# Patient Record
Sex: Male | Born: 1940
Health system: Southern US, Community
[De-identification: ages and names within clinical notes are randomized; demographics above are authoritative.]

## PROBLEM LIST (undated history)

## (undated) DIAGNOSIS — C19 Malignant neoplasm of rectosigmoid junction: Secondary | ICD-10-CM

## (undated) DIAGNOSIS — E7800 Pure hypercholesterolemia, unspecified: Secondary | ICD-10-CM

## (undated) DIAGNOSIS — C189 Malignant neoplasm of colon, unspecified: Secondary | ICD-10-CM

## (undated) DIAGNOSIS — C9110 Chronic lymphocytic leukemia of B-cell type not having achieved remission: Secondary | ICD-10-CM

## (undated) DIAGNOSIS — J189 Pneumonia, unspecified organism: Secondary | ICD-10-CM

## (undated) DIAGNOSIS — I10 Essential (primary) hypertension: Secondary | ICD-10-CM

## (undated) HISTORY — PX: HEMICOLECTOMY: SHX854

## (undated) HISTORY — DX: Pneumonia, unspecified organism: J18.9

## (undated) HISTORY — DX: Pure hypercholesterolemia, unspecified: E78.00

## (undated) HISTORY — PX: OTHER SURGICAL HISTORY: SHX169

## (undated) HISTORY — DX: Essential (primary) hypertension: I10

## (undated) HISTORY — DX: Malignant neoplasm of colon, unspecified: C18.9

---

## 2007-11-17 ENCOUNTER — Encounter: Admission: RE | Admit: 2007-11-17 | Discharge: 2007-11-17 | Payer: Self-pay | Admitting: Gastroenterology

## 2007-11-23 ENCOUNTER — Ambulatory Visit (HOSPITAL_COMMUNITY): Admission: RE | Admit: 2007-11-23 | Discharge: 2007-11-23 | Payer: Self-pay | Admitting: Gastroenterology

## 2007-11-26 ENCOUNTER — Inpatient Hospital Stay (HOSPITAL_COMMUNITY): Admission: EM | Admit: 2007-11-26 | Discharge: 2007-11-29 | Payer: Self-pay | Admitting: Emergency Medicine

## 2007-12-19 ENCOUNTER — Ambulatory Visit (HOSPITAL_COMMUNITY): Admission: RE | Admit: 2007-12-19 | Discharge: 2007-12-19 | Payer: Self-pay | Admitting: Family Medicine

## 2008-01-09 ENCOUNTER — Inpatient Hospital Stay (HOSPITAL_COMMUNITY): Admission: RE | Admit: 2008-01-09 | Discharge: 2008-01-13 | Payer: Self-pay | Admitting: General Surgery

## 2008-01-09 ENCOUNTER — Encounter (INDEPENDENT_AMBULATORY_CARE_PROVIDER_SITE_OTHER): Payer: Self-pay | Admitting: General Surgery

## 2008-01-16 ENCOUNTER — Ambulatory Visit: Payer: Self-pay | Admitting: Hematology and Oncology

## 2008-02-02 LAB — COMPREHENSIVE METABOLIC PANEL
ALT: 11 U/L (ref 0–53)
AST: 12 U/L (ref 0–37)
Albumin: 4.4 g/dL (ref 3.5–5.2)
Alkaline Phosphatase: 62 U/L (ref 39–117)
BUN: 13 mg/dL (ref 6–23)
CO2: 26 mEq/L (ref 19–32)
Calcium: 9.1 mg/dL (ref 8.4–10.5)
Chloride: 107 mEq/L (ref 96–112)
Creatinine, Ser: 0.93 mg/dL (ref 0.40–1.50)
Glucose, Bld: 105 mg/dL — ABNORMAL HIGH (ref 70–99)
Potassium: 4.1 mEq/L (ref 3.5–5.3)
Sodium: 142 mEq/L (ref 135–145)
Total Bilirubin: 1.3 mg/dL — ABNORMAL HIGH (ref 0.3–1.2)
Total Protein: 6.8 g/dL (ref 6.0–8.3)

## 2008-02-02 LAB — CBC WITH DIFFERENTIAL/PLATELET
BASO%: 0.2 % (ref 0.0–2.0)
Basophils Absolute: 0 10*3/uL (ref 0.0–0.1)
EOS%: 3.1 % (ref 0.0–7.0)
Eosinophils Absolute: 0.1 10*3/uL (ref 0.0–0.5)
HCT: 38.4 % — ABNORMAL LOW (ref 38.7–49.9)
HGB: 13.3 g/dL (ref 13.0–17.1)
LYMPH%: 43 % (ref 14.0–48.0)
MCH: 29.1 pg (ref 28.0–33.4)
MCHC: 34.6 g/dL (ref 32.0–35.9)
MCV: 84.1 fL (ref 81.6–98.0)
MONO#: 0.5 10*3/uL (ref 0.1–0.9)
MONO%: 9.8 % (ref 0.0–13.0)
NEUT#: 2 10*3/uL (ref 1.5–6.5)
NEUT%: 43.9 % (ref 40.0–75.0)
Platelets: 124 10*3/uL — ABNORMAL LOW (ref 145–400)
RBC: 4.56 10*6/uL (ref 4.20–5.71)
RDW: 13.7 % (ref 11.2–14.6)
WBC: 4.6 10*3/uL (ref 4.0–10.0)
lymph#: 2 10*3/uL (ref 0.9–3.3)

## 2008-02-02 LAB — CEA: CEA: 0.8 ng/mL (ref 0.0–5.0)

## 2008-07-30 ENCOUNTER — Ambulatory Visit: Payer: Self-pay | Admitting: Hematology and Oncology

## 2008-08-01 ENCOUNTER — Ambulatory Visit (HOSPITAL_COMMUNITY): Admission: RE | Admit: 2008-08-01 | Discharge: 2008-08-01 | Payer: Self-pay | Admitting: Hematology and Oncology

## 2008-08-01 LAB — CBC WITH DIFFERENTIAL/PLATELET
BASO%: 1.1 % (ref 0.0–2.0)
Basophils Absolute: 0.1 10*3/uL (ref 0.0–0.1)
EOS%: 1.9 % (ref 0.0–7.0)
Eosinophils Absolute: 0.1 10*3/uL (ref 0.0–0.5)
HCT: 42.7 % (ref 38.7–49.9)
HGB: 14.6 g/dL (ref 13.0–17.1)
LYMPH%: 53.2 % — ABNORMAL HIGH (ref 14.0–48.0)
MCH: 29.4 pg (ref 28.0–33.4)
MCHC: 34.3 g/dL (ref 32.0–35.9)
MCV: 85.9 fL (ref 81.6–98.0)
MONO#: 0.4 10*3/uL (ref 0.1–0.9)
MONO%: 6.6 % (ref 0.0–13.0)
NEUT#: 2.3 10*3/uL (ref 1.5–6.5)
NEUT%: 37.2 % — ABNORMAL LOW (ref 40.0–75.0)
Platelets: 154 10*3/uL (ref 145–400)
RBC: 4.97 10*6/uL (ref 4.20–5.71)
RDW: 13.9 % (ref 11.2–14.6)
WBC: 6.3 10*3/uL (ref 4.0–10.0)
lymph#: 3.3 10*3/uL (ref 0.9–3.3)

## 2008-08-01 LAB — COMPREHENSIVE METABOLIC PANEL
ALT: 16 U/L (ref 0–53)
AST: 21 U/L (ref 0–37)
Albumin: 4.3 g/dL (ref 3.5–5.2)
Alkaline Phosphatase: 67 U/L (ref 39–117)
BUN: 15 mg/dL (ref 6–23)
CO2: 31 mEq/L (ref 19–32)
Calcium: 9.8 mg/dL (ref 8.4–10.5)
Chloride: 103 mEq/L (ref 96–112)
Creatinine, Ser: 1.2 mg/dL (ref 0.40–1.50)
Glucose, Bld: 103 mg/dL — ABNORMAL HIGH (ref 70–99)
Potassium: 4.4 mEq/L (ref 3.5–5.3)
Sodium: 140 mEq/L (ref 135–145)
Total Bilirubin: 1.4 mg/dL — ABNORMAL HIGH (ref 0.3–1.2)
Total Protein: 7.2 g/dL (ref 6.0–8.3)

## 2008-08-01 LAB — CEA: CEA: 1.3 ng/mL (ref 0.0–5.0)

## 2009-01-28 ENCOUNTER — Ambulatory Visit: Payer: Self-pay | Admitting: Hematology and Oncology

## 2009-01-30 ENCOUNTER — Ambulatory Visit (HOSPITAL_COMMUNITY): Admission: RE | Admit: 2009-01-30 | Discharge: 2009-01-30 | Payer: Self-pay | Admitting: Hematology and Oncology

## 2009-01-30 LAB — CBC WITH DIFFERENTIAL/PLATELET
BASO%: 0.6 % (ref 0.0–2.0)
Basophils Absolute: 0 10*3/uL (ref 0.0–0.1)
EOS%: 3.4 % (ref 0.0–7.0)
Eosinophils Absolute: 0.2 10*3/uL (ref 0.0–0.5)
HCT: 41.8 % (ref 38.4–49.9)
HGB: 14.2 g/dL (ref 13.0–17.1)
LYMPH%: 45.5 % (ref 14.0–49.0)
MCH: 29.8 pg (ref 27.2–33.4)
MCHC: 33.9 g/dL (ref 32.0–36.0)
MCV: 87.9 fL (ref 79.3–98.0)
MONO#: 0.4 10*3/uL (ref 0.1–0.9)
MONO%: 7 % (ref 0.0–14.0)
NEUT#: 2.7 10*3/uL (ref 1.5–6.5)
NEUT%: 43.5 % (ref 39.0–75.0)
Platelets: 118 10*3/uL — ABNORMAL LOW (ref 140–400)
RBC: 4.76 10*6/uL (ref 4.20–5.82)
RDW: 13.4 % (ref 11.0–14.6)
WBC: 6.1 10*3/uL (ref 4.0–10.3)
lymph#: 2.8 10*3/uL (ref 0.9–3.3)

## 2009-01-30 LAB — CEA: CEA: 1 ng/mL (ref 0.0–5.0)

## 2009-01-30 LAB — COMPREHENSIVE METABOLIC PANEL
ALT: 16 U/L (ref 0–53)
AST: 19 U/L (ref 0–37)
Albumin: 4.3 g/dL (ref 3.5–5.2)
Alkaline Phosphatase: 62 U/L (ref 39–117)
BUN: 19 mg/dL (ref 6–23)
CO2: 29 mEq/L (ref 19–32)
Calcium: 9.3 mg/dL (ref 8.4–10.5)
Chloride: 103 mEq/L (ref 96–112)
Creatinine, Ser: 1.03 mg/dL (ref 0.40–1.50)
Glucose, Bld: 132 mg/dL — ABNORMAL HIGH (ref 70–99)
Potassium: 4 mEq/L (ref 3.5–5.3)
Sodium: 139 mEq/L (ref 135–145)
Total Bilirubin: 1.4 mg/dL — ABNORMAL HIGH (ref 0.3–1.2)
Total Protein: 7.1 g/dL (ref 6.0–8.3)

## 2009-05-06 ENCOUNTER — Ambulatory Visit: Payer: Self-pay | Admitting: Hematology and Oncology

## 2009-05-08 LAB — CBC WITH DIFFERENTIAL/PLATELET
BASO%: 0.2 % (ref 0.0–2.0)
Basophils Absolute: 0 10*3/uL (ref 0.0–0.1)
EOS%: 3 % (ref 0.0–7.0)
Eosinophils Absolute: 0.2 10*3/uL (ref 0.0–0.5)
HCT: 40.2 % (ref 38.4–49.9)
HGB: 13.7 g/dL (ref 13.0–17.1)
LYMPH%: 59.7 % — ABNORMAL HIGH (ref 14.0–49.0)
MCH: 30 pg (ref 27.2–33.4)
MCHC: 34.2 g/dL (ref 32.0–36.0)
MCV: 87.9 fL (ref 79.3–98.0)
MONO#: 0.5 10*3/uL (ref 0.1–0.9)
MONO%: 8.3 % (ref 0.0–14.0)
NEUT#: 1.9 10*3/uL (ref 1.5–6.5)
NEUT%: 28.8 % — ABNORMAL LOW (ref 39.0–75.0)
Platelets: 93 10*3/uL — ABNORMAL LOW (ref 140–400)
RBC: 4.57 10*6/uL (ref 4.20–5.82)
RDW: 14.1 % (ref 11.0–14.6)
WBC: 6.6 10*3/uL (ref 4.0–10.3)
lymph#: 3.9 10*3/uL — ABNORMAL HIGH (ref 0.9–3.3)

## 2009-05-08 LAB — TECHNOLOGIST REVIEW

## 2009-05-16 ENCOUNTER — Encounter: Payer: Self-pay | Admitting: Hematology and Oncology

## 2009-05-16 ENCOUNTER — Ambulatory Visit (HOSPITAL_COMMUNITY): Admission: RE | Admit: 2009-05-16 | Discharge: 2009-05-16 | Payer: Self-pay | Admitting: Hematology and Oncology

## 2009-05-16 LAB — COMPREHENSIVE METABOLIC PANEL
ALT: 16 U/L (ref 0–53)
AST: 20 U/L (ref 0–37)
Albumin: 4.4 g/dL (ref 3.5–5.2)
Alkaline Phosphatase: 58 U/L (ref 39–117)
BUN: 15 mg/dL (ref 6–23)
CO2: 29 mEq/L (ref 19–32)
Calcium: 9.4 mg/dL (ref 8.4–10.5)
Chloride: 103 mEq/L (ref 96–112)
Creatinine, Ser: 1.11 mg/dL (ref 0.40–1.50)
Glucose, Bld: 95 mg/dL (ref 70–99)
Potassium: 4 mEq/L (ref 3.5–5.3)
Sodium: 140 mEq/L (ref 135–145)
Total Bilirubin: 1.5 mg/dL — ABNORMAL HIGH (ref 0.3–1.2)
Total Protein: 6.9 g/dL (ref 6.0–8.3)

## 2009-05-16 LAB — LACTATE DEHYDROGENASE: LDH: 166 U/L (ref 94–250)

## 2009-05-20 LAB — VITAMIN B12: Vitamin B-12: 556 pg/mL (ref 211–911)

## 2009-05-20 LAB — PROTEIN ELECTROPHORESIS, SERUM
Albumin ELP: 63.3 % (ref 55.8–66.1)
Alpha-1-Globulin: 3.4 % (ref 2.9–4.9)
Alpha-2-Globulin: 8.2 % (ref 7.1–11.8)
Beta 2: 3.6 % (ref 3.2–6.5)
Beta Globulin: 6.2 % (ref 4.7–7.2)
Gamma Globulin: 15.3 % (ref 11.1–18.8)
Total Protein, Serum Electrophoresis: 6.8 g/dL (ref 6.0–8.3)

## 2009-05-20 LAB — FOLATE: Folate: >20 ng/mL

## 2009-05-27 LAB — FLOW CYTOMETRY

## 2009-05-28 LAB — CBC WITH DIFFERENTIAL/PLATELET
BASO%: 0.4 % (ref 0.0–2.0)
Basophils Absolute: 0 10*3/uL (ref 0.0–0.1)
EOS%: 3.4 % (ref 0.0–7.0)
Eosinophils Absolute: 0.2 10*3/uL (ref 0.0–0.5)
HCT: 40.9 % (ref 38.4–49.9)
HGB: 14 g/dL (ref 13.0–17.1)
LYMPH%: 39.8 % (ref 14.0–49.0)
MCH: 29.8 pg (ref 27.2–33.4)
MCHC: 34.2 g/dL (ref 32.0–36.0)
MCV: 87.2 fL (ref 79.3–98.0)
MONO#: 0.6 10*3/uL (ref 0.1–0.9)
MONO%: 9.4 % (ref 0.0–14.0)
NEUT#: 2.8 10*3/uL (ref 1.5–6.5)
NEUT%: 47 % (ref 39.0–75.0)
Platelets: 124 10*3/uL — ABNORMAL LOW (ref 140–400)
RBC: 4.69 10*6/uL (ref 4.20–5.82)
RDW: 13.6 % (ref 11.0–14.6)
WBC: 6 10*3/uL (ref 4.0–10.3)
lymph#: 2.4 10*3/uL (ref 0.9–3.3)

## 2009-05-28 LAB — CEA: CEA: 1.6 ng/mL (ref 0.0–5.0)

## 2009-11-18 ENCOUNTER — Ambulatory Visit: Payer: Self-pay | Admitting: Hematology and Oncology

## 2009-11-20 ENCOUNTER — Ambulatory Visit (HOSPITAL_COMMUNITY): Admission: RE | Admit: 2009-11-20 | Discharge: 2009-11-20 | Payer: Self-pay | Admitting: Hematology and Oncology

## 2009-11-20 LAB — COMPREHENSIVE METABOLIC PANEL
ALT: 22 U/L (ref 0–53)
AST: 33 U/L (ref 0–37)
Albumin: 4.7 g/dL (ref 3.5–5.2)
Alkaline Phosphatase: 62 U/L (ref 39–117)
BUN: 12 mg/dL (ref 6–23)
CO2: 31 mEq/L (ref 19–32)
Calcium: 9.4 mg/dL (ref 8.4–10.5)
Chloride: 104 mEq/L (ref 96–112)
Creatinine, Ser: 1.13 mg/dL (ref 0.40–1.50)
Glucose, Bld: 94 mg/dL (ref 70–99)
Potassium: 3.5 mEq/L (ref 3.5–5.3)
Sodium: 140 mEq/L (ref 135–145)
Total Bilirubin: 1.2 mg/dL (ref 0.3–1.2)
Total Protein: 7.3 g/dL (ref 6.0–8.3)

## 2009-11-20 LAB — CBC WITH DIFFERENTIAL/PLATELET
BASO%: 0.2 % (ref 0.0–2.0)
Basophils Absolute: 0 10*3/uL (ref 0.0–0.1)
EOS%: 3.2 % (ref 0.0–7.0)
Eosinophils Absolute: 0.2 10*3/uL (ref 0.0–0.5)
HCT: 41.2 % (ref 38.4–49.9)
HGB: 14.2 g/dL (ref 13.0–17.1)
LYMPH%: 54.8 % — ABNORMAL HIGH (ref 14.0–49.0)
MCH: 30.4 pg (ref 27.2–33.4)
MCHC: 34.4 g/dL (ref 32.0–36.0)
MCV: 88.5 fL (ref 79.3–98.0)
MONO#: 0.5 10*3/uL (ref 0.1–0.9)
MONO%: 7.8 % (ref 0.0–14.0)
NEUT#: 2.3 10*3/uL (ref 1.5–6.5)
NEUT%: 34 % — ABNORMAL LOW (ref 39.0–75.0)
Platelets: 104 10*3/uL — ABNORMAL LOW (ref 140–400)
RBC: 4.66 10*6/uL (ref 4.20–5.82)
RDW: 14.3 % (ref 11.0–14.6)
WBC: 6.7 10*3/uL (ref 4.0–10.3)
lymph#: 3.6 10*3/uL — ABNORMAL HIGH (ref 0.9–3.3)

## 2009-11-20 LAB — CEA: CEA: 1.6 ng/mL (ref 0.0–5.0)

## 2009-11-28 ENCOUNTER — Other Ambulatory Visit: Admission: RE | Admit: 2009-11-28 | Discharge: 2009-11-28 | Payer: Self-pay | Admitting: Hematology and Oncology

## 2009-11-29 ENCOUNTER — Ambulatory Visit (HOSPITAL_COMMUNITY): Admission: RE | Admit: 2009-11-29 | Discharge: 2009-11-29 | Payer: Self-pay | Admitting: Hematology and Oncology

## 2009-12-02 LAB — SPEP & IFE WITH QIG
Albumin ELP: 63.1 % (ref 55.8–66.1)
Alpha-1-Globulin: 5.2 % — ABNORMAL HIGH (ref 2.9–4.9)
Alpha-2-Globulin: 7.4 % (ref 7.1–11.8)
Beta 2: 3 % — ABNORMAL LOW (ref 3.2–6.5)
Beta Globulin: 6.3 % (ref 4.7–7.2)
Gamma Globulin: 15 % (ref 11.1–18.8)
IgA: 138 mg/dL (ref 68–378)
IgG (Immunoglobin G), Serum: 1120 mg/dL (ref 694–1618)
IgM, Serum: 43 mg/dL — ABNORMAL LOW (ref 60–263)
Total Protein, Serum Electrophoresis: 7.2 g/dL (ref 6.0–8.3)

## 2009-12-02 LAB — VITAMIN B12: Vitamin B-12: 524 pg/mL (ref 211–911)

## 2009-12-02 LAB — DIRECT ANTIGLOBULIN TEST (NOT AT ARMC)
DAT (Complement): NEGATIVE
DAT IgG: NEGATIVE

## 2010-03-04 ENCOUNTER — Ambulatory Visit: Payer: Self-pay | Admitting: Hematology and Oncology

## 2010-03-06 ENCOUNTER — Ambulatory Visit (HOSPITAL_COMMUNITY): Admission: RE | Admit: 2010-03-06 | Discharge: 2010-03-06 | Payer: Self-pay | Admitting: Hematology and Oncology

## 2010-03-06 LAB — COMPREHENSIVE METABOLIC PANEL
ALT: 17 U/L (ref 0–53)
AST: 24 U/L (ref 0–37)
Albumin: 4.6 g/dL (ref 3.5–5.2)
Alkaline Phosphatase: 76 U/L (ref 39–117)
BUN: 14 mg/dL (ref 6–23)
CO2: 29 mEq/L (ref 19–32)
Calcium: 9.4 mg/dL (ref 8.4–10.5)
Chloride: 104 mEq/L (ref 96–112)
Creatinine, Ser: 1.15 mg/dL (ref 0.40–1.50)
Glucose, Bld: 114 mg/dL — ABNORMAL HIGH (ref 70–99)
Potassium: 4.3 mEq/L (ref 3.5–5.3)
Sodium: 138 mEq/L (ref 135–145)
Total Bilirubin: 1.4 mg/dL — ABNORMAL HIGH (ref 0.3–1.2)
Total Protein: 7.6 g/dL (ref 6.0–8.3)

## 2010-03-06 LAB — CBC WITH DIFFERENTIAL/PLATELET
BASO%: 0.1 % (ref 0.0–2.0)
Basophils Absolute: 0 10*3/uL (ref 0.0–0.1)
EOS%: 2.2 % (ref 0.0–7.0)
Eosinophils Absolute: 0.2 10*3/uL (ref 0.0–0.5)
HCT: 41.4 % (ref 38.4–49.9)
HGB: 13.9 g/dL (ref 13.0–17.1)
LYMPH%: 64.6 % — ABNORMAL HIGH (ref 14.0–49.0)
MCH: 29.5 pg (ref 27.2–33.4)
MCHC: 33.5 g/dL (ref 32.0–36.0)
MCV: 88 fL (ref 79.3–98.0)
MONO#: 0.6 10*3/uL (ref 0.1–0.9)
MONO%: 6 % (ref 0.0–14.0)
NEUT#: 2.5 10*3/uL (ref 1.5–6.5)
NEUT%: 27.1 % — ABNORMAL LOW (ref 39.0–75.0)
Platelets: 130 10*3/uL — ABNORMAL LOW (ref 140–400)
RBC: 4.7 10*6/uL (ref 4.20–5.82)
RDW: 14.3 % (ref 11.0–14.6)
WBC: 9.3 10*3/uL (ref 4.0–10.3)
lymph#: 6 10*3/uL — ABNORMAL HIGH (ref 0.9–3.3)

## 2010-03-06 LAB — LACTATE DEHYDROGENASE: LDH: 205 U/L (ref 94–250)

## 2010-03-06 LAB — URIC ACID: Uric Acid, Serum: 5.2 mg/dL (ref 4.0–7.8)

## 2010-06-12 ENCOUNTER — Ambulatory Visit: Payer: Self-pay | Admitting: Hematology and Oncology

## 2010-06-16 ENCOUNTER — Ambulatory Visit (HOSPITAL_COMMUNITY): Admission: RE | Admit: 2010-06-16 | Discharge: 2010-06-16 | Payer: Self-pay | Admitting: Hematology and Oncology

## 2010-06-16 LAB — CBC WITH DIFFERENTIAL/PLATELET
BASO%: 0.3 % (ref 0.0–2.0)
Basophils Absolute: 0 10*3/uL (ref 0.0–0.1)
EOS%: 2.5 % (ref 0.0–7.0)
Eosinophils Absolute: 0.3 10*3/uL (ref 0.0–0.5)
HCT: 44.1 % (ref 38.4–49.9)
HGB: 15 g/dL (ref 13.0–17.1)
LYMPH%: 67.7 % — ABNORMAL HIGH (ref 14.0–49.0)
MCH: 29.5 pg (ref 27.2–33.4)
MCHC: 34 g/dL (ref 32.0–36.0)
MCV: 86.8 fL (ref 79.3–98.0)
MONO#: 0.4 10*3/uL (ref 0.1–0.9)
MONO%: 3.9 % (ref 0.0–14.0)
NEUT#: 2.8 10*3/uL (ref 1.5–6.5)
NEUT%: 25.6 % — ABNORMAL LOW (ref 39.0–75.0)
Platelets: 106 10*3/uL — ABNORMAL LOW (ref 140–400)
RBC: 5.08 10*6/uL (ref 4.20–5.82)
RDW: 13.3 % (ref 11.0–14.6)
WBC: 10.9 10*3/uL — ABNORMAL HIGH (ref 4.0–10.3)
lymph#: 7.4 10*3/uL — ABNORMAL HIGH (ref 0.9–3.3)

## 2010-06-24 LAB — COMPREHENSIVE METABOLIC PANEL
ALT: 14 U/L (ref 0–53)
AST: 15 U/L (ref 0–37)
Albumin: 4.2 g/dL (ref 3.5–5.2)
Alkaline Phosphatase: 74 U/L (ref 39–117)
BUN: 14 mg/dL (ref 6–23)
CO2: 26 mEq/L (ref 19–32)
Calcium: 9.1 mg/dL (ref 8.4–10.5)
Chloride: 105 mEq/L (ref 96–112)
Creatinine, Ser: 1.02 mg/dL (ref 0.40–1.50)
Glucose, Bld: 108 mg/dL — ABNORMAL HIGH (ref 70–99)
Potassium: 4.2 mEq/L (ref 3.5–5.3)
Sodium: 140 mEq/L (ref 135–145)
Total Bilirubin: 1.1 mg/dL (ref 0.3–1.2)
Total Protein: 6.8 g/dL (ref 6.0–8.3)

## 2010-06-24 LAB — CBC WITH DIFFERENTIAL/PLATELET
BASO%: 0.6 % (ref 0.0–2.0)
Basophils Absolute: 0.1 10*3/uL (ref 0.0–0.1)
EOS%: 2.8 % (ref 0.0–7.0)
Eosinophils Absolute: 0.3 10*3/uL (ref 0.0–0.5)
HCT: 39.9 % (ref 38.4–49.9)
HGB: 13.6 g/dL (ref 13.0–17.1)
LYMPH%: 65.4 % — ABNORMAL HIGH (ref 14.0–49.0)
MCH: 29.5 pg (ref 27.2–33.4)
MCHC: 34.2 g/dL (ref 32.0–36.0)
MCV: 86.3 fL (ref 79.3–98.0)
MONO#: 0.7 10*3/uL (ref 0.1–0.9)
MONO%: 7.2 % (ref 0.0–14.0)
NEUT#: 2.2 10*3/uL (ref 1.5–6.5)
NEUT%: 24 % — ABNORMAL LOW (ref 39.0–75.0)
Platelets: 101 10*3/uL — ABNORMAL LOW (ref 140–400)
RBC: 4.62 10*6/uL (ref 4.20–5.82)
RDW: 13.4 % (ref 11.0–14.6)
WBC: 9.3 10*3/uL (ref 4.0–10.3)
lymph#: 6.1 10*3/uL — ABNORMAL HIGH (ref 0.9–3.3)

## 2010-06-24 LAB — IGG, IGA, IGM
IgA: 233 mg/dL (ref 68–378)
IgG (Immunoglobin G), Serum: 1370 mg/dL (ref 694–1618)
IgM, Serum: 63 mg/dL (ref 60–263)

## 2010-06-24 LAB — LACTATE DEHYDROGENASE: LDH: 171 U/L (ref 94–250)

## 2010-10-16 ENCOUNTER — Ambulatory Visit: Payer: Self-pay | Admitting: Hematology and Oncology

## 2010-10-19 ENCOUNTER — Encounter: Payer: Self-pay | Admitting: Hematology and Oncology

## 2010-10-20 LAB — CBC WITH DIFFERENTIAL/PLATELET
BASO%: 0.5 % (ref 0.0–2.0)
Basophils Absolute: 0.1 10*3/uL (ref 0.0–0.1)
EOS%: 1.2 % (ref 0.0–7.0)
Eosinophils Absolute: 0.2 10*3/uL (ref 0.0–0.5)
HCT: 42.8 % (ref 38.4–49.9)
HGB: 14.7 g/dL (ref 13.0–17.1)
LYMPH%: 70.5 % — ABNORMAL HIGH (ref 14.0–49.0)
MCH: 30 pg (ref 27.2–33.4)
MCHC: 34.3 g/dL (ref 32.0–36.0)
MCV: 87.4 fL (ref 79.3–98.0)
MONO#: 1.2 10*3/uL — ABNORMAL HIGH (ref 0.1–0.9)
MONO%: 7.7 % (ref 0.0–14.0)
NEUT#: 3.2 10*3/uL (ref 1.5–6.5)
NEUT%: 20.1 % — ABNORMAL LOW (ref 39.0–75.0)
Platelets: 102 10*3/uL — ABNORMAL LOW (ref 140–400)
RBC: 4.9 10*6/uL (ref 4.20–5.82)
RDW: 13.9 % (ref 11.0–14.6)
WBC: 15.8 10*3/uL — ABNORMAL HIGH (ref 4.0–10.3)
lymph#: 11.1 10*3/uL — ABNORMAL HIGH (ref 0.9–3.3)

## 2010-10-20 LAB — TECHNOLOGIST REVIEW

## 2010-10-21 LAB — COMPREHENSIVE METABOLIC PANEL
ALT: 11 U/L (ref 0–53)
AST: 18 U/L (ref 0–37)
Albumin: 4.8 g/dL (ref 3.5–5.2)
Alkaline Phosphatase: 79 U/L (ref 39–117)
BUN: 14 mg/dL (ref 6–23)
CO2: 24 mEq/L (ref 19–32)
Calcium: 9.6 mg/dL (ref 8.4–10.5)
Chloride: 105 mEq/L (ref 96–112)
Creatinine, Ser: 1.09 mg/dL (ref 0.40–1.50)
Glucose, Bld: 132 mg/dL — ABNORMAL HIGH (ref 70–99)
Potassium: 4.1 mEq/L (ref 3.5–5.3)
Sodium: 141 mEq/L (ref 135–145)
Total Bilirubin: 1.1 mg/dL (ref 0.3–1.2)
Total Protein: 7.5 g/dL (ref 6.0–8.3)

## 2010-10-21 LAB — LACTATE DEHYDROGENASE: LDH: 182 U/L (ref 94–250)

## 2010-10-22 ENCOUNTER — Other Ambulatory Visit: Payer: Self-pay | Admitting: Hematology and Oncology

## 2010-10-22 DIAGNOSIS — C189 Malignant neoplasm of colon, unspecified: Secondary | ICD-10-CM

## 2010-12-11 LAB — POCT I-STAT, CHEM 8
BUN: 16 mg/dL (ref 6–23)
Calcium, Ion: 1.19 mmol/L (ref 1.12–1.32)
Chloride: 102 mEq/L (ref 96–112)
Creatinine, Ser: 1.1 mg/dL (ref 0.4–1.5)
Glucose, Bld: 109 mg/dL — ABNORMAL HIGH (ref 70–99)
HCT: 48 % (ref 39.0–52.0)
Hemoglobin: 16.3 g/dL (ref 13.0–17.0)
Potassium: 4.1 mEq/L (ref 3.5–5.1)
Sodium: 141 mEq/L (ref 135–145)
TCO2: 29 mmol/L (ref 0–100)

## 2010-12-21 LAB — CHROMOSOME ANALYSIS, BONE MARROW

## 2010-12-21 LAB — TISSUE HYBRIDIZATION (BONE MARROW)-NCBH

## 2010-12-21 LAB — GLUCOSE, CAPILLARY: Glucose-Capillary: 113 mg/dL — ABNORMAL HIGH (ref 70–99)

## 2010-12-21 LAB — BONE MARROW EXAM

## 2011-02-10 NOTE — Discharge Summary (Signed)
[name], [name]             ACCOUNT NO.:  [id number]   MEDICAL RECORD NO.:  [id number]          PATIENT TYPE:  INP   LOCATION:  [id number]                         FACILITY:  [facility]   PHYSICIAN:  Sheila Oats, M.D.DATE OF BIRTH:  01/17/1941   DATE OF ADMISSION:  11/25/2007  DATE OF DISCHARGE:  11/29/2007                               DISCHARGE SUMMARY   DISCHARGE DIAGNOSES:  1. Bilateral pneumonia.  2. Hypokalemia.  3. Colon cancer, newly diagnosed, patient to follow up with oncologist      as scheduled.  4. Diabetes mellitus.  5. Hypertension.  6. Hypercholesterolemia.   PROCEDURES AND STUDIES:  CT angiogram of chest:  Extensive right basilar  air space disease most consistent with pneumonia.  Somewhat technically  limited study demonstrating no evidence of pulmonary embolus.   BRIEF HISTORY:  The patient is a 70 year old black male with the above-  listed medical problems, who presented with fevers, chills and decreased  energy levels.  He had an episode of nausea and vomiting on the day of  presentation.  In the emergency room his initial temperature was 102.5  with a blood pressure of 149/63, O2 saturations of 99%, white cell count  of 5with 83% neutrophils and a potassium of 3.4.  He admitted to a  nonproductive cough.  He denied chest pain.   Please see the full admission history and physical dictated on November 26, 2007, by Dr. Delfina Redwood for the details of the admission physical exam  as well as the laboratory data.   HOSPITAL COURSE:  1. Bilateral pneumonia.  Upon admission the patient had a chest x-ray,      which showed very low lung volumes causing vascular crowding and      mild right basilar atelectasis.  Given his history of cough along      with the fevers, he was empirically started on IV antibiotics for      pneumonia.  A D-dimer was also done and this was elevated at 0.55.      following this a CT angiogram of his chest was done to evaluate for  pulmonary embolus and the results, as stated above, revealed      bibasilar pneumonia and was negative for PE.  The patient was      placed on expectorants/antitussives and supplemental oxygen in the      hospital.  With the Mucinex his cough became productive, he      defervesced, and he has continued to have no leukocytosis.  His      symptoms have improved and he has remained hemodynamically stable      and oxygenating well on room air.  He will be discharged on oral      antibiotics and he is to follow up with his primary care physician.  2. Hypokalemia.  his potassium was replaced in the hospital.  3. Diabetes mellitus.  He is to continue his outpatient medications      upon discharge.  4. Hypertension.  He was maintained on his preadmission medications      and is to continue them upon discharge.  5. Hyperlipidemia.  The patient is to continue his outpatient      medications upon discharge.  6. Recent colon cancer diagnosis.  The patient is to follow up with      his oncologist upon discharge as scheduled.   DISCHARGE MEDICATIONS:  1. Avelox 400 mg 1 tablet daily.  2. Tussionex 5 mL every 12 hours as needed only for cough.  3. Mucinex-DM 2 tablets b.i.d.  4. Patient to continue metformin, Actos, amlodipine, Januvia, Vytorin      and aspirin.   FOLLOW-UP CARE:  1. Dr. [name] in 1-2 weeks.  2. Oncologist as scheduled.   DISCHARGE CONDITION:  Improved/stable.      Sheila Oats, M.D.  Electronically Signed     ACV/MEDQ  D:  11/29/2007  T:  11/29/2007  Job:  [id number]   cc:   [name] A. Alyson Ingles, M.D.  Fax: [phone]

## 2011-02-10 NOTE — Op Note (Signed)
NAME:  [name], [name]             ACCOUNT NO.:  [id number]   MEDICAL RECORD NO.:  [id number]          PATIENT TYPE:  INP   LOCATION:  0001                         FACILITY:  [facility]   PHYSICIAN:  Edsel Petrin. [name], M.D.DATE OF BIRTH:  08/15/1941   DATE OF PROCEDURE:  01/09/2008  DATE OF DISCHARGE:                               OPERATIVE REPORT   PREOPERATIVE DIAGNOSIS:  Carcinoma of the proximal rectum.   POSTOPERATIVE DIAGNOSIS:  Carcinoma of the rectosigmoid junction.   OPERATION PERFORMED:  Rigid proctoscopy, low anterior resection with  coloproctostomy (29 mm EEA stapler.)   SURGEON:  Fanny Skates, M.D.   FIRST ASSISTANT:  Alphonsa Overall, M.D.   OPERATIVE INDICATIONS:  This is a 70 year old black man who had routine  screening colonoscopy on November 07, 2007.  Dr. Oletta Lamas found a  noncircumferential mass with the lower edge of the mass reported at 18  cm and measuring 25 mm in diameter.  This was noncircumferential.  Biopsy showed adenocarcinoma.  A CT scan showed some enlarged small  bowel lymph nodes and some tiny liver spots, but a PET CT scan showed no  abnormal enhancement.  Unexpectedly the patient develops bilateral  pneumonia and was hospitalized on November 25, 2007 with fever.  CT  angiography of the chest was negative for pulmonary embolism.  He  recovered from that and was followed by Dr. [name] until his  pulmonary function normalized and his chest x-ray cleared.  Once this  was done, we scheduled him for surgery.  He has undergone  2-day bowel  prep at home and that went fairly well.  He has been counseled twice as  an outpatient and is in full agreement with the plan.   OPERATIVE FINDINGS:  Initial rigid proctoscopy showed a  noncircumferential neoplastic mass on the rectal wall at the right  anterior position.  This initially looked to be at about 15 cm.  Once we  did the laparotomy, the mass appeared somewhat higher well above the  peritoneal  reflection.  Once we were able to stretch the colon out  completely Dr. Lucia Gaskins performed a rigid proctoscopy which showed the  mass to be somewhere between 20 and 25 cm.  There were some small soft  lymph nodes throughout the mesentery, but nothing that I would  characterizes pathologic.  The liver felt normal.  The gallbladder felt  normal.  The omentum felt normal.  There was no ascites.   OPERATIVE TECHNIQUE:  Following the induction of general endotracheal  anesthesia, intravenous antibiotics were given.  The patient was  identified as the correct patient and the correct procedure.  The  patient underwent insertion of a Foley catheter.  He was then placed in  rigid stirrups.  Rigid proctoscopy was carried out and I felt the tumor  was at about 15 cm on the left anterior wall of the rectum.  We then  prepped and draped the abdomen and perineum.  Lower midline incision was  made.  The fascia was incised in the midline.  The abdomen was entered  and explored with findings as described above.  We  placed a silk suture  in the small palpable mass in the rectosigmoid junction.  To be sure  that we had not missed another lesion, Dr. Lucia Gaskins went below and  performed another rigid proctoscopy all the way up to the mass and with  the colon on stretch it was over 20 cm and almost 25 cm away.  There  were no other lesions.   We packed away the small bowel and placed self-retaining retractors.  We  mobilized the sigmoid colon by dividing its lateral peritoneal  attachments.  We clearly identified both ureters and these were  preserved.   We created a window behind the proximal rectum about 4 to 5 cm distal to  the tumor.  I placed a contour stapler with a blue staple load across  this, held it in place for about 20 seconds, then fired and removed it.  This made a nice transection.  We then went about 15 cm proximal to the  tumor to resect the redundancy in the sigmoid colon, but allow plenty  of  the colon to create an anastomosis without tension.  We divided the  colon about 15 cm or more proximal to the tumor with a GIA stapling  device.  Peritoneum was scored on both sides being careful to avoid the  ureters.  Larger vessels were clamped, divided and ligated doubly with 2-  0 silk ligatures.  Smaller mesenteric vessels were controlled with the  Ligasure.  The specimen was marked with a silk suture at the distal  margin and sent fresh to the lab.   Dr. Nicoletta Dress said that the distal margin was 3 cm.  She said that there was  not enough tissue for the Biobank study.   We took the proximal sigmoid colon and cut off the staple line.  We used  Allis clamps to open this up and using sizers we could fairly easily get  a 29 mm into it and a 33 mm was a little bit tight.  We brought a 29 mm  EEA stapler to the operative field and took the anvil off of this and  inserted it up into the proximal colon segment.  We then closed the  distal segment with the GIA stapling device.  We then found a suitable  point on the antimesenteric border of the colon and made a small  puncture wound with the cautery and brought the stem of the anvil out  through this.  We placed a pursestring suture of 2-0 Vicryl around this  to hold things in place and this looked like it was going to set up a  very nice Baker anastomosis.   Dr. Lucia Gaskins then went down below and inserted the stapler into the  rectum.  We positioned this so that it would come out somewhat anterior  to the rectal staple line.  He then opened the stapler and the pointed  stem of the stapler came through the rectal wall anterior to the staple  line.  We positioned this.  We very carefully looked at the proximal  segment to make sure there was no twisting.  We then inserted the  proximal colon segment with the anvil onto the stem of the stapler and  then slowly closed the stapler, fired the stapler, opened it and then  removed it.  We had two  complete donuts of tissue.  The anastomosis  looked good.  Dr. Lucia Gaskins performed a proctoscopy and insufflated the  rectosigmoid colon under tension.  There was no air leak.  He could see  the staple line.  There was no bleeding.  All the air was evacuated.   We changed our instruments and gloves and suction devices at this point.  We irrigated out the lower abdomen and pelvis with about 3 liters of  saline.  There was really no bleeding or contamination.  We very  carefully closed the peritoneum on the left lateral side to avoid  internal herniation.  We did this with interrupted sutures of 3-0  Vicryl.  The colon lay in good position without any tension whatsoever.  Both the proximal and distal segments looked pink and healthy.  They had  bled well during the procedure.  There was no bleeding at the completion  of the procedure.  The packs and instruments were removed.  The small  bowel and omentum were returned to their anatomic positions.  The  midline fascia was closed with a running suture of #1, double-stranded  PDS.  After irrigating the subcutaneous tissue the skin was closed with  skin staples.  Clean bandages were placed and the patient taken to the  recovery room in stable condition.  Estimated blood loss was about 100  mL or less.   COMPLICATIONS:  None.   SPONGE AND COUNTS:  Correct.      Edsel Petrin. [name], M.D.  Electronically Signed     HMI/MEDQ  D:  01/09/2008  T:  01/09/2008  Job:  [id number]   cc:   [name] A. Alyson Ingles, M.D.  Fax: Struble. Rolla Flatten., M.D.  Fax: [phone]

## 2011-02-10 NOTE — H&P (Signed)
NAME:  EDNA, SPEARS             ACCOUNT NO.:  [id number]   MEDICAL RECORD NO.:  [id number]          PATIENT TYPE:  EMS   LOCATION:  ED                           FACILITY:  [facility]   PHYSICIAN:  Ashby Dawes. Polite, M.D. DATE OF BIRTH:  01-18-1941   DATE OF ADMISSION:  11/25/2007  DATE OF DISCHARGE:                              HISTORY & PHYSICAL   CHIEF COMPLAINT:  Nausea and vomiting x1, fever, chills.   HISTORY OF PRESENT ILLNESS:  A 70 year old male with known history of  diabetes, hypertension, high cholesterol, per patient recently diagnosed  with colon cancer at the time of his screening colonoscopy.  He recently  underwent CT of his abdomen and pelvis as well as PET scan because of  concern for prominent small bowel mesenteric lymph nodes.  The patient's  PET scan did not show evidence of hypermetabolic metastasis.  Enlargement of mesenteric lymph nodes did not exhibit significant FEG  uptake.  Over the last 2 days, the patient states he has been having  fever, chills, no energy.  He also has been exposed to a sick contact--  his wife who also had a very high temperature.  Today, he had an episode  of nausea and vomiting, and presented to the ED.  In the ED initially,  his temperature was 102.5, BP 149/63, pulse 148, respiratory rate 24,  sat 99%.  Subsequently, the temperature went as high as 103.4.  The  patient had a chest x-ray without definitive infiltrate.  He had labs.  He did not have leukocytosis.  WBC count was 5, 83% neutrophils, BMET  mild hypokalemia at 3.4, creatinine was unremarkable.  Urine showed  leukocyte esterase small.  Admission was deemed necessary for further  evaluation and treatment.  The patient denies any complaints of any  chest pain.  Denied any shortness of breath, but did have a rattle in  his chest.  He had a lot of cough, however, it was nonproductive.  He  states that he heard a rattle in the chest and then was coughing prior  to the episode of  emesis.  Denies any abdominal pain.   PAST MEDICAL HISTORY:  1. Significant for diabetes.  2. Hypertension.  3. High cholesterol.  4. Recent diagnosis of colon cancer on recent colonoscopy by Dr.      Oletta Lamas on November 07, 2007.  5. Recent CT of the abdomen and pelvis as well as PET scan on November 17, 2007 and November 23, 2007 respectively.   CURRENT MEDICATIONS:  1. Metformin 500 mg b.i.d.  2. Actos 30 mg a day.  3. Amlodipine 5 mg a day.  4. Januvia 100 mg a day.  5. Vytorin 10/40.  6. Aspirin 81 mg.   SOCIAL HISTORY:  Negative for tobacco, alcohol or drugs.   PAST SURGICAL HISTORY:  Surgery for nasal polyps.   ALLERGIES:  NONE.   FAMILY HISTORY:  Mother deceased.  She had end-stage renal disease and  trouble with her liver.  Father with diabetes.  Three brothers and one  sister, according to the patient healthy.  REVIEW OF SYSTEMS:  Stated in the HPI.   PHYSICAL EXAMINATION:  GENERAL:  The patient is alert and oriented in no  apparent distress.  VITAL SIGNS:  Current temperature 103.4, BP 125/49, pulse 144,  respiratory rate 22, sats 100%.  HEENT:  The patient does have somewhat injected sclerae on the right.  Moist mucosa.  NECK:  Supple.  No adenopathy.  CHEST:  Bilateral rhonchi, right greater than left.  CARDIOVASCULAR:  Tachy.  ABDOMEN:  Soft.  No distention.  No mass appreciated.  EXTREMITIES:  No edema.  NEURO:  Nonfocal.   ASSESSMENT:  1. Fever:  Temperature maximum of 103.4.  Probable influenza versus      pneumonia.  Current chest x-ray unremarkable.  However, lung exam      is abnormal.  2. Tachycardia:  Probably secondary to the patient's temperature.      Currently without hypotension or hypoxia.  3. Nausea and vomiting x1.  4. Diabetes.  5. Hypertension.  6. Hypercholesterolemia.  7. Recent diagnosis of colon cancer based on screening colonoscopy.   PLAN:  Recommend patient be admitted to a telemetry floor bed.  The  patient will be  treated with antibiotics, O2, nebs.  We will have a  follow up chest x-ray after IV fluids.  The patient will also have a  nasopharyngeal swab influenza.  We will make further recommendations  after review of the above studies.      Ashby Dawes. Polite, M.D.  Electronically Signed     RDP/MEDQ  D:  11/26/2007  T:  11/26/2007  Job:  [id number]

## 2011-02-10 NOTE — Discharge Summary (Signed)
NAME:  [name], [name]             ACCOUNT NO.:  [id number]   MEDICAL RECORD NO.:  [id number]          PATIENT TYPE:  INP   LOCATION:  60                         FACILITY:  [facility]   PHYSICIAN:  Edsel Petrin. [name], M.D.DATE OF BIRTH:  [birth year]   DATE OF ADMISSION:  01/09/2008  DATE OF DISCHARGE:  01/13/2008                               DISCHARGE SUMMARY   FINAL DIAGNOSES:  1. 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  2. Diabetes mellitus.  3. Hypertension.   OPERATION PERFORMED:  Low anterior resection of rectosigmoid cancer with  primary anastomosis.   DATE OF SURGERY:  January 09, 2008.   HISTORY:  This is a 70 year old man who had screening colonoscopy on  November 07, 2007.  Dr. Oletta Lamas found a noncircumferential mass with the  lower edge of the mass at 18 cm.  Dr. Oletta Lamas estimated this mass to be  25 mm in diameter and biopsy showed adenocarcinoma.  The CT scan showed  some enlarged small bowel lymph nodes and some tiny liver spots too  small to characterize.  A PET CT scan showed no abnormal enhancement.  The patient was counseled as an outpatient.  He underwent a 2 day bowel  prep at home and was brought to the hospital electively for surgery.   HOSPITAL COURSE:  On the day of admission the patient underwent a low  anterior resection of his proximal rectal cancer.  We were able to  widely resect this area and do a primary stapled anastomosis with an EEA  stapler.  We found no gross evidence of metastatic disease.  After the  anastomosis was done the rigid proctoscopy showed the anastomosis to be  at 15 cm or less.   Final pathology showed that this was a poorly differentiated  adenocarcinoma 1.7 cm in diameter.  Fifteen lymph nodes were examined  and all lymph nodes were negative.  This was staged at a T2, N0 tumor.  This was discussed with the patient and arrangements will be made for  medical oncology consultation as an  outpatient.   Postoperatively the patient actually did very well.  He was placed on  the Entereg protocol.  He had an ileus for a couple of days and then  began passing a little bit of blood and liquid stool.  He advanced his  diet and activities over the next 48 hours without much problem.  His  diabetes was controlled nicely with Lantus insulin and sliding scale  insulin.  He was ready for discharge on April 17.  At that time he was  tolerating full liquids and was advanced to a low-residue diet and  tolerated that as well.  He had had two bowel movements in the previous  24 hours and felt well.  His abdomen was soft and his wound was clean  and healing up nicely.  No signs of infection.   He was asked to follow up with me in the office in 1 week for staple  removal.  In terms of his diabetes he was told to go back on  his usual  medication regimen which include metformin 500 mg b.i.d., Januvia 100 mg  once a day, Actos 30 mg once a day.  He also takes Vytorin and  amlodipine.  Arrangements will be made as an outpatient to see a medical  oncologist.      Edsel Petrin. [name], M.D.  Electronically Signed     HMI/MEDQ  D:  01/28/2008  T:  01/28/2008  Job:  [id number]   cc:   Jeneen Rinks L. Rolla Flatten., M.D.  Fax: [phone]   Lynn Alyson Ingles, M.D.  Fax: [phone]

## 2011-03-18 ENCOUNTER — Emergency Department (HOSPITAL_COMMUNITY)
Admission: EM | Admit: 2011-03-18 | Discharge: 2011-03-18 | Disposition: A | Discharge status: Home / Self Care | Payer: Medicare Other | Attending: Emergency Medicine | Admitting: Emergency Medicine

## 2011-03-18 ENCOUNTER — Emergency Department (HOSPITAL_COMMUNITY): Payer: Medicare Other

## 2011-03-18 DIAGNOSIS — Z85038 Personal history of other malignant neoplasm of large intestine: Secondary | ICD-10-CM | POA: Insufficient documentation

## 2011-03-18 DIAGNOSIS — E119 Type 2 diabetes mellitus without complications: Secondary | ICD-10-CM | POA: Insufficient documentation

## 2011-03-18 DIAGNOSIS — E7800 Pure hypercholesterolemia, unspecified: Secondary | ICD-10-CM | POA: Insufficient documentation

## 2011-03-18 DIAGNOSIS — A6000 Herpesviral infection of urogenital system, unspecified: Secondary | ICD-10-CM | POA: Insufficient documentation

## 2011-03-18 DIAGNOSIS — R1904 Left lower quadrant abdominal swelling, mass and lump: Secondary | ICD-10-CM | POA: Insufficient documentation

## 2011-03-24 ENCOUNTER — Encounter (HOSPITAL_COMMUNITY): Payer: Self-pay

## 2011-03-24 ENCOUNTER — Other Ambulatory Visit: Payer: Self-pay | Admitting: Hematology and Oncology

## 2011-03-24 ENCOUNTER — Ambulatory Visit (HOSPITAL_COMMUNITY)
Admission: RE | Admit: 2011-03-24 | Discharge: 2011-03-24 | Disposition: A | Discharge status: Home / Self Care | Payer: Medicare Other | Source: Ambulatory Visit | Attending: Hematology and Oncology | Admitting: Hematology and Oncology

## 2011-03-24 ENCOUNTER — Encounter (HOSPITAL_BASED_OUTPATIENT_CLINIC_OR_DEPARTMENT_OTHER): Payer: Medicare Other | Admitting: Hematology and Oncology

## 2011-03-24 DIAGNOSIS — B029 Zoster without complications: Secondary | ICD-10-CM | POA: Insufficient documentation

## 2011-03-24 DIAGNOSIS — Q619 Cystic kidney disease, unspecified: Secondary | ICD-10-CM | POA: Insufficient documentation

## 2011-03-24 DIAGNOSIS — R599 Enlarged lymph nodes, unspecified: Secondary | ICD-10-CM | POA: Insufficient documentation

## 2011-03-24 DIAGNOSIS — C8599 Non-Hodgkin lymphoma, unspecified, extranodal and solid organ sites: Secondary | ICD-10-CM

## 2011-03-24 DIAGNOSIS — R161 Splenomegaly, not elsewhere classified: Secondary | ICD-10-CM | POA: Insufficient documentation

## 2011-03-24 DIAGNOSIS — C9110 Chronic lymphocytic leukemia of B-cell type not having achieved remission: Secondary | ICD-10-CM

## 2011-03-24 DIAGNOSIS — K7689 Other specified diseases of liver: Secondary | ICD-10-CM | POA: Insufficient documentation

## 2011-03-24 DIAGNOSIS — Z856 Personal history of leukemia: Secondary | ICD-10-CM | POA: Insufficient documentation

## 2011-03-24 DIAGNOSIS — R609 Edema, unspecified: Secondary | ICD-10-CM | POA: Insufficient documentation

## 2011-03-24 DIAGNOSIS — C19 Malignant neoplasm of rectosigmoid junction: Secondary | ICD-10-CM

## 2011-03-24 DIAGNOSIS — C189 Malignant neoplasm of colon, unspecified: Secondary | ICD-10-CM | POA: Insufficient documentation

## 2011-03-24 HISTORY — DX: Malignant neoplasm of rectosigmoid junction: C19

## 2011-03-24 LAB — CMP (CANCER CENTER ONLY)
ALT(SGPT): 21 U/L (ref 10–47)
AST: 27 U/L (ref 11–38)
Albumin: 3.9 g/dL (ref 3.3–5.5)
Alkaline Phosphatase: 126 U/L — ABNORMAL HIGH (ref 26–84)
BUN, Bld: 13 mg/dL (ref 7–22)
CO2: 28 mEq/L (ref 18–33)
Calcium: 9.2 mg/dL (ref 8.0–10.3)
Chloride: 97 mEq/L — ABNORMAL LOW (ref 98–108)
Creat: 1.1 mg/dl (ref 0.6–1.2)
Glucose, Bld: 151 mg/dL — ABNORMAL HIGH (ref 73–118)
Potassium: 5.1 mEq/L — ABNORMAL HIGH (ref 3.3–4.7)
Sodium: 143 mEq/L (ref 128–145)
Total Bilirubin: 0.8 mg/dl (ref 0.20–1.60)
Total Protein: 7.7 g/dL (ref 6.4–8.1)

## 2011-03-24 LAB — MANUAL DIFFERENTIAL
ALC: 13.8 10*3/uL — ABNORMAL HIGH (ref 0.9–3.3)
ANC (CHCC manual diff): 2.9 10*3/uL (ref 1.5–6.5)
Band Neutrophils: 0 % (ref 0–10)
Basophil: 0 % (ref 0–2)
Blasts: 0 % (ref 0–0)
EOS: 0 % (ref 0–7)
LYMPH: 80 % — ABNORMAL HIGH (ref 14–49)
MONO: 3 % (ref 0–14)
Metamyelocytes: 0 % (ref 0–0)
Myelocytes: 0 % (ref 0–0)
Other Cell: 0 % (ref 0–0)
PLT EST: DECREASED
PROMYELO: 0 % (ref 0–0)
RBC Comments: NORMAL
SEG: 17 % — ABNORMAL LOW (ref 38–77)
Variant Lymph: 0 % (ref 0–0)
nRBC: 0 % (ref 0–0)

## 2011-03-24 LAB — CBC WITH DIFFERENTIAL/PLATELET
HCT: 38.4 % (ref 38.4–49.9)
HGB: 12.8 g/dL — ABNORMAL LOW (ref 13.0–17.1)
MCH: 29.5 pg (ref 27.2–33.4)
MCHC: 33.4 g/dL (ref 32.0–36.0)
MCV: 88.4 fL (ref 79.3–98.0)
Platelets: 121 10*3/uL — ABNORMAL LOW (ref 140–400)
RBC: 4.34 10*6/uL (ref 4.20–5.82)
RDW: 14.5 % (ref 11.0–14.6)
WBC: 17.2 10*3/uL — ABNORMAL HIGH (ref 4.0–10.3)

## 2011-03-24 MED ORDER — IOHEXOL 300 MG/ML  SOLN
125.0000 mL | Freq: Once | INTRAMUSCULAR | Status: AC | PRN
  Administered 2011-03-24: 125 mL via INTRAVENOUS

## 2011-03-25 LAB — IGG, IGA, IGM
IgA: 181 mg/dL (ref 68–379)
IgG (Immunoglobin G), Serum: 1130 mg/dL (ref 650–1600)
IgM, Serum: 63 mg/dL (ref 41–251)

## 2011-03-25 LAB — CEA: CEA: <0.5 ng/mL (ref 0.0–5.0)

## 2011-03-25 LAB — LACTATE DEHYDROGENASE: LDH: 340 U/L — ABNORMAL HIGH (ref 94–250)

## 2011-03-27 ENCOUNTER — Encounter (HOSPITAL_BASED_OUTPATIENT_CLINIC_OR_DEPARTMENT_OTHER): Payer: Medicare Other | Admitting: Hematology and Oncology

## 2011-03-27 ENCOUNTER — Other Ambulatory Visit: Payer: Self-pay | Admitting: Hematology and Oncology

## 2011-03-27 DIAGNOSIS — C9110 Chronic lymphocytic leukemia of B-cell type not having achieved remission: Secondary | ICD-10-CM

## 2011-03-27 DIAGNOSIS — C189 Malignant neoplasm of colon, unspecified: Secondary | ICD-10-CM

## 2011-06-19 ENCOUNTER — Other Ambulatory Visit: Payer: Self-pay | Admitting: Hematology and Oncology

## 2011-06-19 ENCOUNTER — Ambulatory Visit (HOSPITAL_COMMUNITY)
Admission: RE | Admit: 2011-06-19 | Discharge: 2011-06-19 | Disposition: A | Discharge status: Home / Self Care | Payer: Medicare Other | Source: Ambulatory Visit | Attending: Hematology and Oncology | Admitting: Hematology and Oncology

## 2011-06-19 ENCOUNTER — Encounter (HOSPITAL_BASED_OUTPATIENT_CLINIC_OR_DEPARTMENT_OTHER): Payer: Medicare Other | Admitting: Hematology and Oncology

## 2011-06-19 DIAGNOSIS — Q619 Cystic kidney disease, unspecified: Secondary | ICD-10-CM | POA: Insufficient documentation

## 2011-06-19 DIAGNOSIS — R599 Enlarged lymph nodes, unspecified: Secondary | ICD-10-CM | POA: Insufficient documentation

## 2011-06-19 DIAGNOSIS — C8599 Non-Hodgkin lymphoma, unspecified, extranodal and solid organ sites: Secondary | ICD-10-CM

## 2011-06-19 DIAGNOSIS — K7689 Other specified diseases of liver: Secondary | ICD-10-CM | POA: Insufficient documentation

## 2011-06-19 DIAGNOSIS — C189 Malignant neoplasm of colon, unspecified: Secondary | ICD-10-CM | POA: Insufficient documentation

## 2011-06-19 DIAGNOSIS — C9110 Chronic lymphocytic leukemia of B-cell type not having achieved remission: Secondary | ICD-10-CM | POA: Insufficient documentation

## 2011-06-19 DIAGNOSIS — I700 Atherosclerosis of aorta: Secondary | ICD-10-CM | POA: Insufficient documentation

## 2011-06-19 DIAGNOSIS — R161 Splenomegaly, not elsewhere classified: Secondary | ICD-10-CM | POA: Insufficient documentation

## 2011-06-19 LAB — CBC
HCT: 36.5 — ABNORMAL LOW
HCT: 39.7
Hemoglobin: 12.5 — ABNORMAL LOW
Hemoglobin: 13.8
MCHC: 34.3
MCHC: 34.7
MCV: 84.7
MCV: 85.3
Platelets: 95 — ABNORMAL LOW
Platelets: 99 — ABNORMAL LOW
RBC: 4.28
RBC: 4.69
RDW: 13.7
RDW: 13.7
WBC: 4
WBC: 5

## 2011-06-19 LAB — BASIC METABOLIC PANEL
BUN: 15
BUN: 15
CO2: 21
CO2: 23
Calcium: 8.1 — ABNORMAL LOW
Calcium: 9.1
Chloride: 107
Chloride: 108
Creatinine, Ser: 1.13
Creatinine, Ser: 1.26
GFR calc Af Amer: >60
GFR calc Af Amer: >60
GFR calc non Af Amer: 58 — ABNORMAL LOW
GFR calc non Af Amer: >60
Glucose, Bld: 167 — ABNORMAL HIGH
Glucose, Bld: 192 — ABNORMAL HIGH
Potassium: 3.3 — ABNORMAL LOW
Potassium: 3.4 — ABNORMAL LOW
Sodium: 138
Sodium: 140

## 2011-06-19 LAB — CBC WITH DIFFERENTIAL/PLATELET
BASO%: 0.1 % (ref 0.0–2.0)
Basophils Absolute: 0 10*3/uL (ref 0.0–0.1)
EOS%: 0.7 % (ref 0.0–7.0)
Eosinophils Absolute: 0.2 10*3/uL (ref 0.0–0.5)
HCT: 38.1 % — ABNORMAL LOW (ref 38.4–49.9)
HGB: 12.6 g/dL — ABNORMAL LOW (ref 13.0–17.1)
LYMPH%: 86.6 % — ABNORMAL HIGH (ref 14.0–49.0)
MCH: 30.5 pg (ref 27.2–33.4)
MCHC: 33.2 g/dL (ref 32.0–36.0)
MCV: 91.8 fL (ref 79.3–98.0)
MONO#: 1.3 10*3/uL — ABNORMAL HIGH (ref 0.1–0.9)
MONO%: 4 % (ref 0.0–14.0)
NEUT#: 2.7 10*3/uL (ref 1.5–6.5)
NEUT%: 8.6 % — ABNORMAL LOW (ref 39.0–75.0)
Platelets: 109 10*3/uL — ABNORMAL LOW (ref 140–400)
RBC: 4.16 10*6/uL — ABNORMAL LOW (ref 4.20–5.82)
RDW: 15.1 % — ABNORMAL HIGH (ref 11.0–14.6)
WBC: 31.5 10*3/uL — ABNORMAL HIGH (ref 4.0–10.3)
lymph#: 27.3 10*3/uL — ABNORMAL HIGH (ref 0.9–3.3)

## 2011-06-19 LAB — CMP (CANCER CENTER ONLY)
ALT(SGPT): 23 U/L (ref 10–47)
AST: 25 U/L (ref 11–38)
Albumin: 4.2 g/dL (ref 3.3–5.5)
Alkaline Phosphatase: 103 U/L — ABNORMAL HIGH (ref 26–84)
BUN, Bld: 16 mg/dL (ref 7–22)
CO2: 27 mEq/L (ref 18–33)
Calcium: 9.4 mg/dL (ref 8.0–10.3)
Chloride: 103 mEq/L (ref 98–108)
Creat: 1.2 mg/dl (ref 0.6–1.2)
Glucose, Bld: 108 mg/dL (ref 73–118)
Potassium: 4.7 mEq/L (ref 3.3–4.7)
Sodium: 142 mEq/L (ref 128–145)
Total Bilirubin: 1.2 mg/dl (ref 0.20–1.60)
Total Protein: 7.2 g/dL (ref 6.4–8.1)

## 2011-06-19 LAB — DIFFERENTIAL
Basophils Absolute: 0
Basophils Absolute: 0
Basophils Relative: 0
Basophils Relative: 0
Eosinophils Absolute: 0
Eosinophils Absolute: 0
Eosinophils Relative: 0
Eosinophils Relative: 1
Lymphocytes Relative: 13
Lymphocytes Relative: 18
Lymphs Abs: 0.7
Lymphs Abs: 0.7
Monocytes Absolute: 0.2
Monocytes Absolute: 0.3
Monocytes Relative: 3
Monocytes Relative: 8
Neutro Abs: 3
Neutro Abs: 4.1
Neutrophils Relative %: 74
Neutrophils Relative %: 83 — ABNORMAL HIGH

## 2011-06-19 LAB — BLOOD GAS, ARTERIAL
Acid-base deficit: 1.4
Bicarbonate: 22.2
Drawn by: 295541
O2 Content: 3
O2 Saturation: 95.1
Patient temperature: 101.4
TCO2: 19.8
pCO2 arterial: 37.9
pH, Arterial: 7.393
pO2, Arterial: 81.3

## 2011-06-19 LAB — URINALYSIS, ROUTINE W REFLEX MICROSCOPIC
Bilirubin Urine: NEGATIVE
Glucose, UA: 250 — AB
Ketones, ur: 40 — AB
Nitrite: NEGATIVE
Protein, ur: NEGATIVE
Specific Gravity, Urine: 1.015
Urobilinogen, UA: 0.2
pH: 6.5

## 2011-06-19 LAB — CULTURE, BLOOD (ROUTINE X 2)
Culture: NO GROWTH
Culture: NO GROWTH

## 2011-06-19 LAB — INFLUENZA A+B VIRUS AG-DIRECT(RAPID)
Inflenza A Ag: NEGATIVE
Influenza B Ag: NEGATIVE

## 2011-06-19 LAB — URINE MICROSCOPIC-ADD ON

## 2011-06-19 LAB — D-DIMER, QUANTITATIVE: D-Dimer, Quant: 0.55 — ABNORMAL HIGH

## 2011-06-19 LAB — TECHNOLOGIST REVIEW

## 2011-06-19 LAB — LACTATE DEHYDROGENASE: LDH: 234 U/L (ref 94–250)

## 2011-06-19 MED ORDER — IOHEXOL 300 MG/ML  SOLN
125.0000 mL | Freq: Once | INTRAMUSCULAR | Status: AC | PRN
  Administered 2011-06-19: 125 mL via INTRAVENOUS

## 2011-06-22 LAB — BASIC METABOLIC PANEL
BUN: 12
BUN: 16
CO2: 24
CO2: 25
Calcium: 7.9 — ABNORMAL LOW
Calcium: 8 — ABNORMAL LOW
Chloride: 108
Chloride: 112
Creatinine, Ser: 0.9
Creatinine, Ser: 1.05
GFR calc Af Amer: >60
GFR calc Af Amer: >60
GFR calc non Af Amer: >60
GFR calc non Af Amer: >60
Glucose, Bld: 108 — ABNORMAL HIGH
Glucose, Bld: 133 — ABNORMAL HIGH
Potassium: 3.2 — ABNORMAL LOW
Potassium: 3.7
Sodium: 138
Sodium: 141

## 2011-06-22 LAB — CBC
HCT: 35 — ABNORMAL LOW
HCT: 37.8 — ABNORMAL LOW
Hemoglobin: 12.1 — ABNORMAL LOW
Hemoglobin: 13.1
MCHC: 34.5
MCHC: 34.6
MCV: 85
MCV: 85.2
Platelets: 103 — ABNORMAL LOW
Platelets: 81 — ABNORMAL LOW
RBC: 4.12 — ABNORMAL LOW
RBC: 4.43
RDW: 13.7
RDW: 14
WBC: 4.8
WBC: 9.7

## 2011-06-23 ENCOUNTER — Other Ambulatory Visit: Payer: Self-pay | Admitting: Hematology and Oncology

## 2011-06-23 ENCOUNTER — Other Ambulatory Visit (HOSPITAL_COMMUNITY): Payer: Medicare Other

## 2011-06-23 ENCOUNTER — Encounter (HOSPITAL_BASED_OUTPATIENT_CLINIC_OR_DEPARTMENT_OTHER): Payer: Medicare Other | Admitting: Hematology and Oncology

## 2011-06-23 ENCOUNTER — Other Ambulatory Visit (HOSPITAL_COMMUNITY)
Admission: RE | Admit: 2011-06-23 | Discharge: 2011-06-23 | Disposition: A | Discharge status: Home / Self Care | Payer: Medicare Other | Source: Ambulatory Visit | Attending: Hematology and Oncology | Admitting: Hematology and Oncology

## 2011-06-23 DIAGNOSIS — C189 Malignant neoplasm of colon, unspecified: Secondary | ICD-10-CM | POA: Insufficient documentation

## 2011-06-23 DIAGNOSIS — Z23 Encounter for immunization: Secondary | ICD-10-CM

## 2011-06-23 DIAGNOSIS — D696 Thrombocytopenia, unspecified: Secondary | ICD-10-CM

## 2011-06-23 DIAGNOSIS — C9110 Chronic lymphocytic leukemia of B-cell type not having achieved remission: Secondary | ICD-10-CM

## 2011-06-23 DIAGNOSIS — C8599 Non-Hodgkin lymphoma, unspecified, extranodal and solid organ sites: Secondary | ICD-10-CM | POA: Insufficient documentation

## 2011-06-23 DIAGNOSIS — C19 Malignant neoplasm of rectosigmoid junction: Secondary | ICD-10-CM

## 2011-06-23 LAB — CBC
HCT: 41
HCT: 41.1
HCT: 38.9 — ABNORMAL LOW
Hemoglobin: 14.1
Hemoglobin: 14.2
Hemoglobin: 13.3
MCHC: 34.5
MCHC: 34.5
MCHC: 34.1
MCV: 84.7
MCV: 85.7
MCV: 86
Platelets: 101 — ABNORMAL LOW
Platelets: 133 — ABNORMAL LOW
Platelets: 117 — ABNORMAL LOW
RBC: 4.8
RBC: 4.83
RBC: 4.52
RDW: 14.1
RDW: 14.3
RDW: 14.6
WBC: 5.4
WBC: 8.6
WBC: 6.9

## 2011-06-23 LAB — COMPREHENSIVE METABOLIC PANEL
ALT: 15
AST: 18
Albumin: 4.1
Alkaline Phosphatase: 70
BUN: 14
CO2: 27
Calcium: 8.9
Chloride: 108
Creatinine, Ser: 1.08
GFR calc Af Amer: >60
GFR calc non Af Amer: >60
Glucose, Bld: 118 — ABNORMAL HIGH
Potassium: 3.6
Sodium: 139
Total Bilirubin: 1.3 — ABNORMAL HIGH
Total Protein: 7

## 2011-06-23 LAB — BASIC METABOLIC PANEL
BUN: 4 — ABNORMAL LOW
CO2: 28
Calcium: 8.8
Chloride: 104
Creatinine, Ser: 0.87
GFR calc Af Amer: >60
GFR calc non Af Amer: >60
Glucose, Bld: 161 — ABNORMAL HIGH
Potassium: 4.7
Sodium: 135

## 2011-06-23 LAB — URINALYSIS, ROUTINE W REFLEX MICROSCOPIC
Bilirubin Urine: NEGATIVE
Glucose, UA: NEGATIVE
Hgb urine dipstick: NEGATIVE
Ketones, ur: NEGATIVE
Nitrite: NEGATIVE
Protein, ur: NEGATIVE
Specific Gravity, Urine: 1.017
Urobilinogen, UA: 0.2
pH: 6

## 2011-06-23 LAB — DIFFERENTIAL
Basophils Absolute: 0
Basophils Relative: 0
Eosinophils Absolute: 0.1
Eosinophils Relative: 2
Lymphocytes Relative: 45
Lymphs Abs: 2.4
Monocytes Absolute: 0.4
Monocytes Relative: 8
Neutro Abs: 2.4
Neutrophils Relative %: 45

## 2011-06-23 LAB — HEMOGLOBIN A1C
Hgb A1c MFr Bld: 5.6
Mean Plasma Glucose: 122

## 2011-06-23 LAB — TYPE AND SCREEN
ABO/RH(D): O NEG
Antibody Screen: NEGATIVE

## 2011-06-23 LAB — CEA: CEA: <0.5

## 2011-06-23 LAB — ABO/RH: ABO/RH(D): O NEG

## 2011-06-23 LAB — PREALBUMIN: Prealbumin: 30.4

## 2011-06-24 ENCOUNTER — Encounter: Payer: Medicare Other | Admitting: Hematology and Oncology

## 2011-06-24 LAB — FLOW CYTOMETRY

## 2011-06-25 LAB — SPEP & IFE WITH QIG
Albumin ELP: 63.4 % (ref 55.8–66.1)
Alpha-1-Globulin: 3.4 % (ref 2.9–4.9)
Alpha-2-Globulin: 8.6 % (ref 7.1–11.8)
Beta 2: 4.5 % (ref 3.2–6.5)
Beta Globulin: 5.8 % (ref 4.7–7.2)
Gamma Globulin: 14.3 % (ref 11.1–18.8)
IgA: 166 mg/dL (ref 68–379)
IgG (Immunoglobin G), Serum: 1070 mg/dL (ref 650–1600)
IgM, Serum: 57 mg/dL (ref 41–251)
Total Protein, Serum Electrophoresis: 6.7 g/dL (ref 6.0–8.3)

## 2011-06-25 LAB — FERRITIN: Ferritin: 91 ng/mL (ref 22–322)

## 2011-07-21 ENCOUNTER — Encounter (HOSPITAL_BASED_OUTPATIENT_CLINIC_OR_DEPARTMENT_OTHER): Payer: Medicare Other | Admitting: Hematology and Oncology

## 2011-07-21 ENCOUNTER — Other Ambulatory Visit: Payer: Self-pay | Admitting: Hematology and Oncology

## 2011-07-21 DIAGNOSIS — Z23 Encounter for immunization: Secondary | ICD-10-CM

## 2011-07-21 DIAGNOSIS — C9110 Chronic lymphocytic leukemia of B-cell type not having achieved remission: Secondary | ICD-10-CM

## 2011-07-21 DIAGNOSIS — C8599 Non-Hodgkin lymphoma, unspecified, extranodal and solid organ sites: Secondary | ICD-10-CM

## 2011-07-21 LAB — COMPREHENSIVE METABOLIC PANEL
ALT: 13 U/L (ref 0–53)
AST: 18 U/L (ref 0–37)
Albumin: 4.7 g/dL (ref 3.5–5.2)
Alkaline Phosphatase: 117 U/L (ref 39–117)
BUN: 16 mg/dL (ref 6–23)
CO2: 25 mEq/L (ref 19–32)
Calcium: 9.3 mg/dL (ref 8.4–10.5)
Chloride: 106 mEq/L (ref 96–112)
Creatinine, Ser: 1.15 mg/dL (ref 0.50–1.35)
Glucose, Bld: 173 mg/dL — ABNORMAL HIGH (ref 70–99)
Potassium: 4.6 mEq/L (ref 3.5–5.3)
Sodium: 141 mEq/L (ref 135–145)
Total Bilirubin: 1 mg/dL (ref 0.3–1.2)
Total Protein: 6.8 g/dL (ref 6.0–8.3)

## 2011-07-21 LAB — CBC WITH DIFFERENTIAL/PLATELET
BASO%: 0.3 % (ref 0.0–2.0)
Basophils Absolute: 0.1 10*3/uL (ref 0.0–0.1)
EOS%: 0.5 % (ref 0.0–7.0)
Eosinophils Absolute: 0.1 10*3/uL (ref 0.0–0.5)
HCT: 37.8 % — ABNORMAL LOW (ref 38.4–49.9)
HGB: 12.7 g/dL — ABNORMAL LOW (ref 13.0–17.1)
LYMPH%: 83.9 % — ABNORMAL HIGH (ref 14.0–49.0)
MCH: 30.5 pg (ref 27.2–33.4)
MCHC: 33.5 g/dL (ref 32.0–36.0)
MCV: 91.2 fL (ref 79.3–98.0)
MONO#: 1.7 10*3/uL — ABNORMAL HIGH (ref 0.1–0.9)
MONO%: 6.3 % (ref 0.0–14.0)
NEUT#: 2.4 10*3/uL (ref 1.5–6.5)
NEUT%: 9 % — ABNORMAL LOW (ref 39.0–75.0)
Platelets: 95 10*3/uL — ABNORMAL LOW (ref 140–400)
RBC: 4.15 10*6/uL — ABNORMAL LOW (ref 4.20–5.82)
RDW: 14.5 % (ref 11.0–14.6)
WBC: 26.9 10*3/uL — ABNORMAL HIGH (ref 4.0–10.3)
lymph#: 22.5 10*3/uL — ABNORMAL HIGH (ref 0.9–3.3)

## 2011-07-21 LAB — LACTATE DEHYDROGENASE: LDH: 227 U/L (ref 94–250)

## 2011-07-22 ENCOUNTER — Encounter (HOSPITAL_BASED_OUTPATIENT_CLINIC_OR_DEPARTMENT_OTHER): Payer: Medicare Other | Admitting: Hematology and Oncology

## 2011-07-22 DIAGNOSIS — C8599 Non-Hodgkin lymphoma, unspecified, extranodal and solid organ sites: Secondary | ICD-10-CM

## 2011-07-22 DIAGNOSIS — C9110 Chronic lymphocytic leukemia of B-cell type not having achieved remission: Secondary | ICD-10-CM

## 2011-07-22 DIAGNOSIS — C19 Malignant neoplasm of rectosigmoid junction: Secondary | ICD-10-CM

## 2011-08-01 ENCOUNTER — Other Ambulatory Visit: Payer: Self-pay | Admitting: Hematology and Oncology

## 2011-08-01 DIAGNOSIS — C189 Malignant neoplasm of colon, unspecified: Secondary | ICD-10-CM | POA: Insufficient documentation

## 2011-08-01 DIAGNOSIS — C9110 Chronic lymphocytic leukemia of B-cell type not having achieved remission: Secondary | ICD-10-CM | POA: Insufficient documentation

## 2011-08-03 ENCOUNTER — Other Ambulatory Visit: Payer: Self-pay | Admitting: Hematology and Oncology

## 2011-08-03 ENCOUNTER — Other Ambulatory Visit: Payer: Medicare Other

## 2011-08-04 ENCOUNTER — Telehealth: Payer: Self-pay | Admitting: *Deleted

## 2011-08-04 ENCOUNTER — Ambulatory Visit (HOSPITAL_BASED_OUTPATIENT_CLINIC_OR_DEPARTMENT_OTHER): Payer: Medicare Other

## 2011-08-04 ENCOUNTER — Encounter: Payer: Self-pay | Admitting: Pharmacist

## 2011-08-04 VITALS — BP 123/64 | HR 105 | Temp 97.8°F | Ht 67.5 in | Wt 146.0 lb

## 2011-08-04 DIAGNOSIS — Z5112 Encounter for antineoplastic immunotherapy: Secondary | ICD-10-CM

## 2011-08-04 DIAGNOSIS — C9110 Chronic lymphocytic leukemia of B-cell type not having achieved remission: Secondary | ICD-10-CM

## 2011-08-04 MED ORDER — DIPHENHYDRAMINE HCL 25 MG PO CAPS
50.0000 mg | ORAL_CAPSULE | Freq: Once | ORAL | Status: AC
  Administered 2011-08-04: 50 mg via ORAL

## 2011-08-04 MED ORDER — SODIUM CHLORIDE 0.9 % IV SOLN
375.0000 mg/m2 | Freq: Once | INTRAVENOUS | Status: AC
  Administered 2011-08-04: 700 mg via INTRAVENOUS
  Filled 2011-08-04: qty 70

## 2011-08-04 MED ORDER — ACETAMINOPHEN 325 MG PO TABS
650.0000 mg | ORAL_TABLET | Freq: Once | ORAL | Status: AC
  Administered 2011-08-04: 650 mg via ORAL

## 2011-08-04 MED ORDER — SODIUM CHLORIDE 0.9 % IV SOLN
Freq: Once | INTRAVENOUS | Status: AC
  Administered 2011-08-04: 09:00:00 via INTRAVENOUS

## 2011-08-05 ENCOUNTER — Ambulatory Visit (HOSPITAL_BASED_OUTPATIENT_CLINIC_OR_DEPARTMENT_OTHER): Payer: Medicare Other

## 2011-08-05 VITALS — BP 139/61 | HR 89 | Temp 98.1°F

## 2011-08-05 DIAGNOSIS — C9110 Chronic lymphocytic leukemia of B-cell type not having achieved remission: Secondary | ICD-10-CM

## 2011-08-05 DIAGNOSIS — Z5111 Encounter for antineoplastic chemotherapy: Secondary | ICD-10-CM

## 2011-08-05 MED ORDER — ONDANSETRON 8 MG/50ML IVPB (CHCC)
8.0000 mg | Freq: Once | INTRAVENOUS | Status: AC
  Administered 2011-08-05: 8 mg via INTRAVENOUS

## 2011-08-05 MED ORDER — SODIUM CHLORIDE 0.9 % IV SOLN
Freq: Once | INTRAVENOUS | Status: AC
  Administered 2011-08-05: 10:00:00 via INTRAVENOUS

## 2011-08-05 MED ORDER — SODIUM CHLORIDE 0.9 % IV SOLN
25.0000 mg/m2 | Freq: Once | INTRAVENOUS | Status: AC
  Administered 2011-08-05: 45 mg via INTRAVENOUS
  Filled 2011-08-05: qty 1.8

## 2011-08-05 MED ORDER — SODIUM CHLORIDE 0.9 % IV SOLN
250.0000 mg/m2 | Freq: Once | INTRAVENOUS | Status: AC
  Administered 2011-08-05: 440 mg via INTRAVENOUS
  Filled 2011-08-05: qty 22

## 2011-08-06 ENCOUNTER — Ambulatory Visit (HOSPITAL_BASED_OUTPATIENT_CLINIC_OR_DEPARTMENT_OTHER): Payer: Medicare Other

## 2011-08-06 VITALS — BP 115/68 | HR 78 | Temp 97.4°F

## 2011-08-06 DIAGNOSIS — C9110 Chronic lymphocytic leukemia of B-cell type not having achieved remission: Secondary | ICD-10-CM

## 2011-08-06 DIAGNOSIS — Z5111 Encounter for antineoplastic chemotherapy: Secondary | ICD-10-CM

## 2011-08-06 MED ORDER — SODIUM CHLORIDE 0.9 % IV SOLN
25.0000 mg/m2 | Freq: Once | INTRAVENOUS | Status: AC
  Administered 2011-08-06: 45 mg via INTRAVENOUS
  Filled 2011-08-06: qty 1.8

## 2011-08-06 MED ORDER — SODIUM CHLORIDE 0.9 % IV SOLN
250.0000 mg/m2 | Freq: Once | INTRAVENOUS | Status: AC
  Administered 2011-08-06: 440 mg via INTRAVENOUS
  Filled 2011-08-06: qty 22

## 2011-08-06 MED ORDER — SODIUM CHLORIDE 0.9 % IV SOLN
Freq: Once | INTRAVENOUS | Status: AC
  Administered 2011-08-06: 10:00:00 via INTRAVENOUS

## 2011-08-06 MED ORDER — ONDANSETRON 8 MG/50ML IVPB (CHCC)
8.0000 mg | Freq: Once | INTRAVENOUS | Status: AC
  Administered 2011-08-06: 8 mg via INTRAVENOUS

## 2011-08-06 NOTE — Patient Instructions (Signed)
1210pm; Instructed pt to call for any temp greater than 100.12F and for any unrelieved symptoms such as nausea and/or vomiting.  Pt verbalized understanding and plans to return tomorrow for chemo as scheduled.

## 2011-08-07 ENCOUNTER — Ambulatory Visit (HOSPITAL_BASED_OUTPATIENT_CLINIC_OR_DEPARTMENT_OTHER): Payer: Medicare Other

## 2011-08-07 ENCOUNTER — Encounter: Payer: Self-pay | Admitting: Certified Registered Nurse Anesthetist

## 2011-08-07 VITALS — BP 129/68 | HR 90 | Temp 98.7°F

## 2011-08-07 DIAGNOSIS — C9110 Chronic lymphocytic leukemia of B-cell type not having achieved remission: Secondary | ICD-10-CM

## 2011-08-07 DIAGNOSIS — Z5111 Encounter for antineoplastic chemotherapy: Secondary | ICD-10-CM

## 2011-08-07 MED ORDER — ONDANSETRON 8 MG/50ML IVPB (CHCC)
8.0000 mg | Freq: Once | INTRAVENOUS | Status: AC
  Administered 2011-08-07: 8 mg via INTRAVENOUS

## 2011-08-07 MED ORDER — SODIUM CHLORIDE 0.9 % IV SOLN
Freq: Once | INTRAVENOUS | Status: AC
  Administered 2011-08-07: 10:00:00 via INTRAVENOUS

## 2011-08-07 MED ORDER — SODIUM CHLORIDE 0.9 % IV SOLN
25.0000 mg/m2 | Freq: Once | INTRAVENOUS | Status: AC
  Administered 2011-08-07: 45 mg via INTRAVENOUS
  Filled 2011-08-07: qty 1.8

## 2011-08-07 MED ORDER — SODIUM CHLORIDE 0.9 % IV SOLN
250.0000 mg/m2 | Freq: Once | INTRAVENOUS | Status: AC
  Administered 2011-08-07: 440 mg via INTRAVENOUS
  Filled 2011-08-07: qty 22

## 2011-08-07 NOTE — Progress Notes (Signed)
Correct ht and wt: see comments

## 2011-08-07 NOTE — Patient Instructions (Signed)
1150-Pt discharged ambulatory with next appointment confirmed.  Pt aware to call with any questions or concerns.

## 2011-08-08 ENCOUNTER — Ambulatory Visit (HOSPITAL_BASED_OUTPATIENT_CLINIC_OR_DEPARTMENT_OTHER): Payer: Medicare Other

## 2011-08-08 VITALS — BP 128/64 | HR 117 | Temp 97.2°F

## 2011-08-08 DIAGNOSIS — C9110 Chronic lymphocytic leukemia of B-cell type not having achieved remission: Secondary | ICD-10-CM

## 2011-08-08 MED ORDER — PEGFILGRASTIM INJECTION 6 MG/0.6ML
6.0000 mg | Freq: Once | SUBCUTANEOUS | Status: AC
  Administered 2011-08-08: 6 mg via SUBCUTANEOUS

## 2011-08-11 NOTE — Telephone Encounter (Signed)
error 

## 2011-08-14 ENCOUNTER — Other Ambulatory Visit (HOSPITAL_BASED_OUTPATIENT_CLINIC_OR_DEPARTMENT_OTHER): Payer: Medicare Other

## 2011-08-14 ENCOUNTER — Other Ambulatory Visit: Payer: Self-pay | Admitting: Hematology and Oncology

## 2011-08-14 DIAGNOSIS — C8599 Non-Hodgkin lymphoma, unspecified, extranodal and solid organ sites: Secondary | ICD-10-CM

## 2011-08-14 DIAGNOSIS — C9110 Chronic lymphocytic leukemia of B-cell type not having achieved remission: Secondary | ICD-10-CM

## 2011-08-14 DIAGNOSIS — C19 Malignant neoplasm of rectosigmoid junction: Secondary | ICD-10-CM

## 2011-08-14 LAB — COMPREHENSIVE METABOLIC PANEL
ALT: 14 U/L (ref 0–53)
AST: 11 U/L (ref 0–37)
Albumin: 4.5 g/dL (ref 3.5–5.2)
Alkaline Phosphatase: 120 U/L — ABNORMAL HIGH (ref 39–117)
BUN: 18 mg/dL (ref 6–23)
CO2: 27 mEq/L (ref 19–32)
Calcium: 9.9 mg/dL (ref 8.4–10.5)
Chloride: 104 mEq/L (ref 96–112)
Creatinine, Ser: 1.13 mg/dL (ref 0.50–1.35)
Glucose, Bld: 189 mg/dL — ABNORMAL HIGH (ref 70–99)
Potassium: 4.5 mEq/L (ref 3.5–5.3)
Sodium: 140 mEq/L (ref 135–145)
Total Bilirubin: 1 mg/dL (ref 0.3–1.2)
Total Protein: 6.7 g/dL (ref 6.0–8.3)

## 2011-08-14 LAB — CBC WITH DIFFERENTIAL/PLATELET
BASO%: 0.4 % (ref 0.0–2.0)
Basophils Absolute: 0 10*3/uL (ref 0.0–0.1)
EOS%: 0.5 % (ref 0.0–7.0)
Eosinophils Absolute: 0 10*3/uL (ref 0.0–0.5)
HCT: 33.5 % — ABNORMAL LOW (ref 38.4–49.9)
HGB: 11.4 g/dL — ABNORMAL LOW (ref 13.0–17.1)
LYMPH%: 23.4 % (ref 14.0–49.0)
MCH: 30.6 pg (ref 27.2–33.4)
MCHC: 33.9 g/dL (ref 32.0–36.0)
MCV: 90.4 fL (ref 79.3–98.0)
MONO#: 0.2 10*3/uL (ref 0.1–0.9)
MONO%: 3.6 % (ref 0.0–14.0)
NEUT#: 4.1 10*3/uL (ref 1.5–6.5)
NEUT%: 72.1 % (ref 39.0–75.0)
Platelets: 99 10*3/uL — ABNORMAL LOW (ref 140–400)
RBC: 3.71 10*6/uL — ABNORMAL LOW (ref 4.20–5.82)
RDW: 13.9 % (ref 11.0–14.6)
WBC: 5.7 10*3/uL (ref 4.0–10.3)
lymph#: 1.3 10*3/uL (ref 0.9–3.3)

## 2011-09-01 ENCOUNTER — Other Ambulatory Visit: Payer: Self-pay | Admitting: Hematology and Oncology

## 2011-09-01 ENCOUNTER — Other Ambulatory Visit (HOSPITAL_BASED_OUTPATIENT_CLINIC_OR_DEPARTMENT_OTHER): Payer: Medicare Other | Admitting: Lab

## 2011-09-01 DIAGNOSIS — C9110 Chronic lymphocytic leukemia of B-cell type not having achieved remission: Secondary | ICD-10-CM

## 2011-09-01 DIAGNOSIS — D696 Thrombocytopenia, unspecified: Secondary | ICD-10-CM

## 2011-09-01 LAB — CBC WITH DIFFERENTIAL/PLATELET
BASO%: 0.4 % (ref 0.0–2.0)
Basophils Absolute: 0 10*3/uL (ref 0.0–0.1)
EOS%: 1.2 % (ref 0.0–7.0)
Eosinophils Absolute: 0.1 10*3/uL (ref 0.0–0.5)
HCT: 38.4 % (ref 38.4–49.9)
HGB: 13.2 g/dL (ref 13.0–17.1)
LYMPH%: 19.5 % (ref 14.0–49.0)
MCH: 31.3 pg (ref 27.2–33.4)
MCHC: 34.3 g/dL (ref 32.0–36.0)
MCV: 91.3 fL (ref 79.3–98.0)
MONO#: 0.4 10*3/uL (ref 0.1–0.9)
MONO%: 6.9 % (ref 0.0–14.0)
NEUT#: 4.1 10*3/uL (ref 1.5–6.5)
NEUT%: 72 % (ref 39.0–75.0)
Platelets: 156 10*3/uL (ref 140–400)
RBC: 4.2 10*6/uL (ref 4.20–5.82)
RDW: 16.6 % — ABNORMAL HIGH (ref 11.0–14.6)
WBC: 5.7 10*3/uL (ref 4.0–10.3)
lymph#: 1.1 10*3/uL (ref 0.9–3.3)

## 2011-09-01 LAB — COMPREHENSIVE METABOLIC PANEL
ALT: 15 U/L (ref 0–53)
AST: 17 U/L (ref 0–37)
Albumin: 4.6 g/dL (ref 3.5–5.2)
Alkaline Phosphatase: 89 U/L (ref 39–117)
BUN: 17 mg/dL (ref 6–23)
CO2: 28 mEq/L (ref 19–32)
Calcium: 9.6 mg/dL (ref 8.4–10.5)
Chloride: 104 mEq/L (ref 96–112)
Creatinine, Ser: 1.12 mg/dL (ref 0.50–1.35)
Glucose, Bld: 157 mg/dL — ABNORMAL HIGH (ref 70–99)
Potassium: 4.5 mEq/L (ref 3.5–5.3)
Sodium: 140 mEq/L (ref 135–145)
Total Bilirubin: 0.7 mg/dL (ref 0.3–1.2)
Total Protein: 6.9 g/dL (ref 6.0–8.3)

## 2011-09-01 LAB — URIC ACID: Uric Acid, Serum: 5.5 mg/dL (ref 4.0–7.8)

## 2011-09-07 ENCOUNTER — Other Ambulatory Visit: Payer: Self-pay | Admitting: Hematology and Oncology

## 2011-09-07 ENCOUNTER — Telehealth: Payer: Self-pay | Admitting: *Deleted

## 2011-09-08 ENCOUNTER — Encounter: Payer: Self-pay | Admitting: *Deleted

## 2011-09-09 ENCOUNTER — Other Ambulatory Visit: Payer: Self-pay | Admitting: Hematology and Oncology

## 2011-09-09 ENCOUNTER — Telehealth: Payer: Self-pay | Admitting: Hematology and Oncology

## 2011-09-09 ENCOUNTER — Ambulatory Visit (HOSPITAL_BASED_OUTPATIENT_CLINIC_OR_DEPARTMENT_OTHER): Payer: Medicare Other | Admitting: Hematology and Oncology

## 2011-09-09 ENCOUNTER — Ambulatory Visit (HOSPITAL_BASED_OUTPATIENT_CLINIC_OR_DEPARTMENT_OTHER): Payer: Medicare Other

## 2011-09-09 ENCOUNTER — Other Ambulatory Visit (HOSPITAL_BASED_OUTPATIENT_CLINIC_OR_DEPARTMENT_OTHER): Payer: Medicare Other | Admitting: Lab

## 2011-09-09 VITALS — BP 111/62 | HR 89 | Temp 97.6°F | Ht 67.5 in | Wt 145.9 lb

## 2011-09-09 VITALS — BP 104/68 | HR 78 | Temp 97.5°F

## 2011-09-09 DIAGNOSIS — C8589 Other specified types of non-Hodgkin lymphoma, extranodal and solid organ sites: Secondary | ICD-10-CM

## 2011-09-09 DIAGNOSIS — C9110 Chronic lymphocytic leukemia of B-cell type not having achieved remission: Secondary | ICD-10-CM

## 2011-09-09 DIAGNOSIS — Z5111 Encounter for antineoplastic chemotherapy: Secondary | ICD-10-CM

## 2011-09-09 DIAGNOSIS — C189 Malignant neoplasm of colon, unspecified: Secondary | ICD-10-CM

## 2011-09-09 DIAGNOSIS — Z5112 Encounter for antineoplastic immunotherapy: Secondary | ICD-10-CM

## 2011-09-09 LAB — CBC WITH DIFFERENTIAL/PLATELET
BASO%: 0.6 % (ref 0.0–2.0)
Basophils Absolute: 0 10*3/uL (ref 0.0–0.1)
EOS%: 4.9 % (ref 0.0–7.0)
Eosinophils Absolute: 0.3 10*3/uL (ref 0.0–0.5)
HCT: 37.2 % — ABNORMAL LOW (ref 38.4–49.9)
HGB: 12.7 g/dL — ABNORMAL LOW (ref 13.0–17.1)
LYMPH%: 23.3 % (ref 14.0–49.0)
MCH: 30.3 pg (ref 27.2–33.4)
MCHC: 34.1 g/dL (ref 32.0–36.0)
MCV: 88.8 fL (ref 79.3–98.0)
MONO#: 0.5 10*3/uL (ref 0.1–0.9)
MONO%: 10 % (ref 0.0–14.0)
NEUT#: 3.2 10*3/uL (ref 1.5–6.5)
NEUT%: 61.2 % (ref 39.0–75.0)
Platelets: 128 10*3/uL — ABNORMAL LOW (ref 140–400)
RBC: 4.19 10*6/uL — ABNORMAL LOW (ref 4.20–5.82)
RDW: 15 % — ABNORMAL HIGH (ref 11.0–14.6)
WBC: 5.3 10*3/uL (ref 4.0–10.3)
lymph#: 1.2 10*3/uL (ref 0.9–3.3)
nRBC: 0 % (ref 0–0)

## 2011-09-09 LAB — COMPREHENSIVE METABOLIC PANEL
ALT: 14 U/L (ref 0–53)
AST: 18 U/L (ref 0–37)
Albumin: 4.6 g/dL (ref 3.5–5.2)
Alkaline Phosphatase: 77 U/L (ref 39–117)
BUN: 15 mg/dL (ref 6–23)
CO2: 24 mEq/L (ref 19–32)
Calcium: 9.8 mg/dL (ref 8.4–10.5)
Chloride: 105 mEq/L (ref 96–112)
Creatinine, Ser: 1.01 mg/dL (ref 0.50–1.35)
Glucose, Bld: 118 mg/dL — ABNORMAL HIGH (ref 70–99)
Potassium: 3.9 mEq/L (ref 3.5–5.3)
Sodium: 139 mEq/L (ref 135–145)
Total Bilirubin: 1 mg/dL (ref 0.3–1.2)
Total Protein: 6.9 g/dL (ref 6.0–8.3)

## 2011-09-09 LAB — LACTATE DEHYDROGENASE: LDH: 131 U/L (ref 94–250)

## 2011-09-09 MED ORDER — SODIUM CHLORIDE 0.9 % IV SOLN
250.0000 mg/m2 | Freq: Once | INTRAVENOUS | Status: AC
  Administered 2011-09-09: 440 mg via INTRAVENOUS
  Filled 2011-09-09: qty 22

## 2011-09-09 MED ORDER — ACETAMINOPHEN 325 MG PO TABS
650.0000 mg | ORAL_TABLET | Freq: Once | ORAL | Status: AC
  Administered 2011-09-09: 650 mg via ORAL

## 2011-09-09 MED ORDER — SODIUM CHLORIDE 0.9 % IV SOLN
25.0000 mg/m2 | Freq: Once | INTRAVENOUS | Status: AC
  Administered 2011-09-09: 45 mg via INTRAVENOUS
  Filled 2011-09-09: qty 1.8

## 2011-09-09 MED ORDER — SODIUM CHLORIDE 0.9 % IV SOLN: 25.0000 mg/m2 | Freq: Once | INTRAVENOUS | Status: DC

## 2011-09-09 MED ORDER — SODIUM CHLORIDE 0.9 % IV SOLN: 375.0000 mg/m2 | Freq: Once | INTRAVENOUS | Status: DC

## 2011-09-09 MED ORDER — DIPHENHYDRAMINE HCL 25 MG PO CAPS
50.0000 mg | ORAL_CAPSULE | Freq: Once | ORAL | Status: AC
  Administered 2011-09-09: 50 mg via ORAL

## 2011-09-09 MED ORDER — SODIUM CHLORIDE 0.9 % IV SOLN
375.0000 mg/m2 | Freq: Once | INTRAVENOUS | Status: AC
  Administered 2011-09-09: 700 mg via INTRAVENOUS
  Filled 2011-09-09: qty 70

## 2011-09-09 MED ORDER — ONDANSETRON 8 MG/50ML IVPB (CHCC)
8.0000 mg | Freq: Once | INTRAVENOUS | Status: AC
  Administered 2011-09-09: 8 mg via INTRAVENOUS

## 2011-09-09 MED ORDER — SODIUM CHLORIDE 0.9 % IV SOLN: 250.0000 mg/m2 | Freq: Once | INTRAVENOUS | Status: DC

## 2011-09-09 MED ORDER — SODIUM CHLORIDE 0.9 % IV SOLN
Freq: Once | INTRAVENOUS | Status: AC
  Administered 2011-09-09: 10:00:00 via INTRAVENOUS

## 2011-09-09 NOTE — Progress Notes (Signed)
This office note has been dictated.

## 2011-09-09 NOTE — Telephone Encounter (Signed)
gve the pt's wife the dec-feb 2013 appt calendar

## 2011-09-09 NOTE — Progress Notes (Signed)
Rapid Rituxan.  VSS.  Rate increased to 225cc/hr for 225cc.

## 2011-09-09 NOTE — Progress Notes (Signed)
CC:   [name]. [name], M.D. [name]., M.D. [name], M.D.  IDENTIFYING STATEMENT:  The patient is a 70 year old man with CLL who presents for followup prior to chemotherapy.  INTERVAL HISTORY:  The patient received his 1st cycle of FCR on August 04, 2011.  He tolerated the therapy very well.  Had none of the usual side effects of therapy.  He has good energy levels.  He denies mucositis.  He is afebrile.  He has not noted any progressive adenopathy.  MEDICATIONS:  Allopurinol, Zofran, Zocor, Bactrim, Valtrex.  ALLERGIES:  None.  PAST MEDICAL HISTORY:  Unchanged.  FAMILY HISTORY:  Unchanged.  SOCIAL HISTORY:  Unchanged.  REVIEW OF SYSTEMS:  Ten-point review of systems negative.  PHYSICAL EXAMINATION:  General:  The patient is a well-appearing, well- nourished man in no distress.  Vitals:  Pulse 89, blood pressure 111/62, temperature 97.6, respirations 20, weight 145 pounds.  HEENT:  Head is atraumatic, normocephalic.  Sclerae are anicteric.  Mouth moist.  Neck: Supple.  Chest:  Clear to percussion and auscultation.  CVS:  First and second heart sounds present.  No added sounds or murmurs.  Abdomen: Soft, nontender.  Bowel sounds present.  Extremities:  No edema.  No calf tenderness.  CNS:  Nonfocal.  Lymph Nodes:  No adenopathy.  LABORATORY DATA:  09/09/2011 white cell count 5.3, hemoglobin 12.7, hematocrit 37.2, platelets 128, ANC 3200.  CMET pending.  IMPRESSION AND PLAN:  [name] is a 70 year old man with chronic lymphocytic leukemia/small lymphocytic lymphoma who had progressive adenopathy and thrombocytopenia with splenomegaly.  Tumor was negative for deletion of 17p and 11q.  He is status post cycle 1 of FCR on August 04, 2011.  His current exam and labs are unremarkable.  He will proceed for cycle 2 following clinic.  He follows up in a month's time prior to cycle 3.  He is to discontinue allopurinol, but continue with Bactrim and Valtrex.   I spent more than half the time coordinating care.   ______________________________ [name], M.D. LIO/MEDQ  D:  09/09/2011  T:  09/09/2011  Job:  [id number]

## 2011-09-10 ENCOUNTER — Ambulatory Visit (HOSPITAL_BASED_OUTPATIENT_CLINIC_OR_DEPARTMENT_OTHER): Payer: Medicare Other

## 2011-09-10 VITALS — BP 114/62 | HR 85 | Temp 97.5°F

## 2011-09-10 DIAGNOSIS — Z5111 Encounter for antineoplastic chemotherapy: Secondary | ICD-10-CM

## 2011-09-10 DIAGNOSIS — C9110 Chronic lymphocytic leukemia of B-cell type not having achieved remission: Secondary | ICD-10-CM

## 2011-09-10 MED ORDER — ONDANSETRON 8 MG/50ML IVPB (CHCC)
8.0000 mg | Freq: Once | INTRAVENOUS | Status: AC
  Administered 2011-09-10: 8 mg via INTRAVENOUS

## 2011-09-10 MED ORDER — SODIUM CHLORIDE 0.9 % IV SOLN
25.0000 mg/m2 | Freq: Once | INTRAVENOUS | Status: AC
  Administered 2011-09-10: 45 mg via INTRAVENOUS
  Filled 2011-09-10: qty 1.8

## 2011-09-10 MED ORDER — SODIUM CHLORIDE 0.9 % IJ SOLN
3.0000 mL | INTRAMUSCULAR | Status: DC | PRN
  Filled 2011-09-10: qty 10

## 2011-09-10 MED ORDER — SODIUM CHLORIDE 0.9 % IV SOLN
250.0000 mg/m2 | Freq: Once | INTRAVENOUS | Status: AC
  Administered 2011-09-10: 440 mg via INTRAVENOUS
  Filled 2011-09-10: qty 22

## 2011-09-10 MED ORDER — SODIUM CHLORIDE 0.9 % IV SOLN
Freq: Once | INTRAVENOUS | Status: DC
Start: 2011-09-10 — End: 2011-09-10

## 2011-09-10 NOTE — Patient Instructions (Signed)
Patient ambulatory out of clinic with family member.  Patient had no complaints during or after treatment.  Instructed to call with any issues.  Patient aware of next appointment.

## 2011-09-11 ENCOUNTER — Ambulatory Visit (HOSPITAL_BASED_OUTPATIENT_CLINIC_OR_DEPARTMENT_OTHER): Payer: Medicare Other

## 2011-09-11 DIAGNOSIS — C8589 Other specified types of non-Hodgkin lymphoma, extranodal and solid organ sites: Secondary | ICD-10-CM

## 2011-09-11 DIAGNOSIS — C9110 Chronic lymphocytic leukemia of B-cell type not having achieved remission: Secondary | ICD-10-CM

## 2011-09-11 DIAGNOSIS — Z5111 Encounter for antineoplastic chemotherapy: Secondary | ICD-10-CM

## 2011-09-11 MED ORDER — SODIUM CHLORIDE 0.9 % IV SOLN
25.0000 mg/m2 | Freq: Once | INTRAVENOUS | Status: AC
  Administered 2011-09-11: 45 mg via INTRAVENOUS
  Filled 2011-09-11: qty 1.8

## 2011-09-11 MED ORDER — ONDANSETRON 8 MG/50ML IVPB (CHCC)
8.0000 mg | Freq: Once | INTRAVENOUS | Status: AC
  Administered 2011-09-11: 8 mg via INTRAVENOUS

## 2011-09-11 MED ORDER — SODIUM CHLORIDE 0.9 % IV SOLN
Freq: Once | INTRAVENOUS | Status: AC
  Administered 2011-09-11: 13:00:00 via INTRAVENOUS

## 2011-09-11 MED ORDER — SODIUM CHLORIDE 0.9 % IV SOLN
250.0000 mg/m2 | Freq: Once | INTRAVENOUS | Status: AC
  Administered 2011-09-11: 440 mg via INTRAVENOUS
  Filled 2011-09-11: qty 22

## 2011-09-12 ENCOUNTER — Ambulatory Visit (HOSPITAL_BASED_OUTPATIENT_CLINIC_OR_DEPARTMENT_OTHER): Payer: No Typology Code available for payment source

## 2011-09-12 VITALS — BP 125/56 | HR 96 | Temp 97.2°F

## 2011-09-12 DIAGNOSIS — C8589 Other specified types of non-Hodgkin lymphoma, extranodal and solid organ sites: Secondary | ICD-10-CM

## 2011-09-12 DIAGNOSIS — C9110 Chronic lymphocytic leukemia of B-cell type not having achieved remission: Secondary | ICD-10-CM

## 2011-09-12 DIAGNOSIS — Z5189 Encounter for other specified aftercare: Secondary | ICD-10-CM

## 2011-09-12 MED ORDER — PEGFILGRASTIM INJECTION 6 MG/0.6ML
6.0000 mg | Freq: Once | SUBCUTANEOUS | Status: AC
  Administered 2011-09-12: 6 mg via SUBCUTANEOUS

## 2011-10-07 ENCOUNTER — Ambulatory Visit (HOSPITAL_BASED_OUTPATIENT_CLINIC_OR_DEPARTMENT_OTHER): Payer: Medicare Other

## 2011-10-07 ENCOUNTER — Other Ambulatory Visit (HOSPITAL_BASED_OUTPATIENT_CLINIC_OR_DEPARTMENT_OTHER): Payer: Medicare Other | Admitting: Lab

## 2011-10-07 ENCOUNTER — Ambulatory Visit (HOSPITAL_BASED_OUTPATIENT_CLINIC_OR_DEPARTMENT_OTHER): Payer: Medicare Other | Admitting: Hematology and Oncology

## 2011-10-07 VITALS — BP 123/68 | HR 77 | Temp 97.0°F

## 2011-10-07 VITALS — BP 138/61 | HR 93 | Temp 98.0°F | Ht 67.5 in | Wt 144.3 lb

## 2011-10-07 DIAGNOSIS — C8599 Non-Hodgkin lymphoma, unspecified, extranodal and solid organ sites: Secondary | ICD-10-CM | POA: Diagnosis not present

## 2011-10-07 DIAGNOSIS — C9110 Chronic lymphocytic leukemia of B-cell type not having achieved remission: Secondary | ICD-10-CM

## 2011-10-07 DIAGNOSIS — Z5111 Encounter for antineoplastic chemotherapy: Secondary | ICD-10-CM | POA: Diagnosis not present

## 2011-10-07 DIAGNOSIS — C189 Malignant neoplasm of colon, unspecified: Secondary | ICD-10-CM

## 2011-10-07 DIAGNOSIS — D696 Thrombocytopenia, unspecified: Secondary | ICD-10-CM | POA: Diagnosis not present

## 2011-10-07 DIAGNOSIS — R599 Enlarged lymph nodes, unspecified: Secondary | ICD-10-CM | POA: Diagnosis not present

## 2011-10-07 DIAGNOSIS — C8589 Other specified types of non-Hodgkin lymphoma, extranodal and solid organ sites: Secondary | ICD-10-CM

## 2011-10-07 LAB — CBC WITH DIFFERENTIAL/PLATELET
BASO%: 2 % (ref 0.0–2.0)
Basophils Absolute: 0.1 10*3/uL (ref 0.0–0.1)
EOS%: 11.9 % — ABNORMAL HIGH (ref 0.0–7.0)
Eosinophils Absolute: 0.4 10*3/uL (ref 0.0–0.5)
HCT: 36.6 % — ABNORMAL LOW (ref 38.4–49.9)
HGB: 12.6 g/dL — ABNORMAL LOW (ref 13.0–17.1)
LYMPH%: 21 % (ref 14.0–49.0)
MCH: 30.5 pg (ref 27.2–33.4)
MCHC: 34.4 g/dL (ref 32.0–36.0)
MCV: 88.6 fL (ref 79.3–98.0)
MONO#: 0.3 10*3/uL (ref 0.1–0.9)
MONO%: 9.7 % (ref 0.0–14.0)
NEUT#: 2 10*3/uL (ref 1.5–6.5)
NEUT%: 55.4 % (ref 39.0–75.0)
Platelets: 147 10*3/uL (ref 140–400)
RBC: 4.13 10*6/uL — ABNORMAL LOW (ref 4.20–5.82)
RDW: 14.6 % (ref 11.0–14.6)
WBC: 3.5 10*3/uL — ABNORMAL LOW (ref 4.0–10.3)
lymph#: 0.7 10*3/uL — ABNORMAL LOW (ref 0.9–3.3)
nRBC: 0 % (ref 0–0)

## 2011-10-07 LAB — COMPREHENSIVE METABOLIC PANEL
ALT: 15 U/L (ref 0–53)
AST: 17 U/L (ref 0–37)
Albumin: 4.7 g/dL (ref 3.5–5.2)
Alkaline Phosphatase: 95 U/L (ref 39–117)
BUN: 19 mg/dL (ref 6–23)
CO2: 26 mEq/L (ref 19–32)
Calcium: 9.4 mg/dL (ref 8.4–10.5)
Chloride: 104 mEq/L (ref 96–112)
Creatinine, Ser: 1.11 mg/dL (ref 0.50–1.35)
Glucose, Bld: 124 mg/dL — ABNORMAL HIGH (ref 70–99)
Potassium: 3.9 mEq/L (ref 3.5–5.3)
Sodium: 139 mEq/L (ref 135–145)
Total Bilirubin: 1 mg/dL (ref 0.3–1.2)
Total Protein: 6.9 g/dL (ref 6.0–8.3)

## 2011-10-07 MED ORDER — SODIUM CHLORIDE 0.9 % IV SOLN
250.0000 mg/m2 | Freq: Once | INTRAVENOUS | Status: AC
  Administered 2011-10-07: 440 mg via INTRAVENOUS
  Filled 2011-10-07: qty 22

## 2011-10-07 MED ORDER — ONDANSETRON 8 MG/50ML IVPB (CHCC)
8.0000 mg | Freq: Once | INTRAVENOUS | Status: AC
  Administered 2011-10-07: 8 mg via INTRAVENOUS

## 2011-10-07 MED ORDER — DIPHENHYDRAMINE HCL 25 MG PO CAPS
50.0000 mg | ORAL_CAPSULE | Freq: Once | ORAL | Status: AC
  Administered 2011-10-07: 50 mg via ORAL

## 2011-10-07 MED ORDER — ACETAMINOPHEN 325 MG PO TABS
650.0000 mg | ORAL_TABLET | Freq: Once | ORAL | Status: AC
  Administered 2011-10-07: 650 mg via ORAL

## 2011-10-07 MED ORDER — SODIUM CHLORIDE 0.9 % IV SOLN
Freq: Once | INTRAVENOUS | Status: AC
  Administered 2011-10-07: 10:00:00 via INTRAVENOUS

## 2011-10-07 MED ORDER — SODIUM CHLORIDE 0.9 % IV SOLN
25.0000 mg/m2 | Freq: Once | INTRAVENOUS | Status: AC
  Administered 2011-10-07: 45 mg via INTRAVENOUS
  Filled 2011-10-07: qty 1.8

## 2011-10-07 MED ORDER — SODIUM CHLORIDE 0.9 % IV SOLN
700.0000 mg | Freq: Once | INTRAVENOUS | Status: AC
  Administered 2011-10-07: 700 mg via INTRAVENOUS
  Filled 2011-10-07: qty 70

## 2011-10-07 NOTE — Progress Notes (Signed)
CC:   [name], M.D. [name] Flatten., M.D. [name]. [name], M.D.  IDENTIFYING STATEMENT:  The patient is a 71 year old man with CLL who presents for followup prior to chemotherapy.  INTERVAL HISTORY:  [name] continues to tolerate systemic chemotherapy.  He has minimal complaints.  He has an excellent appetite. He denies pain.  He denies adenopathy.  His weight is stable.  MEDICATIONS:  Reviewed and updated, including Bactrim and Valtrex.  ALLERGIES:  None.  PAST MEDICAL HISTORY:  Unchanged.  FAMILY/SOCIAL HISTORY:  Unchanged.  REVIEW OF SYSTEMS:  A 10-point review of systems was negative.  PHYSICAL EXAMINATION:  General:  The patient is a well-appearing, well- nourished man in no distress.  Vital Signs:  Pulse 93, blood pressure 138/61, temperature 98, respirations 20, weight 144.3 pounds.  HEENT: Head is atraumatic and normocephalic.  Sclerae anicteric.  Mouth moist. Chest:  Clear.  CVS:  Unremarkable.  Abdomen:  Soft and nontender. Bowel sounds are present.  Extremities:  No calf tenderness.  Lymph nodes:  No adenopathy.  CNS:  Nonfocal.  LABORATORY DATA:  On 10/07/2011, white cell count 3.5, hemoglobin 12.6, hematocrit 36.6, platelets 147.  C-Met pending.  IMPRESSION AND PLAN:  [name] is a 71 year old man with chronic lymphocytic leukemia/small lymphocytic lymphoma who has had progressive adenopathy and thrombocytopenia with splenomegaly.  Tumor was negative for deletion of 17p and 11q.  He is status post 2 cycles of FCR beginning on August 04, 2011.  His current exam and labs are unremarkable.  He will proceed with cycle 3.  He follows up in a month's time.  TIME SPENT:  I spent more than half the time coordinating care.    ______________________________ [name], M.D. LIO/MEDQ  D:  10/07/2011  T:  10/07/2011  Job:  [id number]

## 2011-10-07 NOTE — Patient Instructions (Signed)
Patient ambulatory out of clinic, without complaints.  Instructed patient to call with any issues. Patient aware of next appointment

## 2011-10-07 NOTE — Progress Notes (Signed)
This office note has been dictated.

## 2011-10-08 ENCOUNTER — Ambulatory Visit (HOSPITAL_BASED_OUTPATIENT_CLINIC_OR_DEPARTMENT_OTHER): Payer: PRIVATE HEALTH INSURANCE

## 2011-10-08 ENCOUNTER — Other Ambulatory Visit: Payer: Self-pay | Admitting: *Deleted

## 2011-10-08 VITALS — BP 108/69 | HR 69 | Temp 97.4°F

## 2011-10-08 DIAGNOSIS — C9110 Chronic lymphocytic leukemia of B-cell type not having achieved remission: Secondary | ICD-10-CM

## 2011-10-08 MED ORDER — SODIUM CHLORIDE 0.9 % IV SOLN: Freq: Once | INTRAVENOUS | Status: DC

## 2011-10-08 MED ORDER — FLUDARABINE PHOSPHATE CHEMO INJECTION 50 MG
25.0000 mg/m2 | Freq: Once | INTRAVENOUS | Status: AC
  Administered 2011-10-08: 45 mg via INTRAVENOUS
  Filled 2011-10-08: qty 1.8

## 2011-10-08 MED ORDER — ONDANSETRON 8 MG/50ML IVPB (CHCC): 8.0000 mg | Freq: Once | INTRAVENOUS | Status: DC

## 2011-10-08 MED ORDER — SODIUM CHLORIDE 0.9 % IV SOLN
250.0000 mg/m2 | Freq: Once | INTRAVENOUS | Status: AC
  Administered 2011-10-08: 440 mg via INTRAVENOUS
  Filled 2011-10-08: qty 22

## 2011-10-09 ENCOUNTER — Ambulatory Visit (HOSPITAL_BASED_OUTPATIENT_CLINIC_OR_DEPARTMENT_OTHER): Payer: Medicare Other

## 2011-10-09 VITALS — BP 108/73 | HR 85 | Temp 97.3°F

## 2011-10-09 DIAGNOSIS — C8589 Other specified types of non-Hodgkin lymphoma, extranodal and solid organ sites: Secondary | ICD-10-CM | POA: Diagnosis not present

## 2011-10-09 DIAGNOSIS — Z5111 Encounter for antineoplastic chemotherapy: Secondary | ICD-10-CM | POA: Diagnosis not present

## 2011-10-09 DIAGNOSIS — C9110 Chronic lymphocytic leukemia of B-cell type not having achieved remission: Secondary | ICD-10-CM

## 2011-10-09 MED ORDER — SODIUM CHLORIDE 0.9 % IV SOLN
Freq: Once | INTRAVENOUS | Status: AC
  Administered 2011-10-09: 12:00:00 via INTRAVENOUS

## 2011-10-09 MED ORDER — SODIUM CHLORIDE 0.9 % IV SOLN
25.0000 mg/m2 | Freq: Once | INTRAVENOUS | Status: AC
  Administered 2011-10-09: 45 mg via INTRAVENOUS
  Filled 2011-10-09: qty 1.8

## 2011-10-09 MED ORDER — SODIUM CHLORIDE 0.9 % IJ SOLN
10.0000 mL | INTRAMUSCULAR | Status: DC | PRN
  Filled 2011-10-09: qty 10

## 2011-10-09 MED ORDER — HEPARIN SOD (PORK) LOCK FLUSH 100 UNIT/ML IV SOLN
500.0000 [IU] | Freq: Once | INTRAVENOUS | Status: DC | PRN
  Filled 2011-10-09: qty 5

## 2011-10-09 MED ORDER — SODIUM CHLORIDE 0.9 % IV SOLN
250.0000 mg/m2 | Freq: Once | INTRAVENOUS | Status: AC
  Administered 2011-10-09: 440 mg via INTRAVENOUS
  Filled 2011-10-09: qty 22

## 2011-10-09 MED ORDER — ONDANSETRON 8 MG/50ML IVPB (CHCC)
8.0000 mg | Freq: Once | INTRAVENOUS | Status: AC
  Administered 2011-10-09: 8 mg via INTRAVENOUS

## 2011-10-09 NOTE — Patient Instructions (Signed)
PT D/C AMBULATORY WITH WIFE. PT TO CALL WITH CONCERNS

## 2011-10-10 ENCOUNTER — Ambulatory Visit (HOSPITAL_BASED_OUTPATIENT_CLINIC_OR_DEPARTMENT_OTHER): Payer: Medicare Other

## 2011-10-10 VITALS — BP 113/70 | HR 90 | Temp 97.0°F

## 2011-10-10 DIAGNOSIS — C8589 Other specified types of non-Hodgkin lymphoma, extranodal and solid organ sites: Secondary | ICD-10-CM | POA: Diagnosis not present

## 2011-10-10 DIAGNOSIS — C9110 Chronic lymphocytic leukemia of B-cell type not having achieved remission: Secondary | ICD-10-CM | POA: Diagnosis not present

## 2011-10-10 MED ORDER — PEGFILGRASTIM INJECTION 6 MG/0.6ML
6.0000 mg | Freq: Once | SUBCUTANEOUS | Status: AC
  Administered 2011-10-10: 6 mg via SUBCUTANEOUS

## 2011-11-02 ENCOUNTER — Other Ambulatory Visit: Payer: Self-pay | Admitting: Hematology and Oncology

## 2011-11-03 ENCOUNTER — Ambulatory Visit (HOSPITAL_BASED_OUTPATIENT_CLINIC_OR_DEPARTMENT_OTHER): Payer: Medicare Other | Admitting: Hematology and Oncology

## 2011-11-03 ENCOUNTER — Other Ambulatory Visit (HOSPITAL_BASED_OUTPATIENT_CLINIC_OR_DEPARTMENT_OTHER): Payer: Medicare Other | Admitting: Lab

## 2011-11-03 VITALS — BP 114/54 | HR 101 | Temp 97.6°F | Ht 67.5 in | Wt 142.2 lb

## 2011-11-03 DIAGNOSIS — C8599 Non-Hodgkin lymphoma, unspecified, extranodal and solid organ sites: Secondary | ICD-10-CM | POA: Diagnosis not present

## 2011-11-03 DIAGNOSIS — C9110 Chronic lymphocytic leukemia of B-cell type not having achieved remission: Secondary | ICD-10-CM

## 2011-11-03 LAB — COMPREHENSIVE METABOLIC PANEL
ALT: 15 U/L (ref 0–53)
AST: 14 U/L (ref 0–37)
Albumin: 4.5 g/dL (ref 3.5–5.2)
Alkaline Phosphatase: 103 U/L (ref 39–117)
BUN: 16 mg/dL (ref 6–23)
CO2: 27 mEq/L (ref 19–32)
Calcium: 10 mg/dL (ref 8.4–10.5)
Chloride: 104 mEq/L (ref 96–112)
Creatinine, Ser: 1.14 mg/dL (ref 0.50–1.35)
Glucose, Bld: 107 mg/dL — ABNORMAL HIGH (ref 70–99)
Potassium: 4.5 mEq/L (ref 3.5–5.3)
Sodium: 140 mEq/L (ref 135–145)
Total Bilirubin: 0.8 mg/dL (ref 0.3–1.2)
Total Protein: 6.9 g/dL (ref 6.0–8.3)

## 2011-11-03 LAB — CBC WITH DIFFERENTIAL/PLATELET
BASO%: 0.8 % (ref 0.0–2.0)
Basophils Absolute: 0 10*3/uL (ref 0.0–0.1)
EOS%: 9.2 % — ABNORMAL HIGH (ref 0.0–7.0)
Eosinophils Absolute: 0.5 10*3/uL (ref 0.0–0.5)
HCT: 36.9 % — ABNORMAL LOW (ref 38.4–49.9)
HGB: 12.6 g/dL — ABNORMAL LOW (ref 13.0–17.1)
LYMPH%: 12.7 % — ABNORMAL LOW (ref 14.0–49.0)
MCH: 31.3 pg (ref 27.2–33.4)
MCHC: 34.2 g/dL (ref 32.0–36.0)
MCV: 91.6 fL (ref 79.3–98.0)
MONO#: 0.7 10*3/uL (ref 0.1–0.9)
MONO%: 13.3 % (ref 0.0–14.0)
NEUT#: 3.3 10*3/uL (ref 1.5–6.5)
NEUT%: 64 % (ref 39.0–75.0)
Platelets: 176 10*3/uL (ref 140–400)
RBC: 4.03 10*6/uL — ABNORMAL LOW (ref 4.20–5.82)
RDW: 16 % — ABNORMAL HIGH (ref 11.0–14.6)
WBC: 5.2 10*3/uL (ref 4.0–10.3)
lymph#: 0.7 10*3/uL — ABNORMAL LOW (ref 0.9–3.3)

## 2011-11-03 NOTE — Progress Notes (Signed)
This office note has been dictated.

## 2011-11-03 NOTE — Progress Notes (Signed)
CC:   [name], M.D. [name], M.D. Edsel Petrin. [name], M.D.  IDENTIFYING STATEMENT:  Patient is a 71 year old man with CLL who presents for followup prior to chemotherapy.  HISTORY:  Mr. Steenstra has no issues or concerns with therapy.  He has no nausea, vomiting, or abdominal pain.  Weight continues to be stable.  He denies fever, chills, or night sweats.  Denies mucositis.  MEDICATIONS:  Reviewed and updated.  Continues on Bactrim and Valtrex.  ALLERGIES:  None.  PAST MEDICAL HISTORY:  Unchanged.  FAMILY HISTORY:  Unchanged.  SOCIAL HISTORY:  Unchanged.  REVIEW OF SYSTEMS:  Ten-point review of systems negative.  PHYSICAL EXAM:  General:  The patient is alert and oriented x3.  Vitals: Pulse 101, blood pressure 104/54, temperature 97.6, respirations 20, weight 142 pounds.  HEENT:  Head is atraumatic, normocephalic.  Sclerae anicteric.  Mouth moist.  Chest:  Clear.  CVS:  Unremarkable.  Abdomen: Soft, nontender.  Bowel sounds present.  Extremities:  No calf tenderness.  Pulses present and symmetrical.  Lymph Nodes:  No adenopathy.  CNS:  Nonfocal.  LAB DATA:  11/03/2011 white count 5.2, hemoglobin 12.6, hematocrit 36.9, platelets 176.  Smear pending.  IMPRESSION AND PLAN:  Mr. Kerns is a 71 year old man with chronic lymphocytic leukemia/small lymphocytic lymphoma, with a history of progressive adenopathy and thrombocytopenia with splenomegaly.  His tumor was negative for deletion 17p, 11q.  Status post 3 cycles of FCR beginning August 04, 2011.  He has tolerated therapy well thus far. His labs are adequate.  He will proceed for cycle 4.  No changes were made to his current plan.  He follows up in a month's time.  I spent more than half the time coordinating care.    ______________________________ Nira Retort, M.D. LIO/MEDQ  D:  11/03/2011  T:  11/03/2011  Job:  [id number]

## 2011-11-04 ENCOUNTER — Ambulatory Visit (HOSPITAL_BASED_OUTPATIENT_CLINIC_OR_DEPARTMENT_OTHER): Payer: Medicare Other

## 2011-11-04 VITALS — BP 111/65 | HR 70 | Temp 98.4°F

## 2011-11-04 DIAGNOSIS — C9110 Chronic lymphocytic leukemia of B-cell type not having achieved remission: Secondary | ICD-10-CM

## 2011-11-04 MED ORDER — ONDANSETRON 8 MG/50ML IVPB (CHCC)
8.0000 mg | Freq: Once | INTRAVENOUS | Status: AC
  Administered 2011-11-04: 8 mg via INTRAVENOUS

## 2011-11-04 MED ORDER — FLUDARABINE PHOSPHATE CHEMO INJECTION 50 MG
25.0000 mg/m2 | Freq: Once | INTRAVENOUS | Status: AC
  Administered 2011-11-04: 45 mg via INTRAVENOUS
  Filled 2011-11-04: qty 1.8

## 2011-11-04 MED ORDER — DIPHENHYDRAMINE HCL 25 MG PO CAPS
50.0000 mg | ORAL_CAPSULE | Freq: Once | ORAL | Status: AC
  Administered 2011-11-04: 50 mg via ORAL

## 2011-11-04 MED ORDER — SODIUM CHLORIDE 0.9 % IV SOLN
Freq: Once | INTRAVENOUS | Status: AC
  Administered 2011-11-04: 12:00:00 via INTRAVENOUS

## 2011-11-04 MED ORDER — SODIUM CHLORIDE 0.9 % IV SOLN
250.0000 mg/m2 | Freq: Once | INTRAVENOUS | Status: AC
  Administered 2011-11-04: 440 mg via INTRAVENOUS
  Filled 2011-11-04: qty 22

## 2011-11-04 MED ORDER — SODIUM CHLORIDE 0.9 % IV SOLN
325.0000 mg/m2 | Freq: Once | INTRAVENOUS | Status: AC
  Administered 2011-11-04: 700 mg via INTRAVENOUS
  Filled 2011-11-04: qty 70

## 2011-11-04 MED ORDER — ACETAMINOPHEN 325 MG PO TABS
650.0000 mg | ORAL_TABLET | Freq: Once | ORAL | Status: AC
  Administered 2011-11-04: 650 mg via ORAL

## 2011-11-04 NOTE — Patient Instructions (Signed)
Patient discharged home with wife and no c/o pain; tolerated treatment well; meds for nausea and pain at home if needed.

## 2011-11-05 ENCOUNTER — Ambulatory Visit (HOSPITAL_BASED_OUTPATIENT_CLINIC_OR_DEPARTMENT_OTHER): Payer: Medicare Other

## 2011-11-05 ENCOUNTER — Other Ambulatory Visit: Payer: Self-pay | Admitting: Certified Registered Nurse Anesthetist

## 2011-11-05 VITALS — BP 128/67 | HR 94 | Temp 97.2°F

## 2011-11-05 DIAGNOSIS — C9110 Chronic lymphocytic leukemia of B-cell type not having achieved remission: Secondary | ICD-10-CM

## 2011-11-05 DIAGNOSIS — Z5111 Encounter for antineoplastic chemotherapy: Secondary | ICD-10-CM | POA: Diagnosis not present

## 2011-11-05 MED ORDER — SODIUM CHLORIDE 0.9 % IV SOLN
Freq: Once | INTRAVENOUS | Status: AC
  Administered 2011-11-05: 12:00:00 via INTRAVENOUS

## 2011-11-05 MED ORDER — ONDANSETRON 8 MG/50ML IVPB (CHCC)
8.0000 mg | Freq: Once | INTRAVENOUS | Status: AC
  Administered 2011-11-05: 8 mg via INTRAVENOUS

## 2011-11-05 MED ORDER — FLUDARABINE PHOSPHATE CHEMO INJECTION 50 MG
25.0000 mg/m2 | Freq: Once | INTRAVENOUS | Status: AC
  Administered 2011-11-05: 45 mg via INTRAVENOUS
  Filled 2011-11-05: qty 1.8

## 2011-11-05 MED ORDER — SODIUM CHLORIDE 0.9 % IV SOLN
250.0000 mg/m2 | Freq: Once | INTRAVENOUS | Status: AC
  Administered 2011-11-05: 440 mg via INTRAVENOUS
  Filled 2011-11-05: qty 22

## 2011-11-05 NOTE — Patient Instructions (Signed)
Call md for problems.  D Lorain Fettes rn

## 2011-11-06 ENCOUNTER — Ambulatory Visit (HOSPITAL_BASED_OUTPATIENT_CLINIC_OR_DEPARTMENT_OTHER): Payer: Medicare Other

## 2011-11-06 VITALS — BP 137/77 | HR 98 | Temp 97.7°F

## 2011-11-06 DIAGNOSIS — C9110 Chronic lymphocytic leukemia of B-cell type not having achieved remission: Secondary | ICD-10-CM

## 2011-11-06 DIAGNOSIS — Z5111 Encounter for antineoplastic chemotherapy: Secondary | ICD-10-CM | POA: Diagnosis not present

## 2011-11-06 MED ORDER — SODIUM CHLORIDE 0.9 % IV SOLN
25.0000 mg/m2 | Freq: Once | INTRAVENOUS | Status: AC
  Administered 2011-11-06: 45 mg via INTRAVENOUS
  Filled 2011-11-06: qty 1.8

## 2011-11-06 MED ORDER — CYCLOPHOSPHAMIDE CHEMO INJECTION 1 GM
250.0000 mg/m2 | Freq: Once | INTRAMUSCULAR | Status: AC
  Administered 2011-11-06: 440 mg via INTRAVENOUS
  Filled 2011-11-06: qty 22

## 2011-11-06 MED ORDER — ONDANSETRON 8 MG/50ML IVPB (CHCC)
8.0000 mg | Freq: Once | INTRAVENOUS | Status: AC
  Administered 2011-11-06: 8 mg via INTRAVENOUS

## 2011-11-06 MED ORDER — SODIUM CHLORIDE 0.9 % IV SOLN
Freq: Once | INTRAVENOUS | Status: AC
  Administered 2011-11-06: 12:00:00 via INTRAVENOUS

## 2011-11-07 ENCOUNTER — Ambulatory Visit (HOSPITAL_BASED_OUTPATIENT_CLINIC_OR_DEPARTMENT_OTHER): Payer: Medicare Other

## 2011-11-07 VITALS — BP 122/68 | HR 76 | Temp 98.0°F

## 2011-11-07 DIAGNOSIS — C9110 Chronic lymphocytic leukemia of B-cell type not having achieved remission: Secondary | ICD-10-CM

## 2011-11-07 MED ORDER — PEGFILGRASTIM INJECTION 6 MG/0.6ML
6.0000 mg | Freq: Once | SUBCUTANEOUS | Status: AC
  Administered 2011-11-07: 6 mg via SUBCUTANEOUS

## 2011-11-07 NOTE — Patient Instructions (Signed)
Pt ambulates out of dept. Aware of next appt. dph

## 2011-11-26 ENCOUNTER — Other Ambulatory Visit: Payer: Self-pay | Admitting: Hematology and Oncology

## 2011-12-01 ENCOUNTER — Telehealth: Payer: Self-pay | Admitting: *Deleted

## 2011-12-01 ENCOUNTER — Ambulatory Visit (HOSPITAL_BASED_OUTPATIENT_CLINIC_OR_DEPARTMENT_OTHER): Payer: Medicare Other | Admitting: Lab

## 2011-12-01 ENCOUNTER — Ambulatory Visit (HOSPITAL_BASED_OUTPATIENT_CLINIC_OR_DEPARTMENT_OTHER): Payer: Medicare Other | Admitting: Hematology and Oncology

## 2011-12-01 ENCOUNTER — Telehealth: Payer: Self-pay | Admitting: Hematology and Oncology

## 2011-12-01 ENCOUNTER — Encounter: Payer: Self-pay | Admitting: Hematology and Oncology

## 2011-12-01 ENCOUNTER — Other Ambulatory Visit: Payer: Medicare Other | Admitting: Lab

## 2011-12-01 VITALS — BP 123/65 | HR 100 | Temp 97.3°F | Ht 67.5 in | Wt 142.4 lb

## 2011-12-01 DIAGNOSIS — C9110 Chronic lymphocytic leukemia of B-cell type not having achieved remission: Secondary | ICD-10-CM | POA: Diagnosis not present

## 2011-12-01 DIAGNOSIS — C8599 Non-Hodgkin lymphoma, unspecified, extranodal and solid organ sites: Secondary | ICD-10-CM | POA: Diagnosis not present

## 2011-12-01 LAB — CBC WITH DIFFERENTIAL/PLATELET
BASO%: 0.9 % (ref 0.0–2.0)
Basophils Absolute: 0 10*3/uL (ref 0.0–0.1)
EOS%: 11.6 % — ABNORMAL HIGH (ref 0.0–7.0)
Eosinophils Absolute: 0.5 10*3/uL (ref 0.0–0.5)
HCT: 35.1 % — ABNORMAL LOW (ref 38.4–49.9)
HGB: 12 g/dL — ABNORMAL LOW (ref 13.0–17.1)
LYMPH%: 16 % (ref 14.0–49.0)
MCH: 31.6 pg (ref 27.2–33.4)
MCHC: 34.3 g/dL (ref 32.0–36.0)
MCV: 92.1 fL (ref 79.3–98.0)
MONO#: 0.5 10*3/uL (ref 0.1–0.9)
MONO%: 11.5 % (ref 0.0–14.0)
NEUT#: 2.4 10*3/uL (ref 1.5–6.5)
NEUT%: 60 % (ref 39.0–75.0)
Platelets: 154 10*3/uL (ref 140–400)
RBC: 3.81 10*6/uL — ABNORMAL LOW (ref 4.20–5.82)
RDW: 17 % — ABNORMAL HIGH (ref 11.0–14.6)
WBC: 4 10*3/uL (ref 4.0–10.3)
lymph#: 0.6 10*3/uL — ABNORMAL LOW (ref 0.9–3.3)

## 2011-12-01 LAB — COMPREHENSIVE METABOLIC PANEL
ALT: 18 U/L (ref 0–53)
AST: 14 U/L (ref 0–37)
Albumin: 4.4 g/dL (ref 3.5–5.2)
Alkaline Phosphatase: 108 U/L (ref 39–117)
BUN: 19 mg/dL (ref 6–23)
CO2: 24 mEq/L (ref 19–32)
Calcium: 9.8 mg/dL (ref 8.4–10.5)
Chloride: 107 mEq/L (ref 96–112)
Creatinine, Ser: 1.13 mg/dL (ref 0.50–1.35)
Glucose, Bld: 143 mg/dL — ABNORMAL HIGH (ref 70–99)
Potassium: 4.3 mEq/L (ref 3.5–5.3)
Sodium: 141 mEq/L (ref 135–145)
Total Bilirubin: 0.9 mg/dL (ref 0.3–1.2)
Total Protein: 6.8 g/dL (ref 6.0–8.3)

## 2011-12-01 NOTE — Progress Notes (Signed)
This office note has been dictated.

## 2011-12-01 NOTE — Telephone Encounter (Signed)
appt made and printed for 4/2,tx on 4/3 was already sch    aom

## 2011-12-01 NOTE — Progress Notes (Signed)
CC:   [name], M.D. [name], M.D. Edsel Petrin. [name], M.D.  IDENTIFYING PATIENT:  The patient is a 71 year old man with CLL, who presents for followup prior to chemotherapy.  INTERVAL HISTORY:  Mr. Klimczak has no current concerns or issues.  He has no nausea, vomiting, abdominal pain.  His weight is stable.  All in all, he is tolerating therapy with very minimal difficulties.  MEDICATIONS:  Reviewed and updated.  He continues on Bactrim and Valtrex.  ALLERGIES:  None.  Past medical history, family history and social history unchanged.  REVIEW OF SYSTEMS:  10-point review of systems negative.  PHYSICAL EXAMINATION:  The patient is a well-appearing, well-nourished man in no distress.  Vitals:  Pulse 100, blood pressure 122/65, temperature 97.3, respirations 20, weight 142 pounds.  HEENT:  Head is atraumatic, normocephalic.  Sclerae anicteric.  Mouth moist.  Chest: Clear.  Abdomen:  Soft and nontender.  Bowel sounds present. Extremities:  No edema.  No calf tenderness.  CNS:  Nonfocal.  Lymph nodes:  No adenopathy.  LABORATORY DATA:  Pending.  IMPRESSION/PLAN:  Mr. Thornberry is a 71 year old man with chronic CLL/small lymphocytic lymphoma with a history of progressive adenopathy and thrombocytopenia with splenomegaly.  The tumor was negative for deletion 17p, 11q.  He is status post 4 cycles of FCR beginning August 04, 2011.  He is thus far doing well.  He will proceed for blood work and if his [place] is adequate, he will receive cycle 5 in the morning with no dose adjustments.  He continue son bactrim and valtrex.  He follows up in a month's time.  I spent more than half the time coordinating care.    ______________________________ Nira Retort, M.D. LIO/MEDQ  D:  12/01/2011  T:  12/01/2011  Job:  [id number]

## 2011-12-01 NOTE — Patient Instructions (Signed)
Patient to follow up as instructed.  

## 2011-12-02 ENCOUNTER — Ambulatory Visit (HOSPITAL_BASED_OUTPATIENT_CLINIC_OR_DEPARTMENT_OTHER): Payer: Medicare Other

## 2011-12-02 VITALS — BP 118/72 | HR 87 | Temp 97.2°F

## 2011-12-02 DIAGNOSIS — Z5111 Encounter for antineoplastic chemotherapy: Secondary | ICD-10-CM | POA: Diagnosis not present

## 2011-12-02 DIAGNOSIS — C9110 Chronic lymphocytic leukemia of B-cell type not having achieved remission: Secondary | ICD-10-CM

## 2011-12-02 DIAGNOSIS — Z5112 Encounter for antineoplastic immunotherapy: Secondary | ICD-10-CM | POA: Diagnosis not present

## 2011-12-02 MED ORDER — SODIUM CHLORIDE 0.9 % IV SOLN
250.0000 mg/m2 | Freq: Once | INTRAVENOUS | Status: AC
  Administered 2011-12-02: 440 mg via INTRAVENOUS
  Filled 2011-12-02: qty 22

## 2011-12-02 MED ORDER — SODIUM CHLORIDE 0.9 % IV SOLN
700.0000 mg | Freq: Once | INTRAVENOUS | Status: AC
  Administered 2011-12-02: 700 mg via INTRAVENOUS
  Filled 2011-12-02: qty 70

## 2011-12-02 MED ORDER — ONDANSETRON 8 MG/50ML IVPB (CHCC)
8.0000 mg | Freq: Once | INTRAVENOUS | Status: AC
  Administered 2011-12-02: 8 mg via INTRAVENOUS

## 2011-12-02 MED ORDER — SODIUM CHLORIDE 0.9 % IV SOLN
Freq: Once | INTRAVENOUS | Status: AC
  Administered 2011-12-02: 09:00:00 via INTRAVENOUS

## 2011-12-02 MED ORDER — SODIUM CHLORIDE 0.9 % IV SOLN
25.0000 mg/m2 | Freq: Once | INTRAVENOUS | Status: AC
  Administered 2011-12-02: 45 mg via INTRAVENOUS
  Filled 2011-12-02: qty 1.8

## 2011-12-02 MED ORDER — ACETAMINOPHEN 325 MG PO TABS
650.0000 mg | ORAL_TABLET | Freq: Once | ORAL | Status: AC
  Administered 2011-12-02: 650 mg via ORAL

## 2011-12-02 MED ORDER — DIPHENHYDRAMINE HCL 25 MG PO CAPS
50.0000 mg | ORAL_CAPSULE | Freq: Once | ORAL | Status: AC
  Administered 2011-12-02: 50 mg via ORAL

## 2011-12-02 MED ORDER — SODIUM CHLORIDE 0.9 % IV SOLN: 375.0000 mg/m2 | Freq: Once | INTRAVENOUS | Status: DC

## 2011-12-03 ENCOUNTER — Ambulatory Visit (HOSPITAL_BASED_OUTPATIENT_CLINIC_OR_DEPARTMENT_OTHER): Payer: Medicare Other

## 2011-12-03 VITALS — BP 115/58 | HR 87 | Temp 98.1°F

## 2011-12-03 DIAGNOSIS — C9110 Chronic lymphocytic leukemia of B-cell type not having achieved remission: Secondary | ICD-10-CM

## 2011-12-03 DIAGNOSIS — Z5111 Encounter for antineoplastic chemotherapy: Secondary | ICD-10-CM

## 2011-12-03 MED ORDER — SODIUM CHLORIDE 0.9 % IV SOLN
Freq: Once | INTRAVENOUS | Status: AC
  Administered 2011-12-03: 10:00:00 via INTRAVENOUS

## 2011-12-03 MED ORDER — SODIUM CHLORIDE 0.9 % IV SOLN
25.0000 mg/m2 | Freq: Once | INTRAVENOUS | Status: AC
  Administered 2011-12-03: 45 mg via INTRAVENOUS
  Filled 2011-12-03: qty 1.8

## 2011-12-03 MED ORDER — CYCLOPHOSPHAMIDE CHEMO INJECTION 1 GM
250.0000 mg/m2 | Freq: Once | INTRAMUSCULAR | Status: AC
  Administered 2011-12-03: 440 mg via INTRAVENOUS
  Filled 2011-12-03: qty 22

## 2011-12-03 MED ORDER — ONDANSETRON 8 MG/50ML IVPB (CHCC)
8.0000 mg | Freq: Once | INTRAVENOUS | Status: AC
  Administered 2011-12-03: 8 mg via INTRAVENOUS

## 2011-12-03 NOTE — Patient Instructions (Signed)
[facility] Discharge Instructions for Patients Receiving Chemotherapy  Today you received the following chemotherapy agents Fludara and Cytoxan  To help prevent nausea and vomiting after your treatment, we encourage you to take your nausea medication as directed by MD   If you develop nausea and vomiting that is not controlled by your nausea medication, call the clinic. If it is after clinic hours your family physician or the after hours number for the clinic or go to the Emergency Department.   BELOW ARE SYMPTOMS THAT SHOULD BE REPORTED IMMEDIATELY:  *FEVER GREATER THAN 100.5 F  *CHILLS WITH OR WITHOUT FEVER  NAUSEA AND VOMITING THAT IS NOT CONTROLLED WITH YOUR NAUSEA MEDICATION  *UNUSUAL SHORTNESS OF BREATH  *UNUSUAL BRUISING OR BLEEDING  TENDERNESS IN MOUTH AND THROAT WITH OR WITHOUT PRESENCE OF ULCERS  *URINARY PROBLEMS  *BOWEL PROBLEMS  UNUSUAL RASH Items with * indicate a potential emergency and should be followed up as soon as possible.  One of the nurses will contact you 24 hours after your first treatment. Please let the nurse know about any problems that you may have experienced. Feel free to call the clinic you have any questions or concerns. The clinic phone number is [phone].   I have been informed and understand all the instructions given to me. I know to contact the clinic, my physician, or go to the Emergency Department if any problems should occur. I do not have any questions at this time, but understand that I may call the clinic during office hours   should I have any questions or need assistance in obtaining follow up care.    __________________________________________  _____________  __________ Signature of Patient or Authorized Representative            Date                   Time    __________________________________________ Nurse's Signature

## 2011-12-04 ENCOUNTER — Ambulatory Visit (HOSPITAL_BASED_OUTPATIENT_CLINIC_OR_DEPARTMENT_OTHER): Payer: Medicare Other

## 2011-12-04 VITALS — BP 154/61 | HR 91 | Temp 97.3°F

## 2011-12-04 DIAGNOSIS — Z5111 Encounter for antineoplastic chemotherapy: Secondary | ICD-10-CM | POA: Diagnosis not present

## 2011-12-04 DIAGNOSIS — C9110 Chronic lymphocytic leukemia of B-cell type not having achieved remission: Secondary | ICD-10-CM | POA: Diagnosis not present

## 2011-12-04 MED ORDER — SODIUM CHLORIDE 0.9 % IV SOLN
25.0000 mg/m2 | Freq: Once | INTRAVENOUS | Status: AC
  Administered 2011-12-04: 45 mg via INTRAVENOUS
  Filled 2011-12-04: qty 1.8

## 2011-12-04 MED ORDER — ONDANSETRON 8 MG/50ML IVPB (CHCC)
8.0000 mg | Freq: Once | INTRAVENOUS | Status: AC
  Administered 2011-12-04: 8 mg via INTRAVENOUS

## 2011-12-04 MED ORDER — SODIUM CHLORIDE 0.9 % IV SOLN
Freq: Once | INTRAVENOUS | Status: AC
  Administered 2011-12-04: 10:00:00 via INTRAVENOUS

## 2011-12-04 MED ORDER — SODIUM CHLORIDE 0.9 % IV SOLN
250.0000 mg/m2 | Freq: Once | INTRAVENOUS | Status: AC
  Administered 2011-12-04: 440 mg via INTRAVENOUS
  Filled 2011-12-04: qty 22

## 2011-12-04 NOTE — Patient Instructions (Signed)
[facility] Discharge Instructions for Patients Receiving Chemotherapy  Today you received the following chemotherapy agents Fludara and cytoxan  To help prevent nausea and vomiting after your treatment, we encourage you to take your nausea medication as directed by MD   If you develop nausea and vomiting that is not controlled by your nausea medication, call the clinic. If it is after clinic hours your family physician or the after hours number for the clinic or go to the Emergency Department.   BELOW ARE SYMPTOMS THAT SHOULD BE REPORTED IMMEDIATELY:  *FEVER GREATER THAN 100.5 F  *CHILLS WITH OR WITHOUT FEVER  NAUSEA AND VOMITING THAT IS NOT CONTROLLED WITH YOUR NAUSEA MEDICATION  *UNUSUAL SHORTNESS OF BREATH  *UNUSUAL BRUISING OR BLEEDING  TENDERNESS IN MOUTH AND THROAT WITH OR WITHOUT PRESENCE OF ULCERS  *URINARY PROBLEMS  *BOWEL PROBLEMS  UNUSUAL RASH Items with * indicate a potential emergency and should be followed up as soon as possible.  One of the nurses will contact you 24 hours after your first treatment. Please let the nurse know about any problems that you may have experienced. Feel free to call the clinic you have any questions or concerns. The clinic phone number is [phone].   I have been informed and understand all the instructions given to me. I know to contact the clinic, my physician, or go to the Emergency Department if any problems should occur. I do not have any questions at this time, but understand that I may call the clinic during office hours   should I have any questions or need assistance in obtaining follow up care.    __________________________________________  _____________  __________ Signature of Patient or Authorized Representative            Date                   Time    __________________________________________ Nurse's Signature

## 2011-12-05 ENCOUNTER — Ambulatory Visit (HOSPITAL_BASED_OUTPATIENT_CLINIC_OR_DEPARTMENT_OTHER): Payer: Medicare Other

## 2011-12-05 VITALS — BP 113/70 | HR 93 | Temp 97.5°F

## 2011-12-05 DIAGNOSIS — C9110 Chronic lymphocytic leukemia of B-cell type not having achieved remission: Secondary | ICD-10-CM | POA: Diagnosis not present

## 2011-12-05 MED ORDER — PEGFILGRASTIM INJECTION 6 MG/0.6ML
6.0000 mg | Freq: Once | SUBCUTANEOUS | Status: AC
  Administered 2011-12-05: 6 mg via SUBCUTANEOUS

## 2011-12-25 ENCOUNTER — Other Ambulatory Visit: Payer: Self-pay | Admitting: Hematology and Oncology

## 2011-12-29 ENCOUNTER — Ambulatory Visit (HOSPITAL_BASED_OUTPATIENT_CLINIC_OR_DEPARTMENT_OTHER): Payer: Medicare Other | Admitting: Hematology and Oncology

## 2011-12-29 ENCOUNTER — Telehealth: Payer: Self-pay | Admitting: Hematology and Oncology

## 2011-12-29 ENCOUNTER — Encounter: Payer: Self-pay | Admitting: Hematology and Oncology

## 2011-12-29 ENCOUNTER — Other Ambulatory Visit (HOSPITAL_BASED_OUTPATIENT_CLINIC_OR_DEPARTMENT_OTHER): Payer: Medicare Other | Admitting: Lab

## 2011-12-29 VITALS — BP 116/59 | HR 84 | Temp 96.5°F | Ht 67.5 in | Wt 142.0 lb

## 2011-12-29 DIAGNOSIS — Z85038 Personal history of other malignant neoplasm of large intestine: Secondary | ICD-10-CM | POA: Diagnosis not present

## 2011-12-29 DIAGNOSIS — C9110 Chronic lymphocytic leukemia of B-cell type not having achieved remission: Secondary | ICD-10-CM | POA: Diagnosis not present

## 2011-12-29 DIAGNOSIS — C189 Malignant neoplasm of colon, unspecified: Secondary | ICD-10-CM

## 2011-12-29 LAB — COMPREHENSIVE METABOLIC PANEL
ALT: 14 U/L (ref 0–53)
AST: 15 U/L (ref 0–37)
Albumin: 4.4 g/dL (ref 3.5–5.2)
Alkaline Phosphatase: 105 U/L (ref 39–117)
BUN: 18 mg/dL (ref 6–23)
CO2: 28 mEq/L (ref 19–32)
Calcium: 9.8 mg/dL (ref 8.4–10.5)
Chloride: 105 mEq/L (ref 96–112)
Creatinine, Ser: 1.08 mg/dL (ref 0.50–1.35)
Glucose, Bld: 163 mg/dL — ABNORMAL HIGH (ref 70–99)
Potassium: 4.5 mEq/L (ref 3.5–5.3)
Sodium: 141 mEq/L (ref 135–145)
Total Bilirubin: 0.8 mg/dL (ref 0.3–1.2)
Total Protein: 6.9 g/dL (ref 6.0–8.3)

## 2011-12-29 LAB — CBC WITH DIFFERENTIAL/PLATELET
BASO%: 2 % (ref 0.0–2.0)
Basophils Absolute: 0.1 10*3/uL (ref 0.0–0.1)
EOS%: 14 % — ABNORMAL HIGH (ref 0.0–7.0)
Eosinophils Absolute: 0.4 10*3/uL (ref 0.0–0.5)
HCT: 35.9 % — ABNORMAL LOW (ref 38.4–49.9)
HGB: 12.2 g/dL — ABNORMAL LOW (ref 13.0–17.1)
LYMPH%: 19.5 % (ref 14.0–49.0)
MCH: 32.1 pg (ref 27.2–33.4)
MCHC: 34.1 g/dL (ref 32.0–36.0)
MCV: 94.1 fL (ref 79.3–98.0)
MONO#: 0.5 10*3/uL (ref 0.1–0.9)
MONO%: 15.9 % — ABNORMAL HIGH (ref 0.0–14.0)
NEUT#: 1.6 10*3/uL (ref 1.5–6.5)
NEUT%: 48.6 % (ref 39.0–75.0)
Platelets: 133 10*3/uL — ABNORMAL LOW (ref 140–400)
RBC: 3.81 10*6/uL — ABNORMAL LOW (ref 4.20–5.82)
RDW: 15.5 % — ABNORMAL HIGH (ref 11.0–14.6)
WBC: 3.2 10*3/uL — ABNORMAL LOW (ref 4.0–10.3)
lymph#: 0.6 10*3/uL — ABNORMAL LOW (ref 0.9–3.3)
nRBC: 0 % (ref 0–0)

## 2011-12-29 NOTE — Progress Notes (Signed)
CC:   Edsel Petrin. [name], M.D. [name], M.D. [name], M.D.  IDENTIFYING STATEMENT:  The patient is a 71 year old man with CLL who presents for followup.  INTERVAL HISTORY:  Ms. Hornick has no current concerns.  Denies pain. He is afebrile.  Denies adenopathy.  MEDICATIONS:  Reviewed and updated.  Continues with Bactrim and Valtrex.  ALLERGIES:  None.  Past medical history, family history and social history unchanged.  REVIEW OF SYSTEMS:  Ten point review of systems negative.  PHYSICAL EXAMINATION:  General:  The patient is a well-appearing, well- nourished man in no distress.  Vitals:  Pulse 84, blood pressure 116/69, temperature 96.5, respirations 20, weight 142 pounds.  HEENT:  Head is atraumatic, normocephalic.  Sclerae anicteric.  Mouth moist.  Chest: Clear.  CVS:  Unremarkable.  Abdomen:  Soft, nontender.  Bowel sounds present.  Extremities:  No calf tenderness, edema.  CNS:  Nonfocal. Lymph nodes:  No adenopathy.  LAB DATA:  On 12/29/2011 white cell count 3.2, hemoglobin 12.3, hematocrit 35.9, platelets 133, ANC 1600.  CMET pending.  IMPRESSION AND PLAN: 1. Mr. Hashim is a 71 year old man with chronic lymphocytic     leukemia/small lymphocytic lymphoma with history of progressive     adenopathy and thrombocytopenia with splenomegaly.  Tumor was     negative for deletion 17p and 11q.  He is status post 5 cycles of     FCR beginning on 08/04/2011.  His labs are adequate, and he has no     toxicity thus will proceed for his sixth and final cycle in the     morning.  He follows up in 6 weeks time with CT scan and blood     work.  He continues with Bactrim and Valtrex. 2. History of colon cancer.    ______________________________ Nira Retort, M.D. LIO/MEDQ  D:  12/29/2011  T:  12/29/2011  Job:  [id number]

## 2011-12-29 NOTE — Progress Notes (Signed)
This office note has been dictated.

## 2011-12-29 NOTE — Patient Instructions (Signed)
Patient to follow up as instructed.   Current Outpatient Prescriptions  Medication Sig Dispense Refill  . allopurinol (ZYLOPRIM) 300 MG tablet Take 300 mg by mouth daily.       . ondansetron (ZOFRAN) 8 MG tablet Take 8 mg by mouth every 12 (twelve) hours as needed. Take 1 tab po  BID x 3 days - begin day after last dose chemo and  PRN  Nausea.       . simvastatin (ZOCOR) 80 MG tablet Take 80 mg by mouth at bedtime.        . sulfamethoxazole-trimethoprim (BACTRIM DS) 800-160 MG per tablet Take 1 tablet by mouth every Monday, Wednesday, and Friday. While on Cytoxan + Fludara + Rituxan      . valACYclovir (VALTREX) 500 MG tablet Take 250 mg by mouth Daily. While on Cytoxan + Fludara + Rituxan            April 2013  Sunday Monday Tuesday Wednesday Thursday Friday Saturday     1   2   LAB MO     2:30 PM  (15 min.)  Vicki P Mitchell  [facility]   EST PT 30   3:00 PM  (30 min.)  Hoang Pettingill I Dariel Pellecchia, MD  [facility] 3   INFUSION 6 HR   9:30 AM  (360 min.)  [facility]  [facility] 4   INFUSION 2 HR  11:45 AM  (120 min.)  [facility] H31  [facility] 5   INFUSION 2 HR  11:00 AM  (120 min.)  [facility] H30  [facility] 6   INJECTION  10:00 AM  (15 min.)  [facility] Inj Nurse  [facility]     Cycle 6, Day 1 Cycle 6, Day 2 Cycle 6, Day 3 Cycle 6, Day 4  7   8   9   10   11   12   13            14   15   16   17   18   19   20            21   22   23   24   25   26   27            28    29   30                          Treatment Details  12/30/2011 - Cycle 6, Day 1     Other: SCHEDULING COMMUNICATION     Nursing Communication: TREATMENT CONDITIONS     Hydration: sodium chloride     Nursing Orders: sodium chloride,  heparin lock flush, heparin lock flush, alteplase (CATHFLO ACTIVASE), sodium chloride     Pre-Medications: ONDANSETRON IVPB ORDERABLE ([facility]), acetaminophen (TYLENOL), diphenhydrAMINE (BENADRYL)     Chemotherapy: rituximab (RITUXAN), FLUDARABINE CHEMO IV INFUSION, cyclophosphamide (CYTOXAN)     Emergency Medications: diphenhydrAMINE (BENADRYL), sodium chloride, methylPREDNISolone sodium succinate (SOLU-MEDROL), EPINEPHrine (ADRENALIN), EPINEPHrine (ADRENALIN), diphenhydrAMINE (BENADRYL), albuterol (PROVENTIL)     Nursing Orders: Cold Pack  12/31/2011 - Cycle 6, Day 2     Other: SCHEDULING COMMUNICATION     Hydration: sodium chloride     Nursing Orders: sodium chloride, heparin lock flush, heparin lock flush, alteplase (CATHFLO  ACTIVASE), sodium chloride     Pre-Medications: ONDANSETRON IVPB ORDERABLE ([facility])     Chemotherapy: FLUDARABINE CHEMO IV INFUSION, cyclophosphamide (CYTOXAN)     Nursing Orders: Cold Pack  01/01/2012 - Cycle 6, Day 3     Other: SCHEDULING COMMUNICATION     Hydration: sodium chloride     Nursing Orders: sodium chloride, heparin lock flush, heparin lock flush, alteplase (CATHFLO ACTIVASE), sodium chloride     Pre-Medications: ONDANSETRON IVPB ORDERABLE ([facility])     Chemotherapy: FLUDARABINE CHEMO IV INFUSION, cyclophosphamide (CYTOXAN)     Nursing Orders: Cold Pack  01/02/2012 - Cycle 6, Day 4     Supportive Care: pegfilgrastim (NEULASTA)

## 2011-12-29 NOTE — Telephone Encounter (Signed)
appts made and printed for pt aom °

## 2011-12-30 ENCOUNTER — Ambulatory Visit (HOSPITAL_BASED_OUTPATIENT_CLINIC_OR_DEPARTMENT_OTHER): Payer: Medicare Other

## 2011-12-30 VITALS — BP 121/67 | HR 93 | Temp 97.1°F

## 2011-12-30 DIAGNOSIS — Z5111 Encounter for antineoplastic chemotherapy: Secondary | ICD-10-CM | POA: Diagnosis not present

## 2011-12-30 DIAGNOSIS — C9110 Chronic lymphocytic leukemia of B-cell type not having achieved remission: Secondary | ICD-10-CM

## 2011-12-30 MED ORDER — SODIUM CHLORIDE 0.9 % IV SOLN: Freq: Once | INTRAVENOUS | Status: DC

## 2011-12-30 MED ORDER — SODIUM CHLORIDE 0.9 % IV SOLN
250.0000 mg/m2 | Freq: Once | INTRAVENOUS | Status: AC
  Administered 2011-12-30: 440 mg via INTRAVENOUS
  Filled 2011-12-30: qty 22

## 2011-12-30 MED ORDER — ACETAMINOPHEN 325 MG PO TABS
650.0000 mg | ORAL_TABLET | Freq: Once | ORAL | Status: AC
  Administered 2011-12-30: 650 mg via ORAL

## 2011-12-30 MED ORDER — SODIUM CHLORIDE 0.9 % IV SOLN: 375.0000 mg/m2 | Freq: Once | INTRAVENOUS | Status: DC

## 2011-12-30 MED ORDER — SODIUM CHLORIDE 0.9 % IV SOLN
25.0000 mg/m2 | Freq: Once | INTRAVENOUS | Status: AC
  Administered 2011-12-30: 45 mg via INTRAVENOUS
  Filled 2011-12-30: qty 1.8

## 2011-12-30 MED ORDER — SODIUM CHLORIDE 0.9 % IV SOLN
375.0000 mg/m2 | Freq: Once | INTRAVENOUS | Status: AC
  Administered 2011-12-30: 700 mg via INTRAVENOUS
  Filled 2011-12-30: qty 70

## 2011-12-30 MED ORDER — DIPHENHYDRAMINE HCL 25 MG PO CAPS
50.0000 mg | ORAL_CAPSULE | Freq: Once | ORAL | Status: AC
  Administered 2011-12-30: 50 mg via ORAL

## 2011-12-30 MED ORDER — ONDANSETRON 8 MG/50ML IVPB (CHCC)
8.0000 mg | Freq: Once | INTRAVENOUS | Status: AC
  Administered 2011-12-30: 8 mg via INTRAVENOUS

## 2011-12-30 NOTE — Patient Instructions (Signed)
[facility] Discharge Instructions for Patients Receiving Chemotherapy  Today you received the following chemotherapy agents: Rituxan, Fludara, and Cytoxan.  To help prevent nausea and vomiting after your treatment, we encourage you to take your nausea medication as prescribed by your physician.   If you develop nausea and vomiting that is not controlled by your nausea medication, call the clinic.   BELOW ARE SYMPTOMS THAT SHOULD BE REPORTED IMMEDIATELY:  *FEVER GREATER THAN 100.5 F  *CHILLS WITH OR WITHOUT FEVER  NAUSEA AND VOMITING THAT IS NOT CONTROLLED WITH YOUR NAUSEA MEDICATION  *UNUSUAL SHORTNESS OF BREATH  *UNUSUAL BRUISING OR BLEEDING  TENDERNESS IN MOUTH AND THROAT WITH OR WITHOUT PRESENCE OF ULCERS  *URINARY PROBLEMS  *BOWEL PROBLEMS  UNUSUAL RASH Items with * indicate a potential emergency and should be followed up as soon as possible.  Feel free to call the clinic you have any questions or concerns. The clinic phone number is [phone].

## 2011-12-31 ENCOUNTER — Ambulatory Visit (HOSPITAL_BASED_OUTPATIENT_CLINIC_OR_DEPARTMENT_OTHER): Payer: Medicare Other

## 2011-12-31 VITALS — BP 118/59 | HR 78 | Temp 98.1°F

## 2011-12-31 DIAGNOSIS — Z5111 Encounter for antineoplastic chemotherapy: Secondary | ICD-10-CM | POA: Diagnosis not present

## 2011-12-31 DIAGNOSIS — C9110 Chronic lymphocytic leukemia of B-cell type not having achieved remission: Secondary | ICD-10-CM | POA: Diagnosis not present

## 2011-12-31 MED ORDER — SODIUM CHLORIDE 0.9 % IV SOLN
250.0000 mg/m2 | Freq: Once | INTRAVENOUS | Status: AC
  Administered 2011-12-31: 440 mg via INTRAVENOUS
  Filled 2011-12-31: qty 22

## 2011-12-31 MED ORDER — FLUDARABINE PHOSPHATE CHEMO INJECTION 50 MG
25.0000 mg/m2 | Freq: Once | INTRAVENOUS | Status: AC
  Administered 2011-12-31: 45 mg via INTRAVENOUS
  Filled 2011-12-31: qty 1.8

## 2011-12-31 MED ORDER — SODIUM CHLORIDE 0.9 % IV SOLN
Freq: Once | INTRAVENOUS | Status: AC
  Administered 2011-12-31: 12:00:00 via INTRAVENOUS

## 2011-12-31 MED ORDER — ONDANSETRON 8 MG/50ML IVPB (CHCC)
8.0000 mg | Freq: Once | INTRAVENOUS | Status: AC
  Administered 2011-12-31: 8 mg via INTRAVENOUS

## 2011-12-31 NOTE — Progress Notes (Signed)
Patient tolerated treatment well with no complaints.

## 2012-01-01 ENCOUNTER — Ambulatory Visit (HOSPITAL_BASED_OUTPATIENT_CLINIC_OR_DEPARTMENT_OTHER): Payer: Medicare Other

## 2012-01-01 VITALS — BP 135/72 | HR 72 | Temp 97.6°F

## 2012-01-01 DIAGNOSIS — C9110 Chronic lymphocytic leukemia of B-cell type not having achieved remission: Secondary | ICD-10-CM | POA: Diagnosis not present

## 2012-01-01 DIAGNOSIS — Z5111 Encounter for antineoplastic chemotherapy: Secondary | ICD-10-CM

## 2012-01-01 MED ORDER — ONDANSETRON 8 MG/50ML IVPB (CHCC)
8.0000 mg | Freq: Once | INTRAVENOUS | Status: DC
Start: 2012-01-01 — End: 2012-01-01

## 2012-01-01 MED ORDER — SODIUM CHLORIDE 0.9 % IV SOLN
250.0000 mg/m2 | Freq: Once | INTRAVENOUS | Status: AC
  Administered 2012-01-01: 440 mg via INTRAVENOUS
  Filled 2012-01-01: qty 22

## 2012-01-01 MED ORDER — SODIUM CHLORIDE 0.9 % IV SOLN
Freq: Once | INTRAVENOUS | Status: AC
  Administered 2012-01-01: 11:00:00 via INTRAVENOUS

## 2012-01-01 MED ORDER — SODIUM CHLORIDE 0.9 % IJ SOLN
10.0000 mL | INTRAMUSCULAR | Status: DC | PRN
  Filled 2012-01-01: qty 10

## 2012-01-01 MED ORDER — SODIUM CHLORIDE 0.9 % IV SOLN
25.0000 mg/m2 | Freq: Once | INTRAVENOUS | Status: AC
  Administered 2012-01-01: 45 mg via INTRAVENOUS
  Filled 2012-01-01: qty 1.8

## 2012-01-01 MED ORDER — HEPARIN SOD (PORK) LOCK FLUSH 100 UNIT/ML IV SOLN
500.0000 [IU] | Freq: Once | INTRAVENOUS | Status: DC | PRN
  Filled 2012-01-01: qty 5

## 2012-01-01 NOTE — Patient Instructions (Signed)
[facility] Discharge Instructions for Patients Receiving Chemotherapy  Today you received the following chemotherapy agents fludura, cytoxan To help prevent nausea and vomiting after your treatment, we encourage you to take your nausea medication * and take it as often as prescribed for the nextIf you develop nausea and vomiting that is not controlled by your nausea medication, call the clinic. If it is after clinic hours your family physician or the after hours number for the clinic or go to the Emergency Department.   BELOW ARE SYMPTOMS THAT SHOULD BE REPORTED IMMEDIATELY:  *FEVER GREATER THAN 100.5 F  *CHILLS WITH OR WITHOUT FEVER  NAUSEA AND VOMITING THAT IS NOT CONTROLLED WITH YOUR NAUSEA MEDICATION  *UNUSUAL SHORTNESS OF BREATH  *UNUSUAL BRUISING OR BLEEDING  TENDERNESS IN MOUTH AND THROAT WITH OR WITHOUT PRESENCE OF ULCERS  *URINARY PROBLEMS  *BOWEL PROBLEMS  UNUSUAL RASH Items with * indicate a potential emergency and should be followed up as soon as possible.  One of the nurses will contact you 24 hours after your treatment. Please let the nurse know about any problems that you may have experienced. Feel free to call the clinic you have any questions or concerns. The clinic phone number is [phone].   I have been informed and understand all the instructions given to me. I know to contact the clinic, my physician, or go to the Emergency Department if any problems should occur. I do not have any questions at this time, but understand that I may call the clinic during office hours   should I have any questions or need assistance in obtaining follow up care.    __________________________________________  _____________  __________ Signature of Patient or Authorized Representative            Date                   Time    __________________________________________ Nurse's Signature

## 2012-01-02 ENCOUNTER — Ambulatory Visit (HOSPITAL_BASED_OUTPATIENT_CLINIC_OR_DEPARTMENT_OTHER): Payer: Medicare Other

## 2012-01-02 VITALS — BP 111/61 | HR 84 | Temp 97.0°F

## 2012-01-02 DIAGNOSIS — C9110 Chronic lymphocytic leukemia of B-cell type not having achieved remission: Secondary | ICD-10-CM | POA: Diagnosis not present

## 2012-01-02 MED ORDER — PEGFILGRASTIM INJECTION 6 MG/0.6ML
6.0000 mg | Freq: Once | SUBCUTANEOUS | Status: AC
  Administered 2012-01-02: 6 mg via SUBCUTANEOUS

## 2012-02-15 ENCOUNTER — Ambulatory Visit (HOSPITAL_COMMUNITY)
Admission: RE | Admit: 2012-02-15 | Discharge: 2012-02-15 | Disposition: A | Discharge status: Home / Self Care | Payer: Medicare Other | Source: Ambulatory Visit | Attending: Hematology and Oncology | Admitting: Hematology and Oncology

## 2012-02-15 ENCOUNTER — Other Ambulatory Visit (HOSPITAL_BASED_OUTPATIENT_CLINIC_OR_DEPARTMENT_OTHER): Payer: Medicare Other | Admitting: Lab

## 2012-02-15 DIAGNOSIS — R599 Enlarged lymph nodes, unspecified: Secondary | ICD-10-CM | POA: Diagnosis not present

## 2012-02-15 DIAGNOSIS — C8589 Other specified types of non-Hodgkin lymphoma, extranodal and solid organ sites: Secondary | ICD-10-CM | POA: Diagnosis not present

## 2012-02-15 DIAGNOSIS — C9110 Chronic lymphocytic leukemia of B-cell type not having achieved remission: Secondary | ICD-10-CM | POA: Insufficient documentation

## 2012-02-15 DIAGNOSIS — N281 Cyst of kidney, acquired: Secondary | ICD-10-CM | POA: Insufficient documentation

## 2012-02-15 DIAGNOSIS — N4289 Other specified disorders of prostate: Secondary | ICD-10-CM | POA: Insufficient documentation

## 2012-02-15 DIAGNOSIS — C189 Malignant neoplasm of colon, unspecified: Secondary | ICD-10-CM | POA: Diagnosis not present

## 2012-02-15 DIAGNOSIS — K7689 Other specified diseases of liver: Secondary | ICD-10-CM | POA: Insufficient documentation

## 2012-02-15 DIAGNOSIS — Z87898 Personal history of other specified conditions: Secondary | ICD-10-CM | POA: Diagnosis not present

## 2012-02-15 DIAGNOSIS — C9590 Leukemia, unspecified not having achieved remission: Secondary | ICD-10-CM | POA: Diagnosis not present

## 2012-02-15 LAB — CMP (CANCER CENTER ONLY)
ALT(SGPT): 19 U/L (ref 10–47)
AST: 20 U/L (ref 11–38)
Albumin: 3.8 g/dL (ref 3.3–5.5)
Alkaline Phosphatase: 99 U/L — ABNORMAL HIGH (ref 26–84)
BUN, Bld: 14 mg/dL (ref 7–22)
CO2: 28 mEq/L (ref 18–33)
Calcium: 9.2 mg/dL (ref 8.0–10.3)
Chloride: 99 mEq/L (ref 98–108)
Creat: 1 mg/dl (ref 0.6–1.2)
Glucose, Bld: 102 mg/dL (ref 73–118)
Potassium: 4.4 mEq/L (ref 3.3–4.7)
Sodium: 143 mEq/L (ref 128–145)
Total Bilirubin: 1.3 mg/dl (ref 0.20–1.60)
Total Protein: 7.6 g/dL (ref 6.4–8.1)

## 2012-02-15 LAB — CBC WITH DIFFERENTIAL/PLATELET
BASO%: 1 % (ref 0.0–2.0)
Basophils Absolute: 0 10*3/uL (ref 0.0–0.1)
EOS%: 17.2 % — ABNORMAL HIGH (ref 0.0–7.0)
Eosinophils Absolute: 0.5 10*3/uL (ref 0.0–0.5)
HCT: 37.9 % — ABNORMAL LOW (ref 38.4–49.9)
HGB: 12.9 g/dL — ABNORMAL LOW (ref 13.0–17.1)
LYMPH%: 21 % (ref 14.0–49.0)
MCH: 31.6 pg (ref 27.2–33.4)
MCHC: 34.1 g/dL (ref 32.0–36.0)
MCV: 92.8 fL (ref 79.3–98.0)
MONO#: 0.5 10*3/uL (ref 0.1–0.9)
MONO%: 17.5 % — ABNORMAL HIGH (ref 0.0–14.0)
NEUT#: 1.3 10*3/uL — ABNORMAL LOW (ref 1.5–6.5)
NEUT%: 43.3 % (ref 39.0–75.0)
Platelets: 132 10*3/uL — ABNORMAL LOW (ref 140–400)
RBC: 4.08 10*6/uL — ABNORMAL LOW (ref 4.20–5.82)
RDW: 14.1 % (ref 11.0–14.6)
WBC: 3 10*3/uL — ABNORMAL LOW (ref 4.0–10.3)
lymph#: 0.6 10*3/uL — ABNORMAL LOW (ref 0.9–3.3)
nRBC: 0 % (ref 0–0)

## 2012-02-15 LAB — LACTATE DEHYDROGENASE: LDH: 115 U/L (ref 94–250)

## 2012-02-15 LAB — CEA: CEA: <0.5 ng/mL (ref 0.0–5.0)

## 2012-02-15 MED ORDER — IOHEXOL 300 MG/ML  SOLN
100.0000 mL | Freq: Once | INTRAMUSCULAR | Status: AC | PRN
  Administered 2012-02-15: 100 mL via INTRAVENOUS

## 2012-02-17 ENCOUNTER — Ambulatory Visit (HOSPITAL_BASED_OUTPATIENT_CLINIC_OR_DEPARTMENT_OTHER): Payer: Medicare Other | Admitting: Hematology and Oncology

## 2012-02-17 ENCOUNTER — Encounter: Payer: Self-pay | Admitting: Hematology and Oncology

## 2012-02-17 ENCOUNTER — Telehealth: Payer: Self-pay | Admitting: Hematology and Oncology

## 2012-02-17 VITALS — BP 120/60 | HR 105 | Temp 97.8°F | Ht 67.5 in | Wt 141.8 lb

## 2012-02-17 DIAGNOSIS — C189 Malignant neoplasm of colon, unspecified: Secondary | ICD-10-CM

## 2012-02-17 DIAGNOSIS — C9110 Chronic lymphocytic leukemia of B-cell type not having achieved remission: Secondary | ICD-10-CM

## 2012-02-17 NOTE — Telephone Encounter (Signed)
Gave pt appt for September 2013 lab before MD

## 2012-02-17 NOTE — Progress Notes (Signed)
CC:   [name], M.D. Edsel Petrin. [name], M.D. [name], M.D.  IDENTIFYING STATEMENT:  The patient is a 71 year old man with CLL and colon cancer who presents for followup.  INTERVAL HISTORY:  Mr. Burghart has completed 6 cycles of FCR for CLL. In general, he tolerated therapy with very minimal complaints or toxicity.  He currently feels well.  He has no fever chills, night sweats.  His weight continues to remain stable.  He denies pain.  A recent CT scan of the chest, abdomen and pelvis on 02/15/2012 reveals complete response to therapy with no adenopathy in the neck, chest, abdomen, and pelvis.  Bilateral renal cysts were noted.  In addition, there were also cysts seen in the liver and bile duct hematomas.  The splenomegaly had resolved.  MEDICATIONS:  Reviewed and updated.  ALLERGIES:  None.  REVIEW OF SYSTEMS:  A 10 point review of systems was negative.  PHYSICAL EXAM:  The patient is a well-appearing, well-nourished man in no distress.  Vitals:  Pulse 105, blood pressure 120/80, temperature 97.8, respirations 20, weight 141.8 pounds.  HEENT:  Head is atraumatic, normocephalic.  Sclerae anicteric.  Mouth:  No thrush.  Neck:  Supple without adenopathy.  Chest:  Good entry bilaterally, clear to both percussion and auscultation.  Cardiovascular:  First and second heart sounds present.  No added sounds or murmurs.  Abdomen:  Soft, nontender. No hepatosplenomegaly.  Bowel sounds present.  Extremities:  No calf tenderness or edema.  Lymph nodes:  No palpable adenopathy.  CNS: Nonfocal.  LAB DATA:  02/15/2012 white cell count 3, hemoglobin 12.9, hematocrit 37.9, platelets 132, ANC 1300.  Sodium 143, potassium 4.4, chloride 99, CO2 28, BUN 14, creatinine 1.1, glucose 102.  T bilirubin 1.3, alkaline phosphatase 99, AST 20, ALT 19, calcium 9.2.  LDH 115.  CEA less than 0.5.  IMPRESSION/PLAN:  Mr. Eastridge is a 71 year old man with: 1. Chronic lymphocytic  leukemia/small lymphocytic lymphoma with     history of adenopathy, thrombocytopenia and splenomegaly (negative     for deletion 17P and 11Q).  He is status post 6 cycles of FCR from     08/04/2011 through 12/30/2011.  He has had complete response to     therapy.  We will undergo surveillance.  He follows up in 4 months'     time.  He is to continue Bactrim and Valtrex for an additional 6     months. 2. History of colon cancer status post low anterior resection on     01/09/2008 for stage I, T1 N0 M0 poorly differentiated     adenocarcinoma of the colon.  Also undergoing surveillance.   ______________________________ Nira Retort, M.D. LIO/MEDQ  D:  02/17/2012  T:  02/17/2012  Job:  [id number]

## 2012-02-17 NOTE — Patient Instructions (Signed)
[name]  [id number]  [name] Discharge Instructions  RECOMMENDATIONS MADE BY THE CONSULTANT AND ANY TEST RESULTS WILL BE SENT TO YOUR REFERRING DOCTOR.   EXAM FINDINGS BY MD TODAY AND SIGNS AND SYMPTOMS TO REPORT TO CLINIC OR PRIMARY MD:   Your current list of medications are: Current Outpatient Prescriptions  Medication Sig Dispense Refill  . metFORMIN (GLUCOPHAGE) 500 MG tablet Take 500 mg by mouth 2 (two) times daily with a meal.      . ondansetron (ZOFRAN) 8 MG tablet Take 8 mg by mouth every 12 (twelve) hours as needed. Take 1 tab po  BID x 3 days - begin day after last dose chemo and  PRN  Nausea.       . simvastatin (ZOCOR) 80 MG tablet Take 80 mg by mouth at bedtime.        . sulfamethoxazole-trimethoprim (BACTRIM DS) 800-160 MG per tablet Take 1 tablet by mouth every Monday, Wednesday, and Friday. While on Cytoxan + Fludara + Rituxan      . valACYclovir (VALTREX) 500 MG tablet Take 250 mg by mouth Daily. While on Cytoxan + Fludara + Rituxan      . allopurinol (ZYLOPRIM) 300 MG tablet Take 300 mg by mouth daily.          INSTRUCTIONS GIVEN AND DISCUSSED:   SPECIAL INSTRUCTIONS/FOLLOW-UP:  See above.  I acknowledge that I have been informed and understand all the instructions given to me and received a copy. I do not have any more questions at this time, but understand that I may call the [facility] at [phone] during business hours should I have any further questions or need assistance in obtaining follow-up care.

## 2012-02-17 NOTE — Progress Notes (Signed)
This office note has been dictated.

## 2012-02-24 DIAGNOSIS — I10 Essential (primary) hypertension: Secondary | ICD-10-CM | POA: Diagnosis not present

## 2012-02-24 DIAGNOSIS — E119 Type 2 diabetes mellitus without complications: Secondary | ICD-10-CM | POA: Diagnosis not present

## 2012-02-24 DIAGNOSIS — G479 Sleep disorder, unspecified: Secondary | ICD-10-CM | POA: Diagnosis not present

## 2012-02-24 DIAGNOSIS — Z23 Encounter for immunization: Secondary | ICD-10-CM | POA: Diagnosis not present

## 2012-02-24 DIAGNOSIS — E785 Hyperlipidemia, unspecified: Secondary | ICD-10-CM | POA: Diagnosis not present

## 2012-03-22 ENCOUNTER — Other Ambulatory Visit: Payer: Self-pay | Admitting: Nurse Practitioner

## 2012-03-22 MED ORDER — VALACYCLOVIR HCL 500 MG PO TABS: 500.0000 mg | ORAL_TABLET | Freq: Every day | ORAL | Status: DC

## 2012-04-15 DIAGNOSIS — Z09 Encounter for follow-up examination after completed treatment for conditions other than malignant neoplasm: Secondary | ICD-10-CM | POA: Diagnosis not present

## 2012-04-15 DIAGNOSIS — Z85038 Personal history of other malignant neoplasm of large intestine: Secondary | ICD-10-CM | POA: Diagnosis not present

## 2012-04-15 DIAGNOSIS — Z980 Intestinal bypass and anastomosis status: Secondary | ICD-10-CM | POA: Diagnosis not present

## 2012-04-19 DIAGNOSIS — M7020 Olecranon bursitis, unspecified elbow: Secondary | ICD-10-CM | POA: Diagnosis not present

## 2012-06-03 ENCOUNTER — Telehealth: Payer: Self-pay | Admitting: *Deleted

## 2012-06-03 NOTE — Telephone Encounter (Signed)
Spoke with pt and gave pt new date and time for appt on 06/23/12  At  1000 am.   Instructed pt to keep lab appt as scheduled on 06/17/12.   Pt voiced understanding.

## 2012-06-17 ENCOUNTER — Other Ambulatory Visit (HOSPITAL_BASED_OUTPATIENT_CLINIC_OR_DEPARTMENT_OTHER): Payer: Medicare Other | Admitting: Lab

## 2012-06-17 ENCOUNTER — Other Ambulatory Visit: Payer: Medicare Other | Admitting: Lab

## 2012-06-17 DIAGNOSIS — C19 Malignant neoplasm of rectosigmoid junction: Secondary | ICD-10-CM | POA: Diagnosis not present

## 2012-06-17 DIAGNOSIS — C189 Malignant neoplasm of colon, unspecified: Secondary | ICD-10-CM

## 2012-06-17 DIAGNOSIS — C9110 Chronic lymphocytic leukemia of B-cell type not having achieved remission: Secondary | ICD-10-CM

## 2012-06-17 LAB — CBC WITH DIFFERENTIAL/PLATELET
BASO%: 0.3 % (ref 0.0–2.0)
Basophils Absolute: 0 10*3/uL (ref 0.0–0.1)
EOS%: 6 % (ref 0.0–7.0)
Eosinophils Absolute: 0.2 10*3/uL (ref 0.0–0.5)
HCT: 35.1 % — ABNORMAL LOW (ref 38.4–49.9)
HGB: 12.5 g/dL — ABNORMAL LOW (ref 13.0–17.1)
LYMPH%: 38.5 % (ref 14.0–49.0)
MCH: 31.6 pg (ref 27.2–33.4)
MCHC: 35.6 g/dL (ref 32.0–36.0)
MCV: 88.6 fL (ref 79.3–98.0)
MONO#: 0.3 10*3/uL (ref 0.1–0.9)
MONO%: 10.6 % (ref 0.0–14.0)
NEUT#: 1.3 10*3/uL — ABNORMAL LOW (ref 1.5–6.5)
NEUT%: 44.6 % (ref 39.0–75.0)
Platelets: 111 10*3/uL — ABNORMAL LOW (ref 140–400)
RBC: 3.96 10*6/uL — ABNORMAL LOW (ref 4.20–5.82)
RDW: 13.3 % (ref 11.0–14.6)
WBC: 3 10*3/uL — ABNORMAL LOW (ref 4.0–10.3)
lymph#: 1.2 10*3/uL (ref 0.9–3.3)
nRBC: 0 % (ref 0–0)

## 2012-06-17 LAB — COMPREHENSIVE METABOLIC PANEL (CC13)
ALT: 18 U/L (ref 0–55)
AST: 18 U/L (ref 5–34)
Albumin: 4.4 g/dL (ref 3.5–5.0)
Alkaline Phosphatase: 98 U/L (ref 40–150)
BUN: 20 mg/dL (ref 7.0–26.0)
CO2: 24 mEq/L (ref 22–29)
Calcium: 9.6 mg/dL (ref 8.4–10.4)
Chloride: 108 mEq/L — ABNORMAL HIGH (ref 98–107)
Creatinine: 1.4 mg/dL — ABNORMAL HIGH (ref 0.7–1.3)
Glucose: 89 mg/dl (ref 70–99)
Potassium: 4 mEq/L (ref 3.5–5.1)
Sodium: 142 mEq/L (ref 136–145)
Total Bilirubin: 1.4 mg/dL — ABNORMAL HIGH (ref 0.20–1.20)
Total Protein: 7 g/dL (ref 6.4–8.3)

## 2012-06-17 LAB — LACTATE DEHYDROGENASE (CC13): LDH: 131 U/L (ref 125–220)

## 2012-06-18 LAB — CEA: CEA: <0.5 ng/mL (ref 0.0–5.0)

## 2012-06-22 ENCOUNTER — Ambulatory Visit: Payer: Medicare Other | Admitting: Hematology and Oncology

## 2012-06-23 ENCOUNTER — Ambulatory Visit (HOSPITAL_BASED_OUTPATIENT_CLINIC_OR_DEPARTMENT_OTHER): Payer: Medicare Other | Admitting: Hematology and Oncology

## 2012-06-23 ENCOUNTER — Telehealth: Payer: Self-pay | Admitting: Hematology and Oncology

## 2012-06-23 ENCOUNTER — Encounter: Payer: Self-pay | Admitting: Hematology and Oncology

## 2012-06-23 VITALS — BP 122/60 | HR 89 | Temp 96.8°F | Resp 20 | Ht 67.5 in | Wt 146.8 lb

## 2012-06-23 DIAGNOSIS — C189 Malignant neoplasm of colon, unspecified: Secondary | ICD-10-CM | POA: Diagnosis not present

## 2012-06-23 DIAGNOSIS — C9110 Chronic lymphocytic leukemia of B-cell type not having achieved remission: Secondary | ICD-10-CM | POA: Diagnosis not present

## 2012-06-23 DIAGNOSIS — Z23 Encounter for immunization: Secondary | ICD-10-CM

## 2012-06-23 MED ORDER — VALACYCLOVIR HCL 500 MG PO TABS: 500.0000 mg | ORAL_TABLET | Freq: Every day | ORAL | Status: DC

## 2012-06-23 MED ORDER — INFLUENZA VIRUS VACC SPLIT PF IM SUSP
0.5000 mL | Freq: Once | INTRAMUSCULAR | Status: AC
  Administered 2012-06-23: 0.5 mL via INTRAMUSCULAR
  Filled 2012-06-23: qty 0.5

## 2012-06-23 NOTE — Patient Instructions (Signed)
Ellouise Newer  [id number]   [facility] - AFTER VISIT SUMMARY   **RECOMMENDATIONS MADE BY THE CONSULTANT AND ANY TEST    RESULTS WILL BE SENT TO YOUR REFERRING DOCTORS.   YOUR EXAM FINDINGS, LABS AND RESULTS WERE DISCUSSED BY YOUR MD TODAY.  YOU CAN GO TO THE [facility] WEB SITE FOR INSTRUCTIONS ON HOW TO ASSESS MY CHART FOR ADDITIONAL INFORMATION AS NEEDED.  Your Updated drug allergies are: Allergies as of 06/23/2012  . (No Known Allergies)    Your current list of medications are: Current Outpatient Prescriptions  Medication Sig Dispense Refill  . allopurinol (ZYLOPRIM) 300 MG tablet Take 300 mg by mouth daily.       . metFORMIN (GLUCOPHAGE) 500 MG tablet Take 500 mg by mouth 2 (two) times daily with a meal.      . ondansetron (ZOFRAN) 8 MG tablet Take 8 mg by mouth every 12 (twelve) hours as needed. Take 1 tab po  BID x 3 days - begin day after last dose chemo and  PRN  Nausea.       . simvastatin (ZOCOR) 80 MG tablet Take 80 mg by mouth at bedtime.        . sulfamethoxazole-trimethoprim (BACTRIM DS) 800-160 MG per tablet Take 1 tablet by mouth every Monday, Wednesday, and Friday. While on Cytoxan + Fludara + Rituxan      . valACYclovir (VALTREX) 500 MG tablet Take 1 tablet (500 mg total) by mouth daily. While on Cytoxan + Fludara + Rituxan  30 tablet  2     INSTRUCTIONS GIVEN AND DISCUSSED:  See attached schedule   SPECIAL INSTRUCTIONS/FOLLOW-UP:  See above.  I acknowledge that I have been informed and understand all the instructions given to me and received a copy.I know to contact the clinic, my physician, or go to the emergency Department if any problems should occur.   I do not have any more questions at this time, but understand that I may call the [facility] at [phone] during business hours should I have any further questions or need assistance in obtaining follow-up care.

## 2012-06-23 NOTE — Progress Notes (Signed)
CC:   [name], M.D. [name], M.D. Edsel Petrin. [name], M.D.  IDENTIFYING STATEMENT:  The patient is a 71 year old man with CLL and colon cancer who presents for followup.  INTERVAL HISTORY:  [name] has no current concerns or complaints since his last followup visit, 4 months ago.  He denies fever, chills, night sweats.  His weight is stable.  He is moving his bowels with no changes.  He denies pain.  He denies adenopathy.  MEDICATIONS:  Valtrex 500 mg daily, Bactrim DS Mondays, Wednesdays, and Fridays.  Rest of the medicines reviewed and updated.  ALLERGIES:  None.  PHYSICAL EXAMINATION:  General:  The patient is alert and oriented x3. Vitals:  Pulse 89, blood pressure 120/60, temperature 96.8, respirations 20, weight 146 pounds.  HEENT:  Sclerae anicteric.  Mouth moist.  Chest: Clear.  Abdomen:  Soft, nontender.  Bowel sounds present.  Extremities: No edema.  Lymph Nodes:  No palpable cervical, axillary, or inguinal adenopathy.  CNS:  Nonfocal.  LABORATORY: Results reviewed and stable.  IMPRESSION AND PLAN:  [name] is a 71 year old man with. 1. Chronic lymphocytic leukemia/small lymphocytic lymphoma with     history of adenopathy, thrombocytopenia, and splenomegaly (negative     for deletion 17P and 11Q).  He is status post 6 cycles of FCR from     08/04/2011 through 12/30/2011.  His exam and labs remain stable.     He will remain on Bactrim and Valtrex until the end of the year and     then discontinue.  He will receive a flu shot before he is     discharged from the clinic today. 2. History of colon cancer status post low anterior resection on     01/09/2008 for stage I, T1 N0 M0 poorly-differentiated     adenocarcinoma of the colon. He follows up in 4 months' time with labs and CT scan.    ______________________________ Nira Retort, M.D. LIO/MEDQ  D:  06/23/2012  T:  06/23/2012  Job:  [id number]

## 2012-06-23 NOTE — Telephone Encounter (Signed)
appts made and printed for pt aom °

## 2012-06-23 NOTE — Progress Notes (Signed)
This office note has been dictated.

## 2012-08-01 ENCOUNTER — Other Ambulatory Visit: Payer: Self-pay | Admitting: *Deleted

## 2012-08-01 DIAGNOSIS — C9110 Chronic lymphocytic leukemia of B-cell type not having achieved remission: Secondary | ICD-10-CM

## 2012-08-01 DIAGNOSIS — C189 Malignant neoplasm of colon, unspecified: Secondary | ICD-10-CM

## 2012-08-01 MED ORDER — SULFAMETHOXAZOLE-TMP DS 800-160 MG PO TABS: 1.0000 | ORAL_TABLET | ORAL | Status: DC

## 2012-09-17 ENCOUNTER — Telehealth: Payer: Self-pay | Admitting: Oncology

## 2012-09-17 NOTE — Telephone Encounter (Signed)
Gave pt appt for Dr. Beryle Beams on 10/25/12 after CT,pt is ok with appt

## 2012-10-01 ENCOUNTER — Encounter: Payer: Self-pay | Admitting: *Deleted

## 2012-10-19 DIAGNOSIS — I10 Essential (primary) hypertension: Secondary | ICD-10-CM | POA: Diagnosis not present

## 2012-10-19 DIAGNOSIS — G479 Sleep disorder, unspecified: Secondary | ICD-10-CM | POA: Diagnosis not present

## 2012-10-19 DIAGNOSIS — E785 Hyperlipidemia, unspecified: Secondary | ICD-10-CM | POA: Diagnosis not present

## 2012-10-19 DIAGNOSIS — E119 Type 2 diabetes mellitus without complications: Secondary | ICD-10-CM | POA: Diagnosis not present

## 2012-10-19 DIAGNOSIS — Z1331 Encounter for screening for depression: Secondary | ICD-10-CM | POA: Diagnosis not present

## 2012-10-24 ENCOUNTER — Ambulatory Visit (HOSPITAL_COMMUNITY)
Admission: RE | Admit: 2012-10-24 | Discharge: 2012-10-24 | Disposition: A | Discharge status: Home / Self Care | Payer: Medicare Other | Source: Ambulatory Visit | Attending: Hematology and Oncology | Admitting: Hematology and Oncology

## 2012-10-24 ENCOUNTER — Ambulatory Visit (HOSPITAL_COMMUNITY): Payer: Medicare Other

## 2012-10-24 ENCOUNTER — Other Ambulatory Visit: Payer: Self-pay | Admitting: Hematology and Oncology

## 2012-10-24 ENCOUNTER — Encounter (HOSPITAL_COMMUNITY): Payer: Self-pay

## 2012-10-24 ENCOUNTER — Other Ambulatory Visit (HOSPITAL_BASED_OUTPATIENT_CLINIC_OR_DEPARTMENT_OTHER): Payer: Medicare Other

## 2012-10-24 DIAGNOSIS — C9110 Chronic lymphocytic leukemia of B-cell type not having achieved remission: Secondary | ICD-10-CM

## 2012-10-24 DIAGNOSIS — C189 Malignant neoplasm of colon, unspecified: Secondary | ICD-10-CM | POA: Insufficient documentation

## 2012-10-24 DIAGNOSIS — K7689 Other specified diseases of liver: Secondary | ICD-10-CM | POA: Diagnosis not present

## 2012-10-24 DIAGNOSIS — N289 Disorder of kidney and ureter, unspecified: Secondary | ICD-10-CM | POA: Insufficient documentation

## 2012-10-24 LAB — CBC WITH DIFFERENTIAL/PLATELET
BASO%: 0.6 % (ref 0.0–2.0)
Basophils Absolute: 0 10*3/uL (ref 0.0–0.1)
EOS%: 4 % (ref 0.0–7.0)
Eosinophils Absolute: 0.1 10*3/uL (ref 0.0–0.5)
HCT: 40.6 % (ref 38.4–49.9)
HGB: 14.1 g/dL (ref 13.0–17.1)
LYMPH%: 34.9 % (ref 14.0–49.0)
MCH: 31.5 pg (ref 27.2–33.4)
MCHC: 34.8 g/dL (ref 32.0–36.0)
MCV: 90.7 fL (ref 79.3–98.0)
MONO#: 0.4 10*3/uL (ref 0.1–0.9)
MONO%: 12.2 % (ref 0.0–14.0)
NEUT#: 1.8 10*3/uL (ref 1.5–6.5)
NEUT%: 48.3 % (ref 39.0–75.0)
Platelets: 105 10*3/uL — ABNORMAL LOW (ref 140–400)
RBC: 4.48 10*6/uL (ref 4.20–5.82)
RDW: 13 % (ref 11.0–14.6)
WBC: 3.6 10*3/uL — ABNORMAL LOW (ref 4.0–10.3)
lymph#: 1.3 10*3/uL (ref 0.9–3.3)

## 2012-10-24 LAB — LACTATE DEHYDROGENASE (CC13): LDH: 126 U/L (ref 125–245)

## 2012-10-24 LAB — COMPREHENSIVE METABOLIC PANEL (CC13)
ALT: 19 U/L (ref 0–55)
AST: 18 U/L (ref 5–34)
Albumin: 4.4 g/dL (ref 3.5–5.0)
Alkaline Phosphatase: 128 U/L (ref 40–150)
BUN: 19 mg/dL (ref 7.0–26.0)
CO2: 25 mEq/L (ref 22–29)
Calcium: 10.1 mg/dL (ref 8.4–10.4)
Chloride: 104 mEq/L (ref 98–107)
Creatinine: 1.3 mg/dL (ref 0.7–1.3)
Glucose: 106 mg/dl — ABNORMAL HIGH (ref 70–99)
Potassium: 4.4 mEq/L (ref 3.5–5.1)
Sodium: 140 mEq/L (ref 136–145)
Total Bilirubin: 1.51 mg/dL — ABNORMAL HIGH (ref 0.20–1.20)
Total Protein: 7.8 g/dL (ref 6.4–8.3)

## 2012-10-24 MED ORDER — IOHEXOL 300 MG/ML  SOLN
100.0000 mL | Freq: Once | INTRAMUSCULAR | Status: AC | PRN
  Administered 2012-10-24: 100 mL via INTRAVENOUS

## 2012-10-25 ENCOUNTER — Ambulatory Visit (HOSPITAL_BASED_OUTPATIENT_CLINIC_OR_DEPARTMENT_OTHER): Payer: Medicare Other | Admitting: Oncology

## 2012-10-25 ENCOUNTER — Telehealth: Payer: Self-pay | Admitting: Oncology

## 2012-10-25 VITALS — BP 115/65 | HR 78 | Temp 98.0°F | Resp 20 | Ht 67.5 in | Wt 147.0 lb

## 2012-10-25 DIAGNOSIS — C189 Malignant neoplasm of colon, unspecified: Secondary | ICD-10-CM | POA: Diagnosis not present

## 2012-10-25 DIAGNOSIS — C9110 Chronic lymphocytic leukemia of B-cell type not having achieved remission: Secondary | ICD-10-CM

## 2012-10-25 NOTE — Telephone Encounter (Signed)
Gave pt appt for lab before MD visit April 2014

## 2012-10-25 NOTE — Progress Notes (Signed)
Hematology and Oncology Follow Up Visit  Shamil Bosscher [id number] [birth year] 72 y.o. 10/25/2012 7:50 PM   Principle Diagnosis: Encounter Diagnoses  Name Primary?  . Chronic lymphocytic leukemia Yes  . Colon cancer      Interim History:  I will be assuming the hematology care of this patient of Dr. Jamse Arn who left the practice.  Pleasant 72 year old man initially diagnosed with a stage II a T2, N0, M0 cancer of the distal sigmoid colon in April of 2009. He underwent a low anterior resection by Dr. Fanny Skates on 01/09/2008. Findings were a 1.7 cm poorly differentiated adenocarcinoma with positive vascular and lymphatic invasion but 15 out of 15 lymph nodes negative. He did not receive adjuvant chemotherapy. Routine laboratory evaluation on 05/08/2009 showed a normal total white count 6600 but a lymphocytosis of 60%. Hemoglobin 13.7. Platelets 93,000. Flow cytometry was done on the peripheral blood on 05/16/2009. Although there was a monoclonal B-cell population representing 18% of the gated cells, it did not meet diagnostic criteria for CLL. Blood counts were followed. Ultimately a bone marrow aspiration and biopsy were done on 11/28/2009. The biopsy specimen was inadequate for interpretation. However the aspirate showed now 82% of the gated cells  expressing CD5 and CD20 with lambda light chain restriction consistent with diagnosis of CLL. Cytogenetic studies did not show bad prognosis chromosome changes and specifically there was absence of a deletion of chromosome 17 or  a translocation 11; 14. He was observed until November 2012. CBC recorded on 07/21/2011 now showed a white count of 27,000 with 84% lymphocytes, 9% neutrophils, hemoglobin 12.7, and platelet count 95,000. CT scan showed nonbulky lymphadenopathy and splenomegaly. He was started on chemotherapy/immunotherapy with FCR on 08/04/2011 and received 6 cycles through 01/02/2012. By 11/03/2011 white count was 5200 with 64% neutrophils,  13% lymphocytes, hemoglobin 12.6, and platelet count 176,000. Hemoglobin has continued to improve and was 14.1 g today. I can't has remained low at 3600 with 48% neutrophils and 35% lymphocytes. There is a trend for rising lymphocyte percentage from nadir value of 13% in February 2 current value. Platelet count also slowly falling from peak count in February to today's value of 105,000. In addition to laboratory studies, he has been followed with periodic CT scans. Scan done this prior to initiating treatment on 06/19/2011 showed some scattered small lymph nodes largest 1.9 cm in the right leg 0, 1.2 cm in the left axilla, 1.3 cm precarinal, 1.5 cm right cardiophrenic angle, and it was read as "extensive abdominal pelvic lymphadenopathy" but the largest single lymph node noted was only 2.3 cm left external iliac and all other lymph nodes less than or equal to 1.7 cm. Spleen was measured as 15 cm.  CT scan chest abdomen and pelvis done in anticipation of today's visit yesterday on January 27 shows no enlarged lymph nodes in the chest abdomen or pelvis. Spleen appears normal. Measurements not given.  He is asymptomatic. No constitutional symptoms. He does admit to an episode of herpes zoster infection involving left sacral dermatome which antedated the chemotherapy treatment.  Abbreviated family history: He has 3 brothers and a sister who are healthy. Both his parents are deceased. He has a healthy son aged 84. No notes in the family had any malignancy. Abbreviated social history: He is originally from [place]. He works for the [employer] for many years. He stopped smoking 17 years ago. He does not use alcohol.  Medications: reviewed  Allergies: No Known Allergies  Review of Systems: Constitutional:  No constitutional symptoms Respiratory: No cough or dyspnea Cardiovascular:  No chest pain or palpitations Gastrointestinal: No abdominal pain. No change in bowel habit. No hematochezia  or melena Genito-Urinary: No hematuria Musculoskeletal: No muscle or bone pain Neurologic: No headache or change in vision Skin: No rash or ecchymosis Remaining ROS negative.  Physical Exam: Blood pressure 115/65, pulse 78, temperature 98 F (36.7 C), temperature source Oral, resp. rate 20, height 5' 7.5" (1.715 m), weight 147 lb (66.679 kg). Wt Readings from Last 3 Encounters:  10/25/12 147 lb (66.679 kg)  06/23/12 146 lb 12.8 oz (66.588 kg)  02/17/12 141 lb 12.8 oz (64.32 kg)     General appearance: Well-nourished African American man looking much younger than his stated age HENNT: Pharynx no erythema exudate or mass. Arcus senilis. Lymph nodes: No cervical, supraclavicular, axillary, or inguinal lymph nodes Breasts: Lungs: Clear to auscultation resonant to percussion Heart: Regular rhythm no murmur Abdomen: Soft, nontender, no mass, no organomegaly Extremities: No edema, no calf tenderness Vascular: No cyanosis Neurologic: Alert and oriented, PERRLA, I could not get a good look at his optic discs, motor strength 5 over 5, reflexes 2+ symmetric, sensation intact to vibration over the fingertips by tuning fork exam Skin: No rash or ecchymosis  Lab Results: Lab Results  Component Value Date   WBC 3.6* 10/24/2012   HGB 14.1 10/24/2012   HCT 40.6 10/24/2012   MCV 90.7 10/24/2012   PLT 105* 10/24/2012     Chemistry      Component Value Date/Time   NA 140 10/24/2012 1126   NA 143 02/15/2012 1404   NA 141 12/29/2011 1425   K 4.4 10/24/2012 1126   K 4.4 02/15/2012 1404   K 4.5 12/29/2011 1425   CL 104 10/24/2012 1126   CL 99 02/15/2012 1404   CL 105 12/29/2011 1425   CO2 25 10/24/2012 1126   CO2 28 02/15/2012 1404   CO2 28 12/29/2011 1425   BUN 19.0 10/24/2012 1126   BUN 14 02/15/2012 1404   BUN 18 12/29/2011 1425   CREATININE 1.3 10/24/2012 1126   CREATININE 1.0 02/15/2012 1404   CREATININE 1.08 12/29/2011 1425      Component Value Date/Time   CALCIUM 10.1 10/24/2012 1126   CALCIUM 9.2  02/15/2012 1404   CALCIUM 9.8 12/29/2011 1425   ALKPHOS 128 10/24/2012 1126   ALKPHOS 99* 02/15/2012 1404   ALKPHOS 105 12/29/2011 1425   AST 18 10/24/2012 1126   AST 20 02/15/2012 1404   AST 15 12/29/2011 1425   ALT 19 10/24/2012 1126   ALT 14 12/29/2011 1425   BILITOT 1.51* 10/24/2012 1126   BILITOT 1.30 02/15/2012 1404   BILITOT 0.8 12/29/2011 1425       Radiological Studies: Ct Soft Tissue Neck W Contrast  10/24/2012  *RADIOLOGY REPORT*  Clinical Data: Chronic lymphocytic leukemia.  Colon cancer.  CT NECK WITH CONTRAST  Technique:  Multidetector CT imaging of the neck was performed with intravenous contrast.  Contrast: 184mL OMNIPAQUE IOHEXOL 300 MG/ML  SOLN  Comparison: CT of the neck with contrast 06/19/2011  Findings: Limited imaging of the brain is unremarkable.  No significant mucosal or submucosal lesions are present.  The vocal cords are midline and symmetric.  Previously seen bilateral cervical adenopathy has resolved.  No significant adenopathy is present to suggest residual or recurrent disease.  The lung apices are clear.  Bone windows demonstrate no focal lytic or blastic lesion.  Mild degenerative changes are most evident at C3-4.  This is stable.  IMPRESSION:  1.  Interval resolution of bilateral cervical adenopathy. 2.  No evidence for residual or recurrent disease.   Original Report Authenticated By: San Morelle, M.D.    Ct Chest W Contrast  10/24/2012  *RADIOLOGY REPORT*  Clinical Data:  Colon cancer with CLL  CT CHEST, ABDOMEN AND PELVIS WITH CONTRAST  Technique:  Multidetector CT imaging of the chest, abdomen and pelvis was performed following the standard protocol during bolus administration of intravenous contrast.  Contrast: 126mL OMNIPAQUE IOHEXOL 300 MG/ML  SOLN  Comparison:  02/15/2012  CT CHEST  Findings:  There is no axillary, mediastinal, or hilar lymphadenopathy.  Heart size is normal.  No pericardial or pleural effusion.  Lungs are clear bilaterally.  Specifically, no  parenchymal nodule or mass in either lung.  Bone windows reveal no worrisome lytic or sclerotic osseous lesions.  IMPRESSION: No evidence for metastatic disease in the thorax.  CT ABDOMEN AND PELVIS  Findings:  Numerous tiny hypodense lesions are scattered throughout the liver parenchyma.  These are stable in the interval with no new or progressing liver lesions evident.  The largest lesion is in the posteromedial right liver and measures 12 mm, stable. The spleen, stomach, duodenum, pancreas, gallbladder, and adrenal glands are normal.  Small well-defined homogeneous low density lesions are seen in the cortex of each kidney, stable in the interval and most suggestive of a simple renal cysts.  No abdominal aortic aneurysm.  There is no lymphadenopathy in the abdomen. Lymph nodes in the left abdominal mesentery measure up to a maximum of about 7 mm in short axis.  No free fluid in the abdomen.  The abdominal bowel loops are unremarkable.  Imaging through the pelvis shows no free intraperitoneal fluid.  No pelvic sidewall lymphadenopathy.  Coarse dystrophic calcification noted in the central prostate.  There is an anastomotic suture line noted in the distal sigmoid colon.  No colonic diverticulitis.  The terminal ileum is normal.  The appendix is normal.  Bone windows reveal no worrisome lytic or sclerotic osseous lesions. Degenerative changes are noted at the right SI joint.  IMPRESSION: Stable exam.  No evidence for metastatic disease in the abdomen or pelvis.  No mesenteric or pelvic sidewall lymphadenopathy on today's study.   Original Report Authenticated By: Misty Stanley, M.D.    Ct Abdomen Pelvis W Contrast  10/24/2012  *RADIOLOGY REPORT*  Clinical Data:  Colon cancer with CLL  CT CHEST, ABDOMEN AND PELVIS WITH CONTRAST  Technique:  Multidetector CT imaging of the chest, abdomen and pelvis was performed following the standard protocol during bolus administration of intravenous contrast.  Contrast: 156mL  OMNIPAQUE IOHEXOL 300 MG/ML  SOLN  Comparison:  02/15/2012  CT CHEST  Findings:  There is no axillary, mediastinal, or hilar lymphadenopathy.  Heart size is normal.  No pericardial or pleural effusion.  Lungs are clear bilaterally.  Specifically, no parenchymal nodule or mass in either lung.  Bone windows reveal no worrisome lytic or sclerotic osseous lesions.  IMPRESSION: No evidence for metastatic disease in the thorax.  CT ABDOMEN AND PELVIS  Findings:  Numerous tiny hypodense lesions are scattered throughout the liver parenchyma.  These are stable in the interval with no new or progressing liver lesions evident.  The largest lesion is in the posteromedial right liver and measures 12 mm, stable. The spleen, stomach, duodenum, pancreas, gallbladder, and adrenal glands are normal.  Small well-defined homogeneous low density lesions are seen in the cortex of each kidney, stable  in the interval and most suggestive of a simple renal cysts.  No abdominal aortic aneurysm.  There is no lymphadenopathy in the abdomen. Lymph nodes in the left abdominal mesentery measure up to a maximum of about 7 mm in short axis.  No free fluid in the abdomen.  The abdominal bowel loops are unremarkable.  Imaging through the pelvis shows no free intraperitoneal fluid.  No pelvic sidewall lymphadenopathy.  Coarse dystrophic calcification noted in the central prostate.  There is an anastomotic suture line noted in the distal sigmoid colon.  No colonic diverticulitis.  The terminal ileum is normal.  The appendix is normal.  Bone windows reveal no worrisome lytic or sclerotic osseous lesions. Degenerative changes are noted at the right SI joint.  IMPRESSION: Stable exam.  No evidence for metastatic disease in the abdomen or pelvis.  No mesenteric or pelvic sidewall lymphadenopathy on today's study.   Original Report Authenticated By: Misty Stanley, M.D.     Impression and Plan: #1. Rai stage III chronic lymphocytic leukemia. Nonbulky  lymphadenopathy at diagnosis. Mild splenomegaly.  Nice Response to fludarabine, Cytoxan, Rituxan. Current blood counts somewhat hard to interpret. There is no lymphadenopathy or organomegaly on scan. Hemoglobin is now normal. There is persistent leukopenia. There is a trend for rising lymphocyte percentage. There is a trend for falling platelet count. However these may actually be due to the previous chemotherapy effect. Plan: I think we are safe to continue to monitor him without additional treatment at this time. I will see him again in 3 months. I don't see the value in getting repetitive CT scans unless we put him back on a treatment plan and he needs to be restaged.  #2. Stage II poorly differentiated adenocarcinoma distal sigmoid colon treated with primary surgery. No gross evidence for recurrent disease now out almost 5 years from diagnosis in April 2009  #3. History of herpes zoster infection This is quite common in association with CLL. He'll continue prophylactic Valtrex 500 mg daily.  #4. Type 2 diabetes for the last 7 years on oral agents  #5. Hyperlipidemia on Zocor   CC:.    Annia Belt, MD 1/28/20147:50 PM

## 2012-10-26 ENCOUNTER — Ambulatory Visit: Payer: Medicare Other | Admitting: Hematology and Oncology

## 2013-01-10 ENCOUNTER — Other Ambulatory Visit (HOSPITAL_BASED_OUTPATIENT_CLINIC_OR_DEPARTMENT_OTHER): Payer: Medicare Other

## 2013-01-10 DIAGNOSIS — C189 Malignant neoplasm of colon, unspecified: Secondary | ICD-10-CM

## 2013-01-10 DIAGNOSIS — C9110 Chronic lymphocytic leukemia of B-cell type not having achieved remission: Secondary | ICD-10-CM | POA: Diagnosis not present

## 2013-01-10 LAB — CBC & DIFF AND RETIC
BASO%: 0.3 % (ref 0.0–2.0)
Basophils Absolute: 0 10*3/uL (ref 0.0–0.1)
EOS%: 4.8 % (ref 0.0–7.0)
Eosinophils Absolute: 0.2 10*3/uL (ref 0.0–0.5)
HCT: 38.3 % — ABNORMAL LOW (ref 38.4–49.9)
HGB: 13.5 g/dL (ref 13.0–17.1)
Immature Retic Fract: 5.5 % (ref 3.00–10.60)
LYMPH%: 28.6 % (ref 14.0–49.0)
MCH: 31 pg (ref 27.2–33.4)
MCHC: 35.2 g/dL (ref 32.0–36.0)
MCV: 88 fL (ref 79.3–98.0)
MONO#: 0.3 10*3/uL (ref 0.1–0.9)
MONO%: 8.5 % (ref 0.0–14.0)
NEUT#: 2 10*3/uL (ref 1.5–6.5)
NEUT%: 57.8 % (ref 39.0–75.0)
Platelets: 105 10*3/uL — ABNORMAL LOW (ref 140–400)
RBC: 4.35 10*6/uL (ref 4.20–5.82)
RDW: 12.7 % (ref 11.0–14.6)
Retic %: 1.43 % (ref 0.80–1.80)
Retic Ct Abs: 62.21 10*3/uL (ref 34.80–93.90)
WBC: 3.5 10*3/uL — ABNORMAL LOW (ref 4.0–10.3)
lymph#: 1 10*3/uL (ref 0.9–3.3)

## 2013-01-10 LAB — COMPREHENSIVE METABOLIC PANEL (CC13)
ALT: 17 U/L (ref 0–55)
AST: 15 U/L (ref 5–34)
Albumin: 4 g/dL (ref 3.5–5.0)
Alkaline Phosphatase: 111 U/L (ref 40–150)
BUN: 13.7 mg/dL (ref 7.0–26.0)
CO2: 28 mEq/L (ref 22–29)
Calcium: 9.3 mg/dL (ref 8.4–10.4)
Chloride: 104 mEq/L (ref 98–107)
Creatinine: 1.3 mg/dL (ref 0.7–1.3)
Glucose: 198 mg/dl — ABNORMAL HIGH (ref 70–99)
Potassium: 4.1 mEq/L (ref 3.5–5.1)
Sodium: 141 mEq/L (ref 136–145)
Total Bilirubin: 1.28 mg/dL — ABNORMAL HIGH (ref 0.20–1.20)
Total Protein: 7.2 g/dL (ref 6.4–8.3)

## 2013-01-10 LAB — CEA: CEA: 0.8 ng/mL (ref 0.0–5.0)

## 2013-01-10 LAB — LACTATE DEHYDROGENASE (CC13): LDH: 125 U/L (ref 125–245)

## 2013-01-17 ENCOUNTER — Telehealth: Payer: Self-pay | Admitting: Oncology

## 2013-01-17 ENCOUNTER — Ambulatory Visit (HOSPITAL_BASED_OUTPATIENT_CLINIC_OR_DEPARTMENT_OTHER): Payer: Medicare Other | Admitting: Oncology

## 2013-01-17 VITALS — BP 148/68 | HR 93 | Temp 97.5°F | Resp 20 | Ht 67.5 in | Wt 148.0 lb

## 2013-01-17 DIAGNOSIS — E119 Type 2 diabetes mellitus without complications: Secondary | ICD-10-CM | POA: Diagnosis not present

## 2013-01-17 DIAGNOSIS — C186 Malignant neoplasm of descending colon: Secondary | ICD-10-CM | POA: Diagnosis not present

## 2013-01-17 DIAGNOSIS — C9110 Chronic lymphocytic leukemia of B-cell type not having achieved remission: Secondary | ICD-10-CM

## 2013-01-17 DIAGNOSIS — C189 Malignant neoplasm of colon, unspecified: Secondary | ICD-10-CM

## 2013-01-17 NOTE — Progress Notes (Signed)
Hematology and Oncology Follow Up Visit  [name] [id number] 12-04-1940 72 y.o. 01/17/2013 2:19 PM   Principle Diagnosis: Encounter Diagnoses  Name Primary?  . Chronic lymphocytic leukemia Yes  . Colon cancer      Interim History:   Followup visit for this 71 year old man with chronic lymphocytic leukemia and stage II colon cancer whose history is detailed in my summary note dated 10/25/2012. Colon cancer was resected in April 2009. CLL was diagnosed in August 2010. He did not require treatment until November 2012 when he was treated with FCR x6 cycles through April 2013. The chemotherapy put the CLL in remission but has caused chronic leukopenia and mild thrombocytopenia which have been stable. He had borderline splenomegaly at diagnosis. Most recent CT scan done 10/24/2012 showed no lymphadenopathy or splenomegaly.  He is doing well. He has not had any infections over the winter. He has had no interim medical problems.  Medications: reviewed  Allergies: No Known Allergies  Review of Systems: Constitutional:   No constitutional symptoms Respiratory: No cough or dyspnea Cardiovascular:  No chest pain or palpitations Gastrointestinal: No change in bowel habit Genito-Urinary: Not questioned Musculoskeletal: No muscle or bone pain Neurologic: No headache or change in vision Skin: No rash Remaining ROS negative.  Physical Exam: Blood pressure 148/68, pulse 93, temperature 97.5 F (36.4 C), temperature source Oral, resp. rate 20, height 5' 7.5" (1.715 m), weight 148 lb (67.132 kg), SpO2 98.00%. Wt Readings from Last 3 Encounters:  01/17/13 148 lb (67.132 kg)  10/25/12 147 lb (66.679 kg)  06/23/12 146 lb 12.8 oz (66.588 kg)     General appearance: Well-nourished African American man who looks much younger than his stated age HENNT: Pharynx no erythema or exudate Lymph nodes: No cervical, supraclavicular, or axillary adenopathy Breasts: Lungs: Clear to auscultation  resonant to percussion Heart: Regular rhythm no murmur Abdomen: Soft, nontender, no mass, no organomegaly Extremities: No edema, no calf tenderness Vascular: No cyanosis Neurologic: Motor strength 5 over 5  Skin: No rash or ecchymosis  Lab Results: Lab Results: White count differential: 58% neutrophils, 29% lymphocytes, 9% monocytes, 5% eosinophils   Component Value Date   WBC 3.5* 01/10/2013   HGB 13.5 01/10/2013   HCT 38.3* 01/10/2013   MCV 88.0 01/10/2013   PLT 105* 01/10/2013     Chemistry      Component Value Date/Time   NA 141 01/10/2013 1128   NA 143 02/15/2012 1404   NA 141 12/29/2011 1425   K 4.1 01/10/2013 1128   K 4.4 02/15/2012 1404   K 4.5 12/29/2011 1425   CL 104 01/10/2013 1128   CL 99 02/15/2012 1404   CL 105 12/29/2011 1425   CO2 28 01/10/2013 1128   CO2 28 02/15/2012 1404   CO2 28 12/29/2011 1425   BUN 13.7 01/10/2013 1128   BUN 14 02/15/2012 1404   BUN 18 12/29/2011 1425   CREATININE 1.3 01/10/2013 1128   CREATININE 1.0 02/15/2012 1404   CREATININE 1.08 12/29/2011 1425      Component Value Date/Time   CALCIUM 9.3 01/10/2013 1128   CALCIUM 9.2 02/15/2012 1404   CALCIUM 9.8 12/29/2011 1425   ALKPHOS 111 01/10/2013 1128   ALKPHOS 99* 02/15/2012 1404   ALKPHOS 105 12/29/2011 1425   AST 15 01/10/2013 1128   AST 20 02/15/2012 1404   AST 15 12/29/2011 1425   ALT 17 01/10/2013 1128   ALT 14 12/29/2011 1425   BILITOT 1.28* 01/10/2013 1128   BILITOT 1.30 02/15/2012 1404  BILITOT 0.8 12/29/2011 1425      Impression and Plan:  #1. Rai stage III chronic lymphocytic leukemia.  Nonbulky lymphadenopathy at diagnosis. Initial borderline splenomegaly.  Nice Response to fludarabine, Cytoxan, Rituxan.  There is a trend for falling platelet count. However these may actually be due to the previous chemotherapy effect. Platelet count today stable compare with labs done back in January. Plan:  continue to monitor him without additional treatment at this time. I will repeat lab in 3 months and a clinical  visit and again in 6 months with lab .  Marland Kitchen  #2. Stage II poorly differentiated adenocarcinoma distal sigmoid colon treated with primary surgery.  No gross evidence for recurrent disease now out almost 5 years from diagnosis in April 2009  #3. History of herpes zoster infection  This is quite common in association with CLL.  He'll continue prophylactic Valtrex 500 mg daily.  #4. Type 2 diabetes for the last 7 years on oral agents  #5. Hyperlipidemia on Zocor   CC:.    Annia Belt, MD 4/22/20142:19 PM

## 2013-01-23 ENCOUNTER — Other Ambulatory Visit: Payer: Self-pay | Admitting: *Deleted

## 2013-01-23 DIAGNOSIS — C9110 Chronic lymphocytic leukemia of B-cell type not having achieved remission: Secondary | ICD-10-CM

## 2013-01-23 DIAGNOSIS — C189 Malignant neoplasm of colon, unspecified: Secondary | ICD-10-CM

## 2013-01-23 MED ORDER — VALACYCLOVIR HCL 500 MG PO TABS: 500.0000 mg | ORAL_TABLET | Freq: Every day | ORAL | Status: DC

## 2013-01-23 MED ORDER — ONDANSETRON HCL 8 MG PO TABS: 8.0000 mg | ORAL_TABLET | Freq: Three times a day (TID) | ORAL | Status: DC | PRN

## 2013-01-25 ENCOUNTER — Encounter: Payer: Self-pay | Admitting: Oncology

## 2013-01-25 NOTE — Progress Notes (Signed)
[facility], [id number], approved ondansetron 8mg  30 tabs from 01/25/13-01/25/14.

## 2013-04-13 DIAGNOSIS — R509 Fever, unspecified: Secondary | ICD-10-CM | POA: Diagnosis not present

## 2013-04-13 DIAGNOSIS — E119 Type 2 diabetes mellitus without complications: Secondary | ICD-10-CM | POA: Diagnosis not present

## 2013-04-13 DIAGNOSIS — I10 Essential (primary) hypertension: Secondary | ICD-10-CM | POA: Diagnosis not present

## 2013-04-18 ENCOUNTER — Other Ambulatory Visit (HOSPITAL_BASED_OUTPATIENT_CLINIC_OR_DEPARTMENT_OTHER): Payer: Medicare Other

## 2013-04-18 ENCOUNTER — Other Ambulatory Visit: Payer: Self-pay | Admitting: *Deleted

## 2013-04-18 DIAGNOSIS — G479 Sleep disorder, unspecified: Secondary | ICD-10-CM | POA: Diagnosis not present

## 2013-04-18 DIAGNOSIS — Z0000 Encounter for general adult medical examination without abnormal findings: Secondary | ICD-10-CM | POA: Diagnosis not present

## 2013-04-18 DIAGNOSIS — C189 Malignant neoplasm of colon, unspecified: Secondary | ICD-10-CM

## 2013-04-18 DIAGNOSIS — C9110 Chronic lymphocytic leukemia of B-cell type not having achieved remission: Secondary | ICD-10-CM | POA: Diagnosis not present

## 2013-04-18 DIAGNOSIS — E119 Type 2 diabetes mellitus without complications: Secondary | ICD-10-CM | POA: Diagnosis not present

## 2013-04-18 DIAGNOSIS — E785 Hyperlipidemia, unspecified: Secondary | ICD-10-CM | POA: Diagnosis not present

## 2013-04-18 DIAGNOSIS — I10 Essential (primary) hypertension: Secondary | ICD-10-CM | POA: Diagnosis not present

## 2013-04-18 DIAGNOSIS — Z125 Encounter for screening for malignant neoplasm of prostate: Secondary | ICD-10-CM | POA: Diagnosis not present

## 2013-04-18 DIAGNOSIS — Z1331 Encounter for screening for depression: Secondary | ICD-10-CM | POA: Diagnosis not present

## 2013-04-18 LAB — CBC WITH DIFFERENTIAL/PLATELET
BASO%: 0.6 % (ref 0.0–2.0)
Basophils Absolute: 0 10*3/uL (ref 0.0–0.1)
EOS%: 7.8 % — ABNORMAL HIGH (ref 0.0–7.0)
Eosinophils Absolute: 0.4 10*3/uL (ref 0.0–0.5)
HCT: 34.8 % — ABNORMAL LOW (ref 38.4–49.9)
HGB: 12.1 g/dL — ABNORMAL LOW (ref 13.0–17.1)
LYMPH%: 16.9 % (ref 14.0–49.0)
MCH: 30.7 pg (ref 27.2–33.4)
MCHC: 34.6 g/dL (ref 32.0–36.0)
MCV: 88.7 fL (ref 79.3–98.0)
MONO#: 0.7 10*3/uL (ref 0.1–0.9)
MONO%: 13.1 % (ref 0.0–14.0)
NEUT#: 3.2 10*3/uL (ref 1.5–6.5)
NEUT%: 61.6 % (ref 39.0–75.0)
Platelets: 181 10*3/uL (ref 140–400)
RBC: 3.93 10*6/uL — ABNORMAL LOW (ref 4.20–5.82)
RDW: 12.8 % (ref 11.0–14.6)
WBC: 5.3 10*3/uL (ref 4.0–10.3)
lymph#: 0.9 10*3/uL (ref 0.9–3.3)

## 2013-04-19 LAB — CEA: CEA: 0.6 ng/mL (ref 0.0–5.0)

## 2013-04-24 ENCOUNTER — Telehealth: Payer: Self-pay | Admitting: *Deleted

## 2013-04-24 NOTE — Telephone Encounter (Signed)
Received message from pt's wife stating; "Martice has had a fever off/on, fatigued and not eating well for 2 weeks now"  Called and spoke with pt's wife; continues to state he has had a fever off and on for 2 weeks.  When asked what his temp is this am---she states  "it's 98.8 but it can go up to 101" Also reported he went to Urgent Care 04/14/13 and "they didn't do anything" Pt's wife states she has already called Dr. Noland Fordyce office this am and they will be calling her back also. Pt's wife requesting lab results from 04/18/13 taken here at [facility]; told labs were good.  Pt's wife expressed appreciation for information and stated "just want to cover all my bases since I've been out of town"  Instructed pt's wife to continue to follow-up with PCP and she verbalized understanding.

## 2013-04-25 DIAGNOSIS — R5383 Other fatigue: Secondary | ICD-10-CM | POA: Diagnosis not present

## 2013-04-25 DIAGNOSIS — B9789 Other viral agents as the cause of diseases classified elsewhere: Secondary | ICD-10-CM | POA: Diagnosis not present

## 2013-04-25 DIAGNOSIS — R5381 Other malaise: Secondary | ICD-10-CM | POA: Diagnosis not present

## 2013-05-10 ENCOUNTER — Other Ambulatory Visit: Payer: Self-pay | Admitting: Family Medicine

## 2013-05-10 ENCOUNTER — Ambulatory Visit
Admission: RE | Admit: 2013-05-10 | Discharge: 2013-05-10 | Disposition: A | Discharge status: Home / Self Care | Payer: Medicare Other | Source: Ambulatory Visit | Attending: Family Medicine | Admitting: Family Medicine

## 2013-05-10 DIAGNOSIS — R634 Abnormal weight loss: Secondary | ICD-10-CM | POA: Diagnosis not present

## 2013-05-10 DIAGNOSIS — R05 Cough: Secondary | ICD-10-CM

## 2013-05-10 DIAGNOSIS — R059 Cough, unspecified: Secondary | ICD-10-CM | POA: Diagnosis not present

## 2013-05-10 DIAGNOSIS — R7989 Other specified abnormal findings of blood chemistry: Secondary | ICD-10-CM | POA: Diagnosis not present

## 2013-05-10 DIAGNOSIS — R945 Abnormal results of liver function studies: Secondary | ICD-10-CM | POA: Diagnosis not present

## 2013-06-06 DIAGNOSIS — R748 Abnormal levels of other serum enzymes: Secondary | ICD-10-CM | POA: Diagnosis not present

## 2013-06-06 DIAGNOSIS — Z23 Encounter for immunization: Secondary | ICD-10-CM | POA: Diagnosis not present

## 2013-07-18 ENCOUNTER — Other Ambulatory Visit (HOSPITAL_BASED_OUTPATIENT_CLINIC_OR_DEPARTMENT_OTHER): Payer: Medicare Other | Admitting: Lab

## 2013-07-18 ENCOUNTER — Encounter (INDEPENDENT_AMBULATORY_CARE_PROVIDER_SITE_OTHER): Payer: Self-pay

## 2013-07-18 ENCOUNTER — Ambulatory Visit (HOSPITAL_BASED_OUTPATIENT_CLINIC_OR_DEPARTMENT_OTHER): Payer: Medicare Other | Admitting: Oncology

## 2013-07-18 VITALS — BP 143/81 | HR 89 | Temp 97.8°F | Resp 18 | Ht 67.0 in | Wt 144.7 lb

## 2013-07-18 DIAGNOSIS — C9110 Chronic lymphocytic leukemia of B-cell type not having achieved remission: Secondary | ICD-10-CM

## 2013-07-18 DIAGNOSIS — D696 Thrombocytopenia, unspecified: Secondary | ICD-10-CM

## 2013-07-18 DIAGNOSIS — C189 Malignant neoplasm of colon, unspecified: Secondary | ICD-10-CM

## 2013-07-18 DIAGNOSIS — Z85038 Personal history of other malignant neoplasm of large intestine: Secondary | ICD-10-CM

## 2013-07-18 DIAGNOSIS — E119 Type 2 diabetes mellitus without complications: Secondary | ICD-10-CM

## 2013-07-18 DIAGNOSIS — D72819 Decreased white blood cell count, unspecified: Secondary | ICD-10-CM

## 2013-07-18 LAB — COMPREHENSIVE METABOLIC PANEL (CC13)
ALT: 13 U/L (ref 0–55)
AST: 17 U/L (ref 5–34)
Albumin: 3.9 g/dL (ref 3.5–5.0)
Alkaline Phosphatase: 105 U/L (ref 40–150)
Anion Gap: 9 mEq/L (ref 3–11)
BUN: 12.7 mg/dL (ref 7.0–26.0)
CO2: 27 mEq/L (ref 22–29)
Calcium: 10.1 mg/dL (ref 8.4–10.4)
Chloride: 107 mEq/L (ref 98–109)
Creatinine: 0.9 mg/dL (ref 0.7–1.3)
Glucose: 118 mg/dl (ref 70–140)
Potassium: 4.5 mEq/L (ref 3.5–5.1)
Sodium: 143 mEq/L (ref 136–145)
Total Bilirubin: 0.78 mg/dL (ref 0.20–1.20)
Total Protein: 7.2 g/dL (ref 6.4–8.3)

## 2013-07-18 LAB — CBC WITH DIFFERENTIAL/PLATELET
BASO%: 0.3 % (ref 0.0–2.0)
Basophils Absolute: 0 10*3/uL (ref 0.0–0.1)
EOS%: 3.4 % (ref 0.0–7.0)
Eosinophils Absolute: 0.1 10*3/uL (ref 0.0–0.5)
HCT: 36.9 % — ABNORMAL LOW (ref 38.4–49.9)
HGB: 12.5 g/dL — ABNORMAL LOW (ref 13.0–17.1)
LYMPH%: 31.5 % (ref 14.0–49.0)
MCH: 30.1 pg (ref 27.2–33.4)
MCHC: 33.8 g/dL (ref 32.0–36.0)
MCV: 88.9 fL (ref 79.3–98.0)
MONO#: 0.4 10*3/uL (ref 0.1–0.9)
MONO%: 10 % (ref 0.0–14.0)
NEUT#: 2.4 10*3/uL (ref 1.5–6.5)
NEUT%: 54.8 % (ref 39.0–75.0)
Platelets: 127 10*3/uL — ABNORMAL LOW (ref 140–400)
RBC: 4.15 10*6/uL — ABNORMAL LOW (ref 4.20–5.82)
RDW: 13.7 % (ref 11.0–14.6)
WBC: 4.3 10*3/uL (ref 4.0–10.3)
lymph#: 1.4 10*3/uL (ref 0.9–3.3)

## 2013-07-18 LAB — LACTATE DEHYDROGENASE (CC13): LDH: 139 U/L (ref 125–245)

## 2013-07-18 NOTE — Progress Notes (Signed)
Hematology and Oncology Follow Up Visit  [name] BP:[id number] 04-07-1941 72 y.o. 07/18/2013 3:23 PM   Principle Diagnosis: Encounter Diagnoses  Name Primary?  . Chronic lymphocytic leukemia   . Colon cancer Yes     Interim History: Followup visit for this 72 year old man with chronic lymphocytic leukemia and stage II colon cancer whose history is detailed in my summary note dated 10/25/2012. Colon cancer was resected in April 2009. CLL was diagnosed in August 2010. He did not require treatment until November 2012 when he was treated with FCR x6 cycles through April 2013. The chemotherapy put the CLL in remission but has caused chronic leukopenia and mild thrombocytopenia which have been stable. He had borderline splenomegaly at diagnosis. Most recent CT scan done 10/24/2012 showed no lymphadenopathy or splenomegaly. His had no interim medical problems. He believes his last colonoscopy was 3 years ago. I don't have any documentation of this. My guess is that he did not have any polyps on followup since there is no pathology report since his initial colon cancer diagnosis in 2009. He's had no recent infections. He already had his flu vaccine for this season. He was also given the herpes zoster vaccine last year. Had a previous episode of herpes zoster. His wife is a Psychologist, occupational in our office.    Medications: reviewed  Allergies: No Known Allergies  Review of Systems: Hematology: negative for swollen glands, easy bruising, epistaxis, gum bleeding, hematuria, hematochezia ENT ROS: negative for - oral lesions or sore throat Breast ROS:  Respiratory ROS: negative for - cough, pleuritic pain, shortness of breath or wheezing Cardiovascular ROS: negative for - chest pain, dyspnea on exertion, edema, irregular heartbeat, murmur, orthopnea, palpitations, paroxysmal nocturnal dyspnea or rapid heart rate Gastrointestinal ROS: negative for - abdominal pain, appetite loss, blood in stools, change  in bowel habits, constipation, diarrhea, heartburn, hematemesis, melena, nausea/vomiting or swallowing difficulty/pain Genito-Urinary ROS: negative for - dysuria, hematuria, incontinence, irregular/heavy menses, nocturia or urinary frequency/urgency Musculoskeletal ROS: negative for - joint pain, joint stiffness, joint swelling, muscle pain, muscular weakness  Neurological ROS: negative for - behavioral changes, confusion, dizziness, gait disturbance, headaches, impaired coordination/balance, memory loss, numbness/tingling,  Dermatological ROS: negative for rash, ecchymosis Remaining ROS negative.  Physical Exam: Blood pressure 143/81, pulse 89, temperature 97.8 F (36.6 C), temperature source Oral, resp. rate 18, height 5\' 7"  (1.702 m), weight 144 lb 11.2 oz (65.635 kg). Wt Readings from Last 3 Encounters:  07/18/13 144 lb 11.2 oz (65.635 kg)  01/17/13 148 lb (67.132 kg)  10/25/12 147 lb (66.679 kg)     General appearance:  HENNT: Pharynx no erythema, exudate, mass, or ulcer. No thyromegaly or thyroid nodules Lymph nodes: No cervical, supraclavicular, or axillary lymphadenopathy Breasts: Lungs: Clear to auscultation, resonant to percussion throughout Heart: Regular rhythm, no murmur, no gallop, no rub, no click, no edema Abdomen: Soft, nontender, normal bowel sounds, no mass, no organomegaly Extremities: No edema, no calf tenderness Musculoskeletal: no joint deformities GU: Vascular: Carotid pulses 2+, no bruits, Neurologic: Alert, oriented, PERRLA,  cranial nerves grossly normal, motor strength 5 over 5, reflexes 1+ symmetric, upper body coordination normal, gait normal, Skin: No rash or ecchymosis  Lab Results: CBC W/Diff: White count differential: 55% neutrophils, 2% lymphocytes, 10% monocytes    Component Value Date/Time   WBC 4.3 07/18/2013 1358   WBC 6.9 01/11/2008 0446   RBC 4.15* 07/18/2013 1358   RBC 4.52 01/11/2008 0446   HGB 12.5* 07/18/2013 1358   HGB 16.3 06/16/2010  1335  HCT 36.9* 07/18/2013 1358   HCT 48.0 06/16/2010 1335   PLT 127* 07/18/2013 1358   PLT 117* 01/11/2008 0446   MCV 88.9 07/18/2013 1358   MCV 86.0 01/11/2008 0446   MCH 30.1 07/18/2013 1358   MCH 30.0 10/20/2010 1329   MCHC 33.8 07/18/2013 1358   MCHC 34.1 01/11/2008 0446   RDW 13.7 07/18/2013 1358   RDW 14.6 01/11/2008 0446   LYMPHSABS 1.4 07/18/2013 1358   LYMPHSABS 2.4 01/03/2008 0945   MONOABS 0.4 07/18/2013 1358   MONOABS 0.4 01/03/2008 0945   EOSABS 0.1 07/18/2013 1358   EOSABS 0.1 01/03/2008 0945   BASOSABS 0.0 07/18/2013 1358   BASOSABS 0.0 01/03/2008 0945     Chemistry      Component Value Date/Time   NA 143 07/18/2013 1358   NA 143 02/15/2012 1404   NA 141 12/29/2011 1425   K 4.5 07/18/2013 1358   K 4.4 02/15/2012 1404   K 4.5 12/29/2011 1425   CL 104 01/10/2013 1128   CL 99 02/15/2012 1404   CL 105 12/29/2011 1425   CO2 27 07/18/2013 1358   CO2 28 02/15/2012 1404   CO2 28 12/29/2011 1425   BUN 12.7 07/18/2013 1358   BUN 14 02/15/2012 1404   BUN 18 12/29/2011 1425   CREATININE 0.9 07/18/2013 1358   CREATININE 1.0 02/15/2012 1404   CREATININE 1.08 12/29/2011 1425      Component Value Date/Time   CALCIUM 10.1 07/18/2013 1358   CALCIUM 9.2 02/15/2012 1404   CALCIUM 9.8 12/29/2011 1425   ALKPHOS 105 07/18/2013 1358   ALKPHOS 99* 02/15/2012 1404   ALKPHOS 105 12/29/2011 1425   AST 17 07/18/2013 1358   AST 20 02/15/2012 1404   AST 15 12/29/2011 1425   ALT 13 07/18/2013 1358   ALT 19 02/15/2012 1404   ALT 14 12/29/2011 1425   BILITOT 0.78 07/18/2013 1358   BILITOT 1.30 02/15/2012 1404   BILITOT 0.8 12/29/2011 1425       Impression:   #1. Rai stage III chronic lymphocytic leukemia.  Nonbulky lymphadenopathy at diagnosis. Initial borderline splenomegaly.  Nice response to fludarabine, Cytoxan, Rituxan given between November 2012 and April 2013.  Blood counts remain stable with mild leukopenia but normal white count differential and mild fluctuating thrombocytopenia. Plan: repeat lab in 3  months and a clinical visit and again in 6 months with lab .  Marland Kitchen  #2. Stage II poorly differentiated adenocarcinoma distal sigmoid colon treated with primary surgery.  No gross evidence for recurrent disease now out over  5 years from diagnosis in April 2009   #3. History of herpes zoster infection   #4. Type 2 diabetes for the last 7 years on oral agents   #5. Hyperlipidemia on Lipitor   CC: Patient Care Team: [name], MD as PCP - General (Family Medicine)   Annia Belt, MD 10/21/20143:23 PM

## 2013-07-19 ENCOUNTER — Telehealth: Payer: Self-pay | Admitting: Oncology

## 2013-07-19 NOTE — Telephone Encounter (Signed)
sw pts wife Webb Silversmith pt aware of 01/16/14 appts per POF shh

## 2013-09-17 ENCOUNTER — Encounter (HOSPITAL_COMMUNITY): Payer: Self-pay | Admitting: Emergency Medicine

## 2013-09-17 ENCOUNTER — Emergency Department (HOSPITAL_COMMUNITY): Payer: Medicare Other

## 2013-09-17 ENCOUNTER — Observation Stay (HOSPITAL_COMMUNITY): Payer: Medicare Other

## 2013-09-17 ENCOUNTER — Observation Stay (HOSPITAL_COMMUNITY)
Admission: EM | Admit: 2013-09-17 | Discharge: 2013-09-18 | Disposition: A | Discharge status: Home / Self Care | Payer: Medicare Other | Attending: Internal Medicine | Admitting: Internal Medicine

## 2013-09-17 DIAGNOSIS — I498 Other specified cardiac arrhythmias: Secondary | ICD-10-CM | POA: Diagnosis not present

## 2013-09-17 DIAGNOSIS — E7800 Pure hypercholesterolemia, unspecified: Secondary | ICD-10-CM | POA: Diagnosis not present

## 2013-09-17 DIAGNOSIS — J09X2 Influenza due to identified novel influenza A virus with other respiratory manifestations: Secondary | ICD-10-CM | POA: Insufficient documentation

## 2013-09-17 DIAGNOSIS — R55 Syncope and collapse: Principal | ICD-10-CM | POA: Diagnosis present

## 2013-09-17 DIAGNOSIS — R031 Nonspecific low blood-pressure reading: Secondary | ICD-10-CM | POA: Insufficient documentation

## 2013-09-17 DIAGNOSIS — D696 Thrombocytopenia, unspecified: Secondary | ICD-10-CM | POA: Diagnosis not present

## 2013-09-17 DIAGNOSIS — C9110 Chronic lymphocytic leukemia of B-cell type not having achieved remission: Secondary | ICD-10-CM | POA: Insufficient documentation

## 2013-09-17 DIAGNOSIS — D72819 Decreased white blood cell count, unspecified: Secondary | ICD-10-CM | POA: Diagnosis not present

## 2013-09-17 DIAGNOSIS — Z79899 Other long term (current) drug therapy: Secondary | ICD-10-CM | POA: Insufficient documentation

## 2013-09-17 DIAGNOSIS — I10 Essential (primary) hypertension: Secondary | ICD-10-CM | POA: Diagnosis present

## 2013-09-17 DIAGNOSIS — J101 Influenza due to other identified influenza virus with other respiratory manifestations: Secondary | ICD-10-CM

## 2013-09-17 DIAGNOSIS — C189 Malignant neoplasm of colon, unspecified: Secondary | ICD-10-CM

## 2013-09-17 DIAGNOSIS — E119 Type 2 diabetes mellitus without complications: Secondary | ICD-10-CM

## 2013-09-17 DIAGNOSIS — R404 Transient alteration of awareness: Secondary | ICD-10-CM | POA: Diagnosis not present

## 2013-09-17 DIAGNOSIS — J111 Influenza due to unidentified influenza virus with other respiratory manifestations: Secondary | ICD-10-CM

## 2013-09-17 DIAGNOSIS — I369 Nonrheumatic tricuspid valve disorder, unspecified: Secondary | ICD-10-CM | POA: Diagnosis not present

## 2013-09-17 HISTORY — DX: Chronic lymphocytic leukemia of B-cell type not having achieved remission: C91.10

## 2013-09-17 LAB — GLUCOSE, CAPILLARY
Glucose-Capillary: 173 mg/dL — ABNORMAL HIGH (ref 70–99)
Glucose-Capillary: 199 mg/dL — ABNORMAL HIGH (ref 70–99)
Glucose-Capillary: 120 mg/dL — ABNORMAL HIGH (ref 70–99)

## 2013-09-17 LAB — URINALYSIS, ROUTINE W REFLEX MICROSCOPIC
Bilirubin Urine: NEGATIVE
Glucose, UA: NEGATIVE mg/dL
Hgb urine dipstick: NEGATIVE
Ketones, ur: NEGATIVE mg/dL
Leukocytes, UA: NEGATIVE
Nitrite: NEGATIVE
Protein, ur: 30 mg/dL — AB
Specific Gravity, Urine: 1.018 (ref 1.005–1.030)
Urobilinogen, UA: 0.2 mg/dL (ref 0.0–1.0)
pH: 5.5 (ref 5.0–8.0)

## 2013-09-17 LAB — COMPREHENSIVE METABOLIC PANEL
ALT: 14 U/L (ref 0–53)
AST: 17 U/L (ref 0–37)
Albumin: 4.4 g/dL (ref 3.5–5.2)
Alkaline Phosphatase: 117 U/L (ref 39–117)
BUN: 19 mg/dL (ref 6–23)
CO2: 27 mEq/L (ref 19–32)
Calcium: 9.9 mg/dL (ref 8.4–10.5)
Chloride: 104 mEq/L (ref 96–112)
Creatinine, Ser: 1.2 mg/dL (ref 0.50–1.35)
GFR calc Af Amer: 69 mL/min — ABNORMAL LOW (ref >90)
GFR calc non Af Amer: 60 mL/min — ABNORMAL LOW (ref >90)
Glucose, Bld: 137 mg/dL — ABNORMAL HIGH (ref 70–99)
Potassium: 3.7 mEq/L (ref 3.5–5.1)
Sodium: 142 mEq/L (ref 135–145)
Total Bilirubin: 1 mg/dL (ref 0.3–1.2)
Total Protein: 7.8 g/dL (ref 6.0–8.3)

## 2013-09-17 LAB — CBC WITH DIFFERENTIAL/PLATELET
Basophils Absolute: 0 10*3/uL (ref 0.0–0.1)
Basophils Relative: 0 % (ref 0–1)
Eosinophils Absolute: 0.1 10*3/uL (ref 0.0–0.7)
Eosinophils Relative: 3 % (ref 0–5)
HCT: 41.3 % (ref 39.0–52.0)
Hemoglobin: 14.5 g/dL (ref 13.0–17.0)
Lymphocytes Relative: 18 % (ref 12–46)
Lymphs Abs: 0.7 10*3/uL (ref 0.7–4.0)
MCH: 30.3 pg (ref 26.0–34.0)
MCHC: 35.1 g/dL (ref 30.0–36.0)
MCV: 86.2 fL (ref 78.0–100.0)
Monocytes Absolute: 0.4 10*3/uL (ref 0.1–1.0)
Monocytes Relative: 9 % (ref 3–12)
Neutro Abs: 2.8 10*3/uL (ref 1.7–7.7)
Neutrophils Relative %: 70 % (ref 43–77)
Platelets: 88 10*3/uL — ABNORMAL LOW (ref 150–400)
RBC: 4.79 MIL/uL (ref 4.22–5.81)
RDW: 12.8 % (ref 11.5–15.5)
WBC Morphology: INCREASED
WBC: 4 10*3/uL (ref 4.0–10.5)

## 2013-09-17 LAB — INFLUENZA PANEL BY PCR (TYPE A & B)
H1N1 flu by pcr: NOT DETECTED
Influenza A By PCR: POSITIVE — AB
Influenza B By PCR: NEGATIVE

## 2013-09-17 LAB — RAPID STREP SCREEN (MED CTR MEBANE ONLY): Streptococcus, Group A Screen (Direct): NEGATIVE

## 2013-09-17 LAB — URINE MICROSCOPIC-ADD ON

## 2013-09-17 LAB — TROPONIN I
Troponin I: <0.3 ng/mL (ref <0.30)
Troponin I: <0.3 ng/mL (ref <0.30)
Troponin I: <0.3 ng/mL (ref <0.30)

## 2013-09-17 LAB — LACTIC ACID, PLASMA: Lactic Acid, Venous: 1.5 mmol/L (ref 0.5–2.2)

## 2013-09-17 MED ORDER — OSELTAMIVIR PHOSPHATE 30 MG PO CAPS
30.0000 mg | ORAL_CAPSULE | Freq: Two times a day (BID) | ORAL | Status: DC
  Administered 2013-09-17 – 2013-09-18 (×2): 30 mg via ORAL
  Filled 2013-09-17 (×3): qty 1

## 2013-09-17 MED ORDER — SODIUM CHLORIDE 0.9 % IV BOLUS (SEPSIS)
500.0000 mL | Freq: Once | INTRAVENOUS | Status: AC
  Administered 2013-09-17: 500 mL via INTRAVENOUS

## 2013-09-17 MED ORDER — ASPIRIN EC 81 MG PO TBEC
81.0000 mg | DELAYED_RELEASE_TABLET | Freq: Every morning | ORAL | Status: DC
Start: 2013-09-17 — End: 2013-09-18
  Administered 2013-09-17 – 2013-09-18 (×2): 81 mg via ORAL
  Filled 2013-09-17 (×2): qty 1

## 2013-09-17 MED ORDER — OXYMETAZOLINE HCL 0.05 % NA SOLN
1.0000 | Freq: Two times a day (BID) | NASAL | Status: DC | PRN
  Administered 2013-09-17: 1 via NASAL
  Filled 2013-09-17: qty 15

## 2013-09-17 MED ORDER — INSULIN ASPART 100 UNIT/ML ~~LOC~~ SOLN
0.0000 [IU] | Freq: Three times a day (TID) | SUBCUTANEOUS | Status: DC
  Administered 2013-09-17 (×2): 2 [IU] via SUBCUTANEOUS

## 2013-09-17 MED ORDER — OSELTAMIVIR PHOSPHATE 75 MG PO CAPS
75.0000 mg | ORAL_CAPSULE | Freq: Two times a day (BID) | ORAL | Status: DC
  Filled 2013-09-17: qty 1

## 2013-09-17 MED ORDER — ENOXAPARIN SODIUM 40 MG/0.4ML ~~LOC~~ SOLN
40.0000 mg | SUBCUTANEOUS | Status: DC
  Administered 2013-09-17 – 2013-09-18 (×2): 40 mg via SUBCUTANEOUS
  Filled 2013-09-17 (×2): qty 0.4

## 2013-09-17 MED ORDER — ZOLPIDEM TARTRATE 5 MG PO TABS
2.5000 mg | ORAL_TABLET | Freq: Every day | ORAL | Status: DC
  Administered 2013-09-17: 2.5 mg via ORAL
  Filled 2013-09-17: qty 1

## 2013-09-17 MED ORDER — ATORVASTATIN CALCIUM 40 MG PO TABS
40.0000 mg | ORAL_TABLET | Freq: Every evening | ORAL | Status: DC
  Administered 2013-09-17: 40 mg via ORAL
  Filled 2013-09-17 (×2): qty 1

## 2013-09-17 MED ORDER — SALINE SPRAY 0.65 % NA SOLN
1.0000 | NASAL | Status: DC | PRN
  Filled 2013-09-17: qty 44

## 2013-09-17 MED ORDER — DEXTROSE 5 % IV SOLN
1.0000 g | INTRAVENOUS | Status: DC
  Administered 2013-09-17: 1 g via INTRAVENOUS
  Filled 2013-09-17: qty 10

## 2013-09-17 MED ORDER — INSULIN ASPART 100 UNIT/ML ~~LOC~~ SOLN: 0.0000 [IU] | Freq: Every day | SUBCUTANEOUS | Status: DC

## 2013-09-17 MED ORDER — SODIUM CHLORIDE 0.9 % IJ SOLN
3.0000 mL | Freq: Two times a day (BID) | INTRAMUSCULAR | Status: DC
  Administered 2013-09-17 – 2013-09-18 (×3): 3 mL via INTRAVENOUS

## 2013-09-17 NOTE — Progress Notes (Signed)
Positive for flu A, MD aware, see new orders

## 2013-09-17 NOTE — Progress Notes (Signed)
Echocardiogram 2D Echocardiogram has been performed.  Doyle Askew 09/17/2013, 1:36 PM

## 2013-09-17 NOTE — H&P (Addendum)
[facility] History and Physical  [name] [id number] DOB: 1940-12-26 DOA: 09/17/2013  Referring physician:  Linton Flemings PCP:  Vena Austria, MD   Chief Complaint:  syncope  HPI:  The patient is a 72 y.o. year-old male with history of colorectal cancer, chronic lymphocytic leukemia, type 2 diabetes mellitus, hypertension, hypercholesterolemia, syncope related to bacteremia and viral illness many years ago who presents with syncope.  The patient was last at their baseline health about 4 days ago.  3 days ago he developed chest congestion with cough which was nonproductive, muscle aches and pains, low-grade fevers and chills. Over the last couple of days, he has had worsening sinus congestion with rhinorrhea and sore throat. He has been eating and drinking well and denies nausea, vomiting, and diarrhea. He denies dysuria. This morning around 5 AM he got up to use the restroom and to use sore throat spray. His wife states she woke up around 5:00 AM because she heard a loud thud in the bathroom. She eventually got out of bed and her husband was in the bathroom applying his sore throat spray. He states that he dropped a sore throat spray bottle which caused a loud noise, however, his wife is not sure if he actually remembers that or if he potentially passed out and does not recall.  She went to the bathroom. She was sitting on the toilet in the water closet with the door open when her husband, who had been standing at the mere, suddenly fell face  forward onto the floor, right in front of her. He was passed out cold. She initially thought he might have died. There was no witnessed seizure activity such as jerking or shaking and he did not lose control of his bowels or bladder. After a minute or 2, he awoke. He asked to go back to bed. He did not have any confusion however he cannot remember his syncopal spell.  He had no premonition-like symptoms prior to the event.  His wife denies that  he had any focal numbness or weakness, facial droop. He did not have any slurred speech. She convinced him to come to the emergency room for evaluation.  In the emergency department, his vital signs were stable. His orthostatics were negative. His labs were notable for platelets of 88, negative troponin. His chest x-ray was negative, his urinalysis unremarkable. His head CT demonstrated no evidence of stroke or hemorrhage. He did have some right maxillary sinusitis. His EKG with normal sinus rhythm and unchanged from prior. He is being admitted for syncopal episode.  Review of Systems:  General:  Positive fevers, chills, denies weight loss or gain HEENT:  Positive rhinorrhea, sinus congestion, sore throat CV:  Denies chest pain and palpitations, lower extremity edema.  PULM:  Denies SOB, wheezing, positive cough.   GI:  Denies nausea, vomiting, constipation, diarrhea.   GU:  Denies dysuria, frequency, urgency ENDO:  Denies polyuria, polydipsia.   HEME:  Denies hematemesis, blood in stools, melena, abnormal bruising or bleeding.  LYMPH:  Denies lymphadenopathy.   MSK:  Denies arthralgias, positive myalgias.   DERM:  Denies skin rash or ulcer.   NEURO:  Denies focal numbness, weakness, slurred speech, confusion, facial droop.  PSYCH:  Denies anxiety and depression.    Past Medical History  Diagnosis Date  . Colorectal cancer     COLORECTAL DX 2/09, treated surgically  . Diabetes mellitus   . Colon cancer   . Pneumonia     Bilateral pneumonia  .  Hypertension   . Hypercholesterolemia   . CLL (chronic lymphocytic leukemia)     chemotherapy   Past Surgical History  Procedure Laterality Date  . Nasal polyps      S/P nasal surgery  . Hemicolectomy     Social History:  reports that he has quit smoking. His smoking use included Cigarettes. He has a 28 pack-year smoking history. He does not have any smokeless tobacco history on file. He reports that he does not drink alcohol or use illicit  drugs. Lives with wife.  Drives, does not need a cane or walker to get around,.   No Known Allergies  Family History  Problem Relation Age of Onset  . Ovarian cancer Mother   . Breast cancer Maternal Aunt      Prior to Admission medications   Medication Sig Start Date End Date Taking? Authorizing Provider  aspirin 81 MG tablet Take 81 mg by mouth every morning.    Yes Historical Provider, MD  atorvastatin (LIPITOR) 40 MG tablet Take 40 mg by mouth every evening.  01/10/13  Yes Historical Provider, MD  lisinopril (PRINIVIL,ZESTRIL) 5 MG tablet Take 5 mg by mouth daily as needed (high blood pressure). As needed 04/18/13  Yes Historical Provider, MD  metFORMIN (GLUCOPHAGE) 500 MG tablet Take 500 mg by mouth 2 (two) times daily with a meal.   Yes Historical Provider, MD  zolpidem (AMBIEN) 5 MG tablet Take 1.5 mg by mouth at bedtime. Pt breaks tablet into thirds and takes 1/3 tablet nightly   Yes Historical Provider, MD   Physical Exam: Filed Vitals:   09/17/13 0744 09/17/13 0800 09/17/13 0830 09/17/13 0900  BP: 129/52 139/58 141/67 127/58  Pulse: 88 89 100 89  Temp:      TempSrc:      Resp: 17 21 17 19   SpO2: 98% 99% 100% 97%     General:  Thin African American male, no acute distress  Eyes:  PERRL, anicteric, non-injected.  ENT:  Nares with clear rhinorrhea, injection, and swelling.  OP with mild tonsillar enlargement and erythema without exudate or plaques.  MMM.  Neck:  Supple without TM or JVD.    Lymph:  No cervical, supraclavicular, or submandibular LAD.  Cardiovascular:  RRR, normal S1, S2, intermittent 2/6 systolic murmur at the right sternal border, possible S4..  2+ pulses, warm extremities  Respiratory:  CTA bilaterally without increased WOB.  Abdomen:  NABS.  Soft, ND/NT.    Skin:  No rashes or focal lesions.  Musculoskeletal:  Normal bulk and tone.  No LE edema.  Psychiatric:  A & O x 4.  Appropriate affect.  Neurologic:  CN 3-12 intact.  5/5 strength.   Sensation intact.  No dysmetria, gait stable.  Labs on Admission:  Basic Metabolic Panel:  Recent Labs Lab 09/17/13 0652  NA 142  K 3.7  CL 104  CO2 27  GLUCOSE 137*  BUN 19  CREATININE 1.20  CALCIUM 9.9   Liver Function Tests:  Recent Labs Lab 09/17/13 0652  AST 17  ALT 14  ALKPHOS 117  BILITOT 1.0  PROT 7.8  ALBUMIN 4.4   No results found for this basename: LIPASE, AMYLASE,  in the last 168 hours No results found for this basename: AMMONIA,  in the last 168 hours CBC:  Recent Labs Lab 09/17/13 0652  WBC 4.0  NEUTROABS 2.8  HGB 14.5  HCT 41.3  MCV 86.2  PLT 88*   Cardiac Enzymes:  Recent Labs Lab 09/17/13 [phone]  TROPONINI <0.30    BNP (last 3 results) No results found for this basename: PROBNP,  in the last 8760 hours CBG: No results found for this basename: GLUCAP,  in the last 168 hours  Radiological Exams on Admission: Ct Head Wo Contrast  09/17/2013   CLINICAL DATA:  Syncope.  EXAM: CT HEAD WITHOUT CONTRAST  TECHNIQUE: Contiguous axial images were obtained from the base of the skull through the vertex without intravenous contrast.  COMPARISON:  None.  FINDINGS: Mild age related volume loss. No acute intracranial abnormality. Specifically, no hemorrhage, hydrocephalus, mass lesion, acute infarction, or significant intracranial injury. No acute calvarial abnormality.  Air-fluid level partially imaged in the right maxillary sinus, question acute sinusitis. Mastoid air cells and remainder of the paranasal sinuses are clear. Orbital soft tissues unremarkable.  IMPRESSION: No acute intracranial abnormality.  Question acute right maxillary sinusitis.   Electronically Signed   By: Rolm Baptise M.D.   On: 09/17/2013 08:23   Dg Chest Portable 1 View  09/17/2013   CLINICAL DATA:  Loss of conscious  EXAM: PORTABLE CHEST - 1 VIEW  COMPARISON:  05/10/2013  FINDINGS: The heart size and mediastinal contours are within normal limits. Both lungs are clear. The  visualized skeletal structures are unremarkable.  IMPRESSION: No active disease.   Electronically Signed   By: Jorje Guild M.D.   On: 09/17/2013 07:10    EKG: Normal sinus rhythm without ischemic changes.  Assessment/Plan Principal Problem:   Syncope Active Problems:   Chronic lymphocytic leukemia   Colon cancer   Hypertension   Hypercholesterolemia   Diabetes mellitus  ---  Syncope, likely vasovagal secondary to acute infection. The patient has history of syncope with bacteremia previously. He did not have a premonition of the events and has no memory of the event which is concerning for dysrhythmia. -  Followup urine culture and blood cultures -  Monitor in telemetry -  Cycle troponins -  ECHO (due to murmur, possible gallop)  Acute febrile illness with bandemia, immune response may be compromised by CLL s/p chemo and chronic leukopenia -  Flu PCR -  Droplet precautions -  Rapid strep -  Start ceftriaxone for sinusitis.  Would normally not treat this early, but given 1. Hx of bacteremia associated syncope and 2. Illness may be severe enough to cause syncope, will start treatment anyway.  Could transition to augmentin or d/c altogether if cultures are negative.    HTN, BP low normal, Hold lisinopril for now  HLD, stable, continue atorvastatin  T2DM -  Hold metformin and start SSI -  A1c followed by PCP so will defer for now  CLL, s/p FCR x 6 cycles in 2013 with residual chronic leukopenia and thrombocytopenia -  Trend WBC and plt -  Will add Dr. Beryle Beams to treatment team list  Diet:  Diabetic  Access:  PIV IVF:  off Proph:  lovenox (as long as plt > 50K)  Code Status: DNR Family Communication: patient and wife Disposition Plan: Admit to telemetry  Time spent: 60 min Janece Canterbury [facility] Pager [phone]  If 7PM-7AM, please contact night-coverage [email or web address] Password [facility] 09/17/2013, 9:24 AM

## 2013-09-17 NOTE — Progress Notes (Signed)
Agreed with the previous nurse assessment. No new changed noted.

## 2013-09-17 NOTE — ED Provider Notes (Signed)
CSN: [id number]     Arrival date & time 09/17/13  0540 History   First MD Initiated Contact with Patient 09/17/13 0602     Chief Complaint  Patient presents with  . Loss of Consciousness   (Consider location/radiation/quality/duration/timing/severity/associated sxs/prior Treatment) Patient is a 72 y.o. male presenting with syncope. The history is provided by the patient and the spouse. No language interpreter was used.  Loss of Consciousness Episode history:  Single Associated symptoms: no chest pain, no confusion, no fever, no nausea, no shortness of breath and no vomiting   Associated symptoms comment:  He woke this morning around 5:00 a.m. to go to the bathroom. While in the bathroom, his wife reports she heard something that sounded like he fell. She called out to him and he said he was fine. She got up to go to the bathroom herself, and states that the patient then passed out in front of her, falling forward. He has no memory of being in the bathroom. Syncopal episode lasted a few minutes. He denies chest pain, shortness of breath, recent or current nausea, vomiting, febrile illness. He has had cold symptoms of sore throat and non-productive cough for a couple of days. His wife states he has had a similar episode in the past that was the result of sepsis.   Past Medical History  Diagnosis Date  . Colorectal cancer     COLORECTAL DX 2/09  . Diabetes mellitus   . Colon cancer   . Pneumonia     Bilateral pneumonia  . Hypertension   . Hypercholesterolemia    Past Surgical History  Procedure Laterality Date  . Nasal polyps      S/P nasal surgery   Family History  Problem Relation Age of Onset  . Ovarian cancer Mother    History  Substance Use Topics  . Smoking status: Former Smoker -- 1.00 packs/day for 35 years    Types: Cigarettes  . Smokeless tobacco: Not on file  . Alcohol Use: No    Review of Systems  Constitutional: Negative for fever and chills.  HENT: Positive  for congestion and sinus pressure.   Respiratory: Positive for cough. Negative for shortness of breath.   Cardiovascular: Positive for syncope. Negative for chest pain.  Gastrointestinal: Negative.  Negative for nausea, vomiting, abdominal pain and diarrhea.  Genitourinary: Negative.  Negative for dysuria and flank pain.  Musculoskeletal: Negative.  Negative for myalgias.  Skin: Negative.  Negative for rash.  Neurological: Positive for syncope.  Psychiatric/Behavioral: Negative.  Negative for confusion.    Allergies  Review of patient's allergies indicates no known allergies.  Home Medications   Current Outpatient Rx  Name  Route  Sig  Dispense  Refill  . aspirin 81 MG tablet   Oral   Take 81 mg by mouth every morning.          Marland Kitchen atorvastatin (LIPITOR) 40 MG tablet   Oral   Take 40 mg by mouth every evening.          Marland Kitchen lisinopril (PRINIVIL,ZESTRIL) 5 MG tablet   Oral   Take 5 mg by mouth daily as needed (high blood pressure). As needed         . metFORMIN (GLUCOPHAGE) 500 MG tablet   Oral   Take 500 mg by mouth 2 (two) times daily with a meal.         . zolpidem (AMBIEN) 5 MG tablet   Oral   Take 1.5 mg by mouth at  bedtime. Pt breaks tablet into thirds and takes 1/3 tablet nightly          BP 129/52  Pulse 88  Temp(Src) 97.9 F (36.6 C) (Oral)  Resp 17  SpO2 98% Physical Exam  Constitutional: He is oriented to person, place, and time. He appears well-developed and well-nourished. No distress.  HENT:  Head: Normocephalic.  Mouth/Throat: Oropharynx is clear and moist.  Eyes: Conjunctivae are normal.  Neck: Normal range of motion. Neck supple.  Cardiovascular: Normal rate and regular rhythm.   Pulmonary/Chest: Effort normal and breath sounds normal.  Abdominal: Soft. Bowel sounds are normal. There is no tenderness. There is no rebound and no guarding.  Musculoskeletal: Normal range of motion. He exhibits no edema.  Neurological: He is alert and oriented  to person, place, and time. Coordination normal.  Negative Romberg, no pronator drift.  Skin: Skin is warm and dry. No rash noted.  Psychiatric: He has a normal mood and affect.    ED Course  Procedures (including critical care time) Labs Review Labs Reviewed  CBC WITH DIFFERENTIAL - Abnormal; Notable for the following:    Platelets 88 (*)    All other components within normal limits  COMPREHENSIVE METABOLIC PANEL - Abnormal; Notable for the following:    Glucose, Bld 137 (*)    GFR calc non Af Amer 60 (*)    GFR calc Af Amer 69 (*)    All other components within normal limits  URINALYSIS, ROUTINE W REFLEX MICROSCOPIC - Abnormal; Notable for the following:    APPearance CLOUDY (*)    Protein, ur 30 (*)    All other components within normal limits  URINE MICROSCOPIC-ADD ON - Abnormal; Notable for the following:    Casts HYALINE CASTS (*)    All other components within normal limits  URINE CULTURE  CULTURE, BLOOD (ROUTINE X 2)  CULTURE, BLOOD (ROUTINE X 2)  LACTIC ACID, PLASMA  TROPONIN I   Results for orders placed during the hospital encounter of 09/17/13  CBC WITH DIFFERENTIAL      Result Value Range   WBC 4.0  4.0 - 10.5 K/uL   RBC 4.79  4.22 - 5.81 MIL/uL   Hemoglobin 14.5  13.0 - 17.0 g/dL   HCT 41.3  39.0 - 52.0 %   MCV 86.2  78.0 - 100.0 fL   MCH 30.3  26.0 - 34.0 pg   MCHC 35.1  30.0 - 36.0 g/dL   RDW 12.8  11.5 - 15.5 %   Platelets 88 (*) 150 - 400 K/uL   Neutrophils Relative % 70  43 - 77 %   Neutro Abs 2.8  1.7 - 7.7 K/uL   Lymphocytes Relative 18  12 - 46 %   Lymphs Abs 0.7  0.7 - 4.0 K/uL   Monocytes Relative 9  3 - 12 %   Monocytes Absolute 0.4  0.1 - 1.0 K/uL   Eosinophils Relative 3  0 - 5 %   Eosinophils Absolute 0.1  0.0 - 0.7 K/uL   Basophils Relative 0  0 - 1 %   Basophils Absolute 0.0  0.0 - 0.1 K/uL  COMPREHENSIVE METABOLIC PANEL      Result Value Range   Sodium 142  135 - 145 mEq/L   Potassium 3.7  3.5 - 5.1 mEq/L   Chloride 104  96 - 112  mEq/L   CO2 27  19 - 32 mEq/L   Glucose, Bld 137 (*) 70 - 99 mg/dL  BUN 19  6 - 23 mg/dL   Creatinine, Ser 1.20  0.50 - 1.35 mg/dL   Calcium 9.9  8.4 - 10.5 mg/dL   Total Protein 7.8  6.0 - 8.3 g/dL   Albumin 4.4  3.5 - 5.2 g/dL   AST 17  0 - 37 U/L   ALT 14  0 - 53 U/L   Alkaline Phosphatase 117  39 - 117 U/L   Total Bilirubin 1.0  0.3 - 1.2 mg/dL   GFR calc non Af Amer 60 (*) >90 mL/min   GFR calc Af Amer 69 (*) >90 mL/min  LACTIC ACID, PLASMA      Result Value Range   Lactic Acid, Venous 1.5  0.5 - 2.2 mmol/L  URINALYSIS, ROUTINE W REFLEX MICROSCOPIC      Result Value Range   Color, Urine YELLOW  YELLOW   APPearance CLOUDY (*) CLEAR   Specific Gravity, Urine 1.018  1.005 - 1.030   pH 5.5  5.0 - 8.0   Glucose, UA NEGATIVE  NEGATIVE mg/dL   Hgb urine dipstick NEGATIVE  NEGATIVE   Bilirubin Urine NEGATIVE  NEGATIVE   Ketones, ur NEGATIVE  NEGATIVE mg/dL   Protein, ur 30 (*) NEGATIVE mg/dL   Urobilinogen, UA 0.2  0.0 - 1.0 mg/dL   Nitrite NEGATIVE  NEGATIVE   Leukocytes, UA NEGATIVE  NEGATIVE  TROPONIN I      Result Value Range   Troponin I <0.30  <0.30 ng/mL  URINE MICROSCOPIC-ADD ON      Result Value Range   Casts HYALINE CASTS (*) NEGATIVE    Imaging Review Dg Chest Portable 1 View  09/17/2013   CLINICAL DATA:  Loss of conscious  EXAM: PORTABLE CHEST - 1 VIEW  COMPARISON:  05/10/2013  FINDINGS: The heart size and mediastinal contours are within normal limits. Both lungs are clear. The visualized skeletal structures are unremarkable.  IMPRESSION: No active disease.   Electronically Signed   By: Jorje Guild M.D.   On: 09/17/2013 07:10    EKG Interpretation    Date/Time:  Sunday September 17 2013 06:08:56 EST Ventricular Rate:  75 PR Interval:  204 QRS Duration: 74 QT Interval:  359 QTC Calculation: 401 R Axis:   25 Text Interpretation:  Sinus rhythm Consider left atrial enlargement Confirmed by OTTER  MD, OLGA [id number]) on 09/17/2013 7:52:17 AM             MDM  No diagnosis found. 1. Syncope  He remains comfortable and symptomatic here. Ambulates without lightheadedness or recurrent syncope. Discussed with Dr. Sharol Given. Will admit for syncope in elderly.     Dewaine Oats, PA-C 09/17/13 [phone]

## 2013-09-17 NOTE — ED Provider Notes (Signed)
Medical screening examination/treatment/procedure(s) were conducted as a shared visit with non-physician practitioner(s) and myself.  I personally evaluated the patient during the encounter.  Pt with syncope this am, unprovoked.  H/o same associated with delayed sepsis in the past.  Plan for admission.  EKG Interpretation    Date/Time:  Sunday September 17 2013 06:08:56 EST Ventricular Rate:  75 PR Interval:  204 QRS Duration: 74 QT Interval:  359 QTC Calculation: 401 R Axis:   25 Text Interpretation:  Sinus rhythm Consider left atrial enlargement Confirmed by Shaena Parkerson  MD, Gurnoor Sloop [id number]) on 09/17/2013 7:52:17 AM             Kalman Drape, MD 09/17/13 [phone]

## 2013-09-17 NOTE — ED Notes (Signed)
Pt arrived to the Ed with a complaint of syncope.  Pt states he has a sore throat, got up to go to the bathroom.  Pt was reaching for some medication and the next thing he realized his wife was helping him off the floor.  Pt states he felt a little unsteady on his feet when he woke up but has not rationale as to why this has occurred.

## 2013-09-18 DIAGNOSIS — J101 Influenza due to other identified influenza virus with other respiratory manifestations: Secondary | ICD-10-CM | POA: Diagnosis present

## 2013-09-18 DIAGNOSIS — R55 Syncope and collapse: Secondary | ICD-10-CM | POA: Diagnosis not present

## 2013-09-18 DIAGNOSIS — C9110 Chronic lymphocytic leukemia of B-cell type not having achieved remission: Secondary | ICD-10-CM

## 2013-09-18 DIAGNOSIS — J111 Influenza due to unidentified influenza virus with other respiratory manifestations: Secondary | ICD-10-CM

## 2013-09-18 LAB — BASIC METABOLIC PANEL
BUN: 13 mg/dL (ref 6–23)
CO2: 27 mEq/L (ref 19–32)
Calcium: 9 mg/dL (ref 8.4–10.5)
Chloride: 99 mEq/L (ref 96–112)
Creatinine, Ser: 1.04 mg/dL (ref 0.50–1.35)
GFR calc Af Amer: 82 mL/min — ABNORMAL LOW (ref >90)
GFR calc non Af Amer: 71 mL/min — ABNORMAL LOW (ref >90)
Glucose, Bld: 116 mg/dL — ABNORMAL HIGH (ref 70–99)
Potassium: 3.7 mEq/L (ref 3.5–5.1)
Sodium: 137 mEq/L (ref 135–145)

## 2013-09-18 LAB — GLUCOSE, CAPILLARY: Glucose-Capillary: 105 mg/dL — ABNORMAL HIGH (ref 70–99)

## 2013-09-18 LAB — URINE CULTURE
Colony Count: NO GROWTH
Culture: NO GROWTH

## 2013-09-18 LAB — CBC
HCT: 36.1 % — ABNORMAL LOW (ref 39.0–52.0)
Hemoglobin: 12.6 g/dL — ABNORMAL LOW (ref 13.0–17.0)
MCH: 30.1 pg (ref 26.0–34.0)
MCHC: 34.9 g/dL (ref 30.0–36.0)
MCV: 86.4 fL (ref 78.0–100.0)
Platelets: 96 10*3/uL — ABNORMAL LOW (ref 150–400)
RBC: 4.18 MIL/uL — ABNORMAL LOW (ref 4.22–5.81)
RDW: 12.8 % (ref 11.5–15.5)
WBC: 3 10*3/uL — ABNORMAL LOW (ref 4.0–10.5)

## 2013-09-18 MED ORDER — OSELTAMIVIR PHOSPHATE 30 MG PO CAPS: 30.0000 mg | ORAL_CAPSULE | Freq: Two times a day (BID) | ORAL | Status: DC

## 2013-09-18 NOTE — Progress Notes (Signed)
UR completed 

## 2013-09-18 NOTE — Discharge Summary (Signed)
[facility]  Physician Discharge Summary   Patient ID: [name] MRN: [id number] DOB/AGE: [birth year] 70 y.o.  Admit date: 09/17/2013 Discharge date: 09/18/2013  PCP: Vena Austria, MD  DISCHARGE DIAGNOSES:  Principal Problem:   Syncope Active Problems:   Chronic lymphocytic leukemia   Colon cancer   Hypertension   Hypercholesterolemia   Diabetes mellitus   Influenza A   RECOMMENDATIONS FOR OUTPATIENT FOLLOW UP: 1. Lisinopril has been held due to borderline low BP. Please discuss at follow up. 2. Being treated for Influenza. 3. Please consider checking TSH if one hasn't been checked recently. 4. Blood cultures are pending but patient is afebrile  DISCHARGE CONDITION: fair  Diet recommendation: Mod carb  Filed Weights   09/17/13 0935  Weight: 61 kg (134 lb 7.7 oz)    INITIAL HISTORY: The patient is a 72 y.o. year-old male with history of colorectal cancer, chronic lymphocytic leukemia, type 2 diabetes mellitus, hypertension, hypercholesterolemia, syncope related to bacteremia and viral illness many years ago who presented with syncope. 3 days ago he developed chest congestion with cough which was nonproductive, muscle aches and pains, low-grade fevers and chills. Around 5 AM on morning of admission he got up to use the restroom and to use sore throat spray when he had the syncopal episode.   Consultations:  None  Procedures: 2D ECHO 12/21 Study Conclusions - Left ventricle: The cavity size was normal. There was mild concentric hypertrophy. Systolic function was vigorous. The estimated ejection fraction was in the range of 65% to 70%. Wall motion was normal; there were no regional wall motion abnormalities. Doppler parameters are consistent with abnormal left ventricular relaxation (grade 1 diastolic dysfunction). - No significant valvular abnormalities  HOSPITAL COURSE:   Syncope This was likely vasovagal secondary to acute infection or  orthostasis as it happened after he got up suddenly. His tests have come back positive for Influenza. This could have caused these episodes. ECHO report reviewed. Telemetry shows occasional episodes of sinus tachycardia which is probably due to acute infection. Patient may see his PCP further and he will likely need to have a TSH level. He has been asked to keep himself well hydrated. He is much improved and wants to go home. He has been ambulating with no difficulty. Blood cultures are negative so far but we do have an explanation for his fevers at home. He has been afebrile in the hospital. Maxillary sinus abnormality noted on Ct probably related to influenza.  Influenza A Tamiflu adjusted for renal function  HTN His BP is low normal.  We will continue to hold lisinopril for now. He may discuss this further with his PCP.  HLD Stable, continue atorvastatin   Type 2 Diabetes Mellitus Resume home medications  History of CLL, s/p FCR x 6 cycles in 2013 with residual chronic leukopenia and thrombocytopenia  Platelet counts lower than usual but stable. Probably low from acute infection. He may follow up with his oncologist.  Patient is feeling much better overall. He has been ambulating with no difficulties. He wishes to go home. He is stable for discharge.   PERTINENT LABS:  The results of significant diagnostics from this hospitalization (including imaging, microbiology, ancillary and laboratory) are listed below for reference.    Microbiology: Recent Results (from the past 240 hour(s))  CULTURE, BLOOD (ROUTINE X 2)     Status: None   Collection Time    09/17/13  6:52 AM      Result Value Range Status   Specimen  Description BLOOD LEFT ANTECUBITAL   Final   Special Requests BOTTLES DRAWN AEROBIC AND ANAEROBIC 5CC   Final   Culture  Setup Time     Final   Value: 09/17/2013 14:31     Performed at [facility]   Culture     Final   Value:        BLOOD CULTURE RECEIVED NO GROWTH  TO DATE CULTURE WILL BE HELD FOR 5 DAYS BEFORE ISSUING A FINAL NEGATIVE REPORT     Performed at [facility]   Report Status PENDING   Incomplete  CULTURE, BLOOD (ROUTINE X 2)     Status: None   Collection Time    09/17/13  7:10 AM      Result Value Range Status   Specimen Description BLOOD LEFT WRIST   Final   Special Requests BOTTLES DRAWN AEROBIC AND ANAEROBIC 4CC   Final   Culture  Setup Time     Final   Value: 09/17/2013 14:31     Performed at [facility]   Culture     Final   Value:        BLOOD CULTURE RECEIVED NO GROWTH TO DATE CULTURE WILL BE HELD FOR 5 DAYS BEFORE ISSUING A FINAL NEGATIVE REPORT     Performed at [facility]   Report Status PENDING   Incomplete  RAPID STREP SCREEN     Status: None   Collection Time    09/17/13  9:12 AM      Result Value Range Status   Streptococcus, Group A Screen (Direct) NEGATIVE  NEGATIVE Final   Comment: (NOTE)     A Rapid Antigen test may result negative if the antigen level in the     sample is below the detection level of this test. The FDA has not     cleared this test as a stand-alone test therefore the rapid antigen     negative result has reflexed to a Group A Strep culture.     Labs: Basic Metabolic Panel:  Recent Labs Lab 09/17/13 0652 09/18/13 0501  NA 142 137  K 3.7 3.7  CL 104 99  CO2 27 27  GLUCOSE 137* 116*  BUN 19 13  CREATININE 1.20 1.04  CALCIUM 9.9 9.0   Liver Function Tests:  Recent Labs Lab 09/17/13 0652  AST 17  ALT 14  ALKPHOS 117  BILITOT 1.0  PROT 7.8  ALBUMIN 4.4   CBC:  Recent Labs Lab 09/17/13 0652 09/18/13 0501  WBC 4.0 3.0*  NEUTROABS 2.8  --   HGB 14.5 12.6*  HCT 41.3 36.1*  MCV 86.2 86.4  PLT 88* 96*   Cardiac Enzymes:  Recent Labs Lab 09/17/13 0652 09/17/13 0948 09/17/13 1558  TROPONINI <0.30 <0.30 <0.30   CBG:  Recent Labs Lab 09/17/13 1146 09/17/13 1615 09/17/13 2121 09/18/13 0743  GLUCAP 173* 199* 120* 105*     IMAGING  STUDIES Ct Head Wo Contrast  09/17/2013   CLINICAL DATA:  Syncope.  EXAM: CT HEAD WITHOUT CONTRAST  TECHNIQUE: Contiguous axial images were obtained from the base of the skull through the vertex without intravenous contrast.  COMPARISON:  None.  FINDINGS: Mild age related volume loss. No acute intracranial abnormality. Specifically, no hemorrhage, hydrocephalus, mass lesion, acute infarction, or significant intracranial injury. No acute calvarial abnormality.  Air-fluid level partially imaged in the right maxillary sinus, question acute sinusitis. Mastoid air cells and remainder of the paranasal sinuses are clear. Orbital soft tissues  unremarkable.  IMPRESSION: No acute intracranial abnormality.  Question acute right maxillary sinusitis.   Electronically Signed   By: Rolm Baptise M.D.   On: 09/17/2013 08:23   Dg Chest Portable 1 View  09/17/2013   CLINICAL DATA:  Loss of conscious  EXAM: PORTABLE CHEST - 1 VIEW  COMPARISON:  05/10/2013  FINDINGS: The heart size and mediastinal contours are within normal limits. Both lungs are clear. The visualized skeletal structures are unremarkable.  IMPRESSION: No active disease.   Electronically Signed   By: Jorje Guild M.D.   On: 09/17/2013 07:10    DISCHARGE EXAMINATION: Filed Vitals:   09/17/13 0935 09/17/13 1341 09/17/13 2119 09/18/13 0519  BP: 128/58 124/57 131/70 109/51  Pulse: 95 95 83 85  Temp: 98.6 F (37 C) 98.4 F (36.9 C) 98.4 F (36.9 C) 98.3 F (36.8 C)  TempSrc: Oral Oral Oral Oral  Resp: 18 18 18 20   Height: 5\' 7"  (1.702 m)     Weight: 61 kg (134 lb 7.7 oz)     SpO2: 100% 100% 100% 100%   General appearance: alert, cooperative, appears stated age and no distress Resp: clear to auscultation bilaterally Cardio: regular rate and rhythm, S1, S2 normal, no murmur, click, rub or gallop GI: soft, non-tender; bowel sounds normal; no masses,  no organomegaly Neurologic: Alert and oriented X 3, normal strength and tone. Normal symmetric  reflexes. Normal coordination and gait  DISPOSITION: Home with wife  Discharge Orders   Future Appointments Provider Department Dept Phone   01/16/2014 1:30 PM [place] [phone]   01/16/2014 2:00 PM Annia Belt, MD [place] [phone]   Future Orders Complete By Expires   Diet Carb Modified  As directed    Discharge instructions  As directed    Comments:     Keep yourself well hydrated. Avoid getting up suddenly from sitting or lying positions. Hold your lisinopril for now as your blood pressure is on the lower side. Talk to your doctor before resuming. Take frequent breaks when you go on your trip.   Increase activity slowly  As directed       ALLERGIES: No Known Allergies  Current Discharge Medication List    START taking these medications   Details  oseltamivir (TAMIFLU) 30 MG capsule Take 1 capsule (30 mg total) by mouth 2 (two) times daily. For 5 days Qty: 10 capsule, Refills: 0      CONTINUE these medications which have NOT CHANGED   Details  aspirin 81 MG tablet Take 81 mg by mouth every morning.     atorvastatin (LIPITOR) 40 MG tablet Take 40 mg by mouth every evening.     metFORMIN (GLUCOPHAGE) 500 MG tablet Take 500 mg by mouth 2 (two) times daily with a meal.   Associated Diagnoses: CLL (chronic lymphocytic leukemia); Colon cancer    zolpidem (AMBIEN) 5 MG tablet Take 1.5 mg by mouth at bedtime. Pt breaks tablet into thirds and takes 1/3 tablet nightly      STOP taking these medications     lisinopril (PRINIVIL,ZESTRIL) 5 MG tablet        Follow-up Information   Follow up with Vena Austria, MD. Schedule an appointment as soon as possible for a visit in 1 week. (discuss resumption of lisinopril)    Specialty:  Family Medicine   Contact information:   [address] [place] [address] [phone]  Follow up with  Annia Belt, MD In 1 week. (for low platelet counts)    Specialty:  Oncology   Contact information:   [address] [address] [phone]       TOTAL DISCHARGE TIME: 35 mins  [facility]  [facility] Pager [phone]  09/18/2013, 11:39 AM

## 2013-09-19 LAB — CULTURE, GROUP A STREP

## 2013-09-23 LAB — CULTURE, BLOOD (ROUTINE X 2)
Culture: NO GROWTH
Culture: NO GROWTH

## 2013-09-25 DIAGNOSIS — R55 Syncope and collapse: Secondary | ICD-10-CM | POA: Diagnosis not present

## 2013-10-19 DIAGNOSIS — I10 Essential (primary) hypertension: Secondary | ICD-10-CM | POA: Diagnosis not present

## 2013-10-19 DIAGNOSIS — E119 Type 2 diabetes mellitus without complications: Secondary | ICD-10-CM | POA: Diagnosis not present

## 2013-10-19 DIAGNOSIS — E785 Hyperlipidemia, unspecified: Secondary | ICD-10-CM | POA: Diagnosis not present

## 2013-11-25 ENCOUNTER — Telehealth: Payer: Self-pay | Admitting: *Deleted

## 2013-11-25 ENCOUNTER — Encounter: Payer: Self-pay | Admitting: *Deleted

## 2013-11-25 NOTE — Telephone Encounter (Signed)
Former pt of DR. G...td  

## 2014-01-16 ENCOUNTER — Other Ambulatory Visit: Payer: Medicare Other

## 2014-01-16 ENCOUNTER — Other Ambulatory Visit: Payer: Self-pay | Admitting: Oncology

## 2014-01-16 ENCOUNTER — Encounter: Payer: Self-pay | Admitting: Oncology

## 2014-01-16 ENCOUNTER — Ambulatory Visit (HOSPITAL_BASED_OUTPATIENT_CLINIC_OR_DEPARTMENT_OTHER): Payer: Medicare Other | Admitting: Oncology

## 2014-01-16 ENCOUNTER — Other Ambulatory Visit (HOSPITAL_BASED_OUTPATIENT_CLINIC_OR_DEPARTMENT_OTHER): Payer: Medicare Other

## 2014-01-16 ENCOUNTER — Ambulatory Visit: Payer: Medicare Other | Admitting: Oncology

## 2014-01-16 ENCOUNTER — Telehealth: Payer: Self-pay | Admitting: Oncology

## 2014-01-16 ENCOUNTER — Other Ambulatory Visit: Payer: Self-pay | Admitting: *Deleted

## 2014-01-16 VITALS — BP 151/61 | HR 101 | Temp 97.4°F | Resp 18 | Ht 67.0 in | Wt 146.9 lb

## 2014-01-16 DIAGNOSIS — C9110 Chronic lymphocytic leukemia of B-cell type not having achieved remission: Secondary | ICD-10-CM

## 2014-01-16 DIAGNOSIS — C189 Malignant neoplasm of colon, unspecified: Secondary | ICD-10-CM

## 2014-01-16 DIAGNOSIS — Z85038 Personal history of other malignant neoplasm of large intestine: Secondary | ICD-10-CM | POA: Diagnosis not present

## 2014-01-16 DIAGNOSIS — D696 Thrombocytopenia, unspecified: Secondary | ICD-10-CM | POA: Diagnosis not present

## 2014-01-16 LAB — CBC WITH DIFFERENTIAL/PLATELET
BASO%: 0.4 % (ref 0.0–2.0)
Basophils Absolute: 0 10*3/uL (ref 0.0–0.1)
EOS%: 3.7 % (ref 0.0–7.0)
Eosinophils Absolute: 0.1 10*3/uL (ref 0.0–0.5)
HCT: 41.2 % (ref 38.4–49.9)
HGB: 14.1 g/dL (ref 13.0–17.1)
LYMPH%: 33.9 % (ref 14.0–49.0)
MCH: 30.2 pg (ref 27.2–33.4)
MCHC: 34.1 g/dL (ref 32.0–36.0)
MCV: 88.6 fL (ref 79.3–98.0)
MONO#: 0.3 10*3/uL (ref 0.1–0.9)
MONO%: 8.8 % (ref 0.0–14.0)
NEUT#: 2.1 10*3/uL (ref 1.5–6.5)
NEUT%: 53.2 % (ref 39.0–75.0)
Platelets: 112 10*3/uL — ABNORMAL LOW (ref 140–400)
RBC: 4.65 10*6/uL (ref 4.20–5.82)
RDW: 13.1 % (ref 11.0–14.6)
WBC: 4 10*3/uL (ref 4.0–10.3)
lymph#: 1.3 10*3/uL (ref 0.9–3.3)

## 2014-01-16 LAB — COMPREHENSIVE METABOLIC PANEL (CC13)
ALT: 11 U/L (ref 0–55)
AST: 14 U/L (ref 5–34)
Albumin: 4.2 g/dL (ref 3.5–5.0)
Alkaline Phosphatase: 112 U/L (ref 40–150)
Anion Gap: 10 mEq/L (ref 3–11)
BUN: 17.5 mg/dL (ref 7.0–26.0)
CO2: 25 mEq/L (ref 22–29)
Calcium: 9.8 mg/dL (ref 8.4–10.4)
Chloride: 106 mEq/L (ref 98–109)
Creatinine: 1.2 mg/dL (ref 0.7–1.3)
Glucose: 201 mg/dl — ABNORMAL HIGH (ref 70–140)
Potassium: 4.1 mEq/L (ref 3.5–5.1)
Sodium: 141 mEq/L (ref 136–145)
Total Bilirubin: 1.26 mg/dL — ABNORMAL HIGH (ref 0.20–1.20)
Total Protein: 7 g/dL (ref 6.4–8.3)

## 2014-01-16 LAB — CEA: CEA: 0.8 ng/mL (ref 0.0–5.0)

## 2014-01-16 NOTE — Progress Notes (Signed)
Hematology and Oncology Follow Up Visit  [name] [id number] Feb 15, 1941 73 y.o. 01/16/2014 10:04 AM [name],[name], MDReade, [name], MD   Principle Diagnosis: 73 year old gentleman with the following diagnoses:  1. T2 N0 rectal cancer diagnosed in April of 2009. Status post low anterior resection on 01/09/2008. He was found to have a poorly differentiated adenocarcinoma without any lymph node involvement.  2. CLL diagnosed in August of 2010 presented with lymphocytosis and a bone marrow biopsy confirmed the presence of CLL.   Prior Therapy: He is status post FCR chemotherapy included on April of 2013. He has been in CR since that time.  Current therapy: Observation and surveillance.  Interim History: 73 year old gentleman is today for a followup visit. He is a pleasant gentleman with the above diagnoses. He was diagnosed and treated by Dr. Jamse Arn as mentioned above. Since his last visit, he has not had any new symptoms. He is not reporting any headaches blurred vision double vision. Has not reported any fevers or chills or sweats. As that report any abdominal pain or early satiety. Has not reported any hematochezia or melena. Has not reported any genitourinary complaints of frequency urgency or hesitancy. He has not reported any musculoskeletal complaints. Is not reporting any arthralgias or myalgias. He continue to be very active and with excellent performance status and quality of life.  Medications: I have reviewed the patient's current medications.  Current Outpatient Prescriptions  Medication Sig Dispense Refill  . aspirin 81 MG tablet Take 81 mg by mouth every morning.       Marland Kitchen atorvastatin (LIPITOR) 40 MG tablet Take 40 mg by mouth every evening.       . metFORMIN (GLUCOPHAGE) 500 MG tablet Take 500 mg by mouth 2 (two) times daily with a meal.      . oseltamivir (TAMIFLU) 30 MG capsule Take 1 capsule (30 mg total) by mouth 2 (two) times daily. For 5 days  10 capsule  0  .  valACYclovir (VALTREX) 500 MG tablet Take 500 mg by mouth daily.      Marland Kitchen zolpidem (AMBIEN) 5 MG tablet Take 1.5 mg by mouth at bedtime. Pt breaks tablet into thirds and takes 1/3 tablet nightly       No current facility-administered medications for this visit.     Allergies: No Known Allergies  Past Medical History, Surgical history, Social history, and Family History were reviewed and updated.  Review of Systems: Constitutional:  Negative for fever, chills, night sweats, anorexia, weight loss, pain. Cardiovascular: no chest pain or dyspnea on exertion Respiratory: no cough, shortness of breath, or wheezing Neurological: no TIA or stroke symptoms Dermatological: negative ENT: negative Skin: Negative. Gastrointestinal: no abdominal pain, change in bowel habits, or black or bloody stools Genito-Urinary: no dysuria, trouble voiding, or hematuria Hematological and Lymphatic: negative Breast: negative Musculoskeletal: negative Remaining ROS negative. Physical Exam: Blood pressure 151/61, pulse 101, temperature 97.4 F (36.3 C), temperature source Oral, resp. rate 18, height 5' 7"  (1.702 m), weight 146 lb 14.4 oz (66.633 kg). ECOG: 0 General appearance: alert and cooperative appeared younger spent stated age. Head: Normocephalic, without obvious abnormality, atraumatic Neck: no adenopathy, no carotid bruit, no JVD, supple, symmetrical, trachea midline and thyroid not enlarged, symmetric, no tenderness/mass/nodules Lymph nodes: Cervical, supraclavicular, and axillary nodes normal. Heart:regular rate and rhythm, S1, S2 normal, no murmur, click, rub or gallop Lung:chest clear, no wheezing, rales, normal symmetric air entry Abdomin: soft, non-tender, without masses or organomegaly EXT:no erythema, induration, or nodules   Lab  Results: Lab Results  Component Value Date   WBC 4.0 01/16/2014   HGB 14.1 01/16/2014   HCT 41.2 01/16/2014   MCV 88.6 01/16/2014   PLT 112* 01/16/2014      Chemistry      Component Value Date/Time   NA 137 09/18/2013 0501   NA 143 07/18/2013 1358   NA 143 02/15/2012 1404   K 3.7 09/18/2013 0501   K 4.5 07/18/2013 1358   K 4.4 02/15/2012 1404   CL 99 09/18/2013 0501   CL 104 01/10/2013 1128   CL 99 02/15/2012 1404   CO2 27 09/18/2013 0501   CO2 27 07/18/2013 1358   CO2 28 02/15/2012 1404   BUN 13 09/18/2013 0501   BUN 12.7 07/18/2013 1358   BUN 14 02/15/2012 1404   CREATININE 1.04 09/18/2013 0501   CREATININE 0.9 07/18/2013 1358   CREATININE 1.0 02/15/2012 1404      Component Value Date/Time   CALCIUM 9.0 09/18/2013 0501   CALCIUM 10.1 07/18/2013 1358   CALCIUM 9.2 02/15/2012 1404   ALKPHOS 117 09/17/2013 0652   ALKPHOS 105 07/18/2013 1358   ALKPHOS 99* 02/15/2012 1404   AST 17 09/17/2013 0652   AST 17 07/18/2013 1358   AST 20 02/15/2012 1404   ALT 14 09/17/2013 0652   ALT 13 07/18/2013 1358   ALT 19 02/15/2012 1404   BILITOT 1.0 09/17/2013 0652   BILITOT 0.78 07/18/2013 1358   BILITOT 1.30 02/15/2012 1404       Impression and Plan:  73 year old gentleman with the following issues:  1.Stage III chronic lymphocytic leukemia. He presented with nonbulky lymphadenopathy at diagnosis. Mild splenomegaly. He had a excellent response to FCR and his current blood counts showed no evidence of lymphocytosis at this time. We'll continue active 7 onset followup in 6 months time. He develops symptomatic lymphadenopathy a rapidly rising lymphocytosis we will consider salvage therapy.  2. 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 Status post surgical resection in April 2009 without any evidence of recurrence. It is unlikely that he will have a relapse from this disease and he is up to date on his screening colonoscopies.   3. Thrombocytopenia: counts are actually improved today than previous testing. Could be related to his CLL or CLL treatment. We'll continue to monitor this.   Wyatt Portela, MD 4/21/201510:04 AM

## 2014-01-16 NOTE — Telephone Encounter (Signed)
Gave pt appt for lab and MD on October 2015

## 2014-02-20 ENCOUNTER — Other Ambulatory Visit: Payer: Self-pay | Admitting: *Deleted

## 2014-02-20 DIAGNOSIS — C9110 Chronic lymphocytic leukemia of B-cell type not having achieved remission: Secondary | ICD-10-CM

## 2014-02-20 MED ORDER — VALACYCLOVIR HCL 500 MG PO TABS: 500.0000 mg | ORAL_TABLET | Freq: Every day | ORAL | Status: DC

## 2014-02-20 NOTE — Telephone Encounter (Signed)
Refill request sent to MD for approval.

## 2014-02-20 NOTE — Addendum Note (Signed)
Addended by: Tania Ade on: 02/20/2014 03:46 PM   Modules accepted: Orders

## 2014-04-19 DIAGNOSIS — G479 Sleep disorder, unspecified: Secondary | ICD-10-CM | POA: Diagnosis not present

## 2014-04-19 DIAGNOSIS — Z85038 Personal history of other malignant neoplasm of large intestine: Secondary | ICD-10-CM | POA: Diagnosis not present

## 2014-04-19 DIAGNOSIS — Z125 Encounter for screening for malignant neoplasm of prostate: Secondary | ICD-10-CM | POA: Diagnosis not present

## 2014-04-19 DIAGNOSIS — Z1331 Encounter for screening for depression: Secondary | ICD-10-CM | POA: Diagnosis not present

## 2014-04-19 DIAGNOSIS — C9112 Chronic lymphocytic leukemia of B-cell type in relapse: Secondary | ICD-10-CM | POA: Diagnosis not present

## 2014-04-19 DIAGNOSIS — I10 Essential (primary) hypertension: Secondary | ICD-10-CM | POA: Diagnosis not present

## 2014-04-19 DIAGNOSIS — E119 Type 2 diabetes mellitus without complications: Secondary | ICD-10-CM | POA: Diagnosis not present

## 2014-04-19 DIAGNOSIS — E785 Hyperlipidemia, unspecified: Secondary | ICD-10-CM | POA: Diagnosis not present

## 2014-04-19 DIAGNOSIS — Z23 Encounter for immunization: Secondary | ICD-10-CM | POA: Diagnosis not present

## 2014-04-19 DIAGNOSIS — Z0000 Encounter for general adult medical examination without abnormal findings: Secondary | ICD-10-CM | POA: Diagnosis not present

## 2014-05-26 DIAGNOSIS — J358 Other chronic diseases of tonsils and adenoids: Secondary | ICD-10-CM | POA: Diagnosis not present

## 2014-06-11 ENCOUNTER — Other Ambulatory Visit: Payer: Self-pay | Admitting: *Deleted

## 2014-06-11 DIAGNOSIS — C9110 Chronic lymphocytic leukemia of B-cell type not having achieved remission: Secondary | ICD-10-CM

## 2014-06-11 MED ORDER — VALACYCLOVIR HCL 500 MG PO TABS: 500.0000 mg | ORAL_TABLET | Freq: Every day | ORAL | Status: DC

## 2014-07-12 DIAGNOSIS — Z23 Encounter for immunization: Secondary | ICD-10-CM | POA: Diagnosis not present

## 2014-07-18 ENCOUNTER — Ambulatory Visit (HOSPITAL_BASED_OUTPATIENT_CLINIC_OR_DEPARTMENT_OTHER): Payer: Medicare Other | Admitting: Oncology

## 2014-07-18 ENCOUNTER — Telehealth: Payer: Self-pay | Admitting: Oncology

## 2014-07-18 ENCOUNTER — Other Ambulatory Visit (HOSPITAL_BASED_OUTPATIENT_CLINIC_OR_DEPARTMENT_OTHER): Payer: Medicare Other

## 2014-07-18 VITALS — BP 145/64 | HR 88 | Temp 98.0°F | Resp 18 | Ht 67.0 in | Wt 145.5 lb

## 2014-07-18 DIAGNOSIS — Z8579 Personal history of other malignant neoplasms of lymphoid, hematopoietic and related tissues: Secondary | ICD-10-CM

## 2014-07-18 DIAGNOSIS — C9110 Chronic lymphocytic leukemia of B-cell type not having achieved remission: Secondary | ICD-10-CM

## 2014-07-18 DIAGNOSIS — D6959 Other secondary thrombocytopenia: Secondary | ICD-10-CM | POA: Diagnosis not present

## 2014-07-18 DIAGNOSIS — Z85038 Personal history of other malignant neoplasm of large intestine: Secondary | ICD-10-CM

## 2014-07-18 DIAGNOSIS — R1310 Dysphagia, unspecified: Secondary | ICD-10-CM

## 2014-07-18 DIAGNOSIS — C189 Malignant neoplasm of colon, unspecified: Secondary | ICD-10-CM

## 2014-07-18 LAB — COMPREHENSIVE METABOLIC PANEL (CC13)
ALT: 17 U/L (ref 0–55)
AST: 13 U/L (ref 5–34)
Albumin: 4.1 g/dL (ref 3.5–5.0)
Alkaline Phosphatase: 117 U/L (ref 40–150)
Anion Gap: 9 mEq/L (ref 3–11)
BUN: 15.5 mg/dL (ref 7.0–26.0)
CO2: 28 mEq/L (ref 22–29)
Calcium: 10.3 mg/dL (ref 8.4–10.4)
Chloride: 105 mEq/L (ref 98–109)
Creatinine: 1.2 mg/dL (ref 0.7–1.3)
Glucose: 189 mg/dl — ABNORMAL HIGH (ref 70–140)
Potassium: 4.1 mEq/L (ref 3.5–5.1)
Sodium: 142 mEq/L (ref 136–145)
Total Bilirubin: 1.34 mg/dL — ABNORMAL HIGH (ref 0.20–1.20)
Total Protein: 7.2 g/dL (ref 6.4–8.3)

## 2014-07-18 LAB — CBC WITH DIFFERENTIAL/PLATELET
BASO%: 0.5 % (ref 0.0–2.0)
Basophils Absolute: 0 10*3/uL (ref 0.0–0.1)
EOS%: 5.3 % (ref 0.0–7.0)
Eosinophils Absolute: 0.2 10*3/uL (ref 0.0–0.5)
HCT: 38.8 % (ref 38.4–49.9)
HGB: 13.7 g/dL (ref 13.0–17.1)
LYMPH%: 33.7 % (ref 14.0–49.0)
MCH: 30.4 pg (ref 27.2–33.4)
MCHC: 35.3 g/dL (ref 32.0–36.0)
MCV: 86 fL (ref 79.3–98.0)
MONO#: 0.3 10*3/uL (ref 0.1–0.9)
MONO%: 6.9 % (ref 0.0–14.0)
NEUT#: 2.3 10*3/uL (ref 1.5–6.5)
NEUT%: 53.6 % (ref 39.0–75.0)
Platelets: 125 10*3/uL — ABNORMAL LOW (ref 140–400)
RBC: 4.51 10*6/uL (ref 4.20–5.82)
RDW: 12.7 % (ref 11.0–14.6)
WBC: 4.4 10*3/uL (ref 4.0–10.3)
lymph#: 1.5 10*3/uL (ref 0.9–3.3)

## 2014-07-18 NOTE — Telephone Encounter (Signed)
gv and printed appt sched and avs for pt for NOV, Jan 2016 and April 2016...Marland KitchenMarland KitchenPt sched with Dr. Oletta Lamas on 11.16 at 3pm

## 2014-07-18 NOTE — Progress Notes (Signed)
Hematology and Oncology Follow Up Visit  [name] [id number] 1941-08-15 73 y.o. 07/18/2014 9:53 AM [name], [name], MD   Principle Diagnosis: 73 year old gentleman with the following diagnoses:  1. T2 N0 rectal cancer diagnosed in April of 2009. Status post low anterior resection on 01/09/2008. He was found to have a poorly differentiated adenocarcinoma without any lymph node involvement.  2. CLL diagnosed in August of 2010 presented with lymphocytosis and a bone marrow biopsy confirmed the presence of CLL.   Prior Therapy: He is status post FCR chemotherapy included on April of 2013. He has been in CR since that time.  Current therapy: Observation and surveillance.  Interim History: [name] presents today for a followup visit.Since his last visit, he has been doing very well regarding his cancer history. He did report symptoms of dysphagia and food getting stuck in his throat at times. Despite that, he is able to eat and drink and maintain his weight. He was evaluated at urgent care and was able to remove a food item. He is not reporting any headaches blurred vision double vision. Has not reported any fevers or chills or sweats. As that report any abdominal pain or early satiety. Has not reported any hematochezia or melena. Has not reported any genitourinary complaints of frequency urgency or hesitancy. He has not reported any musculoskeletal complaints. Is not reporting any arthralgias or myalgias. He continue to be very active and with excellent performance status and quality of life. Rest of his review of systems unremarkable.  Medications: I have reviewed the patient's current medications.  Current Outpatient Prescriptions  Medication Sig Dispense Refill  . aspirin 81 MG tablet Take 81 mg by mouth every morning.       Marland Kitchen atorvastatin (LIPITOR) 40 MG tablet Take 40 mg by mouth every evening.       . metFORMIN (GLUCOPHAGE) 500 MG tablet Take 500 mg by mouth  2 (two) times daily with a meal.      . oseltamivir (TAMIFLU) 30 MG capsule Take 1 capsule (30 mg total) by mouth 2 (two) times daily. For 5 days  10 capsule  0  . valACYclovir (VALTREX) 500 MG tablet Take 1 tablet (500 mg total) by mouth daily.  30 tablet  1  . zolpidem (AMBIEN) 5 MG tablet Take 1.5 mg by mouth at bedtime. Pt breaks tablet into thirds and takes 1/3 tablet nightly       No current facility-administered medications for this visit.     Allergies: No Known Allergies  Past Medical History, Surgical history, Social history, and Family History were reviewed and updated.  Physical Exam: Blood pressure 145/64, pulse 88, temperature 98 F (36.7 C), temperature source Oral, resp. rate 18, height 5' 7" (1.702 m), weight 145 lb 8 oz (65.998 kg). ECOG: 0 General appearance: alert and cooperative  Head: Normocephalic, without obvious abnormality, atraumatic Neck: no adenopathy. No masses or lesions or discoloration. Lymph nodes: Cervical, supraclavicular, and axillary nodes normal. Heart:regular rate and rhythm, S1, S2 normal, no murmur, click, rub or gallop Lung:chest clear, no wheezing, rales, normal symmetric air entry Abdomin: soft, non-tender, without masses or organomegaly EXT:no erythema, induration, or nodules   Lab Results: Lab Results  Component Value Date   WBC 4.4 07/18/2014   HGB 13.7 07/18/2014   HCT 38.8 07/18/2014   MCV 86.0 07/18/2014   PLT 125* 07/18/2014     Chemistry      Component Value Date/Time   NA 141 01/16/2014 0936  NA 137 09/18/2013 0501   NA 143 02/15/2012 1404   K 4.1 01/16/2014 0936   K 3.7 09/18/2013 0501   K 4.4 02/15/2012 1404   CL 99 09/18/2013 0501   CL 104 01/10/2013 1128   CL 99 02/15/2012 1404   CO2 25 01/16/2014 0936   CO2 27 09/18/2013 0501   CO2 28 02/15/2012 1404   BUN 17.5 01/16/2014 0936   BUN 13 09/18/2013 0501   BUN 14 02/15/2012 1404   CREATININE 1.2 01/16/2014 0936   CREATININE 1.04 09/18/2013 0501   CREATININE 1.0  02/15/2012 1404      Component Value Date/Time   CALCIUM 9.8 01/16/2014 0936   CALCIUM 9.0 09/18/2013 0501   CALCIUM 9.2 02/15/2012 1404   ALKPHOS 112 01/16/2014 0936   ALKPHOS 117 09/17/2013 0652   ALKPHOS 99* 02/15/2012 1404   AST 14 01/16/2014 0936   AST 17 09/17/2013 0652   AST 20 02/15/2012 1404   ALT 11 01/16/2014 0936   ALT 14 09/17/2013 0652   ALT 19 02/15/2012 1404   BILITOT 1.26* 01/16/2014 0936   BILITOT 1.0 09/17/2013 0652   BILITOT 1.30 02/15/2012 1404       Impression and Plan:  73-year-old gentleman with the following issues:  1.Stage III chronic lymphocytic leukemia. He presented with nonbulky lymphadenopathy at diagnosis. Mild splenomegaly. He had a excellent response to FCR and his current blood counts showed no evidence of lymphocytosis at this time. He has no clinical signs or symptoms of disease progression and will continue an active surveillance. I will repeat imaging studies as needed.  2. 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 Status post surgical resection in April 2009 without any evidence of recurrence. It is unlikely that he will have a relapse from this disease and he is up to date on his screening colonoscopies.   3. Thrombocytopenia: counts continue to improve and close to normal at this time. His thrombocytopenia is related to CLL and previous treatment.  4. Dysphagia: Unclear etiology could represent esophageal stricture among other conditions. I will refer him back to gastroenterology at [facility] for an evaluation.  5. Followup: Will be in 6 months.   SHADAD,FIRAS, MD 10/21/20159:53 AM 

## 2014-07-19 ENCOUNTER — Telehealth: Payer: Self-pay | Admitting: Oncology

## 2014-07-19 NOTE — Telephone Encounter (Signed)
Faxed pt medical records to [facility] °

## 2014-08-07 ENCOUNTER — Other Ambulatory Visit: Payer: Self-pay | Admitting: *Deleted

## 2014-08-07 NOTE — Telephone Encounter (Signed)
Received refill request for Valacyclovir 500 mg. Reviewed with Dr. Creola Corn longer needs this medication. Far enough past treatment. Refaxed to pharmacy with note to D/C.

## 2014-08-08 ENCOUNTER — Telehealth: Payer: Self-pay | Admitting: Medical Oncology

## 2014-08-08 NOTE — Telephone Encounter (Signed)
Patient called asking whether he is to continue taking Valtrex. Informed patient will review with MD and call patient back if he is to continue with it. Patient expressed understanding. No further questions.  LOV 10/21 F/U 12/2014

## 2014-08-08 NOTE — Telephone Encounter (Signed)
He needs to stop Valtrex.

## 2014-08-13 ENCOUNTER — Other Ambulatory Visit: Payer: Self-pay | Admitting: Gastroenterology

## 2014-08-13 DIAGNOSIS — R4702 Dysphasia: Secondary | ICD-10-CM

## 2014-08-13 DIAGNOSIS — R1310 Dysphagia, unspecified: Secondary | ICD-10-CM | POA: Diagnosis not present

## 2014-08-15 ENCOUNTER — Ambulatory Visit
Admission: RE | Admit: 2014-08-15 | Discharge: 2014-08-15 | Disposition: A | Discharge status: Home / Self Care | Payer: Medicare Other | Source: Ambulatory Visit | Attending: Gastroenterology | Admitting: Gastroenterology

## 2014-08-15 DIAGNOSIS — R4702 Dysphasia: Secondary | ICD-10-CM

## 2014-08-15 DIAGNOSIS — K449 Diaphragmatic hernia without obstruction or gangrene: Secondary | ICD-10-CM | POA: Diagnosis not present

## 2014-08-15 DIAGNOSIS — K219 Gastro-esophageal reflux disease without esophagitis: Secondary | ICD-10-CM | POA: Diagnosis not present

## 2014-09-06 DIAGNOSIS — R1310 Dysphagia, unspecified: Secondary | ICD-10-CM | POA: Diagnosis not present

## 2014-09-06 DIAGNOSIS — K219 Gastro-esophageal reflux disease without esophagitis: Secondary | ICD-10-CM | POA: Diagnosis not present

## 2014-09-15 ENCOUNTER — Other Ambulatory Visit: Payer: Self-pay | Admitting: Nurse Practitioner

## 2014-10-08 DIAGNOSIS — H04123 Dry eye syndrome of bilateral lacrimal glands: Secondary | ICD-10-CM | POA: Diagnosis not present

## 2014-10-08 DIAGNOSIS — H40013 Open angle with borderline findings, low risk, bilateral: Secondary | ICD-10-CM | POA: Diagnosis not present

## 2014-10-17 ENCOUNTER — Other Ambulatory Visit (HOSPITAL_BASED_OUTPATIENT_CLINIC_OR_DEPARTMENT_OTHER): Payer: Medicare Other

## 2014-10-17 DIAGNOSIS — C189 Malignant neoplasm of colon, unspecified: Secondary | ICD-10-CM

## 2014-10-17 DIAGNOSIS — C9110 Chronic lymphocytic leukemia of B-cell type not having achieved remission: Secondary | ICD-10-CM | POA: Diagnosis not present

## 2014-10-17 DIAGNOSIS — R1310 Dysphagia, unspecified: Secondary | ICD-10-CM

## 2014-10-17 DIAGNOSIS — Z85038 Personal history of other malignant neoplasm of large intestine: Secondary | ICD-10-CM | POA: Diagnosis not present

## 2014-10-17 LAB — CBC WITH DIFFERENTIAL/PLATELET
BASO%: 0.5 % (ref 0.0–2.0)
Basophils Absolute: 0 10*3/uL (ref 0.0–0.1)
EOS%: 4.1 % (ref 0.0–7.0)
Eosinophils Absolute: 0.2 10*3/uL (ref 0.0–0.5)
HCT: 41.9 % (ref 38.4–49.9)
HGB: 13.8 g/dL (ref 13.0–17.1)
LYMPH%: 29.9 % (ref 14.0–49.0)
MCH: 28.9 pg (ref 27.2–33.4)
MCHC: 32.9 g/dL (ref 32.0–36.0)
MCV: 87.9 fL (ref 79.3–98.0)
MONO#: 0.4 10*3/uL (ref 0.1–0.9)
MONO%: 8.3 % (ref 0.0–14.0)
NEUT#: 2.9 10*3/uL (ref 1.5–6.5)
NEUT%: 57.2 % (ref 39.0–75.0)
Platelets: 132 10*3/uL — ABNORMAL LOW (ref 140–400)
RBC: 4.77 10*6/uL (ref 4.20–5.82)
RDW: 12.9 % (ref 11.0–14.6)
WBC: 5 10*3/uL (ref 4.0–10.3)
lymph#: 1.5 10*3/uL (ref 0.9–3.3)

## 2014-10-17 LAB — COMPREHENSIVE METABOLIC PANEL (CC13)
ALT: 20 U/L (ref 0–55)
AST: 18 U/L (ref 5–34)
Albumin: 4.4 g/dL (ref 3.5–5.0)
Alkaline Phosphatase: 120 U/L (ref 40–150)
Anion Gap: 8 mEq/L (ref 3–11)
BUN: 14.2 mg/dL (ref 7.0–26.0)
CO2: 28 mEq/L (ref 22–29)
Calcium: 9.6 mg/dL (ref 8.4–10.4)
Chloride: 104 mEq/L (ref 98–109)
Creatinine: 1.2 mg/dL (ref 0.7–1.3)
EGFR: 73 mL/min/{1.73_m2} — ABNORMAL LOW (ref >90)
Glucose: 177 mg/dl — ABNORMAL HIGH (ref 70–140)
Potassium: 4.4 mEq/L (ref 3.5–5.1)
Sodium: 140 mEq/L (ref 136–145)
Total Bilirubin: 1.15 mg/dL (ref 0.20–1.20)
Total Protein: 7.2 g/dL (ref 6.4–8.3)

## 2014-11-07 ENCOUNTER — Telehealth: Payer: Self-pay | Admitting: Oncology

## 2014-11-07 NOTE — Telephone Encounter (Signed)
S/w pt confirming labs/ov r/s from pm to am same day per MD.... KJ

## 2014-11-11 IMAGING — CR DG CHEST 1V PORT
1 series · 1 of 1 positions shown · non-contrast
Comparison: 05/10/2013

CLINICAL DATA: Loss of conscious

EXAM:
PORTABLE CHEST - 1 VIEW

[AP]
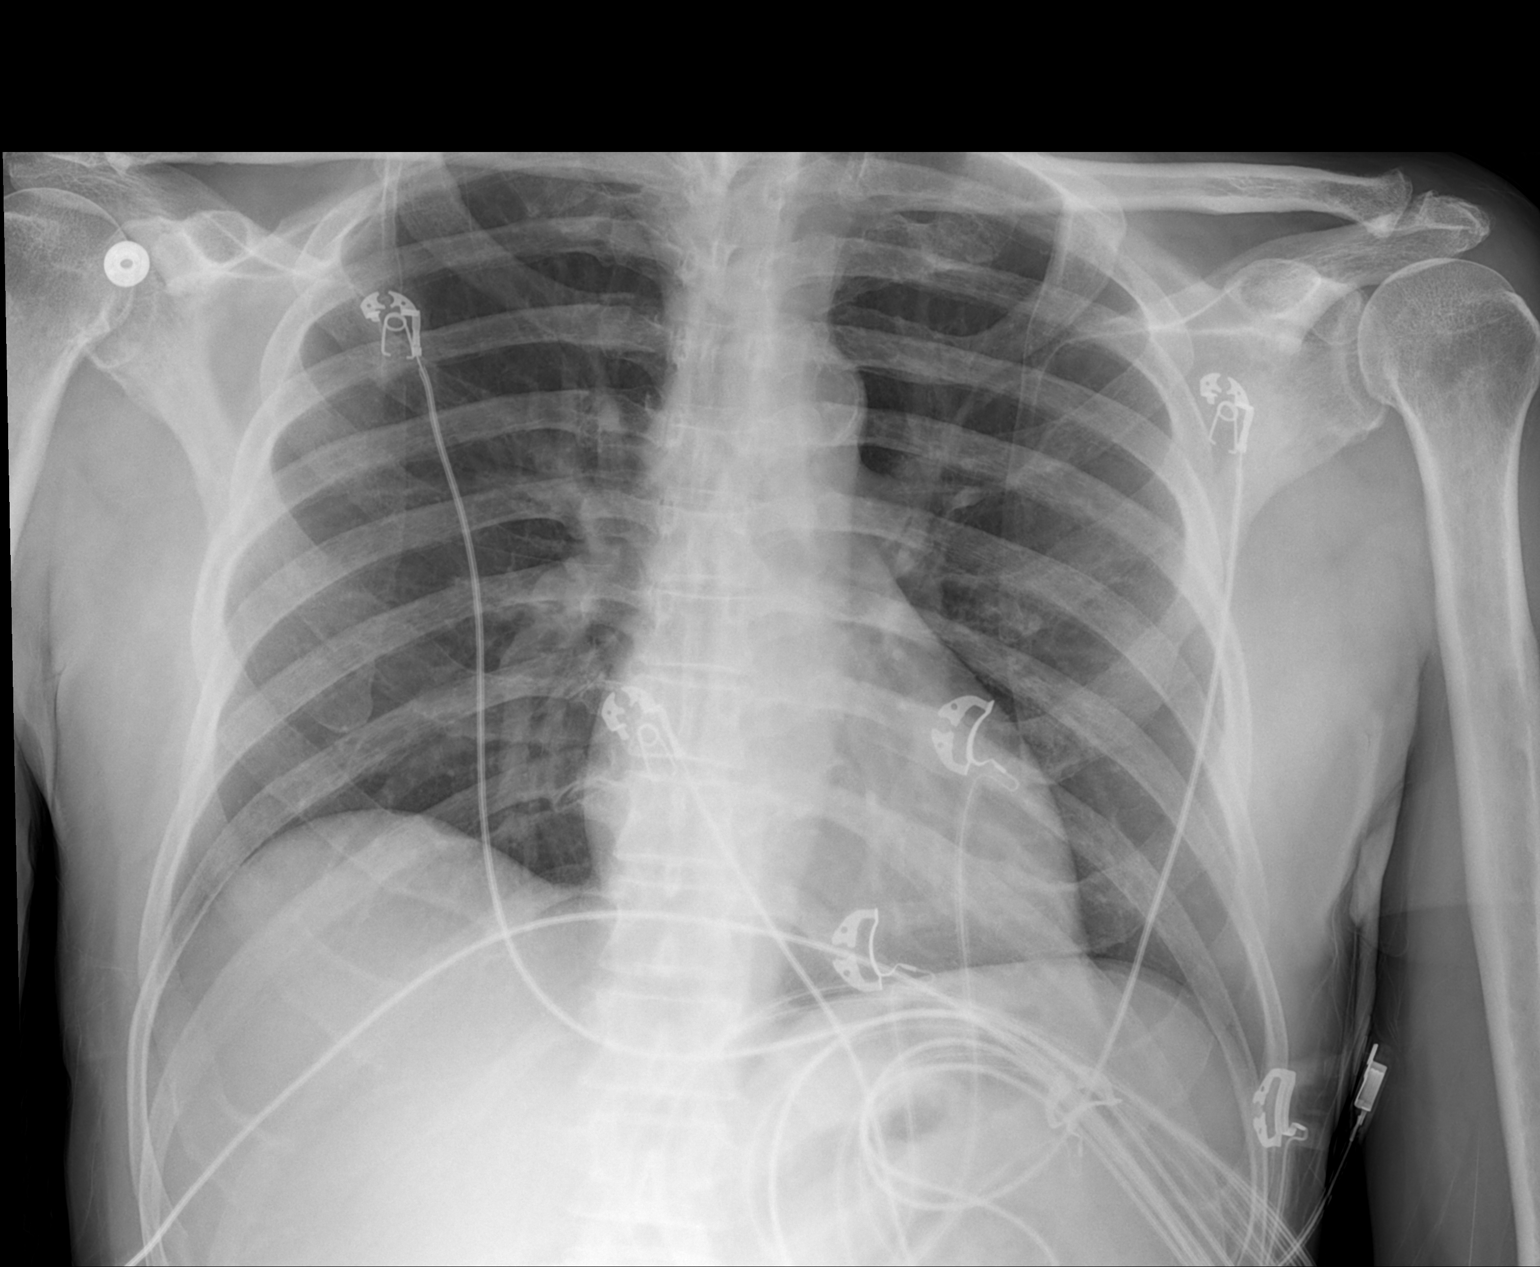

[1 of 1 positions shown; findings below may reference images not displayed]

FINDINGS: The heart size and mediastinal contours are within normal limits.
Both lungs are clear. The visualized skeletal structures are
unremarkable.
IMPRESSION: No active disease.

## 2014-12-06 DIAGNOSIS — E780 Pure hypercholesterolemia: Secondary | ICD-10-CM | POA: Diagnosis not present

## 2014-12-06 DIAGNOSIS — D696 Thrombocytopenia, unspecified: Secondary | ICD-10-CM | POA: Diagnosis not present

## 2014-12-06 DIAGNOSIS — E119 Type 2 diabetes mellitus without complications: Secondary | ICD-10-CM | POA: Diagnosis not present

## 2014-12-06 DIAGNOSIS — I10 Essential (primary) hypertension: Secondary | ICD-10-CM | POA: Diagnosis not present

## 2014-12-06 DIAGNOSIS — G4700 Insomnia, unspecified: Secondary | ICD-10-CM | POA: Diagnosis not present

## 2014-12-12 DIAGNOSIS — H40013 Open angle with borderline findings, low risk, bilateral: Secondary | ICD-10-CM | POA: Diagnosis not present

## 2015-01-07 DIAGNOSIS — I10 Essential (primary) hypertension: Secondary | ICD-10-CM | POA: Diagnosis not present

## 2015-01-16 ENCOUNTER — Other Ambulatory Visit: Payer: Self-pay | Admitting: Oncology

## 2015-01-16 DIAGNOSIS — C9110 Chronic lymphocytic leukemia of B-cell type not having achieved remission: Secondary | ICD-10-CM

## 2015-01-17 ENCOUNTER — Other Ambulatory Visit: Payer: Medicare Other

## 2015-01-17 ENCOUNTER — Other Ambulatory Visit (HOSPITAL_BASED_OUTPATIENT_CLINIC_OR_DEPARTMENT_OTHER): Payer: Medicare Other

## 2015-01-17 ENCOUNTER — Telehealth: Payer: Self-pay | Admitting: Oncology

## 2015-01-17 ENCOUNTER — Ambulatory Visit: Payer: Medicare Other | Admitting: Oncology

## 2015-01-17 ENCOUNTER — Ambulatory Visit (HOSPITAL_BASED_OUTPATIENT_CLINIC_OR_DEPARTMENT_OTHER): Payer: Medicare Other | Admitting: Oncology

## 2015-01-17 VITALS — BP 148/76 | HR 84 | Temp 98.2°F | Resp 18 | Ht 67.0 in | Wt 149.5 lb

## 2015-01-17 DIAGNOSIS — C9110 Chronic lymphocytic leukemia of B-cell type not having achieved remission: Secondary | ICD-10-CM

## 2015-01-17 DIAGNOSIS — Z8579 Personal history of other malignant neoplasms of lymphoid, hematopoietic and related tissues: Secondary | ICD-10-CM

## 2015-01-17 DIAGNOSIS — Z85038 Personal history of other malignant neoplasm of large intestine: Secondary | ICD-10-CM

## 2015-01-17 DIAGNOSIS — D6959 Other secondary thrombocytopenia: Secondary | ICD-10-CM

## 2015-01-17 LAB — COMPREHENSIVE METABOLIC PANEL (CC13)
ALT: 15 U/L (ref 0–55)
AST: 14 U/L (ref 5–34)
Albumin: 4.3 g/dL (ref 3.5–5.0)
Alkaline Phosphatase: 114 U/L (ref 40–150)
Anion Gap: 13 mEq/L — ABNORMAL HIGH (ref 3–11)
BUN: 15.1 mg/dL (ref 7.0–26.0)
CO2: 22 mEq/L (ref 22–29)
Calcium: 9.3 mg/dL (ref 8.4–10.4)
Chloride: 107 mEq/L (ref 98–109)
Creatinine: 1.1 mg/dL (ref 0.7–1.3)
EGFR: 80 mL/min/{1.73_m2} — ABNORMAL LOW (ref >90)
Glucose: 133 mg/dl (ref 70–140)
Potassium: 4.3 mEq/L (ref 3.5–5.1)
Sodium: 142 mEq/L (ref 136–145)
Total Bilirubin: 1.25 mg/dL — ABNORMAL HIGH (ref 0.20–1.20)
Total Protein: 7.2 g/dL (ref 6.4–8.3)

## 2015-01-17 LAB — CBC WITH DIFFERENTIAL/PLATELET
BASO%: 0.7 % (ref 0.0–2.0)
Basophils Absolute: 0 10*3/uL (ref 0.0–0.1)
EOS%: 4.8 % (ref 0.0–7.0)
Eosinophils Absolute: 0.2 10*3/uL (ref 0.0–0.5)
HCT: 39.5 % (ref 38.4–49.9)
HGB: 14 g/dL (ref 13.0–17.1)
LYMPH%: 35.8 % (ref 14.0–49.0)
MCH: 29.8 pg (ref 27.2–33.4)
MCHC: 35.4 g/dL (ref 32.0–36.0)
MCV: 84 fL (ref 79.3–98.0)
MONO#: 0.4 10*3/uL (ref 0.1–0.9)
MONO%: 9.2 % (ref 0.0–14.0)
NEUT#: 2.2 10*3/uL (ref 1.5–6.5)
NEUT%: 49.5 % (ref 39.0–75.0)
Platelets: 122 10*3/uL — ABNORMAL LOW (ref 140–400)
RBC: 4.7 10*6/uL (ref 4.20–5.82)
RDW: 12.8 % (ref 11.0–14.6)
WBC: 4.4 10*3/uL (ref 4.0–10.3)
lymph#: 1.6 10*3/uL (ref 0.9–3.3)

## 2015-01-17 NOTE — Telephone Encounter (Signed)
Gave avs & calendar for October. °

## 2015-01-17 NOTE — Progress Notes (Signed)
Hematology and Oncology Follow Up Visit  [name] [id number] 03/04/1941 74 y.o. 01/17/2015 9:55 AM EdwardEdward ALEXANDER, MDReade, Robert, MD   Principle Diagnosis: 74-year-old gentleman with the following diagnoses:  1. T2 N0 rectal cancer diagnosed in April of 2009. Status post low anterior resection on 01/09/2008. He was found to have a poorly differentiated adenocarcinoma without any lymph node involvement.  2. CLL diagnosed in August of 2010 presented with lymphocytosis and a bone marrow biopsy confirmed the presence of CLL.   Prior Therapy: He is status post FCR chemotherapy included on April of 2013. He has been in CR since that time.  Current therapy: Observation and surveillance.  Interim History: [name] presents today for a followup visit with his wife.Since his last visit, he reports no complaints. He is able to eat and maintain his weight better since the last visit. He has not reported any lymphadenopathy or constitutional symptoms. He did not report any petechiae or easy bruisability. He does not report any change in his bowel habits. He is not reporting any headaches blurred vision double vision. Has not reported any fevers or chills or sweats. As that report any abdominal pain or early satiety. Has not reported any hematochezia or melena. Has not reported any genitourinary complaints of frequency urgency or hesitancy. He has not reported any musculoskeletal complaints. Is not reporting any arthralgias or myalgias. He continue to be very active and with excellent performance status and quality of life. Rest of his review of systems unremarkable.  Medications: I have reviewed the patient's current medications.  Current Outpatient Prescriptions  Medication Sig Dispense Refill  . aspirin 81 MG tablet Take 81 mg by mouth every morning.     . atorvastatin (LIPITOR) 40 MG tablet Take 40 mg by mouth every evening.     . metFORMIN (GLUCOPHAGE) 500 MG tablet Take 500 mg by  mouth 2 (two) times daily with a meal.    . oseltamivir (TAMIFLU) 30 MG capsule Take 1 capsule (30 mg total) by mouth 2 (two) times daily. For 5 days 10 capsule 0  . zolpidem (AMBIEN) 5 MG tablet Take 1.5 mg by mouth at bedtime. Pt breaks tablet into thirds and takes 1/3 tablet nightly     No current facility-administered medications for this visit.     Allergies: No Known Allergies  Past Medical History, Surgical history, Social history, and Family History were reviewed and updated.  Physical Exam: Blood pressure 148/76, pulse 84, temperature 98.2 F (36.8 C), temperature source Oral, resp. rate 18, height 5' 7" (1.702 m), weight 149 lb 8 oz (67.813 kg), SpO2 100 %. ECOG: 0 General appearance: alert and cooperative  Head: Normocephalic, without obvious abnormality, atraumatic Neck: no adenopathy. No masses or lesions or discoloration. Lymph nodes: Cervical, supraclavicular, and axillary nodes normal. Heart:regular rate and rhythm, S1, S2 normal, no murmur, click, rub or gallop Lung:chest clear, no wheezing, rales, normal symmetric air entry Abdomin: soft, non-tender, without masses or organomegaly EXT:no erythema, induration, or nodules   Lab Results: Lab Results  Component Value Date   WBC 4.4 01/17/2015   HGB 14.0 01/17/2015   HCT 39.5 01/17/2015   MCV 84.0 01/17/2015   PLT 122* 01/17/2015     Chemistry      Component Value Date/Time   NA 140 10/17/2014 1156   NA 137 09/18/2013 0501   NA 143 02/15/2012 1404   K 4.4 10/17/2014 1156   K 3.7 09/18/2013 0501   K 4.4 02/15/2012 1404   CL   99 09/18/2013 0501   CL 104 01/10/2013 1128   CL 99 02/15/2012 1404   CO2 28 10/17/2014 1156   CO2 27 09/18/2013 0501   CO2 28 02/15/2012 1404   BUN 14.2 10/17/2014 1156   BUN 13 09/18/2013 0501   BUN 14 02/15/2012 1404   CREATININE 1.2 10/17/2014 1156   CREATININE 1.04 09/18/2013 0501   CREATININE 1.0 02/15/2012 1404      Component Value Date/Time   CALCIUM 9.6 10/17/2014 1156    CALCIUM 9.0 09/18/2013 0501   CALCIUM 9.2 02/15/2012 1404   ALKPHOS 120 10/17/2014 1156   ALKPHOS 117 09/17/2013 0652   ALKPHOS 99* 02/15/2012 1404   AST 18 10/17/2014 1156   AST 17 09/17/2013 0652   AST 20 02/15/2012 1404   ALT 20 10/17/2014 1156   ALT 14 09/17/2013 0652   ALT 19 02/15/2012 1404   BILITOT 1.15 10/17/2014 1156   BILITOT 1.0 09/17/2013 0652   BILITOT 1.30 02/15/2012 1404       Impression and Plan:  74 year old gentleman with the following issues:  1.Stage III chronic lymphocytic leukemia. He presented with nonbulky lymphadenopathy at diagnosis. Mild splenomegaly. He had a excellent response to FCR and his current blood counts showed no evidence of lymphocytosis at this time. He has no clinical signs or symptoms of disease progression and will continue an active surveillance. I plan on repeat laboratory testing and a physical examination in 6 months. We will repeat imaging studies as needed.  2. 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 Status post surgical resection in April 2009 without any evidence of recurrence. It is unlikely that he will have a relapse from this disease and he is up to date on his screening colonoscopies.   3. Thrombocytopenia: His platelet count is close to normal and I been relatively stable.  4. Dysphagia: This have resolved since the last visit.  5. Followup: Will be in 6 months.   [facility], MD 4/21/20169:55 AM

## 2015-03-26 ENCOUNTER — Telehealth: Payer: Self-pay | Admitting: Oncology

## 2015-03-26 NOTE — Telephone Encounter (Signed)
per pt to move appt to following week/[place]-gave updated avs

## 2015-06-17 DIAGNOSIS — Z23 Encounter for immunization: Secondary | ICD-10-CM | POA: Diagnosis not present

## 2015-06-17 DIAGNOSIS — Z1389 Encounter for screening for other disorder: Secondary | ICD-10-CM | POA: Diagnosis not present

## 2015-06-17 DIAGNOSIS — I10 Essential (primary) hypertension: Secondary | ICD-10-CM | POA: Diagnosis not present

## 2015-06-17 DIAGNOSIS — E119 Type 2 diabetes mellitus without complications: Secondary | ICD-10-CM | POA: Diagnosis not present

## 2015-06-17 DIAGNOSIS — D696 Thrombocytopenia, unspecified: Secondary | ICD-10-CM | POA: Diagnosis not present

## 2015-06-17 DIAGNOSIS — C9110 Chronic lymphocytic leukemia of B-cell type not having achieved remission: Secondary | ICD-10-CM | POA: Diagnosis not present

## 2015-06-17 DIAGNOSIS — E780 Pure hypercholesterolemia: Secondary | ICD-10-CM | POA: Diagnosis not present

## 2015-06-17 DIAGNOSIS — Z136 Encounter for screening for cardiovascular disorders: Secondary | ICD-10-CM | POA: Diagnosis not present

## 2015-06-17 DIAGNOSIS — G4700 Insomnia, unspecified: Secondary | ICD-10-CM | POA: Diagnosis not present

## 2015-06-17 DIAGNOSIS — Z0000 Encounter for general adult medical examination without abnormal findings: Secondary | ICD-10-CM | POA: Diagnosis not present

## 2015-06-18 ENCOUNTER — Other Ambulatory Visit: Payer: Self-pay | Admitting: Family Medicine

## 2015-06-18 DIAGNOSIS — Z87891 Personal history of nicotine dependence: Secondary | ICD-10-CM

## 2015-06-24 ENCOUNTER — Ambulatory Visit
Admission: RE | Admit: 2015-06-24 | Discharge: 2015-06-24 | Disposition: A | Discharge status: Home / Self Care | Payer: Medicare Other | Source: Ambulatory Visit | Attending: Family Medicine | Admitting: Family Medicine

## 2015-06-24 DIAGNOSIS — Z87891 Personal history of nicotine dependence: Secondary | ICD-10-CM

## 2015-06-24 DIAGNOSIS — Z136 Encounter for screening for cardiovascular disorders: Secondary | ICD-10-CM | POA: Diagnosis not present

## 2015-07-18 ENCOUNTER — Ambulatory Visit: Payer: PRIVATE HEALTH INSURANCE | Admitting: Oncology

## 2015-07-18 ENCOUNTER — Other Ambulatory Visit: Payer: PRIVATE HEALTH INSURANCE

## 2015-07-26 ENCOUNTER — Telehealth: Payer: Self-pay | Admitting: Oncology

## 2015-07-26 ENCOUNTER — Other Ambulatory Visit (HOSPITAL_BASED_OUTPATIENT_CLINIC_OR_DEPARTMENT_OTHER): Payer: Medicare Other

## 2015-07-26 ENCOUNTER — Ambulatory Visit (HOSPITAL_BASED_OUTPATIENT_CLINIC_OR_DEPARTMENT_OTHER): Payer: Medicare Other | Admitting: Oncology

## 2015-07-26 VITALS — BP 137/71 | HR 89 | Temp 98.1°F | Resp 18 | Ht 67.0 in | Wt 145.1 lb

## 2015-07-26 DIAGNOSIS — C189 Malignant neoplasm of colon, unspecified: Secondary | ICD-10-CM

## 2015-07-26 DIAGNOSIS — Z8579 Personal history of other malignant neoplasms of lymphoid, hematopoietic and related tissues: Secondary | ICD-10-CM

## 2015-07-26 DIAGNOSIS — Z85038 Personal history of other malignant neoplasm of large intestine: Secondary | ICD-10-CM | POA: Diagnosis not present

## 2015-07-26 DIAGNOSIS — C9110 Chronic lymphocytic leukemia of B-cell type not having achieved remission: Secondary | ICD-10-CM

## 2015-07-26 LAB — CBC WITH DIFFERENTIAL/PLATELET
BASO%: 0.5 % (ref 0.0–2.0)
Basophils Absolute: 0 10*3/uL (ref 0.0–0.1)
EOS%: 3.6 % (ref 0.0–7.0)
Eosinophils Absolute: 0.2 10*3/uL (ref 0.0–0.5)
HCT: 40 % (ref 38.4–49.9)
HGB: 13.4 g/dL (ref 13.0–17.1)
LYMPH%: 34.3 % (ref 14.0–49.0)
MCH: 28.5 pg (ref 27.2–33.4)
MCHC: 33.5 g/dL (ref 32.0–36.0)
MCV: 85.3 fL (ref 79.3–98.0)
MONO#: 0.4 10*3/uL (ref 0.1–0.9)
MONO%: 7.9 % (ref 0.0–14.0)
NEUT#: 2.7 10*3/uL (ref 1.5–6.5)
NEUT%: 53.7 % (ref 39.0–75.0)
Platelets: 168 10*3/uL (ref 140–400)
RBC: 4.69 10*6/uL (ref 4.20–5.82)
RDW: 13.7 % (ref 11.0–14.6)
WBC: 5.1 10*3/uL (ref 4.0–10.3)
lymph#: 1.7 10*3/uL (ref 0.9–3.3)

## 2015-07-26 LAB — COMPREHENSIVE METABOLIC PANEL (CC13)
ALT: 12 U/L (ref 0–55)
AST: 12 U/L (ref 5–34)
Albumin: 4.1 g/dL (ref 3.5–5.0)
Alkaline Phosphatase: 139 U/L (ref 40–150)
Anion Gap: 7 mEq/L (ref 3–11)
BUN: 17.7 mg/dL (ref 7.0–26.0)
CO2: 27 mEq/L (ref 22–29)
Calcium: 9.9 mg/dL (ref 8.4–10.4)
Chloride: 107 mEq/L (ref 98–109)
Creatinine: 1.2 mg/dL (ref 0.7–1.3)
EGFR: 67 mL/min/{1.73_m2} — ABNORMAL LOW (ref >90)
Glucose: 193 mg/dl — ABNORMAL HIGH (ref 70–140)
Potassium: 4.5 mEq/L (ref 3.5–5.1)
Sodium: 141 mEq/L (ref 136–145)
Total Bilirubin: 0.79 mg/dL (ref 0.20–1.20)
Total Protein: 7.2 g/dL (ref 6.4–8.3)

## 2015-07-26 NOTE — Progress Notes (Signed)
Hematology and Oncology Follow Up Visit  [name] [id number] 1941-08-09 74 y.o. 07/26/2015 1:23 PM [name] [name], [name], MD   Principle Diagnosis: 74 year old gentleman with the following diagnoses:  1. T2 N0 rectal cancer diagnosed in April of 2009. Status post low anterior resection on 01/09/2008. He was found to have a poorly differentiated adenocarcinoma without any lymph node involvement.  2. CLL diagnosed in August of 2010 presented with lymphocytosis and a bone marrow biopsy confirmed the presence of CLL.   Prior Therapy: He is status post FCR chemotherapy included on April of 2013. He has been in CR since that time.  Current therapy: Observation and surveillance.  Interim History: [name] presents today for a followup visit. Since the last visit, he reports no major changes. He was able to travel to [place], [place] and [place] for vacation and did have some problems with motion sickness and some GI distress which have resolved completely.   He is able to eat and maintain his weight better since the last visit. He has not reported any lymphadenopathy or constitutional symptoms. He did not report any petechiae or easy bruisability. He does not report any change in his bowel habits. He is up-to-date on colonoscopies at this time and have received 2 of whom since his diagnosis of colon cancer.  He is not reporting any headaches blurred vision double vision. Has not reported any fevers or chills or sweats. As that report any abdominal pain or early satiety. Has not reported any hematochezia or melena. Has not reported any genitourinary complaints of frequency urgency or hesitancy. He has not reported any musculoskeletal complaints. Is not reporting any arthralgias or myalgias. He continue to be very active and with excellent performance status and quality of life. Rest of his review of systems unremarkable.  Medications: I have reviewed the patient's current  medications.  Current Outpatient Prescriptions  Medication Sig Dispense Refill  . aspirin 81 MG tablet Take 81 mg by mouth every morning.     Marland Kitchen atorvastatin (LIPITOR) 40 MG tablet Take 40 mg by mouth every evening.     . metFORMIN (GLUCOPHAGE) 500 MG tablet Take 500 mg by mouth 2 (two) times daily with a meal.    . oseltamivir (TAMIFLU) 30 MG capsule Take 1 capsule (30 mg total) by mouth 2 (two) times daily. For 5 days 10 capsule 0  . zolpidem (AMBIEN) 5 MG tablet Take 1.5 mg by mouth at bedtime. Pt breaks tablet into thirds and takes 1/3 tablet nightly     No current facility-administered medications for this visit.     Allergies: No Known Allergies  Past Medical History, Surgical history, Social history, and Family History were reviewed and updated.  Physical Exam: Blood pressure 137/71, pulse 89, temperature 98.1 F (36.7 C), temperature source Oral, resp. rate 18, height _0  (1.702 m), weight 145 lb 1.6 oz (65.817 kg), SpO2 100 %. ECOG: 0 General appearance: alert and cooperative appeared in no distress. Head: Normocephalic, without obvious abnormality no oral ulcers or lesions. Neck: no adenopathy. No masses or lesions or discoloration. Lymph nodes: Cervical, supraclavicular, and axillary nodes normal. Heart:regular rate and rhythm, S1, S2 normal, no murmur, click, rub or gallop Lung:chest clear, no wheezing, rales, normal symmetric air entry Abdomin: soft, non-tender, without masses or organomegaly EXT:no erythema, induration, or nodules   Lab Results: Lab Results  Component Value Date   WBC 5.1 07/26/2015   HGB 13.4 07/26/2015   HCT 40.0 07/26/2015   MCV 85.3 07/26/2015  PLT 168 07/26/2015     Chemistry      Component Value Date/Time   NA 142 01/17/2015 0934   NA 137 09/18/2013 0501   NA 143 02/15/2012 1404   K 4.3 01/17/2015 0934   K 3.7 09/18/2013 0501   K 4.4 02/15/2012 1404   CL 99 09/18/2013 0501   CL 104 01/10/2013 1128   CL 99 02/15/2012 1404   CO2  22 01/17/2015 0934   CO2 27 09/18/2013 0501   CO2 28 02/15/2012 1404   BUN 15.1 01/17/2015 0934   BUN 13 09/18/2013 0501   BUN 14 02/15/2012 1404   CREATININE 1.1 01/17/2015 0934   CREATININE 1.04 09/18/2013 0501   CREATININE 1.0 02/15/2012 1404      Component Value Date/Time   CALCIUM 9.3 01/17/2015 0934   CALCIUM 9.0 09/18/2013 0501   CALCIUM 9.2 02/15/2012 1404   ALKPHOS 114 01/17/2015 0934   ALKPHOS 117 09/17/2013 0652   ALKPHOS 99* 02/15/2012 1404   AST 14 01/17/2015 0934   AST 17 09/17/2013 0652   AST 20 02/15/2012 1404   ALT 15 01/17/2015 0934   ALT 14 09/17/2013 0652   ALT 19 02/15/2012 1404   BILITOT 1.25* 01/17/2015 0934   BILITOT 1.0 09/17/2013 0652   BILITOT 1.30 02/15/2012 1404       Impression and Plan:  74 year old gentleman with the following issues:  1.Stage III chronic lymphocytic leukemia. He presented with nonbulky lymphadenopathy at diagnosis. Mild splenomegaly. He had a excellent response to FCR.   He continues to be in remission at this time based on his laboratory data that were reviewed today and his physical examination. Plan is to continue with surveillance every 6 months including physical examination laboratory testing and repeat imaging studies as needed.   2. 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 Status post surgical resection in April 2009 without any evidence of recurrence. It is unlikely that he will have a relapse from this disease. I have urged him to continue with surveillance colonoscopies.  3. Thrombocytopenia: Counts are within normal range at this time.  4. Followup: Will be in 6 months.   [name], MD 10/28/20161:23 PM

## 2015-07-26 NOTE — Telephone Encounter (Signed)
Gave adn printed appt sched and avs for pt for April 2017 °

## 2015-10-08 DIAGNOSIS — H2513 Age-related nuclear cataract, bilateral: Secondary | ICD-10-CM | POA: Diagnosis not present

## 2015-10-08 DIAGNOSIS — E119 Type 2 diabetes mellitus without complications: Secondary | ICD-10-CM | POA: Diagnosis not present

## 2015-10-08 DIAGNOSIS — H40012 Open angle with borderline findings, low risk, left eye: Secondary | ICD-10-CM | POA: Diagnosis not present

## 2015-10-08 DIAGNOSIS — H40011 Open angle with borderline findings, low risk, right eye: Secondary | ICD-10-CM | POA: Diagnosis not present

## 2015-12-16 DIAGNOSIS — E7800 Pure hypercholesterolemia, unspecified: Secondary | ICD-10-CM | POA: Diagnosis not present

## 2015-12-16 DIAGNOSIS — E119 Type 2 diabetes mellitus without complications: Secondary | ICD-10-CM | POA: Diagnosis not present

## 2015-12-16 DIAGNOSIS — C9110 Chronic lymphocytic leukemia of B-cell type not having achieved remission: Secondary | ICD-10-CM | POA: Diagnosis not present

## 2015-12-16 DIAGNOSIS — I10 Essential (primary) hypertension: Secondary | ICD-10-CM | POA: Diagnosis not present

## 2015-12-16 DIAGNOSIS — Z7984 Long term (current) use of oral hypoglycemic drugs: Secondary | ICD-10-CM | POA: Diagnosis not present

## 2015-12-16 DIAGNOSIS — D696 Thrombocytopenia, unspecified: Secondary | ICD-10-CM | POA: Diagnosis not present

## 2015-12-16 DIAGNOSIS — G4700 Insomnia, unspecified: Secondary | ICD-10-CM | POA: Diagnosis not present

## 2016-01-24 ENCOUNTER — Ambulatory Visit (HOSPITAL_BASED_OUTPATIENT_CLINIC_OR_DEPARTMENT_OTHER): Payer: Medicare Other | Admitting: Oncology

## 2016-01-24 ENCOUNTER — Telehealth: Payer: Self-pay | Admitting: Oncology

## 2016-01-24 ENCOUNTER — Other Ambulatory Visit (HOSPITAL_BASED_OUTPATIENT_CLINIC_OR_DEPARTMENT_OTHER): Payer: Medicare Other

## 2016-01-24 VITALS — BP 148/68 | HR 96 | Temp 98.4°F | Resp 18 | Ht 67.0 in | Wt 148.3 lb

## 2016-01-24 DIAGNOSIS — D6959 Other secondary thrombocytopenia: Secondary | ICD-10-CM | POA: Diagnosis not present

## 2016-01-24 DIAGNOSIS — C189 Malignant neoplasm of colon, unspecified: Secondary | ICD-10-CM

## 2016-01-24 DIAGNOSIS — C9110 Chronic lymphocytic leukemia of B-cell type not having achieved remission: Secondary | ICD-10-CM

## 2016-01-24 DIAGNOSIS — Z8579 Personal history of other malignant neoplasms of lymphoid, hematopoietic and related tissues: Secondary | ICD-10-CM | POA: Diagnosis not present

## 2016-01-24 DIAGNOSIS — Z85038 Personal history of other malignant neoplasm of large intestine: Secondary | ICD-10-CM

## 2016-01-24 LAB — COMPREHENSIVE METABOLIC PANEL
ALT: 17 U/L (ref 0–55)
AST: 14 U/L (ref 5–34)
Albumin: 4.4 g/dL (ref 3.5–5.0)
Alkaline Phosphatase: 120 U/L (ref 40–150)
Anion Gap: 9 mEq/L (ref 3–11)
BUN: 18.6 mg/dL (ref 7.0–26.0)
CO2: 29 mEq/L (ref 22–29)
Calcium: 10.2 mg/dL (ref 8.4–10.4)
Chloride: 104 mEq/L (ref 98–109)
Creatinine: 1.3 mg/dL (ref 0.7–1.3)
EGFR: 62 mL/min/{1.73_m2} — ABNORMAL LOW (ref >90)
Glucose: 172 mg/dl — ABNORMAL HIGH (ref 70–140)
Potassium: 4.3 mEq/L (ref 3.5–5.1)
Sodium: 141 mEq/L (ref 136–145)
Total Bilirubin: 1.06 mg/dL (ref 0.20–1.20)
Total Protein: 7.4 g/dL (ref 6.4–8.3)

## 2016-01-24 LAB — CBC WITH DIFFERENTIAL/PLATELET
BASO%: 0.2 % (ref 0.0–2.0)
Basophils Absolute: 0 10*3/uL (ref 0.0–0.1)
EOS%: 3.9 % (ref 0.0–7.0)
Eosinophils Absolute: 0.2 10*3/uL (ref 0.0–0.5)
HCT: 39.5 % (ref 38.4–49.9)
HGB: 13.9 g/dL (ref 13.0–17.1)
LYMPH%: 31.7 % (ref 14.0–49.0)
MCH: 29.3 pg (ref 27.2–33.4)
MCHC: 35.2 g/dL (ref 32.0–36.0)
MCV: 83.2 fL (ref 79.3–98.0)
MONO#: 0.5 10*3/uL (ref 0.1–0.9)
MONO%: 10.7 % (ref 0.0–14.0)
NEUT#: 2.4 10*3/uL (ref 1.5–6.5)
NEUT%: 53.5 % (ref 39.0–75.0)
Platelets: 120 10*3/uL — ABNORMAL LOW (ref 140–400)
RBC: 4.75 10*6/uL (ref 4.20–5.82)
RDW: 12.9 % (ref 11.0–14.6)
WBC: 4.4 10*3/uL (ref 4.0–10.3)
lymph#: 1.4 10*3/uL (ref 0.9–3.3)

## 2016-01-24 NOTE — Progress Notes (Signed)
Hematology and Oncology Follow Up Visit  [name] [id number] 08-01-1941 75 y.o. 01/24/2016 1:12 PM [name] [name], [name], MD   Principle Diagnosis: 75 year old gentleman with the following diagnoses:  1. T2 N0 rectal cancer diagnosed in April of 2009. Status post low anterior resection on 01/09/2008. He was found to have a poorly differentiated adenocarcinoma without any lymph node involvement.  2. CLL diagnosed in August of 2010 presented with lymphocytosis and a bone marrow biopsy confirmed the presence of CLL.   Prior Therapy: He is status post FCR chemotherapy included on April of 2013. He has been in CR since that time.  Current therapy: Observation and surveillance.  Interim History: [name] presents today for a followup visit with his wife. Since the last visit, he reports feeling very well without any recent complaints. He does report some mild tension headaches that happen very infrequently. He usually takes Tylenol which helped his symptoms. He denied any constitutional symptoms of fevers or chills or weight loss. His appetite remain excellent and have gained 3 pounds since last visit. He has not reported any lymphadenopathy  He did not report any petechiae or easy bruisability. He does not report any change in his bowel habits.   He is not reporting any headaches blurred vision double vision. Has not reported any fevers or chills or sweats. As that report any abdominal pain or early satiety. Has not reported any hematochezia or melena. Has not reported any genitourinary complaints of frequency urgency or hesitancy. He has not reported any musculoskeletal complaints. Is not reporting any arthralgias or myalgias. He continue to be very active and with excellent performance status and quality of life. Rest of his review of systems unremarkable.  Medications: I have reviewed the patient's current medications.  Current Outpatient Prescriptions  Medication Sig  Dispense Refill  . aspirin 81 MG tablet Take 81 mg by mouth every morning.     Marland Kitchen atorvastatin (LIPITOR) 40 MG tablet Take 40 mg by mouth every evening.     . metFORMIN (GLUCOPHAGE) 500 MG tablet Take 500 mg by mouth 2 (two) times daily with a meal.    . oseltamivir (TAMIFLU) 30 MG capsule Take 1 capsule (30 mg total) by mouth 2 (two) times daily. For 5 days 10 capsule 0  . zolpidem (AMBIEN) 5 MG tablet Take 1.5 mg by mouth at bedtime. Pt breaks tablet into thirds and takes 1/3 tablet nightly     No current facility-administered medications for this visit.     Allergies: No Known Allergies  Past Medical History, Surgical history, Social history, and Family History were reviewed and updated.  Physical Exam: Blood pressure 148/68, pulse 96, temperature 98.4 F (36.9 C), temperature source Oral, resp. rate 18, height 5' 7"  (1.702 m), weight 148 lb 4.8 oz (67.268 kg), SpO2 98 %. ECOG: 0 General appearance: Pleasant-appearing gentleman appeared well without distress. Head: Normocephalic, without obvious abnormality no oral ulcers or lesions. Neck: no adenopathy. No masses or lesions or discoloration. Lymph nodes: Cervical, supraclavicular, and axillary nodes normal. Heart:regular rate and rhythm, S1, S2 normal, no murmur, click, rub or gallop Lung:chest clear, no wheezing, rales, normal symmetric air entry Abdomin: soft, non-tender, without masses or organomegaly or shifting dullness or ascites. EXT:no erythema, induration, or nodules   Lab Results: Lab Results  Component Value Date   WBC 4.4 01/24/2016   HGB 13.9 01/24/2016   HCT 39.5 01/24/2016   MCV 83.2 01/24/2016   PLT 120* 01/24/2016     Chemistry  Component Value Date/Time   NA 141 07/26/2015 1257   NA 137 09/18/2013 0501   NA 143 02/15/2012 1404   K 4.5 07/26/2015 1257   K 3.7 09/18/2013 0501   K 4.4 02/15/2012 1404   CL 99 09/18/2013 0501   CL 104 01/10/2013 1128   CL 99 02/15/2012 1404   CO2 27 07/26/2015  1257   CO2 27 09/18/2013 0501   CO2 28 02/15/2012 1404   BUN 17.7 07/26/2015 1257   BUN 13 09/18/2013 0501   BUN 14 02/15/2012 1404   CREATININE 1.2 07/26/2015 1257   CREATININE 1.04 09/18/2013 0501   CREATININE 1.0 02/15/2012 1404      Component Value Date/Time   CALCIUM 9.9 07/26/2015 1257   CALCIUM 9.0 09/18/2013 0501   CALCIUM 9.2 02/15/2012 1404   ALKPHOS 139 07/26/2015 1257   ALKPHOS 117 09/17/2013 0652   ALKPHOS 99* 02/15/2012 1404   AST 12 07/26/2015 1257   AST 17 09/17/2013 0652   AST 20 02/15/2012 1404   ALT 12 07/26/2015 1257   ALT 14 09/17/2013 0652   ALT 19 02/15/2012 1404   BILITOT 0.79 07/26/2015 1257   BILITOT 1.0 09/17/2013 0652   BILITOT 1.30 02/15/2012 1404       Impression and Plan:  75 year old gentleman with the following issues:  1.Stage III chronic lymphocytic leukemia. He presented with nonbulky lymphadenopathy at diagnosis as well as splenomegaly. He had a excellent response to FCR and continues to be in remission since the conclusion of therapy in April 2013.Marland Kitchen   He continues to be on active surveillance and physical examination as well as laboratory testing from today is not reveal any evidence of recurrent disease. There is no indication to repeat imaging studies at this time unless he becomes symptomatic.  2. 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 Status post surgical resection in April 2009 without any evidence of recurrence. No evidence of relapse at this time. He is up-to-date on colonoscopy.  3. Thrombocytopenia: He continues to have very mild fluctuating thrombocytopenia without any evidence of bleeding or bruising. We'll continue to monitor without intervention.  4. Followup: Will be in 6 months.   [name], MD 4/28/20171:12 PM

## 2016-01-24 NOTE — Telephone Encounter (Signed)
per pof to sch pt appt-gave pt copy of avs °

## 2016-06-25 DIAGNOSIS — Z1389 Encounter for screening for other disorder: Secondary | ICD-10-CM | POA: Diagnosis not present

## 2016-06-25 DIAGNOSIS — C9111 Chronic lymphocytic leukemia of B-cell type in remission: Secondary | ICD-10-CM | POA: Diagnosis not present

## 2016-06-25 DIAGNOSIS — G4700 Insomnia, unspecified: Secondary | ICD-10-CM | POA: Diagnosis not present

## 2016-06-25 DIAGNOSIS — Z0000 Encounter for general adult medical examination without abnormal findings: Secondary | ICD-10-CM | POA: Diagnosis not present

## 2016-06-25 DIAGNOSIS — Z125 Encounter for screening for malignant neoplasm of prostate: Secondary | ICD-10-CM | POA: Diagnosis not present

## 2016-06-25 DIAGNOSIS — D696 Thrombocytopenia, unspecified: Secondary | ICD-10-CM | POA: Diagnosis not present

## 2016-06-25 DIAGNOSIS — Z7984 Long term (current) use of oral hypoglycemic drugs: Secondary | ICD-10-CM | POA: Diagnosis not present

## 2016-06-25 DIAGNOSIS — Z85038 Personal history of other malignant neoplasm of large intestine: Secondary | ICD-10-CM | POA: Diagnosis not present

## 2016-06-25 DIAGNOSIS — E119 Type 2 diabetes mellitus without complications: Secondary | ICD-10-CM | POA: Diagnosis not present

## 2016-06-25 DIAGNOSIS — E7800 Pure hypercholesterolemia, unspecified: Secondary | ICD-10-CM | POA: Diagnosis not present

## 2016-06-25 DIAGNOSIS — Z23 Encounter for immunization: Secondary | ICD-10-CM | POA: Diagnosis not present

## 2016-06-25 DIAGNOSIS — I10 Essential (primary) hypertension: Secondary | ICD-10-CM | POA: Diagnosis not present

## 2016-06-29 DIAGNOSIS — S20229A Contusion of unspecified back wall of thorax, initial encounter: Secondary | ICD-10-CM | POA: Diagnosis not present

## 2016-07-24 ENCOUNTER — Other Ambulatory Visit (HOSPITAL_BASED_OUTPATIENT_CLINIC_OR_DEPARTMENT_OTHER): Payer: Medicare Other

## 2016-07-24 ENCOUNTER — Telehealth: Payer: Self-pay | Admitting: Oncology

## 2016-07-24 ENCOUNTER — Ambulatory Visit (HOSPITAL_BASED_OUTPATIENT_CLINIC_OR_DEPARTMENT_OTHER): Payer: Medicare Other | Admitting: Oncology

## 2016-07-24 VITALS — BP 111/65 | HR 74 | Temp 97.7°F | Resp 17 | Ht 67.0 in | Wt 143.8 lb

## 2016-07-24 DIAGNOSIS — C9110 Chronic lymphocytic leukemia of B-cell type not having achieved remission: Secondary | ICD-10-CM

## 2016-07-24 DIAGNOSIS — C189 Malignant neoplasm of colon, unspecified: Secondary | ICD-10-CM

## 2016-07-24 DIAGNOSIS — Z8579 Personal history of other malignant neoplasms of lymphoid, hematopoietic and related tissues: Secondary | ICD-10-CM

## 2016-07-24 DIAGNOSIS — Z85038 Personal history of other malignant neoplasm of large intestine: Secondary | ICD-10-CM

## 2016-07-24 DIAGNOSIS — D696 Thrombocytopenia, unspecified: Secondary | ICD-10-CM

## 2016-07-24 LAB — CBC WITH DIFFERENTIAL/PLATELET
BASO%: 0.6 % (ref 0.0–2.0)
Basophils Absolute: 0 10*3/uL (ref 0.0–0.1)
EOS%: 3.9 % (ref 0.0–7.0)
Eosinophils Absolute: 0.2 10*3/uL (ref 0.0–0.5)
HCT: 42.1 % (ref 38.4–49.9)
HGB: 14.1 g/dL (ref 13.0–17.1)
LYMPH%: 35.2 % (ref 14.0–49.0)
MCH: 28.6 pg (ref 27.2–33.4)
MCHC: 33.6 g/dL (ref 32.0–36.0)
MCV: 85.3 fL (ref 79.3–98.0)
MONO#: 0.5 10*3/uL (ref 0.1–0.9)
MONO%: 9.9 % (ref 0.0–14.0)
NEUT#: 2.6 10*3/uL (ref 1.5–6.5)
NEUT%: 50.4 % (ref 39.0–75.0)
Platelets: 130 10*3/uL — ABNORMAL LOW (ref 140–400)
RBC: 4.93 10*6/uL (ref 4.20–5.82)
RDW: 13.7 % (ref 11.0–14.6)
WBC: 5.2 10*3/uL (ref 4.0–10.3)
lymph#: 1.8 10*3/uL (ref 0.9–3.3)

## 2016-07-24 LAB — COMPREHENSIVE METABOLIC PANEL
ALT: 12 U/L (ref 0–55)
AST: 13 U/L (ref 5–34)
Albumin: 4.2 g/dL (ref 3.5–5.0)
Alkaline Phosphatase: 131 U/L (ref 40–150)
Anion Gap: 10 mEq/L (ref 3–11)
BUN: 17.6 mg/dL (ref 7.0–26.0)
CO2: 24 mEq/L (ref 22–29)
Calcium: 9.9 mg/dL (ref 8.4–10.4)
Chloride: 107 mEq/L (ref 98–109)
Creatinine: 1.2 mg/dL (ref 0.7–1.3)
EGFR: 70 mL/min/{1.73_m2} — ABNORMAL LOW (ref >90)
Glucose: 136 mg/dl (ref 70–140)
Potassium: 4.4 mEq/L (ref 3.5–5.1)
Sodium: 141 mEq/L (ref 136–145)
Total Bilirubin: 0.98 mg/dL (ref 0.20–1.20)
Total Protein: 7.5 g/dL (ref 6.4–8.3)

## 2016-07-24 MED ORDER — PROCHLORPERAZINE MALEATE 10 MG PO TABS: 10.0000 mg | ORAL_TABLET | Freq: Four times a day (QID) | ORAL | 0 refills | Status: DC | PRN

## 2016-07-24 NOTE — Telephone Encounter (Signed)
Appointments scheduled per 10/27 LOS. Patient given AVS report and calendar of future scheduled appointments.

## 2016-07-24 NOTE — Progress Notes (Signed)
Hematology and Oncology Follow Up Visit  [name] [id number] Jun 29, 1941 75 y.o. 07/24/2016 2:54 PM [name],ROBERT Sheppard Coil, MDReade, [name], MD   Principle Diagnosis: 75 year old gentleman with the following diagnoses:  1. T2 N0 rectal cancer diagnosed in April of 2009. Status post low anterior resection on 01/09/2008. He was found to have a poorly differentiated adenocarcinoma without any lymph node involvement.  2. CLL diagnosed in August of 2010 presented with lymphocytosis and a bone marrow biopsy confirmed the presence of CLL.   Prior Therapy: He is status post FCR chemotherapy included on April of 2013. He has been in CR since that time.  Current therapy: Observation and surveillance.  Interim History: [name] presents today for a followup visit. Since the last visit, he reports continuing to feel reasonably well. He does report a periodic nausea but it is very infrequent in nature. He does not report any vomiting or change in his appetite. His weight has fluctuated for last 2 years around 145 pounds.    He has not reported any lymphadenopathy or constitutional symptoms. He did not report any petechiae or easy bruisability. He does not report any change in his bowel habits. He is up-to-date on colonoscopies at this time and have received 2 of whom since his diagnosis of colon cancer. He is scheduled to have a colonoscopy within the next year.  He is not reporting any headaches blurred vision double vision. Has not reported any fevers or chills or sweats. As that report any abdominal pain or early satiety. Has not reported any hematochezia or melena. Has not reported any genitourinary complaints of frequency urgency or hesitancy. He has not reported any musculoskeletal complaints. Is not reporting any arthralgias or myalgias. He continue to be very active and with excellent performance status and quality of life. Rest of his review of systems unremarkable.  Medications: I have  reviewed the patient's current medications.  Current Outpatient Prescriptions  Medication Sig Dispense Refill  . aspirin 81 MG tablet Take 81 mg by mouth every morning.     Marland Kitchen atorvastatin (LIPITOR) 40 MG tablet Take 40 mg by mouth every evening.     . metFORMIN (GLUCOPHAGE) 500 MG tablet Take 500 mg by mouth 2 (two) times daily with a meal.    . oseltamivir (TAMIFLU) 30 MG capsule Take 1 capsule (30 mg total) by mouth 2 (two) times daily. For 5 days 10 capsule 0  . prochlorperazine (COMPAZINE) 10 MG tablet Take 1 tablet (10 mg total) by mouth every 6 (six) hours as needed for nausea or vomiting. 30 tablet 0  . zolpidem (AMBIEN) 5 MG tablet Take 1.5 mg by mouth at bedtime. Pt breaks tablet into thirds and takes 1/3 tablet nightly     No current facility-administered medications for this visit.      Allergies: No Known Allergies  Past Medical History, Surgical history, Social history, and Family History were reviewed and updated.  Physical Exam: Blood pressure 111/65, pulse 74, temperature 97.7 F (36.5 C), temperature source Oral, resp. rate 17, height 5' 7"  (1.702 m), weight 143 lb 12.8 oz (65.2 kg), SpO2 100 %. ECOG: 0 General appearance: Well appearing gentleman without distress. Head: Normocephalic, without obvious abnormality no oral thrush noted. Neck: no adenopathy. No masses or lesions or discoloration. Lymph nodes: Cervical, supraclavicular, and axillary nodes normal. Heart:regular rate and rhythm, S1, S2 normal, no murmur, click, rub or gallop Lung:chest clear, no wheezing, rales, normal symmetric air entry Abdomin: soft, non-tender, without masses or organomegaly no rebound  or guarding. EXT:no erythema, induration, or nodules   Lab Results: Lab Results  Component Value Date   WBC 5.2 07/24/2016   HGB 14.1 07/24/2016   HCT 42.1 07/24/2016   MCV 85.3 07/24/2016   PLT 130 (L) 07/24/2016     Chemistry      Component Value Date/Time   NA 141 01/24/2016 1241   K 4.3  01/24/2016 1241   CL 99 09/18/2013 0501   CL 104 01/10/2013 1128   CO2 29 01/24/2016 1241   BUN 18.6 01/24/2016 1241   CREATININE 1.3 01/24/2016 1241      Component Value Date/Time   CALCIUM 10.2 01/24/2016 1241   ALKPHOS 120 01/24/2016 1241   AST 14 01/24/2016 1241   ALT 17 01/24/2016 1241   BILITOT 1.06 01/24/2016 1241       Impression and Plan:  75 year old gentleman with the following issues:  1.Stage III chronic lymphocytic leukemia. He presented with leukocytosis and lymphadenopathy at diagnosis. He also had Mild splenomegaly. He had a excellent response to FCR.   He continues to be in remission at this time based on his laboratory data that were reviewed today and his physical examination. Plan is to continue with surveillance every 6 months including physical examination laboratory testing. I will repeat his imaging studies if he develops any symptoms or rapid rise in his white cell count.  2. 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 Status post surgical resection in April 2009 without any evidence of recurrence. It is unlikely that he will have a relapse from this disease. I have urged him to continue with surveillance colonoscopies. He is up-to-date and repeat colonoscopy scheduled for next year.  3. Thrombocytopenia: His Close to the normal range.  4. Nausea: Prescription for Compazine made available to the patient which she uses very infrequently.  5. Followup: Will be in 6 months.   PFYTWK,MQKMM, MD 10/27/20172:54 PM

## 2016-08-10 DIAGNOSIS — I10 Essential (primary) hypertension: Secondary | ICD-10-CM | POA: Diagnosis not present

## 2016-08-10 DIAGNOSIS — N529 Male erectile dysfunction, unspecified: Secondary | ICD-10-CM | POA: Diagnosis not present

## 2016-08-10 DIAGNOSIS — E119 Type 2 diabetes mellitus without complications: Secondary | ICD-10-CM | POA: Diagnosis not present

## 2016-12-24 DIAGNOSIS — I10 Essential (primary) hypertension: Secondary | ICD-10-CM | POA: Diagnosis not present

## 2016-12-24 DIAGNOSIS — D696 Thrombocytopenia, unspecified: Secondary | ICD-10-CM | POA: Diagnosis not present

## 2016-12-24 DIAGNOSIS — G4700 Insomnia, unspecified: Secondary | ICD-10-CM | POA: Diagnosis not present

## 2016-12-24 DIAGNOSIS — E7800 Pure hypercholesterolemia, unspecified: Secondary | ICD-10-CM | POA: Diagnosis not present

## 2016-12-24 DIAGNOSIS — Z7984 Long term (current) use of oral hypoglycemic drugs: Secondary | ICD-10-CM | POA: Diagnosis not present

## 2016-12-24 DIAGNOSIS — E119 Type 2 diabetes mellitus without complications: Secondary | ICD-10-CM | POA: Diagnosis not present

## 2016-12-24 DIAGNOSIS — C9110 Chronic lymphocytic leukemia of B-cell type not having achieved remission: Secondary | ICD-10-CM | POA: Diagnosis not present

## 2017-01-22 ENCOUNTER — Telehealth: Payer: Self-pay | Admitting: Oncology

## 2017-01-22 ENCOUNTER — Ambulatory Visit (HOSPITAL_BASED_OUTPATIENT_CLINIC_OR_DEPARTMENT_OTHER): Payer: Medicare Other | Admitting: Oncology

## 2017-01-22 ENCOUNTER — Other Ambulatory Visit (HOSPITAL_BASED_OUTPATIENT_CLINIC_OR_DEPARTMENT_OTHER): Payer: Medicare Other

## 2017-01-22 VITALS — BP 148/78 | HR 91 | Temp 98.4°F | Resp 18 | Ht 67.0 in | Wt 144.3 lb

## 2017-01-22 DIAGNOSIS — Z8579 Personal history of other malignant neoplasms of lymphoid, hematopoietic and related tissues: Secondary | ICD-10-CM

## 2017-01-22 DIAGNOSIS — Z85038 Personal history of other malignant neoplasm of large intestine: Secondary | ICD-10-CM

## 2017-01-22 DIAGNOSIS — C9110 Chronic lymphocytic leukemia of B-cell type not having achieved remission: Secondary | ICD-10-CM

## 2017-01-22 LAB — CBC WITH DIFFERENTIAL/PLATELET
BASO%: 0.2 % (ref 0.0–2.0)
Basophils Absolute: 0 10*3/uL (ref 0.0–0.1)
EOS%: 4 % (ref 0.0–7.0)
Eosinophils Absolute: 0.2 10*3/uL (ref 0.0–0.5)
HCT: 39.1 % (ref 38.4–49.9)
HGB: 13.9 g/dL (ref 13.0–17.1)
LYMPH%: 35.6 % (ref 14.0–49.0)
MCH: 29.8 pg (ref 27.2–33.4)
MCHC: 35.5 g/dL (ref 32.0–36.0)
MCV: 83.7 fL (ref 79.3–98.0)
MONO#: 0.5 10*3/uL (ref 0.1–0.9)
MONO%: 10 % (ref 0.0–14.0)
NEUT#: 2.4 10*3/uL (ref 1.5–6.5)
NEUT%: 50.2 % (ref 39.0–75.0)
Platelets: 126 10*3/uL — ABNORMAL LOW (ref 140–400)
RBC: 4.67 10*6/uL (ref 4.20–5.82)
RDW: 13 % (ref 11.0–14.6)
WBC: 4.8 10*3/uL (ref 4.0–10.3)
lymph#: 1.7 10*3/uL (ref 0.9–3.3)

## 2017-01-22 LAB — COMPREHENSIVE METABOLIC PANEL
ALT: 12 U/L (ref 0–55)
AST: 13 U/L (ref 5–34)
Albumin: 4.3 g/dL (ref 3.5–5.0)
Alkaline Phosphatase: 131 U/L (ref 40–150)
Anion Gap: 10 mEq/L (ref 3–11)
BUN: 18.1 mg/dL (ref 7.0–26.0)
CO2: 27 mEq/L (ref 22–29)
Calcium: 9.8 mg/dL (ref 8.4–10.4)
Chloride: 105 mEq/L (ref 98–109)
Creatinine: 1.3 mg/dL (ref 0.7–1.3)
EGFR: 65 mL/min/{1.73_m2} — ABNORMAL LOW (ref >90)
Glucose: 176 mg/dl — ABNORMAL HIGH (ref 70–140)
Potassium: 4.3 mEq/L (ref 3.5–5.1)
Sodium: 141 mEq/L (ref 136–145)
Total Bilirubin: 1.05 mg/dL (ref 0.20–1.20)
Total Protein: 7.1 g/dL (ref 6.4–8.3)

## 2017-01-22 NOTE — Telephone Encounter (Signed)
Appointments scheduled per 4.27.18 LOS. Patient given AVS report and calendars with future scheduled appointments.  °

## 2017-01-22 NOTE — Progress Notes (Signed)
Hematology and Oncology Follow Up Visit  [name] [id number] [birth year] 76 y.o. 01/22/2017 1:30 PM EdwardROBERT [name], [name], MD   Principle Diagnosis: 76 year old gentleman with the following diagnoses:  1. T2 N0 rectal cancer diagnosed in April of 2009. Status post low anterior resection on 01/09/2008. He was found to have a poorly differentiated adenocarcinoma without any lymph node involvement.  2. CLL diagnosed in August of 2010 presented with lymphocytosis and a bone marrow biopsy confirmed the presence of CLL.   Prior Therapy: He is status post FCR chemotherapy completed on on April of 2013. He has been in CR since that time.  Current therapy: Observation and surveillance.  Interim History: [name] presents today for a followup visit. Since the last visit, he reports no changes in his health. He continues to enjoy excellent quality of life and performance status. His appetite is about same and his weight is unchanged. He has not reported any lymphadenopathy or constitutional symptoms. He did not report any petechiae or easy bruisability. He denied any GI symptoms at this time.   He is not reporting any headaches blurred vision double vision. Has not reported any fevers or chills or sweats. As that report any abdominal pain or early satiety. Has not reported any hematochezia or melena. Has not reported any genitourinary complaints of frequency urgency or hesitancy. He has not reported any musculoskeletal complaints. Is not reporting any arthralgias or myalgias. He continue to be very active and with excellent performance status and quality of life. Rest of his review of systems unremarkable.  Medications: I have reviewed the patient's current medications.  Current Outpatient Prescriptions  Medication Sig Dispense Refill  . aspirin 81 MG tablet Take 81 mg by mouth every morning.     Marland Kitchen atorvastatin (LIPITOR) 40 MG tablet Take 40 mg by mouth every evening.     .  metFORMIN (GLUCOPHAGE) 500 MG tablet Take 500 mg by mouth 2 (two) times daily with a meal.    . oseltamivir (TAMIFLU) 30 MG capsule Take 1 capsule (30 mg total) by mouth 2 (two) times daily. For 5 days 10 capsule 0  . prochlorperazine (COMPAZINE) 10 MG tablet Take 1 tablet (10 mg total) by mouth every 6 (six) hours as needed for nausea or vomiting. 30 tablet 0  . zolpidem (AMBIEN) 5 MG tablet Take 1.5 mg by mouth at bedtime. Pt breaks tablet into thirds and takes 1/3 tablet nightly     No current facility-administered medications for this visit.      Allergies: No Known Allergies  Past Medical History, Surgical history, Social history, and Family History were reviewed and updated.  Physical Exam: Blood pressure (!) 148/78, pulse 91, temperature 98.4 F (36.9 C), temperature source Oral, resp. rate 18, height 5' 7"  (1.702 m), weight 144 lb 4.8 oz (65.5 kg), SpO2 100 %. ECOG: 0 General appearance: Alert, awake gentleman without distress. Head: Normocephalic, without obvious abnormality no oral ulcers or lesions. Neck: no adenopathy. No masses or lesions or discoloration. Lymph nodes: Cervical, supraclavicular, and axillary nodes normal. Heart:regular rate and rhythm, S1, S2 normal, no murmur, click, rub or gallop Lung:chest clear, no wheezing, rales, normal symmetric air entry Abdomin: soft, non-tender, without masses or organomegaly no shifting dullness or ascites. EXT:no erythema, induration, or nodules   Lab Results: Lab Results  Component Value Date   WBC 4.8 01/22/2017   HGB 13.9 01/22/2017   HCT 39.1 01/22/2017   MCV 83.7 01/22/2017   PLT 126 (L) 01/22/2017  Chemistry      Component Value Date/Time   NA 141 07/24/2016 1431   K 4.4 07/24/2016 1431   CL 99 09/18/2013 0501   CL 104 01/10/2013 1128   CO2 24 07/24/2016 1431   BUN 17.6 07/24/2016 1431   CREATININE 1.2 07/24/2016 1431      Component Value Date/Time   CALCIUM 9.9 07/24/2016 1431   ALKPHOS 131  07/24/2016 1431   AST 13 07/24/2016 1431   ALT 12 07/24/2016 1431   BILITOT 0.98 07/24/2016 1431       Impression and Plan:  76 year old gentleman with the following issues:  1.Stage III chronic lymphocytic leukemia. He presented with leukocytosis and lymphadenopathy at diagnosis. He also had Mild splenomegaly. He had a excellent response to FCR.   Physical examination laboratory data today do not suggest any evidence of relapse disease. The plan is to continue with active surveillance and repeat imaging studies as needed.  2. 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 Status post surgical resection in April 2009 without any evidence of recurrence. He is up-to-date on routine colonoscopies.  3. Thrombocytopenia: Remains at baseline at this time without any symptoms.  4. Nausea: Prescription for Compazine made available to the patient which she uses very infrequently.  5. Followup: Will be in 6 months.   [name], MD 4/27/20181:30 PM

## 2017-05-17 ENCOUNTER — Other Ambulatory Visit: Payer: Self-pay | Admitting: *Deleted

## 2017-05-17 MED ORDER — PROCHLORPERAZINE MALEATE 10 MG PO TABS: 10.0000 mg | ORAL_TABLET | Freq: Four times a day (QID) | ORAL | 1 refills | Status: DC | PRN

## 2017-06-14 DIAGNOSIS — Z980 Intestinal bypass and anastomosis status: Secondary | ICD-10-CM | POA: Diagnosis not present

## 2017-06-14 DIAGNOSIS — Z85038 Personal history of other malignant neoplasm of large intestine: Secondary | ICD-10-CM | POA: Diagnosis not present

## 2017-07-13 ENCOUNTER — Other Ambulatory Visit (HOSPITAL_BASED_OUTPATIENT_CLINIC_OR_DEPARTMENT_OTHER): Payer: Medicare Other

## 2017-07-13 ENCOUNTER — Telehealth: Payer: Self-pay | Admitting: Oncology

## 2017-07-13 ENCOUNTER — Ambulatory Visit (HOSPITAL_BASED_OUTPATIENT_CLINIC_OR_DEPARTMENT_OTHER): Payer: Medicare Other | Admitting: Oncology

## 2017-07-13 VITALS — BP 154/70 | HR 86 | Temp 98.7°F | Resp 18 | Ht 67.0 in | Wt 143.1 lb

## 2017-07-13 DIAGNOSIS — Z8579 Personal history of other malignant neoplasms of lymphoid, hematopoietic and related tissues: Secondary | ICD-10-CM | POA: Diagnosis not present

## 2017-07-13 DIAGNOSIS — C9110 Chronic lymphocytic leukemia of B-cell type not having achieved remission: Secondary | ICD-10-CM

## 2017-07-13 DIAGNOSIS — Z85038 Personal history of other malignant neoplasm of large intestine: Secondary | ICD-10-CM

## 2017-07-13 LAB — COMPREHENSIVE METABOLIC PANEL
ALT: 19 U/L (ref 0–55)
AST: 17 U/L (ref 5–34)
Albumin: 4 g/dL (ref 3.5–5.0)
Alkaline Phosphatase: 121 U/L (ref 40–150)
Anion Gap: 9 mEq/L (ref 3–11)
BUN: 18.9 mg/dL (ref 7.0–26.0)
CO2: 24 mEq/L (ref 22–29)
Calcium: 9.5 mg/dL (ref 8.4–10.4)
Chloride: 108 mEq/L (ref 98–109)
Creatinine: 1.2 mg/dL (ref 0.7–1.3)
EGFR: >60 mL/min/{1.73_m2} (ref >60)
Glucose: 111 mg/dl (ref 70–140)
Potassium: 4.6 mEq/L (ref 3.5–5.1)
Sodium: 140 mEq/L (ref 136–145)
Total Bilirubin: 0.6 mg/dL (ref 0.20–1.20)
Total Protein: 7 g/dL (ref 6.4–8.3)

## 2017-07-13 LAB — CBC WITH DIFFERENTIAL/PLATELET
BASO%: 0.8 % (ref 0.0–2.0)
Basophils Absolute: 0 10*3/uL (ref 0.0–0.1)
EOS%: 4.6 % (ref 0.0–7.0)
Eosinophils Absolute: 0.2 10*3/uL (ref 0.0–0.5)
HCT: 37.9 % — ABNORMAL LOW (ref 38.4–49.9)
HGB: 13 g/dL (ref 13.0–17.1)
LYMPH%: 32.9 % (ref 14.0–49.0)
MCH: 29.6 pg (ref 27.2–33.4)
MCHC: 34.4 g/dL (ref 32.0–36.0)
MCV: 86 fL (ref 79.3–98.0)
MONO#: 0.5 10*3/uL (ref 0.1–0.9)
MONO%: 10.9 % (ref 0.0–14.0)
NEUT#: 2.5 10*3/uL (ref 1.5–6.5)
NEUT%: 50.8 % (ref 39.0–75.0)
Platelets: 161 10*3/uL (ref 140–400)
RBC: 4.4 10*6/uL (ref 4.20–5.82)
RDW: 13.5 % (ref 11.0–14.6)
WBC: 4.8 10*3/uL (ref 4.0–10.3)
lymph#: 1.6 10*3/uL (ref 0.9–3.3)

## 2017-07-13 NOTE — Progress Notes (Signed)
Hematology and Oncology Follow Up Visit  [name] [id number] 03-18-1941 76 y.o. 07/13/2017 3:31 PM [name], [name], MD   Principle Diagnosis: 76 year old gentleman with the following diagnoses:  1. T2 N0 rectal cancer diagnosed in April of 2009. Status post low anterior resection on 01/09/2008. He was found to have a poorly differentiated adenocarcinoma without any lymph node involvement.  2. CLL diagnosed in August of 2010 presented with lymphocytosis and a bone marrow biopsy confirmed the presence of CLL.   Prior Therapy: He is status post FCR chemotherapy completed on on April of 2013. He has been in CR since that time.  Current therapy: Observation and surveillance.  Interim History: Mr. Burgard presents today for a followup visit. Since the last visit, he reports no no recent complaints. He [place] and able to enjoy it without complications. His appetite is about same and his weight is unchanged. He has not reported any lymphadenopathy or constitutional symptoms. He did not report any petechiae or easy bruisability. He denied any recent infections or hospitalizations.   He is not reporting any headaches blurred vision double vision. Has not reported any fevers or chills or sweats. As that report any abdominal pain or early satiety. Has not reported any hematochezia or melena. Has not reported any genitourinary complaints of frequency urgency or hesitancy. He has not reported any musculoskeletal complaints. Is not reporting any arthralgias or myalgias. He continue to be very active and with excellent performance status and quality of life. Rest of his review of systems unremarkable.  Medications: I have reviewed the patient's current medications.  Current Outpatient Prescriptions  Medication Sig Dispense Refill  . aspirin 81 MG tablet Take 81 mg by mouth every morning.     Marland Kitchen atorvastatin (LIPITOR) 40 MG tablet Take 40 mg by mouth every  evening.     Marland Kitchen GAVILYTE-N WITH FLAVOR PACK 420 g solution See admin instructions.  0  . metFORMIN (GLUCOPHAGE) 1000 MG tablet Take 1,000 mg by mouth 2 (two) times daily with a meal.  0  . oseltamivir (TAMIFLU) 30 MG capsule Take 1 capsule (30 mg total) by mouth 2 (two) times daily. For 5 days 10 capsule 0  . prochlorperazine (COMPAZINE) 10 MG tablet Take 1 tablet (10 mg total) by mouth every 6 (six) hours as needed for nausea or vomiting. 30 tablet 1  . zolpidem (AMBIEN) 10 MG tablet TAKE 1/2-1 TABLET BY MOUTH AT BEDTIME, AS NEEDED FOR INSOMNIA  3   No current facility-administered medications for this visit.      Allergies: No Known Allergies  Past Medical History, Surgical history, Social history, and Family History were reviewed and updated.  Physical Exam: Blood pressure (!) 154/70, pulse 86, temperature 98.7 F (37.1 C), temperature source Oral, resp. rate 18, height _0  (1.702 m), weight 143 lb 1.6 oz (64.9 kg), SpO2 100 %. ECOG: 0 General appearance: well-appearing gentleman without distress.. Head: Normocephalic, without obvious abnormality no oral ulcers or lesions. Neck: no adenopathy. No masses or lesions or discoloration. Lymph nodes: Cervical, supraclavicular, and axillary nodes normal. Heart:regular rate and rhythm, S1, S2 normal, no murmur, click, rub or gallop Lung:chest clear, no wheezing, rales, normal symmetric air entry Abdomin: soft, non-tender, without masses or organomegaly no rebound or guarding EXT:no erythema, induration, or nodules   Lab Results: Lab Results  Component Value Date   WBC 4.8 07/13/2017   HGB 13.0 07/13/2017   HCT 37.9 (L) 07/13/2017   MCV 86.0 07/13/2017  PLT 161 07/13/2017     Chemistry      Component Value Date/Time   NA 141 01/22/2017 1312   K 4.3 01/22/2017 1312   CL 99 09/18/2013 0501   CL 104 01/10/2013 1128   CO2 27 01/22/2017 1312   BUN 18.1 01/22/2017 1312   CREATININE 1.3 01/22/2017 1312      Component Value  Date/Time   CALCIUM 9.8 01/22/2017 1312   ALKPHOS 131 01/22/2017 1312   AST 13 01/22/2017 1312   ALT 12 01/22/2017 1312   BILITOT 1.05 01/22/2017 1312       Impression and Plan:  76 year old gentleman with the following issues:  1.Stage III chronic lymphocytic leukemia. He presented with leukocytosis and lymphadenopathy at diagnosis. He also had Mild splenomegaly. He had a excellent response to FCR.   Physical examination laboratory data today continues to show normal findings without evidence of relapse disease.  The plan is to continue with active surveillance and repeat imaging studies for develops any symptoms.  2. 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 Status post surgical resection in April 2009 without any evidence of recurrence. He is up-to-date on routine colonoscopies.  3. Thrombocytopenia: is platelet count is within normal range.  4. Nausea: Does not appear to be a problem at this time.  5. Followup: Will be in 6 months.   [name], MD 10/16/20183:31 PM

## 2017-07-13 NOTE — Telephone Encounter (Signed)
Gave avs and calendar for April 2019 °

## 2017-07-21 DIAGNOSIS — I10 Essential (primary) hypertension: Secondary | ICD-10-CM | POA: Diagnosis not present

## 2017-07-21 DIAGNOSIS — C9110 Chronic lymphocytic leukemia of B-cell type not having achieved remission: Secondary | ICD-10-CM | POA: Diagnosis not present

## 2017-07-21 DIAGNOSIS — E7800 Pure hypercholesterolemia, unspecified: Secondary | ICD-10-CM | POA: Diagnosis not present

## 2017-07-21 DIAGNOSIS — E119 Type 2 diabetes mellitus without complications: Secondary | ICD-10-CM | POA: Diagnosis not present

## 2017-07-21 DIAGNOSIS — Z0000 Encounter for general adult medical examination without abnormal findings: Secondary | ICD-10-CM | POA: Diagnosis not present

## 2017-07-21 DIAGNOSIS — Z1389 Encounter for screening for other disorder: Secondary | ICD-10-CM | POA: Diagnosis not present

## 2017-07-21 DIAGNOSIS — Z7984 Long term (current) use of oral hypoglycemic drugs: Secondary | ICD-10-CM | POA: Diagnosis not present

## 2017-07-21 DIAGNOSIS — Z125 Encounter for screening for malignant neoplasm of prostate: Secondary | ICD-10-CM | POA: Diagnosis not present

## 2017-07-21 DIAGNOSIS — D696 Thrombocytopenia, unspecified: Secondary | ICD-10-CM | POA: Diagnosis not present

## 2017-07-21 DIAGNOSIS — Z6822 Body mass index (BMI) 22.0-22.9, adult: Secondary | ICD-10-CM | POA: Diagnosis not present

## 2017-07-21 DIAGNOSIS — Z23 Encounter for immunization: Secondary | ICD-10-CM | POA: Diagnosis not present

## 2017-07-21 DIAGNOSIS — G4700 Insomnia, unspecified: Secondary | ICD-10-CM | POA: Diagnosis not present

## 2017-10-07 DIAGNOSIS — H9319 Tinnitus, unspecified ear: Secondary | ICD-10-CM | POA: Diagnosis not present

## 2017-10-07 DIAGNOSIS — H9193 Unspecified hearing loss, bilateral: Secondary | ICD-10-CM | POA: Diagnosis not present

## 2017-10-07 DIAGNOSIS — I10 Essential (primary) hypertension: Secondary | ICD-10-CM | POA: Diagnosis not present

## 2017-10-12 DIAGNOSIS — N289 Disorder of kidney and ureter, unspecified: Secondary | ICD-10-CM | POA: Diagnosis not present

## 2017-10-12 DIAGNOSIS — H9190 Unspecified hearing loss, unspecified ear: Secondary | ICD-10-CM | POA: Diagnosis not present

## 2017-10-12 DIAGNOSIS — I10 Essential (primary) hypertension: Secondary | ICD-10-CM | POA: Diagnosis not present

## 2017-10-15 DIAGNOSIS — H9191 Unspecified hearing loss, right ear: Secondary | ICD-10-CM | POA: Diagnosis not present

## 2017-10-15 DIAGNOSIS — H9319 Tinnitus, unspecified ear: Secondary | ICD-10-CM | POA: Diagnosis not present

## 2017-10-15 DIAGNOSIS — H903 Sensorineural hearing loss, bilateral: Secondary | ICD-10-CM | POA: Diagnosis not present

## 2017-10-22 DIAGNOSIS — H9313 Tinnitus, bilateral: Secondary | ICD-10-CM | POA: Diagnosis not present

## 2017-10-22 DIAGNOSIS — H903 Sensorineural hearing loss, bilateral: Secondary | ICD-10-CM | POA: Diagnosis not present

## 2017-10-26 DIAGNOSIS — M5031 Other cervical disc degeneration,  high cervical region: Secondary | ICD-10-CM | POA: Diagnosis not present

## 2017-10-26 DIAGNOSIS — M9901 Segmental and somatic dysfunction of cervical region: Secondary | ICD-10-CM | POA: Diagnosis not present

## 2017-10-27 DIAGNOSIS — M9901 Segmental and somatic dysfunction of cervical region: Secondary | ICD-10-CM | POA: Diagnosis not present

## 2017-10-27 DIAGNOSIS — M5031 Other cervical disc degeneration,  high cervical region: Secondary | ICD-10-CM | POA: Diagnosis not present

## 2017-10-29 DIAGNOSIS — M5031 Other cervical disc degeneration,  high cervical region: Secondary | ICD-10-CM | POA: Diagnosis not present

## 2017-10-29 DIAGNOSIS — M9901 Segmental and somatic dysfunction of cervical region: Secondary | ICD-10-CM | POA: Diagnosis not present

## 2017-11-01 DIAGNOSIS — M9901 Segmental and somatic dysfunction of cervical region: Secondary | ICD-10-CM | POA: Diagnosis not present

## 2017-11-01 DIAGNOSIS — M5031 Other cervical disc degeneration,  high cervical region: Secondary | ICD-10-CM | POA: Diagnosis not present

## 2017-11-04 DIAGNOSIS — M5031 Other cervical disc degeneration,  high cervical region: Secondary | ICD-10-CM | POA: Diagnosis not present

## 2017-11-04 DIAGNOSIS — M9901 Segmental and somatic dysfunction of cervical region: Secondary | ICD-10-CM | POA: Diagnosis not present

## 2017-11-08 DIAGNOSIS — M5031 Other cervical disc degeneration,  high cervical region: Secondary | ICD-10-CM | POA: Diagnosis not present

## 2017-11-08 DIAGNOSIS — M9901 Segmental and somatic dysfunction of cervical region: Secondary | ICD-10-CM | POA: Diagnosis not present

## 2017-11-16 DIAGNOSIS — M5031 Other cervical disc degeneration,  high cervical region: Secondary | ICD-10-CM | POA: Diagnosis not present

## 2017-11-16 DIAGNOSIS — M9901 Segmental and somatic dysfunction of cervical region: Secondary | ICD-10-CM | POA: Diagnosis not present

## 2017-11-22 DIAGNOSIS — M9901 Segmental and somatic dysfunction of cervical region: Secondary | ICD-10-CM | POA: Diagnosis not present

## 2017-11-22 DIAGNOSIS — M5031 Other cervical disc degeneration,  high cervical region: Secondary | ICD-10-CM | POA: Diagnosis not present

## 2017-11-29 DIAGNOSIS — M5031 Other cervical disc degeneration,  high cervical region: Secondary | ICD-10-CM | POA: Diagnosis not present

## 2017-11-29 DIAGNOSIS — M9901 Segmental and somatic dysfunction of cervical region: Secondary | ICD-10-CM | POA: Diagnosis not present

## 2017-12-06 DIAGNOSIS — M5031 Other cervical disc degeneration,  high cervical region: Secondary | ICD-10-CM | POA: Diagnosis not present

## 2017-12-06 DIAGNOSIS — M9901 Segmental and somatic dysfunction of cervical region: Secondary | ICD-10-CM | POA: Diagnosis not present

## 2017-12-13 DIAGNOSIS — M5031 Other cervical disc degeneration,  high cervical region: Secondary | ICD-10-CM | POA: Diagnosis not present

## 2017-12-13 DIAGNOSIS — M9901 Segmental and somatic dysfunction of cervical region: Secondary | ICD-10-CM | POA: Diagnosis not present

## 2017-12-20 DIAGNOSIS — M9901 Segmental and somatic dysfunction of cervical region: Secondary | ICD-10-CM | POA: Diagnosis not present

## 2017-12-20 DIAGNOSIS — M5031 Other cervical disc degeneration,  high cervical region: Secondary | ICD-10-CM | POA: Diagnosis not present

## 2017-12-27 DIAGNOSIS — M9901 Segmental and somatic dysfunction of cervical region: Secondary | ICD-10-CM | POA: Diagnosis not present

## 2017-12-27 DIAGNOSIS — M5031 Other cervical disc degeneration,  high cervical region: Secondary | ICD-10-CM | POA: Diagnosis not present

## 2018-01-04 DIAGNOSIS — M5031 Other cervical disc degeneration,  high cervical region: Secondary | ICD-10-CM | POA: Diagnosis not present

## 2018-01-04 DIAGNOSIS — M9901 Segmental and somatic dysfunction of cervical region: Secondary | ICD-10-CM | POA: Diagnosis not present

## 2018-01-11 ENCOUNTER — Inpatient Hospital Stay: Payer: Medicare Other | Attending: Oncology | Admitting: Oncology

## 2018-01-11 ENCOUNTER — Inpatient Hospital Stay: Payer: Medicare Other

## 2018-01-11 ENCOUNTER — Telehealth: Payer: Self-pay | Admitting: Oncology

## 2018-01-11 VITALS — BP 148/70 | HR 89 | Temp 97.7°F | Resp 18 | Ht 67.0 in | Wt 140.6 lb

## 2018-01-11 DIAGNOSIS — R109 Unspecified abdominal pain: Secondary | ICD-10-CM | POA: Insufficient documentation

## 2018-01-11 DIAGNOSIS — Z85048 Personal history of other malignant neoplasm of rectum, rectosigmoid junction, and anus: Secondary | ICD-10-CM | POA: Insufficient documentation

## 2018-01-11 DIAGNOSIS — D6959 Other secondary thrombocytopenia: Secondary | ICD-10-CM | POA: Insufficient documentation

## 2018-01-11 DIAGNOSIS — C9110 Chronic lymphocytic leukemia of B-cell type not having achieved remission: Secondary | ICD-10-CM | POA: Insufficient documentation

## 2018-01-11 LAB — COMPREHENSIVE METABOLIC PANEL
ALT: 14 U/L (ref 0–55)
AST: 13 U/L (ref 5–34)
Albumin: 4.2 g/dL (ref 3.5–5.0)
Alkaline Phosphatase: 117 U/L (ref 40–150)
Anion gap: 8 (ref 3–11)
BUN: 17 mg/dL (ref 7–26)
CO2: 26 mmol/L (ref 22–29)
Calcium: 10.1 mg/dL (ref 8.4–10.4)
Chloride: 106 mmol/L (ref 98–109)
Creatinine, Ser: 1.34 mg/dL — ABNORMAL HIGH (ref 0.70–1.30)
GFR calc Af Amer: 58 mL/min — ABNORMAL LOW (ref >60)
GFR calc non Af Amer: 50 mL/min — ABNORMAL LOW (ref >60)
Glucose, Bld: 190 mg/dL — ABNORMAL HIGH (ref 70–140)
Potassium: 4.7 mmol/L (ref 3.5–5.1)
Sodium: 140 mmol/L (ref 136–145)
Total Bilirubin: 0.9 mg/dL (ref 0.2–1.2)
Total Protein: 7.2 g/dL (ref 6.4–8.3)

## 2018-01-11 LAB — CBC WITH DIFFERENTIAL/PLATELET
Basophils Absolute: 0 10*3/uL (ref 0.0–0.1)
Basophils Relative: 0 %
Eosinophils Absolute: 0.1 10*3/uL (ref 0.0–0.5)
Eosinophils Relative: 3 %
HCT: 39.5 % (ref 38.4–49.9)
Hemoglobin: 13.6 g/dL (ref 13.0–17.1)
Lymphocytes Relative: 40 %
Lymphs Abs: 2 10*3/uL (ref 0.9–3.3)
MCH: 29.8 pg (ref 27.2–33.4)
MCHC: 34.4 g/dL (ref 32.0–36.0)
MCV: 86.4 fL (ref 79.3–98.0)
Monocytes Absolute: 0.4 10*3/uL (ref 0.1–0.9)
Monocytes Relative: 9 %
Neutro Abs: 2.4 10*3/uL (ref 1.5–6.5)
Neutrophils Relative %: 48 %
Platelets: 114 10*3/uL — ABNORMAL LOW (ref 140–400)
RBC: 4.57 MIL/uL (ref 4.20–5.82)
RDW: 13 % (ref 11.0–14.6)
WBC: 4.9 10*3/uL (ref 4.0–10.3)

## 2018-01-11 NOTE — Progress Notes (Signed)
Hematology and Oncology Follow Up Visit  [name] [id number] 03-May-1941 77 y.o. 01/11/2018 1:23 PM [name], [name], MD   Principle Diagnosis: 77 year old man with:   1. 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  He remains a disease free at this time.  2. CLL diagnosed in August of 2010 presented with lymphocytosis and a bone marrow biopsy confirmed the presence of CLL.   Prior Therapy: He is status post FCR chemotherapy completed on on April of 2013 and achieved complete response since that time.  Current therapy: Active surveillance.  Interim History: [name] is here for a follow-up.  He reports no major changes in his health since last visit.  He remains active and attends to activities of daily living.  He does report left-sided groin pain that is been noticeable in the last few months.  He denies any recent trauma or falls.  He denies lifting any heavy objects.  He does report bulging at times that this appears periodically.  He is not taking any pain medication or needed to.  He denied any neurological deficits.  He denies any constitutional symptoms or hospitalizations.  He denied any headaches blurred vision double vision.  He denies any fevers, chills or sweats.  He denies any chest pain, palpitation, orthopnea or leg edema.  He denied any cough, wheezing or hemoptysis.  He does not report any nausea, vomiting or abdominal pain.  Has not reported any genitourinary complaints of frequency urgency or hesitancy. He has not reported any musculoskeletal complaint.  He denies any skin rashes or lesions.  He does not report any lymphadenopathy or petechiae.  Rest of his review of systems is negative.  Medications: I have reviewed the patient's current medications.  Current Outpatient Medications  Medication Sig Dispense Refill  . aspirin 81 MG tablet Take 81 mg by mouth every morning.     Marland Kitchen atorvastatin (LIPITOR) 40 MG  tablet Take 40 mg by mouth every evening.     Marland Kitchen GAVILYTE-N WITH FLAVOR PACK 420 g solution See admin instructions.  0  . metFORMIN (GLUCOPHAGE) 1000 MG tablet Take 1,000 mg by mouth 2 (two) times daily with a meal.  0  . oseltamivir (TAMIFLU) 30 MG capsule Take 1 capsule (30 mg total) by mouth 2 (two) times daily. For 5 days 10 capsule 0  . prochlorperazine (COMPAZINE) 10 MG tablet Take 1 tablet (10 mg total) by mouth every 6 (six) hours as needed for nausea or vomiting. 30 tablet 1  . zolpidem (AMBIEN) 10 MG tablet TAKE 1/2-1 TABLET BY MOUTH AT BEDTIME, AS NEEDED FOR INSOMNIA  3   No current facility-administered medications for this visit.      Allergies: No Known Allergies  Past Medical History, Surgical history, Social history, and Family History   Physical Exam:  ECOG: 0 General appearance: Alert, awake gentleman without distress. Head: Atraumatic without any abnormalities. Oropharynx: No thrush or ulcers. Eyes: No scleral icterus. Lymph nodes: No lymphadenopathy palpated in the cervical, axillary or supraclavicular regions. Heart: Regular rate and rhythm without any murmurs rubs or gallops. Lung: Clear to auscultation without any rhonchi, wheezes or dullness to percussion. Abdomin: Soft, nondistended without any rebound or guarding.  Good bowel sounds.  No masses palpated in his inguinal area. Skin: No rashes or lesions. Musculoskeletal: No joint deformity or effusion.  Full range of motion noted in all his joints. Neurological: No motor or sensory deficits.   Lab Results: Lab Results  Component Value Date   WBC 4.8 07/13/2017   HGB 13.0 07/13/2017   HCT 37.9 (L) 07/13/2017   MCV 86.0 07/13/2017   PLT 161 07/13/2017     Chemistry      Component Value Date/Time   NA 140 07/13/2017 1456   K 4.6 07/13/2017 1456   CL 99 09/18/2013 0501   CL 104 01/10/2013 1128   CO2 24 07/13/2017 1456   BUN 18.9 07/13/2017 1456   CREATININE 1.2 07/13/2017 1456      Component Value  Date/Time   CALCIUM 9.5 07/13/2017 1456   ALKPHOS 121 07/13/2017 1456   AST 17 07/13/2017 1456   ALT 19 07/13/2017 1456   BILITOT 0.60 07/13/2017 1456       Impression and Plan:  77 year old gentleman with the following issues:  1. Chronic lymphocytic leukemia diagnosed in 2009 after presenting with lymphocytosis and lymphadenopathy.  He remains in remission since 2013 after being treated with FCR chemotherapy.  The natural course of this disease was reviewed today with the patient and his wife.  His physical examination and laboratory data do not suggest any relapse of his disease.  I recommended continued active surveillance at this time and repeat physical exam and laboratory testing in 6 months.  2. Stage II of the rectum: He has no evidence of recurrent disease since his surgery that was completed in 2009.  He is up-to-date on colonoscopy and active surveillance.  3. Thrombocytopenia: Related to treated CLL.  Platelet count continues to fluctuate and no intervention is needed.  Active surveillance is recommended.  4.  Abdominal discomfort: Likely related to a muscle pull versus hernia.  I have asked him to monitor this and bring it to the attention of his primary care physician if it worsens in the future.  5. Followup: Will be in 6 months.  15  minutes was spent with the patient face-to-face today.  More than 50% of time was dedicated to patient counseling, education and coordination of his care.    [name], MD 4/16/20191:23 PM

## 2018-01-11 NOTE — Telephone Encounter (Signed)
Appointments scheduled AVS/Calendar printed per 4/16 los °

## 2018-01-12 DIAGNOSIS — M9901 Segmental and somatic dysfunction of cervical region: Secondary | ICD-10-CM | POA: Diagnosis not present

## 2018-01-12 DIAGNOSIS — M5031 Other cervical disc degeneration,  high cervical region: Secondary | ICD-10-CM | POA: Diagnosis not present

## 2018-01-18 DIAGNOSIS — M9901 Segmental and somatic dysfunction of cervical region: Secondary | ICD-10-CM | POA: Diagnosis not present

## 2018-01-18 DIAGNOSIS — M5031 Other cervical disc degeneration,  high cervical region: Secondary | ICD-10-CM | POA: Diagnosis not present

## 2018-01-25 DIAGNOSIS — M5031 Other cervical disc degeneration,  high cervical region: Secondary | ICD-10-CM | POA: Diagnosis not present

## 2018-01-25 DIAGNOSIS — M9901 Segmental and somatic dysfunction of cervical region: Secondary | ICD-10-CM | POA: Diagnosis not present

## 2018-01-27 DIAGNOSIS — C9110 Chronic lymphocytic leukemia of B-cell type not having achieved remission: Secondary | ICD-10-CM | POA: Diagnosis not present

## 2018-01-27 DIAGNOSIS — E7800 Pure hypercholesterolemia, unspecified: Secondary | ICD-10-CM | POA: Diagnosis not present

## 2018-01-27 DIAGNOSIS — D696 Thrombocytopenia, unspecified: Secondary | ICD-10-CM | POA: Diagnosis not present

## 2018-01-27 DIAGNOSIS — Z6822 Body mass index (BMI) 22.0-22.9, adult: Secondary | ICD-10-CM | POA: Diagnosis not present

## 2018-01-27 DIAGNOSIS — E1169 Type 2 diabetes mellitus with other specified complication: Secondary | ICD-10-CM | POA: Diagnosis not present

## 2018-01-27 DIAGNOSIS — I10 Essential (primary) hypertension: Secondary | ICD-10-CM | POA: Diagnosis not present

## 2018-01-27 DIAGNOSIS — Z7984 Long term (current) use of oral hypoglycemic drugs: Secondary | ICD-10-CM | POA: Diagnosis not present

## 2018-01-27 DIAGNOSIS — R102 Pelvic and perineal pain: Secondary | ICD-10-CM | POA: Diagnosis not present

## 2018-01-27 DIAGNOSIS — G4700 Insomnia, unspecified: Secondary | ICD-10-CM | POA: Diagnosis not present

## 2018-01-28 ENCOUNTER — Other Ambulatory Visit: Payer: Self-pay | Admitting: Family Medicine

## 2018-01-28 DIAGNOSIS — R102 Pelvic and perineal pain: Secondary | ICD-10-CM

## 2018-02-01 DIAGNOSIS — M5031 Other cervical disc degeneration,  high cervical region: Secondary | ICD-10-CM | POA: Diagnosis not present

## 2018-02-01 DIAGNOSIS — M9901 Segmental and somatic dysfunction of cervical region: Secondary | ICD-10-CM | POA: Diagnosis not present

## 2018-02-03 ENCOUNTER — Ambulatory Visit
Admission: RE | Admit: 2018-02-03 | Discharge: 2018-02-03 | Disposition: A | Discharge status: Home / Self Care | Payer: Medicare Other | Source: Ambulatory Visit | Attending: Family Medicine | Admitting: Family Medicine

## 2018-02-03 DIAGNOSIS — R102 Pelvic and perineal pain: Secondary | ICD-10-CM | POA: Diagnosis not present

## 2018-02-03 MED ORDER — IOPAMIDOL (ISOVUE-300) INJECTION 61%
100.0000 mL | Freq: Once | INTRAVENOUS | Status: AC | PRN
  Administered 2018-02-03: 100 mL via INTRAVENOUS

## 2018-02-24 DIAGNOSIS — Z85038 Personal history of other malignant neoplasm of large intestine: Secondary | ICD-10-CM | POA: Diagnosis not present

## 2018-02-24 DIAGNOSIS — R1032 Left lower quadrant pain: Secondary | ICD-10-CM | POA: Diagnosis not present

## 2018-02-24 DIAGNOSIS — C9110 Chronic lymphocytic leukemia of B-cell type not having achieved remission: Secondary | ICD-10-CM | POA: Diagnosis not present

## 2018-04-27 DIAGNOSIS — N50812 Left testicular pain: Secondary | ICD-10-CM | POA: Diagnosis not present

## 2018-04-27 DIAGNOSIS — E119 Type 2 diabetes mellitus without complications: Secondary | ICD-10-CM | POA: Diagnosis not present

## 2018-04-27 DIAGNOSIS — I10 Essential (primary) hypertension: Secondary | ICD-10-CM | POA: Diagnosis not present

## 2018-04-27 DIAGNOSIS — R1032 Left lower quadrant pain: Secondary | ICD-10-CM | POA: Diagnosis not present

## 2018-04-27 DIAGNOSIS — Z85048 Personal history of other malignant neoplasm of rectum, rectosigmoid junction, and anus: Secondary | ICD-10-CM | POA: Diagnosis not present

## 2018-04-27 DIAGNOSIS — Z856 Personal history of leukemia: Secondary | ICD-10-CM | POA: Diagnosis not present

## 2018-07-13 ENCOUNTER — Inpatient Hospital Stay (HOSPITAL_BASED_OUTPATIENT_CLINIC_OR_DEPARTMENT_OTHER): Payer: Medicare Other | Admitting: Oncology

## 2018-07-13 ENCOUNTER — Inpatient Hospital Stay: Payer: Medicare Other | Attending: Oncology

## 2018-07-13 ENCOUNTER — Telehealth: Payer: Self-pay | Admitting: Oncology

## 2018-07-13 VITALS — BP 141/70 | HR 113 | Temp 97.9°F | Resp 18 | Ht 67.0 in | Wt 139.8 lb

## 2018-07-13 DIAGNOSIS — C9110 Chronic lymphocytic leukemia of B-cell type not having achieved remission: Secondary | ICD-10-CM

## 2018-07-13 DIAGNOSIS — Z79899 Other long term (current) drug therapy: Secondary | ICD-10-CM | POA: Diagnosis not present

## 2018-07-13 DIAGNOSIS — Z85048 Personal history of other malignant neoplasm of rectum, rectosigmoid junction, and anus: Secondary | ICD-10-CM | POA: Insufficient documentation

## 2018-07-13 DIAGNOSIS — D696 Thrombocytopenia, unspecified: Secondary | ICD-10-CM

## 2018-07-13 DIAGNOSIS — C9111 Chronic lymphocytic leukemia of B-cell type in remission: Secondary | ICD-10-CM | POA: Insufficient documentation

## 2018-07-13 LAB — CBC WITH DIFFERENTIAL (CANCER CENTER ONLY)
Abs Immature Granulocytes: 0.02 10*3/uL (ref 0.00–0.07)
Basophils Absolute: 0 10*3/uL (ref 0.0–0.1)
Basophils Relative: 0 %
Eosinophils Absolute: 0.2 10*3/uL (ref 0.0–0.5)
Eosinophils Relative: 3 %
HCT: 40.2 % (ref 39.0–52.0)
Hemoglobin: 13.5 g/dL (ref 13.0–17.0)
Immature Granulocytes: 0 %
Lymphocytes Relative: 41 %
Lymphs Abs: 2.7 10*3/uL (ref 0.7–4.0)
MCH: 28.7 pg (ref 26.0–34.0)
MCHC: 33.6 g/dL (ref 30.0–36.0)
MCV: 85.5 fL (ref 80.0–100.0)
Monocytes Absolute: 0.5 10*3/uL (ref 0.1–1.0)
Monocytes Relative: 7 %
Neutro Abs: 3.2 10*3/uL (ref 1.7–7.7)
Neutrophils Relative %: 49 %
Platelet Count: 132 10*3/uL — ABNORMAL LOW (ref 150–400)
RBC: 4.7 MIL/uL (ref 4.22–5.81)
RDW: 13 % (ref 11.5–15.5)
WBC Count: 6.6 10*3/uL (ref 4.0–10.5)
nRBC: 0 % (ref 0.0–0.2)

## 2018-07-13 LAB — CMP (CANCER CENTER ONLY)
ALT: 12 U/L (ref 0–44)
AST: 13 U/L — ABNORMAL LOW (ref 15–41)
Albumin: 4.4 g/dL (ref 3.5–5.0)
Alkaline Phosphatase: 127 U/L — ABNORMAL HIGH (ref 38–126)
Anion gap: 13 (ref 5–15)
BUN: 18 mg/dL (ref 8–23)
CO2: 26 mmol/L (ref 22–32)
Calcium: 10 mg/dL (ref 8.9–10.3)
Chloride: 105 mmol/L (ref 98–111)
Creatinine: 1.38 mg/dL — ABNORMAL HIGH (ref 0.61–1.24)
GFR, Est AFR Am: 56 mL/min — ABNORMAL LOW (ref >60)
GFR, Estimated: 48 mL/min — ABNORMAL LOW (ref >60)
Glucose, Bld: 151 mg/dL — ABNORMAL HIGH (ref 70–99)
Potassium: 4.2 mmol/L (ref 3.5–5.1)
Sodium: 144 mmol/L (ref 135–145)
Total Bilirubin: 1.5 mg/dL — ABNORMAL HIGH (ref 0.3–1.2)
Total Protein: 7.4 g/dL (ref 6.5–8.1)

## 2018-07-13 NOTE — Telephone Encounter (Signed)
Scheduled appt per 10/169 los - gave patient AVS and calender per los.

## 2018-07-13 NOTE — Progress Notes (Signed)
Hematology and Oncology Follow Up Visit  [name] [id number] 1941-03-22 77 y.o. 07/13/2018 11:44 AM [name], [name], MD   Principle Diagnosis: 77 year old man with:   1.  Rectal cancer diagnosed in April 2009.  He was found to have T2 N0 adenocarcinoma without any evidence of recurrent disease since that time.  2. CLL diagnosed in August of 2010 presented with lymphocytosis and a bone marrow biopsy confirmed the presence of CLL.   Prior Therapy: He is status post FCR chemotherapy completed on on April of 2013 and accomplished complete response and remains in remission at this time.  Current therapy: Active surveillance.  Interim History: [name] returns today for a follow-up visit.  Since her last visit, he reports no major changes in his health.  He denies any abdominal distention or discomfort.  He denies any early satiety.  His appetite has not increased since the last visit and of lost few pounds.  He feels well physically but his wife reports that he is an active and spends most of the day watching television.  He denies any painful adenopathy or any constitutional symptoms.  He denied any headaches blurred vision double vision.  He denies any alteration mental status or confusion.  He denies any fevers, chills or sweats.  He denies any chest pain, palpitation, orthopnea or leg edema.  He denied any cough, wheezing or hemoptysis.  He does not report any nausea, vomiting or abdominal pain.  He denies any changes in bowel habits.  Has not reported any frequency urgency or hesitancy. He has not reported any musculoskeletal complaint.  He denies any skin rashes or lesions.  He does not report any bleeding or clotting tendency.  Rest of his review of systems is negative.  Medications: I have reviewed the patient's current medications.  Current Outpatient Medications  Medication Sig Dispense Refill  . aspirin 81 MG tablet Take 81 mg by mouth every morning.     Marland Kitchen  atorvastatin (LIPITOR) 40 MG tablet Take 40 mg by mouth every evening.     Marland Kitchen GAVILYTE-N WITH FLAVOR PACK 420 g solution See admin instructions.  0  . metFORMIN (GLUCOPHAGE) 1000 MG tablet Take 1,000 mg by mouth 2 (two) times daily with a meal.  0  . oseltamivir (TAMIFLU) 30 MG capsule Take 1 capsule (30 mg total) by mouth 2 (two) times daily. For 5 days 10 capsule 0  . prochlorperazine (COMPAZINE) 10 MG tablet Take 1 tablet (10 mg total) by mouth every 6 (six) hours as needed for nausea or vomiting. 30 tablet 1  . zolpidem (AMBIEN) 10 MG tablet TAKE 1/2-1 TABLET BY MOUTH AT BEDTIME, AS NEEDED FOR INSOMNIA  3   No current facility-administered medications for this visit.      Allergies: No Known Allergies  Past Medical History, Surgical history, Social history, and Family History   Physical Exam:  ECOG: 0   General appearance: Comfortable appearing without any discomfort Head: Normocephalic without any trauma Oropharynx: Mucous membranes are moist and pink without any thrush or ulcers. Eyes: Pupils are equal and round reactive to light. Lymph nodes: No cervical, supraclavicular, inguinal or axillary lymphadenopathy.   Heart:regular rate and rhythm.  S1 and S2 without leg edema. Lung: Clear without any rhonchi or wheezes.  No dullness to percussion. Abdomin: Soft, nontender, nondistended with good bowel sounds.  No hepatosplenomegaly. Musculoskeletal: No joint deformity or effusion.  Full range of motion noted. Neurological: No deficits noted on motor, sensory and deep tendon reflex  exam. Skin: No petechial rash or dryness.  Appeared moist.  Psychiatric: Mood and affect appeared appropriate.  .   Lab Results: Lab Results  Component Value Date   WBC 4.9 01/11/2018   HGB 13.6 01/11/2018   HCT 39.5 01/11/2018   MCV 86.4 01/11/2018   PLT 114 (L) 01/11/2018     Chemistry      Component Value Date/Time   NA 140 01/11/2018 1314   NA 140 07/13/2017 1456   K 4.7 01/11/2018 1314    K 4.6 07/13/2017 1456   CL 106 01/11/2018 1314   CL 104 01/10/2013 1128   CO2 26 01/11/2018 1314   CO2 24 07/13/2017 1456   BUN 17 01/11/2018 1314   BUN 18.9 07/13/2017 1456   CREATININE 1.34 (H) 01/11/2018 1314   CREATININE 1.2 07/13/2017 1456      Component Value Date/Time   CALCIUM 10.1 01/11/2018 1314   CALCIUM 9.5 07/13/2017 1456   ALKPHOS 117 01/11/2018 1314   ALKPHOS 121 07/13/2017 1456   AST 13 01/11/2018 1314   AST 17 07/13/2017 1456   ALT 14 01/11/2018 1314   ALT 19 07/13/2017 1456   BILITOT 0.9 01/11/2018 1314   BILITOT 0.60 07/13/2017 1456     EXAM: CT ABDOMEN AND PELVIS WITH CONTRAST  TECHNIQUE: Multidetector CT imaging of the abdomen and pelvis was performed using the standard protocol following bolus administration of intravenous contrast.  CONTRAST:  158m ISOVUE-300 IOPAMIDOL (ISOVUE-300) INJECTION 61%  COMPARISON:  10/24/2012  FINDINGS: Lower chest: Lung bases are clear.  Hepatobiliary: Scattered hepatic cysts, measuring up to 1.7 cm in the posterior right hepatic lobe (series 2/image 16).  Gallbladder is unremarkable. No intrahepatic or extrahepatic ductal dilatation.  Pancreas: Mild prominence of the main pancreatic duct. No pancreatic mass or parenchymal atrophy.  Spleen: Within normal limits.  Adrenals/Urinary Tract: Adrenal glands within normal limits.  Bilateral renal cysts, measuring up to 2.3 cm in the medial left upper kidney (series 11/image 14). No hydronephrosis.  Bladder is mildly thick-walled although underdistended.  Stomach/Bowel: Stomach is within normal limits.  No evidence of bowel obstruction.  Normal appendix (series 2/image 57).  Status post partial left hemicolectomy with suture line in the left lower pelvis (series 2/image 67).  Vascular/Lymphatic: No evidence of abdominal aortic aneurysm.  Atherosclerotic calcifications of the abdominal aorta and branch vessels.  No suspicious  abdominopelvic lymphadenopathy.  Reproductive: Prostatomegaly with dystrophic calcifications.  Other: No abdominopelvic ascites.  No evidence of inguinal hernia.  Musculoskeletal: Visualized osseous structures are within normal limits.  IMPRESSION: No evidence of inguinal hernia.  Status post partial left hemicolectomy. No evidence of recurrent or metastatic disease.  No CT findings to account for the patient's pelvic pain.  Additional ancillary findings as above.  Impression and Plan:  77year old gentleman with the following issues:  1. Chronic lymphocytic leukemia (CLL) diagnosed in 2009 and has been in remission since treatment in 2013.  Laboratory data obtained today as well as CT scan obtained on Feb 03, 2018 were personally reviewed and discussed with the patient and his wife.  He has no evidence to suggest recurrent disease at this time.  I recommended continued active surveillance at this time and use salvage therapy in the future if he develop recurrent disease.  2.  Rectal cancer diagnosed in 2009.  No evidence of recurrence noted at this time based on laboratory data and imaging studies.  No further follow-up is needed at this time.  3. Thrombocytopenia: His platelet counts back to  normal at this time.  We will continue to monitor him periodically.  4.  Abdominal discomfort: Resolved since last visit.  Imaging studies in May 2019 showed no abnormalities.  5. Followup: Will be in 6 months.  15  minutes was spent with the patient face-to-face today.  More than 50% of time was dedicated to reviewing the natural course of his disease, treatment options and coordinating plan of care.    [name], MD 10/16/201911:44 AM

## 2018-07-28 DIAGNOSIS — C9110 Chronic lymphocytic leukemia of B-cell type not having achieved remission: Secondary | ICD-10-CM | POA: Diagnosis not present

## 2018-07-28 DIAGNOSIS — Z0000 Encounter for general adult medical examination without abnormal findings: Secondary | ICD-10-CM | POA: Diagnosis not present

## 2018-07-28 DIAGNOSIS — I10 Essential (primary) hypertension: Secondary | ICD-10-CM | POA: Diagnosis not present

## 2018-07-28 DIAGNOSIS — Z1389 Encounter for screening for other disorder: Secondary | ICD-10-CM | POA: Diagnosis not present

## 2018-07-28 DIAGNOSIS — Z125 Encounter for screening for malignant neoplasm of prostate: Secondary | ICD-10-CM | POA: Diagnosis not present

## 2018-07-28 DIAGNOSIS — G4700 Insomnia, unspecified: Secondary | ICD-10-CM | POA: Diagnosis not present

## 2018-07-28 DIAGNOSIS — Z23 Encounter for immunization: Secondary | ICD-10-CM | POA: Diagnosis not present

## 2018-07-28 DIAGNOSIS — N529 Male erectile dysfunction, unspecified: Secondary | ICD-10-CM | POA: Diagnosis not present

## 2018-07-28 DIAGNOSIS — E1169 Type 2 diabetes mellitus with other specified complication: Secondary | ICD-10-CM | POA: Diagnosis not present

## 2018-07-28 DIAGNOSIS — E7800 Pure hypercholesterolemia, unspecified: Secondary | ICD-10-CM | POA: Diagnosis not present

## 2018-07-28 DIAGNOSIS — D696 Thrombocytopenia, unspecified: Secondary | ICD-10-CM | POA: Diagnosis not present

## 2019-01-03 ENCOUNTER — Telehealth: Payer: Self-pay | Admitting: Oncology

## 2019-01-03 NOTE — Telephone Encounter (Signed)
Rescheduled 4/16 appt per sch msg. Called and spoke with wife, confirmed date and time of appt.

## 2019-01-12 ENCOUNTER — Other Ambulatory Visit: Payer: Medicare Other

## 2019-01-12 ENCOUNTER — Ambulatory Visit: Payer: Medicare Other | Admitting: Oncology

## 2019-02-02 DIAGNOSIS — I10 Essential (primary) hypertension: Secondary | ICD-10-CM | POA: Diagnosis not present

## 2019-02-02 DIAGNOSIS — N529 Male erectile dysfunction, unspecified: Secondary | ICD-10-CM | POA: Diagnosis not present

## 2019-02-02 DIAGNOSIS — E7800 Pure hypercholesterolemia, unspecified: Secondary | ICD-10-CM | POA: Diagnosis not present

## 2019-02-02 DIAGNOSIS — E1169 Type 2 diabetes mellitus with other specified complication: Secondary | ICD-10-CM | POA: Diagnosis not present

## 2019-02-02 DIAGNOSIS — Z1389 Encounter for screening for other disorder: Secondary | ICD-10-CM | POA: Diagnosis not present

## 2019-02-02 DIAGNOSIS — C9110 Chronic lymphocytic leukemia of B-cell type not having achieved remission: Secondary | ICD-10-CM | POA: Diagnosis not present

## 2019-02-02 DIAGNOSIS — D696 Thrombocytopenia, unspecified: Secondary | ICD-10-CM | POA: Diagnosis not present

## 2019-02-02 DIAGNOSIS — G4700 Insomnia, unspecified: Secondary | ICD-10-CM | POA: Diagnosis not present

## 2019-02-06 DIAGNOSIS — E1169 Type 2 diabetes mellitus with other specified complication: Secondary | ICD-10-CM | POA: Diagnosis not present

## 2019-02-06 DIAGNOSIS — I10 Essential (primary) hypertension: Secondary | ICD-10-CM | POA: Diagnosis not present

## 2019-02-27 DIAGNOSIS — D696 Thrombocytopenia, unspecified: Secondary | ICD-10-CM | POA: Diagnosis not present

## 2019-02-27 DIAGNOSIS — E860 Dehydration: Secondary | ICD-10-CM | POA: Diagnosis not present

## 2019-03-06 ENCOUNTER — Other Ambulatory Visit: Payer: Self-pay | Admitting: Oncology

## 2019-03-20 ENCOUNTER — Emergency Department (HOSPITAL_BASED_OUTPATIENT_CLINIC_OR_DEPARTMENT_OTHER): Payer: Medicare Other

## 2019-03-20 ENCOUNTER — Other Ambulatory Visit: Payer: Self-pay

## 2019-03-20 ENCOUNTER — Encounter (HOSPITAL_BASED_OUTPATIENT_CLINIC_OR_DEPARTMENT_OTHER): Payer: Self-pay

## 2019-03-20 ENCOUNTER — Emergency Department (HOSPITAL_BASED_OUTPATIENT_CLINIC_OR_DEPARTMENT_OTHER)
Admission: EM | Admit: 2019-03-20 | Discharge: 2019-03-20 | Disposition: A | Discharge status: Home / Self Care | Payer: Medicare Other | Attending: Emergency Medicine | Admitting: Emergency Medicine

## 2019-03-20 DIAGNOSIS — N183 Chronic kidney disease, stage 3 unspecified: Secondary | ICD-10-CM

## 2019-03-20 DIAGNOSIS — Z87891 Personal history of nicotine dependence: Secondary | ICD-10-CM | POA: Insufficient documentation

## 2019-03-20 DIAGNOSIS — R531 Weakness: Secondary | ICD-10-CM | POA: Diagnosis not present

## 2019-03-20 DIAGNOSIS — E119 Type 2 diabetes mellitus without complications: Secondary | ICD-10-CM | POA: Diagnosis not present

## 2019-03-20 DIAGNOSIS — I129 Hypertensive chronic kidney disease with stage 1 through stage 4 chronic kidney disease, or unspecified chronic kidney disease: Secondary | ICD-10-CM | POA: Insufficient documentation

## 2019-03-20 DIAGNOSIS — Z85038 Personal history of other malignant neoplasm of large intestine: Secondary | ICD-10-CM | POA: Diagnosis not present

## 2019-03-20 DIAGNOSIS — I10 Essential (primary) hypertension: Secondary | ICD-10-CM

## 2019-03-20 DIAGNOSIS — R112 Nausea with vomiting, unspecified: Secondary | ICD-10-CM | POA: Diagnosis not present

## 2019-03-20 DIAGNOSIS — Z20828 Contact with and (suspected) exposure to other viral communicable diseases: Secondary | ICD-10-CM | POA: Diagnosis not present

## 2019-03-20 DIAGNOSIS — Z79899 Other long term (current) drug therapy: Secondary | ICD-10-CM | POA: Insufficient documentation

## 2019-03-20 DIAGNOSIS — D696 Thrombocytopenia, unspecified: Secondary | ICD-10-CM | POA: Insufficient documentation

## 2019-03-20 DIAGNOSIS — C9110 Chronic lymphocytic leukemia of B-cell type not having achieved remission: Secondary | ICD-10-CM | POA: Diagnosis not present

## 2019-03-20 DIAGNOSIS — R05 Cough: Secondary | ICD-10-CM | POA: Diagnosis not present

## 2019-03-20 LAB — URINALYSIS, MICROSCOPIC (REFLEX)

## 2019-03-20 LAB — CBC
HCT: 37.5 % — ABNORMAL LOW (ref 39.0–52.0)
Hemoglobin: 12.4 g/dL — ABNORMAL LOW (ref 13.0–17.0)
MCH: 28.6 pg (ref 26.0–34.0)
MCHC: 33.1 g/dL (ref 30.0–36.0)
MCV: 86.6 fL (ref 80.0–100.0)
Platelets: 100 10*3/uL — ABNORMAL LOW (ref 150–400)
RBC: 4.33 MIL/uL (ref 4.22–5.81)
RDW: 13.4 % (ref 11.5–15.5)
WBC: 9.5 10*3/uL (ref 4.0–10.5)
nRBC: 0 % (ref 0.0–0.2)

## 2019-03-20 LAB — COMPREHENSIVE METABOLIC PANEL
ALT: 22 U/L (ref 0–44)
AST: 21 U/L (ref 15–41)
Albumin: 4.8 g/dL (ref 3.5–5.0)
Alkaline Phosphatase: 95 U/L (ref 38–126)
Anion gap: 13 (ref 5–15)
BUN: 20 mg/dL (ref 8–23)
CO2: 22 mmol/L (ref 22–32)
Calcium: 9.4 mg/dL (ref 8.9–10.3)
Chloride: 105 mmol/L (ref 98–111)
Creatinine, Ser: 1.5 mg/dL — ABNORMAL HIGH (ref 0.61–1.24)
GFR calc Af Amer: 52 mL/min — ABNORMAL LOW (ref >60)
GFR calc non Af Amer: 45 mL/min — ABNORMAL LOW (ref >60)
Glucose, Bld: 138 mg/dL — ABNORMAL HIGH (ref 70–99)
Potassium: 3.8 mmol/L (ref 3.5–5.1)
Sodium: 140 mmol/L (ref 135–145)
Total Bilirubin: 1.1 mg/dL (ref 0.3–1.2)
Total Protein: 7.4 g/dL (ref 6.5–8.1)

## 2019-03-20 LAB — URINALYSIS, ROUTINE W REFLEX MICROSCOPIC
Bilirubin Urine: NEGATIVE
Glucose, UA: NEGATIVE mg/dL
Ketones, ur: NEGATIVE mg/dL
Nitrite: NEGATIVE
Protein, ur: NEGATIVE mg/dL
Specific Gravity, Urine: <1.005 — ABNORMAL LOW (ref 1.005–1.030)
pH: 6 (ref 5.0–8.0)

## 2019-03-20 LAB — TROPONIN I: Troponin I: <0.03 ng/mL (ref <0.03)

## 2019-03-20 LAB — SARS CORONAVIRUS 2 AG (30 MIN TAT): SARS Coronavirus 2 Ag: NEGATIVE

## 2019-03-20 NOTE — ED Provider Notes (Signed)
[place] EMERGENCY DEPARTMENT Provider Note   CSN: [id number] Arrival date & time: 03/20/19  [place]     History   Chief Complaint Chief Complaint  Patient presents with  . Fatigue    HPI [name] is a 78 y.o. male.     Patient c/o general fatigue, not feeling well, poor appetite, in the past 2 weeks. Symptoms gradual onset, moderate, persistent, without acute/abrupt change today. Denies fever or chills. Had one episode n/v yesterday -  Tolerating po today. Is having normal bms. Denies dysuria or gu c/o, indicates is urinating normal amount. ?recent few lb wt decrease. Denies headache. No chest pain or discomfort. No sob. Occasional non prod cough. No specific known covid + exposure. Denies abd pain. No back/flank pain. Denies change in meds or new meds. No blood loss, rectal bleeding or melena. No fever or chills.   The history is provided by the patient.    Past Medical History:  Diagnosis Date  . CLL (chronic lymphocytic leukemia) (HCC)    chemotherapy  . Colon cancer ([place])   . Colorectal cancer ([place])    COLORECTAL DX 2/09, treated surgically  . Diabetes mellitus   . Hypercholesterolemia   . Hypertension   . Pneumonia    Bilateral pneumonia    Patient Active Problem List   Diagnosis Date Noted  . Influenza A 09/18/2013  . Syncope 09/17/2013  . Diabetes mellitus ([place]) 09/17/2013  . Hypertension   . Hypercholesterolemia   . Chronic lymphocytic leukemia ([place]) 08/01/2011  . Colon cancer ([place]) 08/01/2011    Past Surgical History:  Procedure Laterality Date  . HEMICOLECTOMY    . nasal polyps     S/P nasal surgery        Home Medications    Prior to Admission medications   Medication Sig Start Date End Date Taking? Authorizing Provider  aspirin 81 MG tablet Take 81 mg by mouth every morning.     [provider]  atorvastatin (LIPITOR) 40 MG tablet Take 40 mg by mouth every evening.  01/10/13   [provider]  GAVILYTE-N  WITH FLAVOR PACK 420 g solution See admin instructions. 06/07/17   [provider]  metFORMIN (GLUCOPHAGE) 1000 MG tablet Take 1,000 mg by mouth 2 (two) times daily with a meal. 06/05/17   [provider]  oseltamivir (TAMIFLU) 30 MG capsule Take 1 capsule (30 mg total) by mouth 2 (two) times daily. For 5 days 09/18/13   Bonnielee Haff, MD  prochlorperazine (COMPAZINE) 10 MG tablet TAKE 1 TABLET (10 MG TOTAL) BY MOUTH EVERY 6 (SIX) HOURS AS NEEDED FOR NAUSEA OR VOMITING. 03/06/19   Wyatt Portela, MD  zolpidem (AMBIEN) 10 MG tablet TAKE 1/2-1 TABLET BY MOUTH AT BEDTIME, AS NEEDED FOR INSOMNIA 07/10/17   [provider]    Family History Family History  Problem Relation Age of Onset  . Ovarian cancer Mother   . Breast cancer Maternal Aunt     Social History Social History   Tobacco Use  . Smoking status: Former Smoker    Packs/day: 0.80    Years: 35.00    Pack years: 28.00    Types: Cigarettes  . Smokeless tobacco: Never Used  Substance Use Topics  . Alcohol use: No  . Drug use: No     Allergies   Patient has no known allergies.   Review of Systems Review of Systems  Constitutional: Negative for chills and fever.  HENT: Negative for sore throat.  Eyes: Negative for visual disturbance.  Respiratory: Positive for cough. Negative for shortness of breath.   Cardiovascular: Negative for chest pain.  Gastrointestinal: Positive for nausea. Negative for abdominal pain, blood in stool and diarrhea.  Endocrine: Negative for polyuria.  Genitourinary: Negative for dysuria and flank pain.  Musculoskeletal: Negative for back pain and neck pain.  Skin: Negative for rash.  Neurological: Negative for speech difficulty, numbness and headaches.  Hematological: Does not bruise/bleed easily.  Psychiatric/Behavioral: Negative for confusion.     Physical Exam Updated Vital Signs BP (!) 201/87 (BP Location: Right Arm)   Pulse 90   Temp 98.2 F (36.8 C) (Oral)    Resp 20   Ht 1.702 m (5\' 7" )   Wt 59.9 kg   SpO2 99%   BMI 20.67 kg/m   Physical Exam Vitals signs and nursing note reviewed.  Constitutional:      Appearance: Normal appearance. He is well-developed.  HENT:     Head: Atraumatic.     Nose: Nose normal.     Mouth/Throat:     Mouth: Mucous membranes are moist.     Pharynx: Oropharynx is clear.  Eyes:     General: No scleral icterus.    Conjunctiva/sclera: Conjunctivae normal.     Pupils: Pupils are equal, round, and reactive to light.  Neck:     Musculoskeletal: Normal range of motion and neck supple. No neck rigidity or muscular tenderness.     Trachea: No tracheal deviation.     Comments: Thyroid not enlarged, not tender.  Cardiovascular:     Rate and Rhythm: Normal rate and regular rhythm.     Pulses: Normal pulses.     Heart sounds: Normal heart sounds. No murmur. No friction rub. No gallop.   Pulmonary:     Effort: Pulmonary effort is normal. No accessory muscle usage or respiratory distress.     Breath sounds: Normal breath sounds.  Abdominal:     General: Bowel sounds are normal. There is no distension.     Palpations: Abdomen is soft.     Tenderness: There is no abdominal tenderness. There is no guarding.  Genitourinary:    Comments: No cva tenderness. Musculoskeletal:        General: No swelling or tenderness.     Right lower leg: No edema.     Left lower leg: No edema.  Lymphadenopathy:     Cervical: No cervical adenopathy.  Skin:    General: Skin is warm and dry.     Findings: No rash.  Neurological:     Mental Status: He is alert.     Comments: Alert, speech clear. Motor/sens grossly intact bil. Steady gait.   Psychiatric:        Mood and Affect: Mood normal.      ED Treatments / Results  Labs (all labs ordered are listed, but only abnormal results are displayed) Results for orders placed or performed during the hospital encounter of 03/20/19  SARS Coronavirus 2 (Hosp order,Performed in [facility] lab via [employer] [facility])   Specimen: Dry Nasal Swab ([employer] Now)  Result Value Ref Range   SARS Coronavirus 2 ([employer] Now) NEGATIVE NEGATIVE  Urinalysis, Routine w reflex microscopic  Result Value Ref Range   Color, Urine YELLOW YELLOW   APPearance CLEAR CLEAR   Specific Gravity, Urine <1.005 (L) 1.005 - 1.030   pH 6.0 5.0 - 8.0   Glucose, UA NEGATIVE NEGATIVE mg/dL   Hgb urine dipstick TRACE (A) NEGATIVE  Bilirubin Urine NEGATIVE NEGATIVE   Ketones, ur NEGATIVE NEGATIVE mg/dL   Protein, ur NEGATIVE NEGATIVE mg/dL   Nitrite NEGATIVE NEGATIVE   Leukocytes,Ua TRACE (A) NEGATIVE  CBC  Result Value Ref Range   WBC 9.5 4.0 - 10.5 K/uL   RBC 4.33 4.22 - 5.81 MIL/uL   Hemoglobin 12.4 (L) 13.0 - 17.0 g/dL   HCT 37.5 (L) 39.0 - 52.0 %   MCV 86.6 80.0 - 100.0 fL   MCH 28.6 26.0 - 34.0 pg   MCHC 33.1 30.0 - 36.0 g/dL   RDW 13.4 11.5 - 15.5 %   Platelets 100 (L) 150 - 400 K/uL   nRBC 0.0 0.0 - 0.2 %  Comprehensive metabolic panel  Result Value Ref Range   Sodium 140 135 - 145 mmol/L   Potassium 3.8 3.5 - 5.1 mmol/L   Chloride 105 98 - 111 mmol/L   CO2 22 22 - 32 mmol/L   Glucose, Bld 138 (H) 70 - 99 mg/dL   BUN 20 8 - 23 mg/dL   Creatinine, Ser 1.50 (H) 0.61 - 1.24 mg/dL   Calcium 9.4 8.9 - 10.3 mg/dL   Total Protein 7.4 6.5 - 8.1 g/dL   Albumin 4.8 3.5 - 5.0 g/dL   AST 21 15 - 41 U/L   ALT 22 0 - 44 U/L   Alkaline Phosphatase 95 38 - 126 U/L   Total Bilirubin 1.1 0.3 - 1.2 mg/dL   GFR calc non Af Amer 45 (L) >60 mL/min   GFR calc Af Amer 52 (L) >60 mL/min   Anion gap 13 5 - 15  Troponin I - ONCE - STAT  Result Value Ref Range   Troponin I <0.03 <0.03 ng/mL  Urinalysis, Microscopic (reflex)  Result Value Ref Range   RBC / HPF 0-5 0 - 5 RBC/hpf   WBC, UA 0-5 0 - 5 WBC/hpf   Bacteria, UA RARE (A) NONE SEEN   Squamous Epithelial / LPF 0-5 0 - 5   Mucus PRESENT    Dg Chest Port 1 View  Result Date: 03/20/2019 CLINICAL DATA:  Fatigue, cough, weakness EXAM: PORTABLE  CHEST 1 VIEW COMPARISON:  09/17/2013 FINDINGS: The heart size and mediastinal contours are within normal limits. Both lungs are clear. The visualized skeletal structures are unremarkable. Trachea is midline. Aorta atherosclerotic. No acute osseous finding. IMPRESSION: No active disease. Electronically Signed   By: Jerilynn Mages.  Shick M.D.   On: 03/20/2019 20:30    EKG EKG Interpretation  Date/Time:  Monday March 20 2019 19:49:19 EDT Ventricular Rate:  85 PR Interval:    QRS Duration: 71 QT Interval:  347 QTC Calculation: 413 R Axis:   -12 Text Interpretation:  Sinus rhythm No significant change since last tracing Confirmed by Lajean Saver [phone]) on 03/20/2019 7:57:27 PM   Radiology Dg Chest Port 1 View  Result Date: 03/20/2019 CLINICAL DATA:  Fatigue, cough, weakness EXAM: PORTABLE CHEST 1 VIEW COMPARISON:  09/17/2013 FINDINGS: The heart size and mediastinal contours are within normal limits. Both lungs are clear. The visualized skeletal structures are unremarkable. Trachea is midline. Aorta atherosclerotic. No acute osseous finding. IMPRESSION: No active disease. Electronically Signed   By: Jerilynn Mages.  Shick M.D.   On: 03/20/2019 20:30    Procedures Procedures (including critical care time)  Medications Ordered in ED Medications - No data to display   Initial Impression / Assessment and Plan / ED Course  I have reviewed the triage vital signs and the nursing notes.  Pertinent labs &  imaging results that were available during my care of the patient were reviewed by me and considered in my medical decision making (see chart for details).  Iv ns. Labs.   Reviewed nursing notes and prior charts for additional history.   CXR reviewed by me - no pna.  Labs reviewed by me - mild cri, sl inc from prior. covid neg. Trop neg.   Recheck, pt. Alert, content. No distress.   Po fluids/food. No nv. No increased wob. Pt afebrile.  Pt currently appears stable for d/c. r  rec close pcp f/u.  Return  precautions provided.     Final Clinical Impressions(s) / ED Diagnoses   Final diagnoses:  None    ED Discharge Orders    None       Lajean Saver, MD 03/20/19 2112

## 2019-03-20 NOTE — Discharge Instructions (Addendum)
It was our pleasure to provide your ER care today - we hope that you feel better.  Rest. Drink plenty of fluids. Eat balanced diet. Consider supplementing your nutrition with Ensure, Boost or other shake.   From today's labs, your platelet count is low (100), and your kidney function test mildly elevated (creatinine 1.5) - follow up closely with your primary care doctor in the next 1-2 weeks - call office to arrange for follow up. Also follow up with primary care doctor in the coming week for recheck of blood pressure as it is high today.   Your covid test is negative.  Return to ER if worse, new symptoms, fevers, increased trouble breathing, new or severe pain, other concern.

## 2019-03-20 NOTE — ED Triage Notes (Signed)
Pt c/o fatigue, loss of taste and smell, weight los x 2 weeks-vomitted x 1 today -denies diarrhea-states "just got a bad feeling"-denies pain

## 2019-03-25 ENCOUNTER — Other Ambulatory Visit: Payer: Self-pay

## 2019-03-25 ENCOUNTER — Encounter (HOSPITAL_COMMUNITY): Payer: Self-pay | Admitting: Emergency Medicine

## 2019-03-25 ENCOUNTER — Inpatient Hospital Stay (HOSPITAL_COMMUNITY): Payer: Medicare Other

## 2019-03-25 ENCOUNTER — Emergency Department (HOSPITAL_COMMUNITY): Payer: Medicare Other

## 2019-03-25 ENCOUNTER — Inpatient Hospital Stay (HOSPITAL_COMMUNITY)
Admission: EM | Admit: 2019-03-25 | Discharge: 2019-03-27 | DRG: 312 | Disposition: A | Discharge status: Home / Self Care | Payer: Medicare Other | Attending: Internal Medicine | Admitting: Internal Medicine

## 2019-03-25 DIAGNOSIS — Z87891 Personal history of nicotine dependence: Secondary | ICD-10-CM

## 2019-03-25 DIAGNOSIS — C189 Malignant neoplasm of colon, unspecified: Secondary | ICD-10-CM | POA: Diagnosis present

## 2019-03-25 DIAGNOSIS — R6510 Systemic inflammatory response syndrome (SIRS) of non-infectious origin without acute organ dysfunction: Secondary | ICD-10-CM | POA: Diagnosis present

## 2019-03-25 DIAGNOSIS — E119 Type 2 diabetes mellitus without complications: Secondary | ICD-10-CM | POA: Diagnosis present

## 2019-03-25 DIAGNOSIS — R4182 Altered mental status, unspecified: Secondary | ICD-10-CM

## 2019-03-25 DIAGNOSIS — E785 Hyperlipidemia, unspecified: Secondary | ICD-10-CM | POA: Diagnosis present

## 2019-03-25 DIAGNOSIS — R55 Syncope and collapse: Secondary | ICD-10-CM | POA: Diagnosis not present

## 2019-03-25 DIAGNOSIS — Z85048 Personal history of other malignant neoplasm of rectum, rectosigmoid junction, and anus: Secondary | ICD-10-CM | POA: Diagnosis not present

## 2019-03-25 DIAGNOSIS — Z7982 Long term (current) use of aspirin: Secondary | ICD-10-CM

## 2019-03-25 DIAGNOSIS — R1084 Generalized abdominal pain: Secondary | ICD-10-CM | POA: Diagnosis not present

## 2019-03-25 DIAGNOSIS — Y92009 Unspecified place in unspecified non-institutional (private) residence as the place of occurrence of the external cause: Secondary | ICD-10-CM | POA: Diagnosis not present

## 2019-03-25 DIAGNOSIS — I2510 Atherosclerotic heart disease of native coronary artery without angina pectoris: Secondary | ICD-10-CM | POA: Diagnosis present

## 2019-03-25 DIAGNOSIS — Z03818 Encounter for observation for suspected exposure to other biological agents ruled out: Secondary | ICD-10-CM | POA: Diagnosis not present

## 2019-03-25 DIAGNOSIS — I160 Hypertensive urgency: Secondary | ICD-10-CM | POA: Diagnosis present

## 2019-03-25 DIAGNOSIS — G4700 Insomnia, unspecified: Secondary | ICD-10-CM | POA: Diagnosis present

## 2019-03-25 DIAGNOSIS — G9340 Encephalopathy, unspecified: Secondary | ICD-10-CM | POA: Diagnosis not present

## 2019-03-25 DIAGNOSIS — R590 Localized enlarged lymph nodes: Secondary | ICD-10-CM | POA: Diagnosis not present

## 2019-03-25 DIAGNOSIS — I10 Essential (primary) hypertension: Secondary | ICD-10-CM | POA: Diagnosis present

## 2019-03-25 DIAGNOSIS — R0689 Other abnormalities of breathing: Secondary | ICD-10-CM | POA: Diagnosis not present

## 2019-03-25 DIAGNOSIS — Z20828 Contact with and (suspected) exposure to other viral communicable diseases: Secondary | ICD-10-CM | POA: Diagnosis present

## 2019-03-25 DIAGNOSIS — T50905A Adverse effect of unspecified drugs, medicaments and biological substances, initial encounter: Secondary | ICD-10-CM

## 2019-03-25 DIAGNOSIS — D696 Thrombocytopenia, unspecified: Secondary | ICD-10-CM | POA: Diagnosis not present

## 2019-03-25 DIAGNOSIS — E860 Dehydration: Secondary | ICD-10-CM | POA: Diagnosis not present

## 2019-03-25 DIAGNOSIS — R0682 Tachypnea, not elsewhere classified: Secondary | ICD-10-CM | POA: Diagnosis present

## 2019-03-25 DIAGNOSIS — T426X5A Adverse effect of other antiepileptic and sedative-hypnotic drugs, initial encounter: Secondary | ICD-10-CM | POA: Diagnosis present

## 2019-03-25 DIAGNOSIS — F5101 Primary insomnia: Secondary | ICD-10-CM | POA: Diagnosis not present

## 2019-03-25 DIAGNOSIS — Z66 Do not resuscitate: Secondary | ICD-10-CM | POA: Diagnosis present

## 2019-03-25 DIAGNOSIS — I361 Nonrheumatic tricuspid (valve) insufficiency: Secondary | ICD-10-CM | POA: Diagnosis not present

## 2019-03-25 DIAGNOSIS — I679 Cerebrovascular disease, unspecified: Secondary | ICD-10-CM | POA: Diagnosis not present

## 2019-03-25 DIAGNOSIS — T887XXA Unspecified adverse effect of drug or medicament, initial encounter: Secondary | ICD-10-CM | POA: Diagnosis not present

## 2019-03-25 DIAGNOSIS — Z7984 Long term (current) use of oral hypoglycemic drugs: Secondary | ICD-10-CM | POA: Diagnosis not present

## 2019-03-25 DIAGNOSIS — R52 Pain, unspecified: Secondary | ICD-10-CM | POA: Diagnosis not present

## 2019-03-25 DIAGNOSIS — R000 Tachycardia, unspecified: Secondary | ICD-10-CM | POA: Diagnosis present

## 2019-03-25 DIAGNOSIS — R404 Transient alteration of awareness: Secondary | ICD-10-CM | POA: Diagnosis not present

## 2019-03-25 DIAGNOSIS — C9110 Chronic lymphocytic leukemia of B-cell type not having achieved remission: Secondary | ICD-10-CM | POA: Diagnosis not present

## 2019-03-25 LAB — CBC WITH DIFFERENTIAL/PLATELET
Abs Immature Granulocytes: 0.03 10*3/uL (ref 0.00–0.07)
Basophils Absolute: 0 10*3/uL (ref 0.0–0.1)
Basophils Relative: 0 %
Eosinophils Absolute: 0.2 10*3/uL (ref 0.0–0.5)
Eosinophils Relative: 2 %
HCT: 36.6 % — ABNORMAL LOW (ref 39.0–52.0)
Hemoglobin: 12.2 g/dL — ABNORMAL LOW (ref 13.0–17.0)
Immature Granulocytes: 0 %
Lymphocytes Relative: 53 %
Lymphs Abs: 4.6 10*3/uL — ABNORMAL HIGH (ref 0.7–4.0)
MCH: 29.1 pg (ref 26.0–34.0)
MCHC: 33.3 g/dL (ref 30.0–36.0)
MCV: 87.4 fL (ref 80.0–100.0)
Monocytes Absolute: 0.5 10*3/uL (ref 0.1–1.0)
Monocytes Relative: 6 %
Neutro Abs: 3.5 10*3/uL (ref 1.7–7.7)
Neutrophils Relative %: 39 %
Platelets: 91 10*3/uL — ABNORMAL LOW (ref 150–400)
RBC: 4.19 MIL/uL — ABNORMAL LOW (ref 4.22–5.81)
RDW: 13.6 % (ref 11.5–15.5)
WBC: 8.8 10*3/uL (ref 4.0–10.5)
nRBC: 0 % (ref 0.0–0.2)

## 2019-03-25 LAB — RAPID URINE DRUG SCREEN, HOSP PERFORMED
Amphetamines: NOT DETECTED
Barbiturates: NOT DETECTED
Benzodiazepines: NOT DETECTED
Cocaine: NOT DETECTED
Opiates: NOT DETECTED
Tetrahydrocannabinol: NOT DETECTED

## 2019-03-25 LAB — CBG MONITORING, ED
Glucose-Capillary: 147 mg/dL — ABNORMAL HIGH (ref 70–99)
Glucose-Capillary: 241 mg/dL — ABNORMAL HIGH (ref 70–99)

## 2019-03-25 LAB — TSH: TSH: 1.827 u[IU]/mL (ref 0.350–4.500)

## 2019-03-25 LAB — COMPREHENSIVE METABOLIC PANEL
ALT: 17 U/L (ref 0–44)
AST: 16 U/L (ref 15–41)
Albumin: 4.2 g/dL (ref 3.5–5.0)
Alkaline Phosphatase: 88 U/L (ref 38–126)
Anion gap: 14 (ref 5–15)
BUN: 21 mg/dL (ref 8–23)
CO2: 21 mmol/L — ABNORMAL LOW (ref 22–32)
Calcium: 9.2 mg/dL (ref 8.9–10.3)
Chloride: 108 mmol/L (ref 98–111)
Creatinine, Ser: 1.37 mg/dL — ABNORMAL HIGH (ref 0.61–1.24)
GFR calc Af Amer: 58 mL/min — ABNORMAL LOW (ref >60)
GFR calc non Af Amer: 50 mL/min — ABNORMAL LOW (ref >60)
Glucose, Bld: 148 mg/dL — ABNORMAL HIGH (ref 70–99)
Potassium: 3.7 mmol/L (ref 3.5–5.1)
Sodium: 143 mmol/L (ref 135–145)
Total Bilirubin: 0.9 mg/dL (ref 0.3–1.2)
Total Protein: 6.6 g/dL (ref 6.5–8.1)

## 2019-03-25 LAB — MAGNESIUM: Magnesium: 1.5 mg/dL — ABNORMAL LOW (ref 1.7–2.4)

## 2019-03-25 LAB — URINALYSIS, ROUTINE W REFLEX MICROSCOPIC
Bilirubin Urine: NEGATIVE
Glucose, UA: NEGATIVE mg/dL
Hgb urine dipstick: NEGATIVE
Ketones, ur: NEGATIVE mg/dL
Leukocytes,Ua: NEGATIVE
Nitrite: NEGATIVE
Protein, ur: NEGATIVE mg/dL
Specific Gravity, Urine: 1.004 — ABNORMAL LOW (ref 1.005–1.030)
pH: 6 (ref 5.0–8.0)

## 2019-03-25 LAB — GLUCOSE, CAPILLARY
Glucose-Capillary: 134 mg/dL — ABNORMAL HIGH (ref 70–99)
Glucose-Capillary: 172 mg/dL — ABNORMAL HIGH (ref 70–99)

## 2019-03-25 LAB — LACTIC ACID, PLASMA
Lactic Acid, Venous: 2.1 mmol/L (ref 0.5–1.9)
Lactic Acid, Venous: 1.4 mmol/L (ref 0.5–1.9)

## 2019-03-25 LAB — SALICYLATE LEVEL: Salicylate Lvl: <7 mg/dL (ref 2.8–30.0)

## 2019-03-25 LAB — MRSA PCR SCREENING: MRSA by PCR: NEGATIVE

## 2019-03-25 LAB — ETHANOL: Alcohol, Ethyl (B): <10 mg/dL (ref <10)

## 2019-03-25 LAB — PROTIME-INR
INR: 1 (ref 0.8–1.2)
Prothrombin Time: 12.9 seconds (ref 11.4–15.2)

## 2019-03-25 LAB — AMMONIA: Ammonia: 16 umol/L (ref 9–35)

## 2019-03-25 LAB — ACETAMINOPHEN LEVEL: Acetaminophen (Tylenol), Serum: <10 ug/mL — ABNORMAL LOW (ref 10–30)

## 2019-03-25 LAB — SARS CORONAVIRUS 2 BY RT PCR (HOSPITAL ORDER, PERFORMED IN ~~LOC~~ HOSPITAL LAB): SARS Coronavirus 2: NEGATIVE

## 2019-03-25 MED ORDER — ONDANSETRON HCL 4 MG PO TABS: 4.0000 mg | ORAL_TABLET | Freq: Four times a day (QID) | ORAL | Status: DC | PRN

## 2019-03-25 MED ORDER — METRONIDAZOLE IN NACL 5-0.79 MG/ML-% IV SOLN
500.0000 mg | Freq: Three times a day (TID) | INTRAVENOUS | Status: DC
  Administered 2019-03-25 – 2019-03-26 (×3): 500 mg via INTRAVENOUS
  Filled 2019-03-25 (×3): qty 100

## 2019-03-25 MED ORDER — SODIUM CHLORIDE 0.9% FLUSH
3.0000 mL | Freq: Once | INTRAVENOUS | Status: AC
  Administered 2019-03-25: 3 mL via INTRAVENOUS

## 2019-03-25 MED ORDER — INSULIN ASPART 100 UNIT/ML ~~LOC~~ SOLN
0.0000 [IU] | Freq: Three times a day (TID) | SUBCUTANEOUS | Status: DC
  Administered 2019-03-25: 12:00:00 3 [IU] via SUBCUTANEOUS
  Administered 2019-03-25 – 2019-03-26 (×2): 1 [IU] via SUBCUTANEOUS
  Administered 2019-03-26: 5 [IU] via SUBCUTANEOUS
  Administered 2019-03-27: 2 [IU] via SUBCUTANEOUS
  Filled 2019-03-25: qty 0.09

## 2019-03-25 MED ORDER — POTASSIUM CHLORIDE IN NACL 20-0.9 MEQ/L-% IV SOLN
INTRAVENOUS | Status: DC
  Administered 2019-03-25 – 2019-03-26 (×2): via INTRAVENOUS
  Filled 2019-03-25 (×3): qty 1000

## 2019-03-25 MED ORDER — VANCOMYCIN HCL IN DEXTROSE 750-5 MG/150ML-% IV SOLN
750.0000 mg | INTRAVENOUS | Status: DC
  Administered 2019-03-26: 750 mg via INTRAVENOUS
  Filled 2019-03-25: qty 150

## 2019-03-25 MED ORDER — SODIUM CHLORIDE 0.9 % IV SOLN
1.0000 g | Freq: Two times a day (BID) | INTRAVENOUS | Status: DC
  Administered 2019-03-25 – 2019-03-26 (×2): 1 g via INTRAVENOUS
  Filled 2019-03-25 (×2): qty 1

## 2019-03-25 MED ORDER — VANCOMYCIN HCL IN DEXTROSE 1-5 GM/200ML-% IV SOLN: 1000.0000 mg | Freq: Once | INTRAVENOUS | Status: DC

## 2019-03-25 MED ORDER — NALOXONE HCL 2 MG/2ML IJ SOSY
2.0000 mg | PREFILLED_SYRINGE | Freq: Once | INTRAMUSCULAR | Status: AC
  Administered 2019-03-25: 2 mg via INTRAVENOUS

## 2019-03-25 MED ORDER — ENOXAPARIN SODIUM 40 MG/0.4ML ~~LOC~~ SOLN
40.0000 mg | SUBCUTANEOUS | Status: DC
  Administered 2019-03-25 – 2019-03-26 (×2): 40 mg via SUBCUTANEOUS
  Filled 2019-03-25 (×2): qty 0.4

## 2019-03-25 MED ORDER — SODIUM CHLORIDE 0.9% FLUSH
3.0000 mL | Freq: Two times a day (BID) | INTRAVENOUS | Status: DC
  Administered 2019-03-25 – 2019-03-26 (×2): 3 mL via INTRAVENOUS

## 2019-03-25 MED ORDER — SODIUM CHLORIDE (PF) 0.9 % IJ SOLN
INTRAMUSCULAR | Status: AC
  Filled 2019-03-25: qty 50

## 2019-03-25 MED ORDER — ZOLPIDEM TARTRATE 5 MG PO TABS
5.0000 mg | ORAL_TABLET | Freq: Every evening | ORAL | Status: DC | PRN
  Administered 2019-03-25: 5 mg via ORAL
  Filled 2019-03-25: qty 1

## 2019-03-25 MED ORDER — INSULIN ASPART 100 UNIT/ML ~~LOC~~ SOLN
0.0000 [IU] | Freq: Every day | SUBCUTANEOUS | Status: DC
  Filled 2019-03-25: qty 0.05

## 2019-03-25 MED ORDER — ATORVASTATIN CALCIUM 40 MG PO TABS
40.0000 mg | ORAL_TABLET | Freq: Every evening | ORAL | Status: DC
  Administered 2019-03-25 – 2019-03-26 (×2): 40 mg via ORAL
  Filled 2019-03-25 (×2): qty 1

## 2019-03-25 MED ORDER — VANCOMYCIN HCL 10 G IV SOLR
1250.0000 mg | Freq: Once | INTRAVENOUS | Status: AC
  Administered 2019-03-25: 1250 mg via INTRAVENOUS
  Filled 2019-03-25: qty 1250

## 2019-03-25 MED ORDER — IOHEXOL 300 MG/ML  SOLN
100.0000 mL | Freq: Once | INTRAMUSCULAR | Status: AC | PRN
  Administered 2019-03-25: 100 mL via INTRAVENOUS

## 2019-03-25 MED ORDER — ASPIRIN 81 MG PO CHEW
81.0000 mg | CHEWABLE_TABLET | Freq: Every morning | ORAL | Status: DC
  Administered 2019-03-25 – 2019-03-27 (×3): 81 mg via ORAL
  Filled 2019-03-25 (×3): qty 1

## 2019-03-25 MED ORDER — SODIUM CHLORIDE 0.9 % IV SOLN
2.0000 g | Freq: Once | INTRAVENOUS | Status: DC
  Filled 2019-03-25: qty 2

## 2019-03-25 MED ORDER — IOHEXOL 300 MG/ML  SOLN
30.0000 mL | Freq: Once | INTRAMUSCULAR | Status: AC | PRN
  Administered 2019-03-25: 30 mL via ORAL

## 2019-03-25 MED ORDER — ONDANSETRON HCL 4 MG/2ML IJ SOLN: 4.0000 mg | Freq: Four times a day (QID) | INTRAMUSCULAR | Status: DC | PRN

## 2019-03-25 MED ORDER — NALOXONE HCL 2 MG/2ML IJ SOSY
PREFILLED_SYRINGE | INTRAMUSCULAR | Status: AC
  Filled 2019-03-25: qty 2

## 2019-03-25 MED ORDER — SODIUM CHLORIDE 0.9 % IV BOLUS (SEPSIS)
1000.0000 mL | Freq: Once | INTRAVENOUS | Status: AC
  Administered 2019-03-25: 1000 mL via INTRAVENOUS

## 2019-03-25 MED ORDER — ACETAMINOPHEN 325 MG PO TABS
650.0000 mg | ORAL_TABLET | Freq: Four times a day (QID) | ORAL | Status: DC | PRN
  Administered 2019-03-26: 650 mg via ORAL
  Filled 2019-03-25: qty 2

## 2019-03-25 MED ORDER — SODIUM CHLORIDE 0.9 % IV SOLN
2.0000 g | Freq: Once | INTRAVENOUS | Status: AC
  Administered 2019-03-25: 2 g via INTRAVENOUS
  Filled 2019-03-25: qty 2

## 2019-03-25 MED ORDER — ACETAMINOPHEN 650 MG RE SUPP: 650.0000 mg | Freq: Four times a day (QID) | RECTAL | Status: DC | PRN

## 2019-03-25 MED ORDER — ALBUTEROL SULFATE (2.5 MG/3ML) 0.083% IN NEBU: 2.5000 mg | INHALATION_SOLUTION | Freq: Four times a day (QID) | RESPIRATORY_TRACT | Status: DC | PRN

## 2019-03-25 MED ORDER — METRONIDAZOLE IN NACL 5-0.79 MG/ML-% IV SOLN
500.0000 mg | Freq: Once | INTRAVENOUS | Status: AC
  Administered 2019-03-25: 500 mg via INTRAVENOUS
  Filled 2019-03-25: qty 100

## 2019-03-25 MED ORDER — HYDRALAZINE HCL 20 MG/ML IJ SOLN
10.0000 mg | Freq: Four times a day (QID) | INTRAMUSCULAR | Status: DC | PRN
  Administered 2019-03-25 (×2): 10 mg via INTRAVENOUS
  Filled 2019-03-25 (×2): qty 1

## 2019-03-25 MED ORDER — PROCHLORPERAZINE MALEATE 10 MG PO TABS
10.0000 mg | ORAL_TABLET | Freq: Four times a day (QID) | ORAL | Status: DC | PRN
  Filled 2019-03-25: qty 1

## 2019-03-25 NOTE — H&P (Addendum)
History and Physical    [name] FHQ:[id number] DOB: 1941-02-25 DOA: 03/25/2019  PCP: [name], MD  Patient coming from: Home  I have personally briefly reviewed patient's old medical records in [facility]  Chief Complaint: Brought into ED due to unresponsiveness however patient is alert currently and has no complaint.  HPI: [name] is a 78 y.o. male with medical history significant of CLL diagnosed in August 2018 and rectal cancer diagnosed in April 2009, chronic thrombocytopenia prediabetes, hypertension and CAD was brought into the ED by EMS due to unresponsiveness.  According to ED physician report, this was the reason for him to be brought into the ED and upon arrival to the ED, he was lethargic and was unable to provide any history.  When I saw patient in the ED, he was alert and oriented and was able to talk to me completely coherently.  He denied any symptoms.  He does not know why he was brought into the emergency department but he was oriented x3 and had no complaints.  I called his wife [name] at [phone] who provided me with more history.  According to her, patient usually goes to bed at around 12 AM and wakes up at 9:30 AM.  However she goes to bed few hours before him.  Last night she woke up at around 1 AM and did not find him in the bed so she tried to look for him and he did not answer.  She finally found him in another room in the home and he was laying in the bed with lights off and fan on.  She tried to wake him up but he would not wake up so she called EMS.  According to her, he normally goes to sleep another bedroom when he does not feel well and he does take 5 mg of Ambien at night.  He is not on any other sedatives.  She further told me that patient has not been feeling well and had lethargy, decreased appetite since last 5 days and he also went to the ED on 03/20/2019 and was discharged home in a stable condition.  According to her, he also has  intermittent nausea and vomiting so that is not an acute issue.  She tells me that there is no previous history of unconsciousness and the patient.  ED Course: Upon arrival to the emergency department he was hypertensive, tachycardic and tachypneic but afebrile.  CBC unremarkable.  BMP with his baseline creatinine with no other abnormality.  Lactic acid elevated at 3.4.  Urine analysis and chest x-ray normal.  COVID-19 negative.  Ammonia normal.  INR and a Tylenol level normal as well.  UDS pending.  Salicylate normal.  Patient met criteria for Sirs with no source of infection.  He was given cefepime and vancomycin in the ED and hospital service was consulted for admission.  Review of Systems: As per HPI otherwise 10 point review of systems negative.    Past Medical History:  Diagnosis Date  . CLL (chronic lymphocytic leukemia) (HCC)    chemotherapy  . Colon cancer ([place])   . Colorectal cancer ([place])    COLORECTAL DX 2/09, treated surgically  . Diabetes mellitus   . Hypercholesterolemia   . Hypertension   . Pneumonia    Bilateral pneumonia    Past Surgical History:  Procedure Laterality Date  . HEMICOLECTOMY    . nasal polyps     S/P nasal surgery     reports that  he has quit smoking. His smoking use included cigarettes. He has a 28.00 pack-year smoking history. He has never used smokeless tobacco. He reports that he does not drink alcohol or use drugs.  Allergies  Allergen Reactions  . Hydrocodone-Acetaminophen Other (See Comments)    Loss of weight    Family History  Problem Relation Age of Onset  . Ovarian cancer Mother   . Breast cancer Maternal Aunt     Prior to Admission medications   Medication Sig Start Date End Date Taking? Authorizing Provider  aspirin 81 MG tablet Take 81 mg by mouth every morning.    Yes [provider]  atorvastatin (LIPITOR) 40 MG tablet Take 40 mg by mouth every evening.  01/10/13  Yes [provider]  metFORMIN (GLUCOPHAGE)  1000 MG tablet Take 1,000 mg by mouth 2 (two) times daily with a meal. 06/05/17  Yes [provider]  Multiple Vitamin (MULTIVITAMIN WITH MINERALS) TABS tablet Take 1 tablet by mouth daily.   Yes [provider]  prochlorperazine (COMPAZINE) 10 MG tablet TAKE 1 TABLET (10 MG TOTAL) BY MOUTH EVERY 6 (SIX) HOURS AS NEEDED FOR NAUSEA OR VOMITING. 03/06/19  Yes Wyatt Portela, MD  oseltamivir (TAMIFLU) 30 MG capsule Take 1 capsule (30 mg total) by mouth 2 (two) times daily. For 5 days Patient not taking: Reported on 03/25/2019 09/18/13   Bonnielee Haff, MD  zolpidem (AMBIEN) 10 MG tablet Take 5-10 mg by mouth at bedtime as needed for sleep.  07/10/17   [provider]    Physical Exam: Vitals:   03/25/19 0700 03/25/19 0800 03/25/19 0830 03/25/19 0900  BP: (!) 173/92 (!) 174/94 (!) 168/97 (!) 172/91  Pulse: (!) 126 (!) 128 (!) 117 (!) 117  Resp: 20 11 (!) 22 (!) 25  Temp:      TempSrc:      SpO2: 99% 98% 99% 100%  Weight:      Height:        Constitutional: NAD, calm, comfortable Vitals:   03/25/19 0700 03/25/19 0800 03/25/19 0830 03/25/19 0900  BP: (!) 173/92 (!) 174/94 (!) 168/97 (!) 172/91  Pulse: (!) 126 (!) 128 (!) 117 (!) 117  Resp: 20 11 (!) 22 (!) 25  Temp:      TempSrc:      SpO2: 99% 98% 99% 100%  Weight:      Height:       Eyes: PERRL, lids and conjunctivae normal ENMT: Mucous membranes are moist. Posterior pharynx clear of any exudate or lesions.Normal dentition.  Neck: normal, supple, no masses, no thyromegaly Respiratory: clear to auscultation bilaterally, no wheezing, no crackles. Normal respiratory effort. No accessory muscle use.  Cardiovascular: Regular rate and rhythm, no murmurs / rubs / gallops. No extremity edema. 2+ pedal pulses. No carotid bruits.  Abdomen: no tenderness, no masses palpated. No hepatosplenomegaly. Bowel sounds positive.  Musculoskeletal: no clubbing / cyanosis. No joint deformity upper and lower extremities. Good ROM,  no contractures. Normal muscle tone.  Skin: no rashes, lesions, ulcers. No induration Neurologic: CN 2-12 grossly intact. Sensation intact, DTR normal. Strength 5/5 in all 4.  Psychiatric: Normal judgment and insight. Alert and oriented x 3. Normal mood.    Labs on Admission: I have personally reviewed following labs and imaging studies  CBC: Recent Labs  Lab 03/20/19 1930 03/25/19 0227  WBC 9.5 8.8  NEUTROABS  --  3.5  HGB 12.4* 12.2*  HCT 37.5* 36.6*  MCV 86.6 87.4  PLT 100* 91*  Basic Metabolic Panel: Recent Labs  Lab 03/20/19 1930 03/25/19 0227  NA 140 143  K 3.8 3.7  CL 105 108  CO2 22 21*  GLUCOSE 138* 148*  BUN 20 21  CREATININE 1.50* 1.37*  CALCIUM 9.4 9.2   GFR: Estimated Creatinine Clearance: 38.8 mL/min (A) (by C-G formula based on SCr of 1.37 mg/dL (H)). Liver Function Tests: Recent Labs  Lab 03/20/19 1930 03/25/19 0227  AST 21 16  ALT 22 17  ALKPHOS 95 88  BILITOT 1.1 0.9  PROT 7.4 6.6  ALBUMIN 4.8 4.2   No results for input(s): LIPASE, AMYLASE in the last 168 hours. Recent Labs  Lab 03/25/19 0445  AMMONIA 16   Coagulation Profile: Recent Labs  Lab 03/25/19 0430  INR 1.0   Cardiac Enzymes: Recent Labs  Lab 03/20/19 1930  TROPONINI <0.03   BNP (last 3 results) No results for input(s): PROBNP in the last 8760 hours. HbA1C: No results for input(s): HGBA1C in the last 72 hours. CBG: Recent Labs  Lab 03/25/19 0301  GLUCAP 147*   Lipid Profile: No results for input(s): CHOL, HDL, LDLCALC, TRIG, CHOLHDL, LDLDIRECT in the last 72 hours. Thyroid Function Tests: No results for input(s): TSH, T4TOTAL, FREET4, T3FREE, THYROIDAB in the last 72 hours. Anemia Panel: No results for input(s): VITAMINB12, FOLATE, FERRITIN, TIBC, IRON, RETICCTPCT in the last 72 hours. Urine analysis:    Component Value Date/Time   COLORURINE STRAW (A) 03/25/2019 0227   APPEARANCEUR CLEAR 03/25/2019 0227   LABSPEC 1.004 (L) 03/25/2019 0227   PHURINE  6.0 03/25/2019 0227   GLUCOSEU NEGATIVE 03/25/2019 0227   HGBUR NEGATIVE 03/25/2019 0227   BILIRUBINUR NEGATIVE 03/25/2019 0227   KETONESUR NEGATIVE 03/25/2019 0227   PROTEINUR NEGATIVE 03/25/2019 0227   UROBILINOGEN 0.2 09/17/2013 0640   NITRITE NEGATIVE 03/25/2019 0227   LEUKOCYTESUR NEGATIVE 03/25/2019 0227    Radiological Exams on Admission: Ct Head Wo Contrast  Result Date: 03/25/2019 CLINICAL DATA:  Encephalopathy EXAM: CT HEAD WITHOUT CONTRAST TECHNIQUE: Contiguous axial images were obtained from the base of the skull through the vertex without intravenous contrast. COMPARISON:  Head CT 09/17/2013 FINDINGS: Brain: There is no mass, hemorrhage or extra-axial collection. The size and configuration of the ventricles and extra-axial CSF spaces are normal. There is hypoattenuation of the white matter, most commonly indicating chronic small vessel disease. Vascular: No abnormal hyperdensity of the major intracranial arteries or dural venous sinuses. No intracranial atherosclerosis. Skull: The visualized skull base, calvarium and extracranial soft tissues are normal. Sinuses/Orbits: No fluid levels or advanced mucosal thickening of the visualized paranasal sinuses. No mastoid or middle ear effusion. The orbits are normal. IMPRESSION: Chronic small vessel disease without acute intracranial abnormality. Electronically Signed   By: Ulyses Jarred M.D.   On: 03/25/2019 03:31   Dg Chest Portable 1 View  Result Date: 03/25/2019 CLINICAL DATA:  78 year old male with altered mental status. EXAM: PORTABLE CHEST 1 VIEW COMPARISON:  Chest radiograph dated 03/20/2019 FINDINGS: The lungs are clear. There is no pleural effusion or pneumothorax. The cardiac silhouette is within normal limits. Atherosclerotic calcification of the aortic arch. No acute osseous pathology. IMPRESSION: No active disease. Electronically Signed   By: Anner Crete M.D.   On: 03/25/2019 03:22    EKG: Independently reviewed.  Sinus  tachycardia with no acute ST-T wave changes.  Assessment/Plan Active Problems:   Chronic lymphocytic leukemia (HCC)   Colon cancer (HCC)   Syncope   SIRS (systemic inflammatory response syndrome) (HCC)   Hypertensive urgency  Thrombocytopenia (HCC)    Sirs: No source of infection found.  UA and chest x-ray negative.  COVID-19 negative.  Will start him on cefepime, Flagyl and vancomycin for now and follow blood culture and tailor antibiotics accordingly.  He has received IV fluid bolus per sepsis protocol in the ED and I will continue him on normal saline at 125 cc/h.  Admit him to stepdown unit due to persistent tachycardia.  Check procalcitonin.  Strict I's and O's and monitor on telemetry.  Syncope/unconsciousness: CT head unremarkable in the ED except chronic small vessel disease.  I wonder if his rectal cancer has metastasized.  I will proceed with CT chest abdomen and pelvis.  Monitor on telemetry and perform transthoracic echo to rule out any valvular disease.  Prediabetes: He is on metformin at home.  Last known hemoglobin A1c in the chart was only 5.8.  Will hold metformin and start him on SSI.  Hypertensive urgency: Although he carries a diagnosis of hypertension but is not on any medications for that. Patient himself not sure if he is on any medications.  At this point in time, I will start him on hydralazine IV PRN.  Hyperlipidemia: Resume atorvastatin.  Chronic thrombocytopenia: No signs of bleeding.  Stable at baseline.  Repeat labs in the morning.  History of rectal cancer and CLL: Completed therapies in the past.  Currently under surveillance only.  DVT prophylaxis: SCD and Lovenox.  Watch platelets closely.  Currently 91. Code Status: DNR according to patient.  He is alert and oriented currently. Family Communication: No family present.  Discussed in length with patient. Disposition Plan: Likely back to home in next 2 to 3 days. Consults called: None Admission status:  Inpatient   Darliss Cheney MD [facility] Pager [phone]  If 7PM-7AM, please contact night-coverage [email or web address] Password [facility]  03/25/2019, 9:48 AM

## 2019-03-25 NOTE — ED Triage Notes (Signed)
Per EMS, Pt is coming from home. Pt was found unresponsive. When EMS arrived pt was able to follow simple commands, and answer simple questions. Pt is now very lethargic but alert to voice. Pt was recently discharge after being scene from similar conditions. Low WBC count and dehydration when last seen. Hx of leukemia.

## 2019-03-25 NOTE — ED Notes (Signed)
Wife- keegan ducey [phone] please call to let know what room if patient is staying is going to

## 2019-03-25 NOTE — ED Notes (Signed)
He is alert and articulate and in no distress.

## 2019-03-25 NOTE — ED Notes (Signed)
I have just given report to Benjamine Mola, [place] in Step down. Will transport shortly.

## 2019-03-25 NOTE — ED Provider Notes (Signed)
[facility] DEPT Provider Note   CSN: [id number] Arrival date & time: 03/25/19  0207     History   Chief Complaint Chief Complaint  Patient presents with   Altered Mental Status    HPI [name] is a 78 y.o. male.     The history is provided by a relative, the EMS personnel and the patient.  Altered Mental Status Presenting symptoms: lethargy   Severity:  Moderate Most recent episode:  Today Episode history:  Single Timing:  Constant Progression:  Unchanged Chronicity:  New Context: not head injury, taking medications as prescribed, not recent illness and not recent infection   Context comment:  SEEN FOR FATIGUE ON THE 22nd.  Is on daily ambien Associated symptoms: no abdominal pain, no fever, no headaches, no rash, no seizures and no weakness     Past Medical History:  Diagnosis Date   CLL (chronic lymphocytic leukemia) ([place])    chemotherapy   Colon cancer ([place])    Colorectal cancer ([place])    COLORECTAL DX 2/09, treated surgically   Diabetes mellitus    Hypercholesterolemia    Hypertension    Pneumonia    Bilateral pneumonia    Patient Active Problem List   Diagnosis Date Noted   Influenza A 09/18/2013   Syncope 09/17/2013   Diabetes mellitus ([place]) 09/17/2013   Hypertension    Hypercholesterolemia    Chronic lymphocytic leukemia ([place]) 08/01/2011   Colon cancer ([place]) 08/01/2011    Past Surgical History:  Procedure Laterality Date   HEMICOLECTOMY     nasal polyps     S/P nasal surgery        Home Medications    Prior to Admission medications   Medication Sig Start Date End Date Taking? Authorizing Provider  aspirin 81 MG tablet Take 81 mg by mouth every morning.    Yes [provider]  atorvastatin (LIPITOR) 40 MG tablet Take 40 mg by mouth every evening.  01/10/13  Yes [provider]  metFORMIN (GLUCOPHAGE) 1000 MG tablet Take 1,000 mg by mouth 2 (two) times daily with a  meal. 06/05/17  Yes [provider]  Multiple Vitamin (MULTIVITAMIN WITH MINERALS) TABS tablet Take 1 tablet by mouth daily.   Yes [provider]  prochlorperazine (COMPAZINE) 10 MG tablet TAKE 1 TABLET (10 MG TOTAL) BY MOUTH EVERY 6 (SIX) HOURS AS NEEDED FOR NAUSEA OR VOMITING. 03/06/19  Yes Wyatt Portela, MD  oseltamivir (TAMIFLU) 30 MG capsule Take 1 capsule (30 mg total) by mouth 2 (two) times daily. For 5 days Patient not taking: Reported on 03/25/2019 09/18/13   Bonnielee Haff, MD  zolpidem (AMBIEN) 10 MG tablet Take 5-10 mg by mouth at bedtime as needed for sleep.  07/10/17   [provider]    Family History Family History  Problem Relation Age of Onset   Ovarian cancer Mother    Breast cancer Maternal Aunt     Social History Social History   Tobacco Use   Smoking status: Former Smoker    Packs/day: 0.80    Years: 35.00    Pack years: 28.00    Types: Cigarettes   Smokeless tobacco: Never Used  Substance Use Topics   Alcohol use: No   Drug use: No     Allergies   Hydrocodone-acetaminophen   Review of Systems Review of Systems  Constitutional: Positive for fatigue. Negative for fever.  HENT: Negative for rhinorrhea, sinus pressure and sinus pain.   Respiratory: Negative for  cough and shortness of breath.   Cardiovascular: Negative for chest pain.  Gastrointestinal: Negative for abdominal pain.  Skin: Negative for rash.  Neurological: Negative for seizures, facial asymmetry, speech difficulty, weakness and headaches.  All other systems reviewed and are negative.    Physical Exam Updated Vital Signs BP (!) 156/80 (BP Location: Right Arm)    Pulse (!) 136    Temp (!) 97.3 F (36.3 C) (Rectal)    Resp (!) 22    Ht 5\' 7"  (1.702 m)    Wt 59.8 kg    SpO2 96%    BMI 20.65 kg/m   Physical Exam Vitals signs and nursing note reviewed.  Constitutional:      General: He is not in acute distress.    Appearance: He is not  toxic-appearing.     Comments: Preferring to sleep but easily arousable  HENT:     Head: Normocephalic and atraumatic.     Right Ear: Tympanic membrane normal.     Left Ear: Tympanic membrane normal.     Nose: Nose normal.     Mouth/Throat:     Mouth: Mucous membranes are moist.     Pharynx: Oropharynx is clear.  Eyes:     Conjunctiva/sclera: Conjunctivae normal.     Pupils: Pupils are equal, round, and reactive to light.  Neck:     Musculoskeletal: Normal range of motion and neck supple. No neck rigidity.  Cardiovascular:     Rate and Rhythm: Regular rhythm. Tachycardia present.     Pulses: Normal pulses.     Heart sounds: Normal heart sounds.  Pulmonary:     Effort: Pulmonary effort is normal.     Breath sounds: Normal breath sounds. No wheezing.  Abdominal:     General: Abdomen is flat. Bowel sounds are normal.     Tenderness: There is no abdominal tenderness. There is no guarding or rebound.  Musculoskeletal: Normal range of motion.        General: No swelling.  Lymphadenopathy:     Cervical: No cervical adenopathy.  Skin:    General: Skin is warm and dry.     Capillary Refill: Capillary refill takes less than 2 seconds.  Neurological:     General: No focal deficit present.     GCS: GCS eye subscore is 3. GCS verbal subscore is 5. GCS motor subscore is 6.  Psychiatric:        Thought Content: Thought content normal.      ED Treatments / Results  Labs (all labs ordered are listed, but only abnormal results are displayed) Results for orders placed or performed during the hospital encounter of 03/25/19  SARS Coronavirus 2 (CEPHEID - Performed in [facility] hospital lab), [facility] Order   Specimen: Nasopharyngeal Swab  Result Value Ref Range   SARS Coronavirus 2 NEGATIVE NEGATIVE  Comprehensive metabolic panel  Result Value Ref Range   Sodium 143 135 - 145 mmol/L   Potassium 3.7 3.5 - 5.1 mmol/L   Chloride 108 98 - 111 mmol/L   CO2 21 (L) 22 - 32 mmol/L   Glucose,  Bld 148 (H) 70 - 99 mg/dL   BUN 21 8 - 23 mg/dL   Creatinine, Ser 1.37 (H) 0.61 - 1.24 mg/dL   Calcium 9.2 8.9 - 10.3 mg/dL   Total Protein 6.6 6.5 - 8.1 g/dL   Albumin 4.2 3.5 - 5.0 g/dL   AST 16 15 - 41 U/L   ALT 17 0 - 44 U/L  Alkaline Phosphatase 88 38 - 126 U/L   Total Bilirubin 0.9 0.3 - 1.2 mg/dL   GFR calc non Af Amer 50 (L) >60 mL/min   GFR calc Af Amer 58 (L) >60 mL/min   Anion gap 14 5 - 15  Lactic acid, plasma  Result Value Ref Range   Lactic Acid, Venous 3.4 (HH) 0.5 - 1.9 mmol/L  CBC with Differential  Result Value Ref Range   WBC 8.8 4.0 - 10.5 K/uL   RBC 4.19 (L) 4.22 - 5.81 MIL/uL   Hemoglobin 12.2 (L) 13.0 - 17.0 g/dL   HCT 36.6 (L) 39.0 - 52.0 %   MCV 87.4 80.0 - 100.0 fL   MCH 29.1 26.0 - 34.0 pg   MCHC 33.3 30.0 - 36.0 g/dL   RDW 13.6 11.5 - 15.5 %   Platelets 91 (L) 150 - 400 K/uL   nRBC 0.0 0.0 - 0.2 %   Neutrophils Relative % 39 %   Neutro Abs 3.5 1.7 - 7.7 K/uL   Lymphocytes Relative 53 %   Lymphs Abs 4.6 (H) 0.7 - 4.0 K/uL   Monocytes Relative 6 %   Monocytes Absolute 0.5 0.1 - 1.0 K/uL   Eosinophils Relative 2 %   Eosinophils Absolute 0.2 0.0 - 0.5 K/uL   Basophils Relative 0 %   Basophils Absolute 0.0 0.0 - 0.1 K/uL   Immature Granulocytes 0 %   Abs Immature Granulocytes 0.03 0.00 - 0.07 K/uL   Burr Cells PRESENT   Urinalysis, Routine w reflex microscopic  Result Value Ref Range   Color, Urine STRAW (A) YELLOW   APPearance CLEAR CLEAR   Specific Gravity, Urine 1.004 (L) 1.005 - 1.030   pH 6.0 5.0 - 8.0   Glucose, UA NEGATIVE NEGATIVE mg/dL   Hgb urine dipstick NEGATIVE NEGATIVE   Bilirubin Urine NEGATIVE NEGATIVE   Ketones, ur NEGATIVE NEGATIVE mg/dL   Protein, ur NEGATIVE NEGATIVE mg/dL   Nitrite NEGATIVE NEGATIVE   Leukocytes,Ua NEGATIVE NEGATIVE  Salicylate level  Result Value Ref Range   Salicylate Lvl <3.9 2.8 - 30.0 mg/dL  Acetaminophen level  Result Value Ref Range   Acetaminophen (Tylenol), Serum <10 (L) 10 - 30 ug/mL    Ethanol  Result Value Ref Range   Alcohol, Ethyl (B) <10 <10 mg/dL  Ammonia  Result Value Ref Range   Ammonia 16 9 - 35 umol/L  Protime-INR  Result Value Ref Range   Prothrombin Time 12.9 11.4 - 15.2 seconds   INR 1.0 0.8 - 1.2  CBG monitoring, ED  Result Value Ref Range   Glucose-Capillary 147 (H) 70 - 99 mg/dL   Ct Head Wo Contrast  Result Date: 03/25/2019 CLINICAL DATA:  Encephalopathy EXAM: CT HEAD WITHOUT CONTRAST TECHNIQUE: Contiguous axial images were obtained from the base of the skull through the vertex without intravenous contrast. COMPARISON:  Head CT 09/17/2013 FINDINGS: Brain: There is no mass, hemorrhage or extra-axial collection. The size and configuration of the ventricles and extra-axial CSF spaces are normal. There is hypoattenuation of the white matter, most commonly indicating chronic small vessel disease. Vascular: No abnormal hyperdensity of the major intracranial arteries or dural venous sinuses. No intracranial atherosclerosis. Skull: The visualized skull base, calvarium and extracranial soft tissues are normal. Sinuses/Orbits: No fluid levels or advanced mucosal thickening of the visualized paranasal sinuses. No mastoid or middle ear effusion. The orbits are normal. IMPRESSION: Chronic small vessel disease without acute intracranial abnormality. Electronically Signed   By: Cletus Gash.D.  On: 03/25/2019 03:31   Dg Chest Portable 1 View  Result Date: 03/25/2019 CLINICAL DATA:  78 year old male with altered mental status. EXAM: PORTABLE CHEST 1 VIEW COMPARISON:  Chest radiograph dated 03/20/2019 FINDINGS: The lungs are clear. There is no pleural effusion or pneumothorax. The cardiac silhouette is within normal limits. Atherosclerotic calcification of the aortic arch. No acute osseous pathology. IMPRESSION: No active disease. Electronically Signed   By: Anner Crete M.D.   On: 03/25/2019 03:22   Dg Chest Port 1 View  Result Date: 03/20/2019 CLINICAL DATA:   Fatigue, cough, weakness EXAM: PORTABLE CHEST 1 VIEW COMPARISON:  09/17/2013 FINDINGS: The heart size and mediastinal contours are within normal limits. Both lungs are clear. The visualized skeletal structures are unremarkable. Trachea is midline. Aorta atherosclerotic. No acute osseous finding. IMPRESSION: No active disease. Electronically Signed   By: Jerilynn Mages.  Shick M.D.   On: 03/20/2019 20:30    EKG     EKG Interpretation  Date/Time:  Saturday March 25 2019 02:20:58 EDT Ventricular Rate:  129 PR Interval:    QRS Duration: 67 QT Interval:  302 QTC Calculation: 443 R Axis:   38 Text Interpretation:  Sinus tachycardia Low voltage, extremity leads Confirmed by Dory Horn) on 03/25/2019 5:29:06 AM        Radiology Ct Head Wo Contrast  Result Date: 03/25/2019 CLINICAL DATA:  Encephalopathy EXAM: CT HEAD WITHOUT CONTRAST TECHNIQUE: Contiguous axial images were obtained from the base of the skull through the vertex without intravenous contrast. COMPARISON:  Head CT 09/17/2013 FINDINGS: Brain: There is no mass, hemorrhage or extra-axial collection. The size and configuration of the ventricles and extra-axial CSF spaces are normal. There is hypoattenuation of the white matter, most commonly indicating chronic small vessel disease. Vascular: No abnormal hyperdensity of the major intracranial arteries or dural venous sinuses. No intracranial atherosclerosis. Skull: The visualized skull base, calvarium and extracranial soft tissues are normal. Sinuses/Orbits: No fluid levels or advanced mucosal thickening of the visualized paranasal sinuses. No mastoid or middle ear effusion. The orbits are normal. IMPRESSION: Chronic small vessel disease without acute intracranial abnormality. Electronically Signed   By: Ulyses Jarred M.D.   On: 03/25/2019 03:31   Dg Chest Portable 1 View  Result Date: 03/25/2019 CLINICAL DATA:  78 year old male with altered mental status. EXAM: PORTABLE CHEST 1 VIEW  COMPARISON:  Chest radiograph dated 03/20/2019 FINDINGS: The lungs are clear. There is no pleural effusion or pneumothorax. The cardiac silhouette is within normal limits. Atherosclerotic calcification of the aortic arch. No acute osseous pathology. IMPRESSION: No active disease. Electronically Signed   By: Anner Crete M.D.   On: 03/25/2019 03:22    Procedures Procedures (including critical care time)  Medications Ordered in ED Medications  naloxone (NARCAN) 2 MG/2ML injection (has no administration in time range)  ceFEPIme (MAXIPIME) 2 g in sodium chloride 0.9 % 100 mL IVPB (2 g Intravenous New Bag/Given 03/25/19 0507)  metroNIDAZOLE (FLAGYL) IVPB 500 mg (has no administration in time range)  sodium chloride 0.9 % bolus 1,000 mL (1,000 mLs Intravenous New Bag/Given 03/25/19 0506)    And  sodium chloride 0.9 % bolus 1,000 mL (1,000 mLs Intravenous New Bag/Given 03/25/19 0505)  vancomycin (VANCOCIN) 1,250 mg in sodium chloride 0.9 % 250 mL IVPB (has no administration in time range)  sodium chloride flush (NS) 0.9 % injection 3 mL (3 mLs Intravenous Given 03/25/19 0508)  naloxone [facility]) injection 2 mg (2 mg Intravenous Given 03/25/19 0234)     Initial Impression /  Assessment and Plan / ED Course   MDM Reviewed: previous chart, nursing note and vitals Reviewed previous: labs and ECG Interpretation: labs, ECG and x-ray (normal white count elevated lactate no covid  No pna by me on cxr) Total time providing critical care: 75-105 minutes. This excludes time spent performing separately reportable procedures and services. Consults: admitting MD  CRITICAL CARE Performed by: Elliette Seabolt K Zerenity Bowron-Rasch Total critical care time: 90 minutes Critical care time was exclusive of separately billable procedures and treating other patients. Critical care was necessary to treat or prevent imminent or life-threatening deterioration. Critical care was time spent personally by me on the following activities:  development of treatment plan with patient and/or surrogate as well as nursing, discussions with consultants, evaluation of patient's response to treatment, examination of patient, obtaining history from patient or surrogate, ordering and performing treatments and interventions, ordering and review of laboratory studies, ordering and review of radiographic studies, pulse oximetry and re-evaluation of patient's condition.   Final Clinical Impressions(s) / ED Diagnoses   Final diagnoses:  Altered mental status, unspecified altered mental status type  SIRS (systemic inflammatory response syndrome) (HCC)  Adverse effect of drug, initial encounter    Admit to medicine, suspect Lorrin Mais is partially to blame for lethargy    Unique Sillas, MD 03/25/19 0530

## 2019-03-25 NOTE — ED Notes (Signed)
He is awake, and slightly drowsy and in no distress. He informs me that he has some memory lapse over the past several hours. He is able to tell me where he lives and his spouses's name and that he is at hospital. He enquires as to "why am I here?" and I inform him that he has a presumed infection, and he has improved with fluids and antibiotics. I also inform him of plan for admission to hospital, with which he agrees.

## 2019-03-25 NOTE — Progress Notes (Signed)
Pharmacy Antibiotic Note  [name] is a 78 y.o. male admitted on 03/25/2019 with altered mental status and elevated lactic acid, r/o sepsis.  Code Sepsis was called and patient received in ED: cefepime 2 g x 1, vancomycin 1250 mg x 1, metronidazole 500 mg x 1.  Pharmacy has been consulted for cefepime and vancomycin dosing.  PMH includes CLL, DM.  Plan: Continue cefepime at 1 g q12h (adjusted for renal function), next dose 1800 Continue vancomycin at 750 mg q24h, next dose 1000 6/28; estimated AUC 476 using SCr 1.37, wt 59.8 kg. Follow culture results, renal function, clinical course, potential for de-escalation.  Height: 5\' 7"  (170.2 cm) Weight: 131 lb 13.4 oz (59.8 kg) IBW/kg (Calculated) : 66.1  Temp (24hrs), Avg:97.4 F (36.3 C), Min:97.3 F (36.3 C), Max:97.4 F (36.3 C)  Recent Labs  Lab 03/20/19 1930 03/25/19 0227 03/25/19 0445  WBC 9.5 8.8  --   CREATININE 1.50* 1.37*  --   LATICACIDVEN  --  3.4* 2.1*    Estimated Creatinine Clearance: 38.8 mL/min (A) (by C-G formula based on SCr of 1.37 mg/dL (H)).    Allergies  Allergen Reactions  . Hydrocodone-Acetaminophen Other (See Comments)    Loss of weight   Antimicrobials this admission: 6/27 cefepime >>  6/27 metronidazole x1 dose 6/27 vancomycin >>   Microbiology results: 6/27 SARSCoV2: neg 6/27 BCx: collected  Thank you for allowing pharmacy to be a part of this patient's care.  Clayburn Pert, PharmD, BCPS [phone] 03/25/2019  8:53 AM

## 2019-03-25 NOTE — ED Notes (Signed)
Bed: UV22 Expected date:  Expected time:  Means of arrival:  Comments: EMS 78 yo male decreased responsiveness

## 2019-03-25 NOTE — ED Notes (Signed)
He is sitting upright capably without assistance, eating his breakfast. He is in no distress.

## 2019-03-25 NOTE — Progress Notes (Signed)
Assisted tele visit to patient with family member.  Donnah Levert M, RN  

## 2019-03-25 NOTE — ED Notes (Signed)
ED TO INPATIENT HANDOFF REPORT  Name/Age/Gender [name] 78 y.o. male  Code Status    Code Status Orders  (From admission, onward)         Start     Ordered   03/25/19 0927  Do not attempt resuscitation (DNR)  Continuous    Question Answer Comment  In the event of cardiac or respiratory ARREST Do not call a "code blue"   In the event of cardiac or respiratory ARREST Do not perform Intubation, CPR, defibrillation or ACLS   In the event of cardiac or respiratory ARREST Use medication by any route, position, wound care, and other measures to relive pain and suffering. May use oxygen, suction and manual treatment of airway obstruction as needed for comfort.      03/25/19 0926        Code Status History    Date Active Date Inactive Code Status Order ID Comments User Context   09/17/2013 0931 09/18/2013 1518 DNR [id number]  Janece Canterbury, MD Inpatient   Advance Care Planning Activity    Advance Directive Documentation     Most Recent Value  Type of Advance Directive  Healthcare Power of [place], Living will  Pre-existing out of facility DNR order (yellow form or pink MOST form)  -  "MOST" Form in Place?  -      Home/SNF/Other Home  Chief Complaint lethargic  Level of Care/Admitting Diagnosis ED Disposition    ED Disposition Condition [place]: [place] [[id number]]  Level of Care: Stepdown [14]  Admit to SDU based on following criteria: Hemodynamic compromise or significant risk of instability:  Patient requiring short term acute titration and management of vasoactive drips, and invasive monitoring (i.e., CVP and Arterial line).  Covid Evaluation: Confirmed COVID Negative  Diagnosis: SIRS (systemic inflammatory response syndrome) [facility]) [[id number]]  Admitting Physician: Darliss Cheney [[id number]]  Attending Physician: Darliss Cheney [phone]  Estimated length of stay: 3 - 4 days  Certification:: I certify this patient will  need inpatient services for at least 2 midnights  PT Class (Do Not Modify): Inpatient [101]  PT Acc Code (Do Not Modify): Private [1]       Medical History Past Medical History:  Diagnosis Date  . CLL (chronic lymphocytic leukemia) (HCC)    chemotherapy  . Colon cancer ([place])   . Colorectal cancer ([place])    COLORECTAL DX 2/09, treated surgically  . Diabetes mellitus   . Hypercholesterolemia   . Hypertension   . Pneumonia    Bilateral pneumonia    Allergies Allergies  Allergen Reactions  . Hydrocodone-Acetaminophen Other (See Comments)    Loss of weight    IV Location/Drains/Wounds Patient Lines/Drains/Airways Status   Active Line/Drains/Airways    Name:   Placement date:   Placement time:   Site:   Days:   Peripheral IV 03/25/19 Left Antecubital   03/25/19    0215    Antecubital   less than 1   Peripheral IV 03/25/19 Right;Lateral;Distal Forearm   03/25/19    0200    Forearm   less than 1          Labs/Imaging Results for orders placed or performed during the hospital encounter of 03/25/19 (from the past 48 hour(s))  Comprehensive metabolic panel     Status: Abnormal   Collection Time: 03/25/19  2:27 AM  Result Value Ref Range   Sodium 143 135 - 145 mmol/L   Potassium 3.7 3.5 - 5.1 mmol/L  Chloride 108 98 - 111 mmol/L   CO2 21 (L) 22 - 32 mmol/L   Glucose, Bld 148 (H) 70 - 99 mg/dL   BUN 21 8 - 23 mg/dL   Creatinine, Ser 1.37 (H) 0.61 - 1.24 mg/dL   Calcium 9.2 8.9 - 10.3 mg/dL   Total Protein 6.6 6.5 - 8.1 g/dL   Albumin 4.2 3.5 - 5.0 g/dL   AST 16 15 - 41 U/L   ALT 17 0 - 44 U/L   Alkaline Phosphatase 88 38 - 126 U/L   Total Bilirubin 0.9 0.3 - 1.2 mg/dL   GFR calc non Af Amer 50 (L) >60 mL/min   GFR calc Af Amer 58 (L) >60 mL/min   Anion gap 14 5 - 15    Comment: Performed at [facility], [place] [address], [place], [place] [address]  Lactic acid, plasma     Status: Abnormal   Collection Time: 03/25/19  2:27 AM  Result Value Ref  Range   Lactic Acid, Venous 3.4 (HH) 0.5 - 1.9 mmol/L    Comment: CRITICAL RESULT CALLED TO, READ BACK BY AND VERIFIED WITH: GRIGG,J RN @6 /27/2020 JACKSON,K Performed at [facility], [place] [address] [place], [place] [address]   CBC with Differential     Status: Abnormal   Collection Time: 03/25/19  2:27 AM  Result Value Ref Range   WBC 8.8 4.0 - 10.5 K/uL   RBC 4.19 (L) 4.22 - 5.81 MIL/uL   Hemoglobin 12.2 (L) 13.0 - 17.0 g/dL   HCT 36.6 (L) 39.0 - 52.0 %   MCV 87.4 80.0 - 100.0 fL   MCH 29.1 26.0 - 34.0 pg   MCHC 33.3 30.0 - 36.0 g/dL   RDW 13.6 11.5 - 15.5 %   Platelets 91 (L) 150 - 400 K/uL    Comment: REPEATED TO VERIFY PLATELET COUNT CONFIRMED BY SMEAR SPECIMEN CHECKED FOR CLOTS Immature Platelet Fraction may be clinically indicated, consider ordering this additional test [id number]    nRBC 0.0 0.0 - 0.2 %   Neutrophils Relative % 39 %   Neutro Abs 3.5 1.7 - 7.7 K/uL   Lymphocytes Relative 53 %   Lymphs Abs 4.6 (H) 0.7 - 4.0 K/uL   Monocytes Relative 6 %   Monocytes Absolute 0.5 0.1 - 1.0 K/uL   Eosinophils Relative 2 %   Eosinophils Absolute 0.2 0.0 - 0.5 K/uL   Basophils Relative 0 %   Basophils Absolute 0.0 0.0 - 0.1 K/uL   Immature Granulocytes 0 %   Abs Immature Granulocytes 0.03 0.00 - 0.07 K/uL   Burr Cells PRESENT     Comment: Performed at [facility], [place] [address] [place], [place] [address]  Culture, blood (Routine x 2)     Status: None (Preliminary result)   Collection Time: 03/25/19  2:27 AM   Specimen: BLOOD  Result Value Ref Range   Specimen Description      BLOOD RIGHT ANTECUBITAL Performed at [place] [address] [place], [place] [address]    Special Requests      BOTTLES DRAWN AEROBIC AND ANAEROBIC Blood Culture adequate volume Performed at [place] [address], [place], [place] [address]    Culture      NO GROWTH < 12 HOURS Performed at [facility] [address] [place], [place] [address]    Report Status PENDING   Urinalysis, Routine w reflex microscopic  Status: Abnormal   Collection Time: 03/25/19  2:27 AM  Result Value Ref Range   Color, Urine STRAW (A) YELLOW   APPearance CLEAR CLEAR   Specific Gravity, Urine 1.004 (L) 1.005 - 1.030   pH 6.0 5.0 - 8.0   Glucose, UA NEGATIVE NEGATIVE mg/dL   Hgb urine dipstick NEGATIVE NEGATIVE   Bilirubin Urine NEGATIVE NEGATIVE   Ketones, ur NEGATIVE NEGATIVE mg/dL   Protein, ur NEGATIVE NEGATIVE mg/dL   Nitrite NEGATIVE NEGATIVE   Leukocytes,Ua NEGATIVE NEGATIVE    Comment: Performed at [facility] [address], [place], [place] [address]  Culture, blood (Routine x 2)     Status: None (Preliminary result)   Collection Time: 03/25/19  2:32 AM   Specimen: BLOOD  Result Value Ref Range   Specimen Description      BLOOD LEFT ANTECUBITAL Performed at [facility], [place] [address], [place], [place] [address]    Special Requests      BOTTLES DRAWN AEROBIC AND ANAEROBIC Blood Culture adequate volume Performed at [facility] [address] [place], [place] [address]    Culture      NO GROWTH < 12 HOURS Performed at [facility] [address], [place], [place] [address]    Report Status PENDING   SARS Coronavirus 2 (CEPHEID - Performed in [place] hospital lab), Hosp Order     Status: None   Collection Time: 03/25/19  2:43 AM   Specimen: Nasopharyngeal Swab  Result Value Ref Range   SARS Coronavirus 2 NEGATIVE NEGATIVE    Comment: (NOTE) If result is NEGATIVE SARS-CoV-2 target nucleic acids are NOT DETECTED. The SARS-CoV-2 RNA is generally detectable in upper and lower  respiratory specimens during the acute phase of infection. The lowest  concentration of SARS-CoV-2 viral copies this assay can detect is 250  copies / mL. A negative result does not preclude SARS-CoV-2 infection  and should  not be used as the sole basis for treatment or other  patient management decisions.  A negative result may occur with  improper specimen collection / handling, submission of specimen other  than nasopharyngeal swab, presence of viral mutation(s) within the  areas targeted by this assay, and inadequate number of viral copies  (<250 copies / mL). A negative result must be combined with clinical  observations, patient history, and epidemiological information. If result is POSITIVE SARS-CoV-2 target nucleic acids are DETECTED. The SARS-CoV-2 RNA is generally detectable in upper and lower  respiratory specimens dur ing the acute phase of infection.  Positive  results are indicative of active infection with SARS-CoV-2.  Clinical  correlation with patient history and other diagnostic information is  necessary to determine patient infection status.  Positive results do  not rule out bacterial infection or co-infection with other viruses. If result is PRESUMPTIVE POSTIVE SARS-CoV-2 nucleic acids MAY BE PRESENT.   A presumptive positive result was obtained on the submitted specimen  and confirmed on repeat testing.  While 2019 novel coronavirus  (SARS-CoV-2) nucleic acids may be present in the submitted sample  additional confirmatory testing may be necessary for epidemiological  and / or clinical management purposes  to differentiate between  SARS-CoV-2 and other Sarbecovirus currently known to infect humans.  If clinically indicated additional testing with an alternate test  methodology [phone]) is advised. The SARS-CoV-2 RNA is generally  detectable in upper and lower respiratory sp ecimens during the acute  phase of infection. The expected result  is Negative. Fact Sheet for Patients:  StrictlyIdeas.no Fact Sheet for Healthcare Providers: BankingDealers.co.za This test is not yet approved or cleared by the [place] FDA and has been  authorized for detection and/or diagnosis of SARS-CoV-2 by FDA under an Emergency Use Authorization (EUA).  This EUA will remain in effect (meaning this test can be used) for the duration of the COVID-19 declaration under Section 564(b)(1) of the Act, 21 U.S.C. section 360bbb-3(b)(1), unless the authorization is terminated or revoked sooner. Performed at [facility], [place] [address] [place], [place] [address]   CBG monitoring, ED     Status: Abnormal   Collection Time: 03/25/19  3:01 AM  Result Value Ref Range   Glucose-Capillary 147 (H) 70 - 99 mg/dL  Salicylate level     Status: None   Collection Time: 03/25/19  4:30 AM  Result Value Ref Range   Salicylate Lvl <3.7 2.8 - 30.0 mg/dL    Comment: Performed at [facility], [place] [address], [place], [place] [address]  Acetaminophen level     Status: Abnormal   Collection Time: 03/25/19  4:30 AM  Result Value Ref Range   Acetaminophen (Tylenol), Serum <10 (L) 10 - 30 ug/mL    Comment: (NOTE) Therapeutic concentrations vary significantly. A range of 10-30 ug/mL  may be an effective concentration for many patients. However, some  are best treated at concentrations outside of this range. Acetaminophen concentrations >150 ug/mL at 4 hours after ingestion  and >50 ug/mL at 12 hours after ingestion are often associated with  toxic reactions. Performed at [facility], [place] [address] [place], [place] [address]   Ethanol     Status: None   Collection Time: 03/25/19  4:30 AM  Result Value Ref Range   Alcohol, Ethyl (B) <10 <10 mg/dL    Comment: (NOTE) Lowest detectable limit for serum alcohol is 10 mg/dL. For medical purposes only. Performed at [facility], [place] [address] [place], [place] [address]   Protime-INR     Status: None   Collection Time: 03/25/19  4:30 AM  Result Value Ref Range   Prothrombin Time 12.9 11.4 - 15.2 seconds   INR 1.0 0.8 - 1.2     Comment: (NOTE) INR goal varies based on device and disease states. Performed at [facility], [place] [address], [place], [place] [address]   Lactic acid, plasma     Status: Abnormal   Collection Time: 03/25/19  4:45 AM  Result Value Ref Range   Lactic Acid, Venous 2.1 (HH) 0.5 - 1.9 mmol/L    Comment: CRITICAL RESULT CALLED TO, READ BACK BY AND VERIFIED WITH: A BELEW,RN 03/25/19 0538 RHOLMES Performed at [facility], [place] [address], [place], [place] [address]   Ammonia     Status: None   Collection Time: 03/25/19  4:45 AM  Result Value Ref Range   Ammonia 16 9 - 35 umol/L    Comment: Performed at [facility], [place] [address] [place], [place] [address]  Urine rapid drug screen (hosp performed)     Status: None   Collection Time: 03/25/19  9:41 AM  Result Value Ref Range   Opiates NONE DETECTED NONE DETECTED   Cocaine NONE DETECTED NONE DETECTED   Benzodiazepines NONE DETECTED NONE DETECTED   Amphetamines NONE DETECTED NONE DETECTED   Tetrahydrocannabinol NONE DETECTED NONE DETECTED   Barbiturates NONE DETECTED NONE DETECTED    Comment: (NOTE) DRUG SCREEN  FOR MEDICAL PURPOSES ONLY.  IF CONFIRMATION IS NEEDED FOR ANY PURPOSE, NOTIFY LAB WITHIN 5 DAYS. LOWEST DETECTABLE LIMITS FOR URINE DRUG SCREEN Drug Class                     Cutoff (ng/mL) Amphetamine and metabolites    1000 Barbiturate and metabolites    200 Benzodiazepine                 338 Tricyclics and metabolites     300 Opiates and metabolites        300 Cocaine and metabolites        300 THC                            50 Performed at [facility], [place] [address] [place], [place] [address]   Magnesium     Status: Abnormal   Collection Time: 03/25/19  9:41 AM  Result Value Ref Range   Magnesium 1.5 (L) 1.7 - 2.4 mg/dL    Comment: Performed at [facility], [place] [address], [place], [place] [address]  TSH     Status:  None   Collection Time: 03/25/19  9:41 AM  Result Value Ref Range   TSH 1.827 0.350 - 4.500 uIU/mL    Comment: Performed by a 3rd Generation assay with a functional sensitivity of <=0.01 uIU/mL. Performed at [facility], [place] [address], [place], [place] [address]   Lactic acid, plasma     Status: None   Collection Time: 03/25/19  9:41 AM  Result Value Ref Range   Lactic Acid, Venous 1.4 0.5 - 1.9 mmol/L    Comment: Performed at [facility], [place] [address], [place], [place] [address]  CBG monitoring, ED     Status: Abnormal   Collection Time: 03/25/19 11:23 AM  Result Value Ref Range   Glucose-Capillary 241 (H) 70 - 99 mg/dL   Ct Head Wo Contrast  Result Date: 03/25/2019 CLINICAL DATA:  Encephalopathy EXAM: CT HEAD WITHOUT CONTRAST TECHNIQUE: Contiguous axial images were obtained from the base of the skull through the vertex without intravenous contrast. COMPARISON:  Head CT 09/17/2013 FINDINGS: Brain: There is no mass, hemorrhage or extra-axial collection. The size and configuration of the ventricles and extra-axial CSF spaces are normal. There is hypoattenuation of the white matter, most commonly indicating chronic small vessel disease. Vascular: No abnormal hyperdensity of the major intracranial arteries or dural venous sinuses. No intracranial atherosclerosis. Skull: The visualized skull base, calvarium and extracranial soft tissues are normal. Sinuses/Orbits: No fluid levels or advanced mucosal thickening of the visualized paranasal sinuses. No mastoid or middle ear effusion. The orbits are normal. IMPRESSION: Chronic small vessel disease without acute intracranial abnormality. Electronically Signed   By: Ulyses Jarred M.D.   On: 03/25/2019 03:31   Dg Chest Portable 1 View  Result Date: 03/25/2019 CLINICAL DATA:  78 year old male with altered mental status. EXAM: PORTABLE CHEST 1 VIEW COMPARISON:  Chest radiograph dated 03/20/2019 FINDINGS: The lungs  are clear. There is no pleural effusion or pneumothorax. The cardiac silhouette is within normal limits. Atherosclerotic calcification of the aortic arch. No acute osseous pathology. IMPRESSION: No active disease. Electronically Signed   By: Anner Crete M.D.   On: 03/25/2019 03:22    Pending Labs Unresulted Labs (From admission, onward)    Start     Ordered   04/01/19 0500  Creatinine, serum  (  enoxaparin (LOVENOX)    CrCl >/= 30 ml/min)  Weekly,   R    Comments: while on enoxaparin therapy    03/25/19 0926   03/26/19 0500  Protime-INR  Tomorrow morning,   R     03/25/19 0926   03/26/19 0500  Cortisol-am, blood  Tomorrow morning,   R     03/25/19 0926   03/26/19 0500  Procalcitonin  Tomorrow morning,   R     03/25/19 0926   03/26/19 0500  Comprehensive metabolic panel  Tomorrow morning,   R     03/25/19 0926   03/26/19 0500  CBC  Tomorrow morning,   R     03/25/19 0926   03/25/19 0446  Blood Culture (routine x 2)  BLOOD CULTURE X 2,   STAT     03/25/19 0446   Signed and Held  CBC  (enoxaparin (LOVENOX)    CrCl >/= 30 ml/min)  Once,   R    Comments: Baseline for enoxaparin therapy IF NOT ALREADY DRAWN.  Notify MD if PLT < 100 K.    Signed and Held   Signed and Held  Creatinine, serum  (enoxaparin (LOVENOX)    CrCl >/= 30 ml/min)  Once,   R    Comments: Baseline for enoxaparin therapy IF NOT ALREADY DRAWN.    Signed and Held          Vitals/Pain Today's Vitals   03/25/19 1030 03/25/19 1200 03/25/19 1300 03/25/19 1400  BP: (!) 158/72 (!) 167/81 (!) 168/94 (!) 168/75  Pulse:  (!) 106  95  Resp: 18 20 (!) 23 19  Temp:  97.9 F (36.6 C)    TempSrc:  Oral    SpO2:  100%  100%  Weight:      Height:      PainSc:  0-No pain      Isolation Precautions No active isolations  Medications Medications  naloxone (NARCAN) 2 MG/2ML injection (has no administration in time range)  aspirin chewable tablet 81 mg (81 mg Oral Given 03/25/19 1208)  atorvastatin (LIPITOR) tablet 40  mg (has no administration in time range)  prochlorperazine (COMPAZINE) tablet 10 mg (has no administration in time range)  enoxaparin (LOVENOX) injection 40 mg (40 mg Subcutaneous Given 03/25/19 1208)  0.9 % NaCl with KCl 20 mEq/ L  infusion ( Intravenous New Bag/Given 03/25/19 1006)  ceFEPIme (MAXIPIME) 2 g in sodium chloride 0.9 % 100 mL IVPB (has no administration in time range)  metroNIDAZOLE (FLAGYL) IVPB 500 mg ( Intravenous Canceled Entry 03/25/19 1800)  acetaminophen (TYLENOL) tablet 650 mg (has no administration in time range)    Or  acetaminophen (TYLENOL) suppository 650 mg (has no administration in time range)  ondansetron (ZOFRAN) tablet 4 mg (has no administration in time range)    Or  ondansetron (ZOFRAN) injection 4 mg (has no administration in time range)  albuterol (PROVENTIL) (2.5 MG/3ML) 0.083% nebulizer solution 2.5 mg (has no administration in time range)  insulin aspart (novoLOG) injection 0-9 Units (3 Units Subcutaneous Given 03/25/19 1209)  insulin aspart (novoLOG) injection 0-5 Units (has no administration in time range)  ceFEPIme (MAXIPIME) 1 g in sodium chloride 0.9 % 100 mL IVPB (has no administration in time range)  vancomycin (VANCOCIN) IVPB 750 mg/150 ml premix (has no administration in time range)  sodium chloride flush (NS) 0.9 % injection 3 mL (3 mLs Intravenous Given 03/25/19 0508)  naloxone (NARCAN) injection 2 mg (2 mg Intravenous Given 03/25/19 0234)  ceFEPIme (  MAXIPIME) 2 g in sodium chloride 0.9 % 100 mL IVPB (0 g Intravenous Stopped 03/25/19 0540)  metroNIDAZOLE (FLAGYL) IVPB 500 mg (0 mg Intravenous Stopped 03/25/19 0647)  sodium chloride 0.9 % bolus 1,000 mL (0 mLs Intravenous Stopped 03/25/19 0600)    And  sodium chloride 0.9 % bolus 1,000 mL (0 mLs Intravenous Stopped 03/25/19 0725)  vancomycin (VANCOCIN) 1,250 mg in sodium chloride 0.9 % 250 mL IVPB (0 mg Intravenous Stopped 03/25/19 0754)    Mobility walks

## 2019-03-25 NOTE — ED Notes (Signed)
Critical - Lactic acid 2.1

## 2019-03-25 NOTE — Progress Notes (Signed)
Patient asking for his [name] to be ordered ; he takes 10mg  at night time jat home: paged [facility] night coverage

## 2019-03-25 NOTE — ED Notes (Signed)
I have reviewed held orders; and find all requisite fluids/antibiotics to have been given.

## 2019-03-26 ENCOUNTER — Inpatient Hospital Stay (HOSPITAL_COMMUNITY): Payer: Medicare Other

## 2019-03-26 DIAGNOSIS — F5101 Primary insomnia: Secondary | ICD-10-CM

## 2019-03-26 DIAGNOSIS — R55 Syncope and collapse: Secondary | ICD-10-CM

## 2019-03-26 LAB — BLOOD CULTURE ID PANEL (REFLEXED)

## 2019-03-26 LAB — COMPREHENSIVE METABOLIC PANEL
ALT: 16 U/L (ref 0–44)
AST: 16 U/L (ref 15–41)
Albumin: 4 g/dL (ref 3.5–5.0)
Alkaline Phosphatase: 88 U/L (ref 38–126)
Anion gap: 10 (ref 5–15)
BUN: 11 mg/dL (ref 8–23)
CO2: 19 mmol/L — ABNORMAL LOW (ref 22–32)
Calcium: 8.9 mg/dL (ref 8.9–10.3)
Chloride: 114 mmol/L — ABNORMAL HIGH (ref 98–111)
Creatinine, Ser: 1.06 mg/dL (ref 0.61–1.24)
GFR calc Af Amer: >60 mL/min (ref >60)
GFR calc non Af Amer: >60 mL/min (ref >60)
Glucose, Bld: 144 mg/dL — ABNORMAL HIGH (ref 70–99)
Potassium: 3.4 mmol/L — ABNORMAL LOW (ref 3.5–5.1)
Sodium: 143 mmol/L (ref 135–145)
Total Bilirubin: 1 mg/dL (ref 0.3–1.2)
Total Protein: 6.3 g/dL — ABNORMAL LOW (ref 6.5–8.1)

## 2019-03-26 LAB — PROTIME-INR
INR: 1 (ref 0.8–1.2)
Prothrombin Time: 12.9 seconds (ref 11.4–15.2)

## 2019-03-26 LAB — GLUCOSE, CAPILLARY
Glucose-Capillary: 132 mg/dL — ABNORMAL HIGH (ref 70–99)
Glucose-Capillary: 259 mg/dL — ABNORMAL HIGH (ref 70–99)
Glucose-Capillary: 113 mg/dL — ABNORMAL HIGH (ref 70–99)
Glucose-Capillary: 167 mg/dL — ABNORMAL HIGH (ref 70–99)

## 2019-03-26 LAB — CBC
HCT: 34.5 % — ABNORMAL LOW (ref 39.0–52.0)
Hemoglobin: 11.6 g/dL — ABNORMAL LOW (ref 13.0–17.0)
MCH: 28.8 pg (ref 26.0–34.0)
MCHC: 33.6 g/dL (ref 30.0–36.0)
MCV: 85.6 fL (ref 80.0–100.0)
Platelets: 96 10*3/uL — ABNORMAL LOW (ref 150–400)
RBC: 4.03 MIL/uL — ABNORMAL LOW (ref 4.22–5.81)
RDW: 13.5 % (ref 11.5–15.5)
WBC: 8.1 10*3/uL (ref 4.0–10.5)
nRBC: 0 % (ref 0.0–0.2)

## 2019-03-26 LAB — CORTISOL-AM, BLOOD: Cortisol - AM: 15.7 ug/dL (ref 6.7–22.6)

## 2019-03-26 LAB — LACTIC ACID, PLASMA: Lactic Acid, Venous: 3.4 mmol/L (ref 0.5–1.9)

## 2019-03-26 LAB — PROCALCITONIN: Procalcitonin: <0.1 ng/mL

## 2019-03-26 MED ORDER — GUAIFENESIN-DM 100-10 MG/5ML PO SYRP: 5.0000 mL | ORAL_SOLUTION | ORAL | Status: DC | PRN

## 2019-03-26 MED ORDER — TRAZODONE HCL 50 MG PO TABS
50.0000 mg | ORAL_TABLET | Freq: Every evening | ORAL | Status: DC | PRN
  Administered 2019-03-26: 50 mg via ORAL
  Filled 2019-03-26: qty 1

## 2019-03-26 MED ORDER — IPRATROPIUM-ALBUTEROL 0.5-2.5 (3) MG/3ML IN SOLN: 3.0000 mL | RESPIRATORY_TRACT | Status: DC | PRN

## 2019-03-26 MED ORDER — POTASSIUM CHLORIDE CRYS ER 20 MEQ PO TBCR
40.0000 meq | EXTENDED_RELEASE_TABLET | Freq: Once | ORAL | Status: AC
  Administered 2019-03-26: 40 meq via ORAL
  Filled 2019-03-26: qty 2

## 2019-03-26 MED ORDER — MAGNESIUM OXIDE 400 (241.3 MG) MG PO TABS
800.0000 mg | ORAL_TABLET | Freq: Once | ORAL | Status: AC
  Administered 2019-03-26: 09:00:00 800 mg via ORAL
  Filled 2019-03-26: qty 2

## 2019-03-26 MED ORDER — PHENOL 1.4 % MT LIQD
1.0000 | OROMUCOSAL | Status: DC | PRN
  Filled 2019-03-26: qty 177

## 2019-03-26 MED ORDER — SENNOSIDES-DOCUSATE SODIUM 8.6-50 MG PO TABS: 2.0000 | ORAL_TABLET | Freq: Every evening | ORAL | Status: DC | PRN

## 2019-03-26 MED ORDER — HYDROCORTISONE 1 % EX CREA
1.0000 "application " | TOPICAL_CREAM | Freq: Three times a day (TID) | CUTANEOUS | Status: DC | PRN
  Filled 2019-03-26: qty 28

## 2019-03-26 MED ORDER — LIP MEDEX EX OINT: 1.0000 "application " | TOPICAL_OINTMENT | CUTANEOUS | Status: DC | PRN

## 2019-03-26 MED ORDER — POLYETHYLENE GLYCOL 3350 17 G PO PACK: 17.0000 g | PACK | Freq: Every day | ORAL | Status: DC | PRN

## 2019-03-26 MED ORDER — HYDROCORTISONE (PERIANAL) 2.5 % EX CREA
1.0000 "application " | TOPICAL_CREAM | Freq: Four times a day (QID) | CUTANEOUS | Status: DC | PRN
  Filled 2019-03-26: qty 28.35

## 2019-03-26 MED ORDER — CHLORHEXIDINE GLUCONATE CLOTH 2 % EX PADS
6.0000 | MEDICATED_PAD | Freq: Every day | CUTANEOUS | Status: DC
  Administered 2019-03-26: 6 via TOPICAL

## 2019-03-26 MED ORDER — HYDRALAZINE HCL 20 MG/ML IJ SOLN: 10.0000 mg | INTRAMUSCULAR | Status: DC | PRN

## 2019-03-26 MED ORDER — POLYVINYL ALCOHOL 1.4 % OP SOLN
1.0000 [drp] | OPHTHALMIC | Status: DC | PRN
  Filled 2019-03-26: qty 15

## 2019-03-26 MED ORDER — LORATADINE 10 MG PO TABS: 10.0000 mg | ORAL_TABLET | Freq: Every day | ORAL | Status: DC | PRN

## 2019-03-26 MED ORDER — SALINE SPRAY 0.65 % NA SOLN
1.0000 | NASAL | Status: DC | PRN
  Filled 2019-03-26: qty 44

## 2019-03-26 MED ORDER — MUSCLE RUB 10-15 % EX CREA
1.0000 "application " | TOPICAL_CREAM | CUTANEOUS | Status: DC | PRN
  Filled 2019-03-26: qty 85

## 2019-03-26 MED ORDER — METOPROLOL TARTRATE 25 MG PO TABS
25.0000 mg | ORAL_TABLET | Freq: Two times a day (BID) | ORAL | Status: DC
  Administered 2019-03-26 – 2019-03-27 (×3): 25 mg via ORAL
  Filled 2019-03-26 (×3): qty 1

## 2019-03-26 MED ORDER — ALUM & MAG HYDROXIDE-SIMETH 200-200-20 MG/5ML PO SUSP: 30.0000 mL | ORAL | Status: DC | PRN

## 2019-03-26 MED ORDER — DEXTROSE-NACL 5-0.45 % IV SOLN
INTRAVENOUS | Status: AC
  Administered 2019-03-26 – 2019-03-27 (×2): via INTRAVENOUS

## 2019-03-26 NOTE — Progress Notes (Signed)
PROGRESS NOTE    [name]  HEN:[id number] DOB: 01/15/1941 DOA: 03/25/2019 PCP: [name], MD   Brief Narrative:  78 year old with history of CLL diagnosed in August rectal cancer, chronic thrombocytopenia, coronary artery disease, essential hypertension was brought to the hospital for evaluation of unresponsiveness.  So far work-up has been negative, no signs of infection, UDS is negative.  Culture data is negative.  TSH within normal limits, UDS negative.   Assessment & Plan:   Active Problems:   Chronic lymphocytic leukemia (HCC)   Colon cancer (HCC)   Syncope   SIRS (systemic inflammatory response syndrome) (HCC)   Hypertensive urgency   Thrombocytopenia (HCC)  Unresponsive-syncope -Unclear etiology at this time.  No obvious signs of infection.  UDS is negative.  No evidence of alcohol use.  Possible from Ambien versus complex arrhythmia/valvular disease versus pulmonary embolism - TSH within normal limits, UDS negative, no signs of infection. - Procalcitonin negative, discontinue antibiotics - Echocardiogram-ordered - Carotid ultrasound-ordered - Hold off on CTA chest to rule out PE, just received contrast yesterday also he is not short of breath.  He does have slight tachycardia.  Sinus tachycardia -Tells me he has had this for some time.  He also has elevated blood pressure.  Will start him on metoprolol twice daily 25 mg.  Insomnia -We will discontinue Ambien.  Trazodone as needed bedtime  History of prediabetes - Previous hemoglobin A1c was 5.8.  Hyperlipidemia -Continue Lipitor 40 mg  Uncontrolled blood pressure -Metoprolol 25 mg twice daily started  Chronic thrombocytopenia -On baseline of 96  History of rectal cancer and CML - Currently under surveillance.  Follow-up outpatient.  DVT prophylaxis: Lovenox, monitor for thrombocytopenia Code Status: DNR Family Communication: None at bedside Disposition Plan: Transfer patient to telemetry floor.   Maintain hospital stay for further work-up concerns for complex arrhythmia/valvular disease.  Consultants:   None  Procedures:   None  Antimicrobials:   Discontinued antibiotics 6/28   Subjective: States he feels much better this morning.  Although his heart rate is not 110-120, he is not feeling any palpitations.  He denies any alcohol, tobacco or illicit drug use at home.  Review of Systems Otherwise negative except as per HPI, including: General: Denies fever, chills, night sweats or unintended weight loss. Resp: Denies cough, wheezing, shortness of breath. Cardiac: Denies chest pain, palpitations, orthopnea, paroxysmal nocturnal dyspnea. GI: Denies abdominal pain, nausea, vomiting, diarrhea or constipation GU: Denies dysuria, frequency, hesitancy or incontinence MS: Denies muscle aches, joint pain or swelling Neuro: Denies headache, neurologic deficits (focal weakness, numbness, tingling), abnormal gait Psych: Denies anxiety, depression, SI/HI/AVH Skin: Denies new rashes or lesions ID: Denies sick contacts, exotic exposures, travel  Objective: Vitals:   03/26/19 0600 03/26/19 0855 03/26/19 0947 03/26/19 1000  BP: (!) 166/77  (!) 161/95 (!) 171/80  Pulse: (!) 101   97  Resp: 18   20  Temp:  97.7 F (36.5 C)    TempSrc:  Oral    SpO2: 97%   100%  Weight: 59.2 kg     Height:        Intake/Output Summary (Last 24 hours) at 03/26/2019 1057 Last data filed at 03/26/2019 0602 Gross per 24 hour  Intake 2090.04 ml  Output 3500 ml  Net -1409.96 ml   Filed Weights   03/25/19 0223 03/25/19 1510 03/26/19 0600  Weight: 59.8 kg 57.5 kg 59.2 kg    Examination:  General exam: Appears calm and comfortable  Respiratory system: Clear to auscultation. Respiratory  effort normal. Cardiovascular system: Sinus tachycardia, S1 & S2 heard, RRR. No JVD, murmurs, rubs, gallops or clicks. No pedal edema. Gastrointestinal system: Abdomen is nondistended, soft and nontender. No  organomegaly or masses felt. Normal bowel sounds heard. Central nervous system: Alert and oriented. No focal neurological deficits. Extremities: Symmetric 5 x 5 power. Skin: No rashes, lesions or ulcers Psychiatry: Judgement and insight appear normal. Mood & affect appropriate.     Data Reviewed:   CBC: Recent Labs  Lab 03/20/19 1930 03/25/19 0227 03/26/19 0242  WBC 9.5 8.8 8.1  NEUTROABS  --  3.5  --   HGB 12.4* 12.2* 11.6*  HCT 37.5* 36.6* 34.5*  MCV 86.6 87.4 85.6  PLT 100* 91* 96*   Basic Metabolic Panel: Recent Labs  Lab 03/20/19 1930 03/25/19 0227 03/25/19 0941 03/26/19 0242  NA 140 143  --  143  K 3.8 3.7  --  3.4*  CL 105 108  --  114*  CO2 22 21*  --  19*  GLUCOSE 138* 148*  --  144*  BUN 20 21  --  11  CREATININE 1.50* 1.37*  --  1.06  CALCIUM 9.4 9.2  --  8.9  MG  --   --  1.5*  --    GFR: Estimated Creatinine Clearance: 49.6 mL/min (by C-G formula based on SCr of 1.06 mg/dL). Liver Function Tests: Recent Labs  Lab 03/20/19 1930 03/25/19 0227 03/26/19 0242  AST 21 16 16   ALT 22 17 16   ALKPHOS 95 88 88  BILITOT 1.1 0.9 1.0  PROT 7.4 6.6 6.3*  ALBUMIN 4.8 4.2 4.0   No results for input(s): LIPASE, AMYLASE in the last 168 hours. Recent Labs  Lab 03/25/19 0445  AMMONIA 16   Coagulation Profile: Recent Labs  Lab 03/25/19 0430 03/26/19 0242  INR 1.0 1.0   Cardiac Enzymes: Recent Labs  Lab 03/20/19 1930  TROPONINI <0.03   BNP (last 3 results) No results for input(s): PROBNP in the last 8760 hours. HbA1C: No results for input(s): HGBA1C in the last 72 hours. CBG: Recent Labs  Lab 03/25/19 0301 03/25/19 1123 03/25/19 1616 03/25/19 2243 03/26/19 0826  GLUCAP 147* 241* 134* 172* 132*   Lipid Profile: No results for input(s): CHOL, HDL, LDLCALC, TRIG, CHOLHDL, LDLDIRECT in the last 72 hours. Thyroid Function Tests: Recent Labs    03/25/19 0941  TSH 1.827   Anemia Panel: No results for input(s): VITAMINB12, FOLATE,  FERRITIN, TIBC, IRON, RETICCTPCT in the last 72 hours. Sepsis Labs: Recent Labs  Lab 03/25/19 0227 03/25/19 0445 03/25/19 0941 03/26/19 0242  PROCALCITON  --   --   --  <0.10  LATICACIDVEN 3.4* 2.1* 1.4  --     Recent Results (from the past 240 hour(s))  SARS Coronavirus 2 (Hosp order,Performed in [facility] lab via Abbott ID)     Status: None   Collection Time: 03/20/19  8:04 PM   Specimen: Dry Nasal Swab (Abbott ID Now)  Result Value Ref Range Status   SARS Coronavirus 2 (Abbott ID Now) NEGATIVE NEGATIVE Final    Comment: (NOTE) Interpretive Result Comment(s): COVID 19 Positive SARS CoV 2 target nucleic acids are DETECTED. The SARS CoV 2 RNA is generally detectable in upper and lower respiratory specimens during the acute phase of infection.  Positive results are indicative of active infection with SARS CoV 2.  Clinical correlation with patient history and other diagnostic information is necessary to determine patient infection status.  Positive results do not rule  out bacterial infection or coinfection with other viruses. The expected result is Negative. COVID 19 Negative SARS CoV 2 target nucleic acids are NOT DETECTED. The SARS CoV 2 RNA is generally detectable in upper and lower respiratory specimens during the acute phase of infection.  Negative results do not preclude SARS CoV 2 infection, do not rule out coinfections with other pathogens, and should not be used as the sole basis for treatment or other patient management decisions.  Negative results must be combined with clinical  observations, patient history, and epidemiological information. The expected result is Negative. Invalid Presence or absence of SARS CoV 2 nucleic acids cannot be determined. Repeat testing was performed on the submitted specimen and repeated Invalid results were obtained.  If clinically indicated, additional testing on a new specimen with an alternate test methodology [phone]) is  advised.  The SARS CoV 2 RNA is generally detectable in upper and lower respiratory specimens during the acute phase of infection. The expected result is Negative. Fact Sheet for Patients:  GolfingFamily.no Fact Sheet for Healthcare Providers: https://www.hernandez-brewer.com/ This test is not yet approved or cleared by the [place] FDA and has been authorized for detection and/or diagnosis of SARS CoV 2 by FDA under an Emergency Use Authorization (EUA).  This EUA will remain in effect (meaning this test can be used) for the duration of the COVID19 d eclaration under Section 564(b)(1) of the Act, 21 U.S.C. section [phone] 3(b)(1), unless the authorization is terminated or revoked sooner. Performed at [facility], [address] [place], [place] [address]   Culture, blood (Routine x 2)     Status: None (Preliminary result)   Collection Time: 03/25/19  2:27 AM   Specimen: BLOOD  Result Value Ref Range Status   Specimen Description   Final    BLOOD RIGHT ANTECUBITAL Performed at [facility] [address] [place], [place] [address]    Special Requests   Final    BOTTLES DRAWN AEROBIC AND ANAEROBIC Blood Culture adequate volume Performed at [facility] [address], [place], [place] [address]    Culture   Final    NO GROWTH 1 DAY Performed at [facility], [place] [address] [place], [place] [address]    Report Status PENDING  Incomplete  Culture, blood (Routine x 2)     Status: None (Preliminary result)   Collection Time: 03/25/19  2:32 AM   Specimen: BLOOD  Result Value Ref Range Status   Specimen Description   Final    BLOOD LEFT ANTECUBITAL Performed at [facility] [address], [place], [place] [address]    Special Requests   Final    BOTTLES DRAWN AEROBIC AND ANAEROBIC Blood Culture adequate volume Performed at [facility]  [address], [place], [place] [address]    Culture   Final    NO GROWTH 1 DAY Performed at [facility], [place] [address], [place], [place] [address]    Report Status PENDING  Incomplete  SARS Coronavirus 2 (CEPHEID - Performed in [facility] [place] hospital lab), Hosp Order     Status: None   Collection Time: 03/25/19  2:43 AM   Specimen: Nasopharyngeal Swab  Result Value Ref Range Status   SARS Coronavirus 2 NEGATIVE NEGATIVE Final    Comment: (NOTE) If result is NEGATIVE SARS-CoV-2 target nucleic acids are NOT DETECTED. The SARS-CoV-2 RNA is generally detectable in upper and lower  respiratory specimens  during the acute phase of infection. The lowest  concentration of SARS-CoV-2 viral copies this assay can detect is 250  copies / mL. A negative result does not preclude SARS-CoV-2 infection  and should not be used as the sole basis for treatment or other  patient management decisions.  A negative result may occur with  improper specimen collection / handling, submission of specimen other  than nasopharyngeal swab, presence of viral mutation(s) within the  areas targeted by this assay, and inadequate number of viral copies  (<250 copies / mL). A negative result must be combined with clinical  observations, patient history, and epidemiological information. If result is POSITIVE SARS-CoV-2 target nucleic acids are DETECTED. The SARS-CoV-2 RNA is generally detectable in upper and lower  respiratory specimens dur ing the acute phase of infection.  Positive  results are indicative of active infection with SARS-CoV-2.  Clinical  correlation with patient history and other diagnostic information is  necessary to determine patient infection status.  Positive results do  not rule out bacterial infection or co-infection with other viruses. If result is PRESUMPTIVE POSTIVE SARS-CoV-2 nucleic acids MAY BE PRESENT.   A presumptive positive result was obtained on the submitted specimen  and  confirmed on repeat testing.  While 2019 novel coronavirus  (SARS-CoV-2) nucleic acids may be present in the submitted sample  additional confirmatory testing may be necessary for epidemiological  and / or clinical management purposes  to differentiate between  SARS-CoV-2 and other Sarbecovirus currently known to infect humans.  If clinically indicated additional testing with an alternate test  methodology [phone]) is advised. The SARS-CoV-2 RNA is generally  detectable in upper and lower respiratory sp ecimens during the acute  phase of infection. The expected result is Negative. Fact Sheet for Patients:  StrictlyIdeas.no Fact Sheet for Healthcare Providers: BankingDealers.co.za This test is not yet approved or cleared by the [place] FDA and has been authorized for detection and/or diagnosis of SARS-CoV-2 by FDA under an Emergency Use Authorization (EUA).  This EUA will remain in effect (meaning this test can be used) for the duration of the COVID-19 declaration under Section 564(b)(1) of the Act, 21 U.S.C. section 360bbb-3(b)(1), unless the authorization is terminated or revoked sooner. Performed at [facility], [place] [address] [place], [place] [address]   Blood Culture (routine x 2)     Status: None (Preliminary result)   Collection Time: 03/25/19  4:46 AM   Specimen: BLOOD  Result Value Ref Range Status   Specimen Description   Final    BLOOD RIGHT WRIST Performed at [facility] [address] [place], [place] [address]    Special Requests   Final    BOTTLES DRAWN AEROBIC AND ANAEROBIC Blood Culture results may not be optimal due to an excessive volume of blood received in culture bottles Performed at [facility] [address] [place], [place] [address]    Culture   Final    NO GROWTH 1 DAY Performed at [facility], [place] [address], [place], [place]  [address]    Report Status PENDING  Incomplete  Blood Culture (routine x 2)     Status: None (Preliminary result)   Collection Time: 03/25/19  4:51 AM   Specimen: BLOOD  Result Value Ref Range Status   Specimen Description   Final    BLOOD LEFT ANTECUBITAL Performed at [facility] [address] [place], [place] [address]    Special Requests  Final    BOTTLES DRAWN AEROBIC AND ANAEROBIC Blood Culture adequate volume Performed at [facility] [address], [place], [place] [address]    Culture  Setup Time   Final    GRAM POSITIVE COCCI AEROBIC BOTTLE ONLY CRITICAL RESULT CALLED TO, READ BACK BY AND VERIFIED WITH: PHARMD B GREEN [id number] AT 638 AM BY CM    Culture   Final    NO GROWTH 1 DAY Performed at [facility], [place] [address], [place], [place] [address]    Report Status PENDING  Incomplete  Blood Culture ID Panel (Reflexed)     Status: Abnormal   Collection Time: 03/25/19  4:51 AM  Result Value Ref Range Status   Enterococcus species NOT DETECTED NOT DETECTED Final   Listeria monocytogenes NOT DETECTED NOT DETECTED Final   Staphylococcus species DETECTED (A) NOT DETECTED Final    Comment: Methicillin (oxacillin) susceptible coagulase negative staphylococcus. Possible blood culture contaminant (unless isolated from more than one blood culture draw or clinical case suggests pathogenicity). No antibiotic treatment is indicated for blood  culture contaminants. CRITICAL RESULT CALLED TO, READ BACK BY AND VERIFIED WITH: PHARMD B GREEN [id number] AT 379 AM BY CM    Staphylococcus aureus (BCID) NOT DETECTED NOT DETECTED Final   Methicillin resistance NOT DETECTED NOT DETECTED Final   Streptococcus species NOT DETECTED NOT DETECTED Final   Streptococcus agalactiae NOT DETECTED NOT DETECTED Final   Streptococcus pneumoniae NOT DETECTED NOT DETECTED Final   Streptococcus pyogenes NOT DETECTED NOT DETECTED Final   Acinetobacter baumannii NOT  DETECTED NOT DETECTED Final   Enterobacteriaceae species NOT DETECTED NOT DETECTED Final   Enterobacter cloacae complex NOT DETECTED NOT DETECTED Final   Escherichia coli NOT DETECTED NOT DETECTED Final   Klebsiella oxytoca NOT DETECTED NOT DETECTED Final   Klebsiella pneumoniae NOT DETECTED NOT DETECTED Final   Proteus species NOT DETECTED NOT DETECTED Final   Serratia marcescens NOT DETECTED NOT DETECTED Final   Haemophilus influenzae NOT DETECTED NOT DETECTED Final   Neisseria meningitidis NOT DETECTED NOT DETECTED Final   Pseudomonas aeruginosa NOT DETECTED NOT DETECTED Final   Candida albicans NOT DETECTED NOT DETECTED Final   Candida glabrata NOT DETECTED NOT DETECTED Final   Candida krusei NOT DETECTED NOT DETECTED Final   Candida parapsilosis NOT DETECTED NOT DETECTED Final   Candida tropicalis NOT DETECTED NOT DETECTED Final    Comment: Performed at [facility], [address], [place], [place] [address]  MRSA PCR Screening     Status: None   Collection Time: 03/25/19  3:31 PM   Specimen: Nasal Mucosa; Nasopharyngeal  Result Value Ref Range Status   MRSA by PCR NEGATIVE NEGATIVE Final    Comment:        The GeneXpert MRSA Assay (FDA approved for NASAL specimens only), is one component of a comprehensive MRSA colonization surveillance program. It is not intended to diagnose MRSA infection nor to guide or monitor treatment for MRSA infections. Performed at [facility], [place] [address], [place], [place] [address]          Radiology Studies: Ct Abdomen Pelvis W Wo Contrast  Result Date: 03/25/2019 CLINICAL DATA:  Colon cancer. Previous surgery and chemotherapy. Chronic lymphocytic leukemia. EXAM: CT CHEST WITH CONTRAST CT ABDOMEN WITHOUT AND WITH CONTRAST TECHNIQUE: Multidetector CT imaging of the abdomen was performed without intravenous contrast. Multidetector CT imaging of the chest and abdomen was then performed during bolus administration  of intravenous  contrast. CONTRAST:  122mL OMNIPAQUE IOHEXOL 300 MG/ML  SOLN COMPARISON:  AP CT on 02/03/2018, and chest CT 10/24/2012 FINDINGS: CT CHEST FINDINGS Cardiovascular: No acute findings. Aortic atherosclerosis. Mediastinum/Lymph Nodes: Mild bilateral subpectoral and axillary lymphadenopathy is seen. Index lymph node left axilla measures 1.5 cm on image 21/3. No mediastinal or hilar lymphadenopathy identified. Lungs/Pleura: No pulmonary infiltrate or mass identified. No effusion present. Musculoskeletal:  No suspicious bone lesions identified. CT ABDOMEN AND PELVIS FINDINGS Hepatobiliary: No masses identified. Multiple small hepatic cysts remain stable. Gallbladder is unremarkable. Pancreas:  No mass or inflammatory changes. Spleen:  Within normal limits in size and appearance. Adrenals/Urinary tract: Several small bilateral renal cysts again noted. No masses or hydronephrosis. Stomach/Bowel: Surgical staples again seen in the sigmoid colon. No evidence of obstruction, inflammatory process, or abnormal fluid collections. Normal appendix visualized. Vascular/Lymphatic: New mild lymphadenopathy is seen in the left paraaortic region, bilateral iliac chains, and central small bowel mesentery. Largest index lymph node in the left external iliac region measures 1.7 cm and in the right external iliac region measures 1.3 cm. Aortic atherosclerosis. No abdominal aortic aneurysm. Reproductive: Stable mildly enlarged prostate gland with central calcification. Other:  None. Musculoskeletal:  No suspicious bone lesions identified. IMPRESSION: New mild lymphadenopathy in the chest, abdomen, and pelvis, most consistent with chronic lymphocytic leukemia. No definite evidence of recurrent or metastatic colon carcinoma. Electronically Signed   By: Marlaine Hind M.D.   On: 03/25/2019 21:18   Ct Head Wo Contrast  Result Date: 03/25/2019 CLINICAL DATA:  Encephalopathy EXAM: CT HEAD WITHOUT CONTRAST TECHNIQUE: Contiguous  axial images were obtained from the base of the skull through the vertex without intravenous contrast. COMPARISON:  Head CT 09/17/2013 FINDINGS: Brain: There is no mass, hemorrhage or extra-axial collection. The size and configuration of the ventricles and extra-axial CSF spaces are normal. There is hypoattenuation of the white matter, most commonly indicating chronic small vessel disease. Vascular: No abnormal hyperdensity of the major intracranial arteries or dural venous sinuses. No intracranial atherosclerosis. Skull: The visualized skull base, calvarium and extracranial soft tissues are normal. Sinuses/Orbits: No fluid levels or advanced mucosal thickening of the visualized paranasal sinuses. No mastoid or middle ear effusion. The orbits are normal. IMPRESSION: Chronic small vessel disease without acute intracranial abnormality. Electronically Signed   By: Ulyses Jarred M.D.   On: 03/25/2019 03:31   Ct Chest W Contrast  Result Date: 03/25/2019 CLINICAL DATA:  Colon cancer. Previous surgery and chemotherapy. Chronic lymphocytic leukemia. EXAM: CT CHEST WITH CONTRAST CT ABDOMEN WITHOUT AND WITH CONTRAST TECHNIQUE: Multidetector CT imaging of the abdomen was performed without intravenous contrast. Multidetector CT imaging of the chest and abdomen was then performed during bolus administration of intravenous contrast. CONTRAST:  149mL OMNIPAQUE IOHEXOL 300 MG/ML  SOLN COMPARISON:  AP CT on 02/03/2018, and chest CT 10/24/2012 FINDINGS: CT CHEST FINDINGS Cardiovascular: No acute findings. Aortic atherosclerosis. Mediastinum/Lymph Nodes: Mild bilateral subpectoral and axillary lymphadenopathy is seen. Index lymph node left axilla measures 1.5 cm on image 21/3. No mediastinal or hilar lymphadenopathy identified. Lungs/Pleura: No pulmonary infiltrate or mass identified. No effusion present. Musculoskeletal:  No suspicious bone lesions identified. CT ABDOMEN AND PELVIS FINDINGS Hepatobiliary: No masses identified.  Multiple small hepatic cysts remain stable. Gallbladder is unremarkable. Pancreas:  No mass or inflammatory changes. Spleen:  Within normal limits in size and appearance. Adrenals/Urinary tract: Several small bilateral renal cysts again noted. No masses or hydronephrosis. Stomach/Bowel: Surgical staples again seen in the sigmoid colon. No evidence of obstruction, inflammatory  process, or abnormal fluid collections. Normal appendix visualized. Vascular/Lymphatic: New mild lymphadenopathy is seen in the left paraaortic region, bilateral iliac chains, and central small bowel mesentery. Largest index lymph node in the left external iliac region measures 1.7 cm and in the right external iliac region measures 1.3 cm. Aortic atherosclerosis. No abdominal aortic aneurysm. Reproductive: Stable mildly enlarged prostate gland with central calcification. Other:  None. Musculoskeletal:  No suspicious bone lesions identified. IMPRESSION: New mild lymphadenopathy in the chest, abdomen, and pelvis, most consistent with chronic lymphocytic leukemia. No definite evidence of recurrent or metastatic colon carcinoma. Electronically Signed   By: Marlaine Hind M.D.   On: 03/25/2019 21:18   Dg Chest Portable 1 View  Result Date: 03/25/2019 CLINICAL DATA:  78 year old male with altered mental status. EXAM: PORTABLE CHEST 1 VIEW COMPARISON:  Chest radiograph dated 03/20/2019 FINDINGS: The lungs are clear. There is no pleural effusion or pneumothorax. The cardiac silhouette is within normal limits. Atherosclerotic calcification of the aortic arch. No acute osseous pathology. IMPRESSION: No active disease. Electronically Signed   By: Anner Crete M.D.   On: 03/25/2019 03:22        Scheduled Meds: . aspirin  81 mg Oral q morning - 10a  . atorvastatin  40 mg Oral QPM  . Chlorhexidine Gluconate Cloth  6 each Topical Daily  . enoxaparin (LOVENOX) injection  40 mg Subcutaneous Q24H  . insulin aspart  0-5 Units Subcutaneous QHS   . insulin aspart  0-9 Units Subcutaneous TID WC  . sodium chloride flush  3 mL Intravenous Q12H   Continuous Infusions: . dextrose 5 % and 0.45% NaCl 75 mL/hr at 03/26/19 0848     LOS: 1 day   Time spent=  mins    Dannia Snook Arsenio Loader, MD [facility]  If 7PM-7AM, please contact night-coverage [email or web address] 03/26/2019, 10:57 AM

## 2019-03-26 NOTE — Progress Notes (Signed)
PHARMACY - PHYSICIAN COMMUNICATION CRITICAL VALUE ALERT - BLOOD CULTURE IDENTIFICATION (BCID)  [name] is an 78 y.o. male who presented to [facility] on 03/25/2019 with a chief complaint of sepsis  Assessment:  ? (include suspected source if known)  Name of physician (or Provider) Contacted: C Bodenheimer,NP WBCs WNL, afebrile 1 of 4 bottle staph species- likely contaminant  Current antibiotics: Vanc/Cefe  Changes to prescribed antibiotics recommended:  Patient is on recommended antibiotics - No changes needed  Consider narrowing/dc abx when clinically appropriate  Results for orders placed or performed during the hospital encounter of 03/25/19  Blood Culture ID Panel (Reflexed) (Collected: 03/25/2019  4:51 AM)  Result Value Ref Range   Enterococcus species NOT DETECTED NOT DETECTED   Listeria monocytogenes NOT DETECTED NOT DETECTED   Staphylococcus species DETECTED (A) NOT DETECTED   Staphylococcus aureus (BCID) NOT DETECTED NOT DETECTED   Methicillin resistance NOT DETECTED NOT DETECTED   Streptococcus species NOT DETECTED NOT DETECTED   Streptococcus agalactiae NOT DETECTED NOT DETECTED   Streptococcus pneumoniae NOT DETECTED NOT DETECTED   Streptococcus pyogenes NOT DETECTED NOT DETECTED   Acinetobacter baumannii NOT DETECTED NOT DETECTED   Enterobacteriaceae species NOT DETECTED NOT DETECTED   Enterobacter cloacae complex NOT DETECTED NOT DETECTED   Escherichia coli NOT DETECTED NOT DETECTED   Klebsiella oxytoca NOT DETECTED NOT DETECTED   Klebsiella pneumoniae NOT DETECTED NOT DETECTED   Proteus species NOT DETECTED NOT DETECTED   Serratia marcescens NOT DETECTED NOT DETECTED   Haemophilus influenzae NOT DETECTED NOT DETECTED   Neisseria meningitidis NOT DETECTED NOT DETECTED   Pseudomonas aeruginosa NOT DETECTED NOT DETECTED   Candida albicans NOT DETECTED NOT DETECTED   Candida glabrata NOT DETECTED NOT DETECTED   Candida krusei NOT DETECTED NOT DETECTED   Candida parapsilosis NOT DETECTED NOT DETECTED   Candida tropicalis NOT DETECTED NOT DETECTED    Dorrene German 03/26/2019  6:42 AM

## 2019-03-26 NOTE — Progress Notes (Signed)
Carotid artery duplex has been completed. Preliminary results can be found in CV Proc through chart review.   03/26/19 3:08 PM [name] RVT

## 2019-03-27 ENCOUNTER — Inpatient Hospital Stay (HOSPITAL_COMMUNITY): Payer: Medicare Other

## 2019-03-27 DIAGNOSIS — I361 Nonrheumatic tricuspid (valve) insufficiency: Secondary | ICD-10-CM

## 2019-03-27 LAB — GLUCOSE, CAPILLARY
Glucose-Capillary: 171 mg/dL — ABNORMAL HIGH (ref 70–99)
Glucose-Capillary: 102 mg/dL — ABNORMAL HIGH (ref 70–99)

## 2019-03-27 LAB — BASIC METABOLIC PANEL
Anion gap: 6 (ref 5–15)
BUN: 8 mg/dL (ref 8–23)
CO2: 23 mmol/L (ref 22–32)
Calcium: 9 mg/dL (ref 8.9–10.3)
Chloride: 113 mmol/L — ABNORMAL HIGH (ref 98–111)
Creatinine, Ser: 1.17 mg/dL (ref 0.61–1.24)
GFR calc Af Amer: >60 mL/min (ref >60)
GFR calc non Af Amer: >60 mL/min (ref >60)
Glucose, Bld: 178 mg/dL — ABNORMAL HIGH (ref 70–99)
Potassium: 4.2 mmol/L (ref 3.5–5.1)
Sodium: 142 mmol/L (ref 135–145)

## 2019-03-27 LAB — CBC
HCT: 32.8 % — ABNORMAL LOW (ref 39.0–52.0)
Hemoglobin: 11 g/dL — ABNORMAL LOW (ref 13.0–17.0)
MCH: 29.1 pg (ref 26.0–34.0)
MCHC: 33.5 g/dL (ref 30.0–36.0)
MCV: 86.8 fL (ref 80.0–100.0)
Platelets: 95 10*3/uL — ABNORMAL LOW (ref 150–400)
RBC: 3.78 MIL/uL — ABNORMAL LOW (ref 4.22–5.81)
RDW: 13.9 % (ref 11.5–15.5)
WBC: 6.7 10*3/uL (ref 4.0–10.5)
nRBC: 0 % (ref 0.0–0.2)

## 2019-03-27 LAB — MAGNESIUM: Magnesium: 1.8 mg/dL (ref 1.7–2.4)

## 2019-03-27 LAB — CULTURE, BLOOD (ROUTINE X 2): Special Requests: ADEQUATE

## 2019-03-27 LAB — ECHOCARDIOGRAM COMPLETE
Height: 67 in
Weight: 2102.4 oz

## 2019-03-27 MED ORDER — TRAZODONE HCL 50 MG PO TABS: 50.0000 mg | ORAL_TABLET | Freq: Every evening | ORAL | 1 refills | Status: DC | PRN

## 2019-03-27 MED ORDER — METOPROLOL TARTRATE 25 MG PO TABS: 12.5000 mg | ORAL_TABLET | Freq: Two times a day (BID) | ORAL | 2 refills | Status: DC

## 2019-03-27 MED ORDER — PANTOPRAZOLE SODIUM 40 MG PO TBEC: 40.0000 mg | DELAYED_RELEASE_TABLET | Freq: Every day | ORAL | 0 refills | Status: DC

## 2019-03-27 NOTE — Progress Notes (Signed)
  Echocardiogram 2D Echocardiogram has been performed.  

## 2019-03-27 NOTE — Progress Notes (Signed)
Discharge instructions explained to patient and where to pick up his prescriptions. Patient states understanding. Discharge via wheelchair to wife  Who will take patient home.

## 2019-03-27 NOTE — Discharge Summary (Signed)
Physician Discharge Summary  [name] NFA:[id number] DOB: 1940/11/05 DOA: 03/25/2019  PCP: [name], MD  Admit date: 03/25/2019 Discharge date: 03/27/2019  Admitted From: Home Disposition: Home  Recommendations for Outpatient Follow-up:  1. Follow up with PCP in 1-2 weeks 2. Please obtain BMP/CBC in one week your next doctors visit.  3. Started metoprolol 12.5 mg twice daily 4. Discontinued Ambien, prescribed trazodone as needed for bedtime.  Home Health: Equipment/Devices: Discharge Condition: Stable CODE STATUS:  Diet recommendation:   Brief/Interim Summary: 78 year old with history of CLL diagnosed in August rectal cancer, chronic thrombocytopenia, coronary artery disease, essential hypertension was brought to the hospital for evaluation of unresponsiveness.  So far work-up has been negative, no signs of infection, UDS is negative.  Culture data is negative.  TSH within normal limits, UDS negative.  Carotid Dopplers showed 1-39% stenosis bilaterally otherwise insignificant.  Echocardiogram performed. Due to his sinus tachycardia, he was started on beta-blocker metoprolol 12.5 mg twice daily.  Heart rate improved along with his blood pressure.  Given his symptomatology, he was advised to discontinue Ambien and trazodone was prescribed which he tolerated well. Stable for discharge today. I also spoken with his wife over the phone.  Discharge Diagnoses:  Active Problems:   Chronic lymphocytic leukemia (HCC)   Colon cancer (HCC)   Syncope   SIRS (systemic inflammatory response syndrome) (HCC)   Hypertensive urgency   Thrombocytopenia (HCC)  Unresponsive-syncope, stable -Unclear etiology at this time.  No obvious signs of infection.  UDS is negative.  No evidence of alcohol use.  Possible from Ambien versus complex arrhythmia/valvular disease versus pulmonary embolism - TSH within normal limits, UDS negative, no signs of infection. - Procalcitonin negative, discontinue  antibiotics - Echocardiogram- performed, pending final read.  Will review it prior to him leaving the hospital. - Carotid ultrasound- 1-39% stenosis bilateral - Hold off on CTA chest to rule out PE, just received contrast yesterday also he is not short of breath.  He does have slight tachycardia.  Sinus tachycardia, improved -Patient is doing well on metoprolol.  Continue metoprolol 12.5 mg twice daily.  No evidence of shortness of breath or hypoxia, low suspicion for PE.  Insomnia -We will discontinue Ambien.  Trazodone as needed bedtime  History of prediabetes - Previous hemoglobin A1c was 5.8.  Hyperlipidemia -Continue Lipitor 40 mg  Uncontrolled blood pressure -Metoprolol 25 mg twice daily started  Chronic thrombocytopenia -On baseline of 96  History of rectal cancer and CML - Currently under surveillance.  Follow-up outpatient.  Consultations:  None  Subjective: Feels great, wishes to go home.  Remains asymptomatic.  Discharge Exam: Vitals:   03/27/19 1039 03/27/19 1048  BP: (!) 154/72 (!) 154/72  Pulse: 68 68  Resp: 16   Temp:    SpO2: 100%    Vitals:   03/27/19 0450 03/27/19 0500 03/27/19 1039 03/27/19 1048  BP: 139/86  (!) 154/72 (!) 154/72  Pulse: (!) 55  68 68  Resp: 14  16   Temp: 98.2 F (36.8 C)     TempSrc: Oral     SpO2: 100%  100%   Weight:  59.6 kg    Height:        General: Pt is alert, awake, not in acute distress Cardiovascular: RRR, S1/S2 +, no rubs, no gallops Respiratory: CTA bilaterally, no wheezing, no rhonchi Abdominal: Soft, NT, ND, bowel sounds + Extremities: no edema, no cyanosis  Discharge Instructions  Discharge Instructions    Call MD for:  persistant dizziness  or light-headedness   Complete by: As directed    Call MD for:  persistant nausea and vomiting   Complete by: As directed    Diet - low sodium heart healthy   Complete by: As directed    Increase activity slowly   Complete by: As directed       Allergies as of 03/27/2019      Reactions   Hydrocodone-acetaminophen Other (See Comments)   Loss of weight      Medication List    STOP taking these medications   zolpidem 10 MG tablet Commonly known as: AMBIEN     TAKE these medications   aspirin 81 MG tablet Take 81 mg by mouth every morning.   atorvastatin 40 MG tablet Commonly known as: LIPITOR Take 40 mg by mouth every evening.   metFORMIN 1000 MG tablet Commonly known as: GLUCOPHAGE Take 1,000 mg by mouth 2 (two) times daily with a meal.   metoprolol tartrate 25 MG tablet Commonly known as: LOPRESSOR Take 0.5 tablets (12.5 mg total) by mouth 2 (two) times daily.   multivitamin with minerals Tabs tablet Take 1 tablet by mouth daily.   oseltamivir 30 MG capsule Commonly known as: TAMIFLU Take 1 capsule (30 mg total) by mouth 2 (two) times daily. For 5 days   pantoprazole 40 MG tablet Commonly known as: Protonix Take 1 tablet (40 mg total) by mouth daily.   prochlorperazine 10 MG tablet Commonly known as: COMPAZINE TAKE 1 TABLET (10 MG TOTAL) BY MOUTH EVERY 6 (SIX) HOURS AS NEEDED FOR NAUSEA OR VOMITING.   traZODone 50 MG tablet Commonly known as: DESYREL Take 1 tablet (50 mg total) by mouth at bedtime as needed for up to 60 doses for sleep.      Follow-up Information    [name], MD. Schedule an appointment as soon as possible for a visit in 1 week(s).   Specialty: Family Medicine Contact information: [place] [address] [phone]          Allergies  Allergen Reactions  . Hydrocodone-Acetaminophen Other (See Comments)    Loss of weight    You were cared for by a hospitalist during your hospital stay. If you have any questions about your discharge medications or the care you received while you were in the hospital after you are discharged, you can call the unit and asked to speak with the hospitalist on call if the hospitalist that took care of you is  not available. Once you are discharged, your primary care physician will handle any further medical issues. Please note that no refills for any discharge medications will be authorized once you are discharged, as it is imperative that you return to your primary care physician (or establish a relationship with a primary care physician if you do not have one) for your aftercare needs so that they can reassess your need for medications and monitor your lab values.   Procedures/Studies: Ct Abdomen Pelvis W Wo Contrast  Result Date: 03/25/2019 CLINICAL DATA:  Colon cancer. Previous surgery and chemotherapy. Chronic lymphocytic leukemia. EXAM: CT CHEST WITH CONTRAST CT ABDOMEN WITHOUT AND WITH CONTRAST TECHNIQUE: Multidetector CT imaging of the abdomen was performed without intravenous contrast. Multidetector CT imaging of the chest and abdomen was then performed during bolus administration of intravenous contrast. CONTRAST:  144mL OMNIPAQUE IOHEXOL 300 MG/ML  SOLN COMPARISON:  AP CT on 02/03/2018, and chest CT 10/24/2012 FINDINGS: CT CHEST FINDINGS Cardiovascular: No acute findings. Aortic atherosclerosis.  Mediastinum/Lymph Nodes: Mild bilateral subpectoral and axillary lymphadenopathy is seen. Index lymph node left axilla measures 1.5 cm on image 21/3. No mediastinal or hilar lymphadenopathy identified. Lungs/Pleura: No pulmonary infiltrate or mass identified. No effusion present. Musculoskeletal:  No suspicious bone lesions identified. CT ABDOMEN AND PELVIS FINDINGS Hepatobiliary: No masses identified. Multiple small hepatic cysts remain stable. Gallbladder is unremarkable. Pancreas:  No mass or inflammatory changes. Spleen:  Within normal limits in size and appearance. Adrenals/Urinary tract: Several small bilateral renal cysts again noted. No masses or hydronephrosis. Stomach/Bowel: Surgical staples again seen in the sigmoid colon. No evidence of obstruction, inflammatory process, or abnormal fluid collections.  Normal appendix visualized. Vascular/Lymphatic: New mild lymphadenopathy is seen in the left paraaortic region, bilateral iliac chains, and central small bowel mesentery. Largest index lymph node in the left external iliac region measures 1.7 cm and in the right external iliac region measures 1.3 cm. Aortic atherosclerosis. No abdominal aortic aneurysm. Reproductive: Stable mildly enlarged prostate gland with central calcification. Other:  None. Musculoskeletal:  No suspicious bone lesions identified. IMPRESSION: New mild lymphadenopathy in the chest, abdomen, and pelvis, most consistent with chronic lymphocytic leukemia. No definite evidence of recurrent or metastatic colon carcinoma. Electronically Signed   By: Marlaine Hind M.D.   On: 03/25/2019 21:18   Ct Head Wo Contrast  Result Date: 03/25/2019 CLINICAL DATA:  Encephalopathy EXAM: CT HEAD WITHOUT CONTRAST TECHNIQUE: Contiguous axial images were obtained from the base of the skull through the vertex without intravenous contrast. COMPARISON:  Head CT 09/17/2013 FINDINGS: Brain: There is no mass, hemorrhage or extra-axial collection. The size and configuration of the ventricles and extra-axial CSF spaces are normal. There is hypoattenuation of the white matter, most commonly indicating chronic small vessel disease. Vascular: No abnormal hyperdensity of the major intracranial arteries or dural venous sinuses. No intracranial atherosclerosis. Skull: The visualized skull base, calvarium and extracranial soft tissues are normal. Sinuses/Orbits: No fluid levels or advanced mucosal thickening of the visualized paranasal sinuses. No mastoid or middle ear effusion. The orbits are normal. IMPRESSION: Chronic small vessel disease without acute intracranial abnormality. Electronically Signed   By: Ulyses Jarred M.D.   On: 03/25/2019 03:31   Ct Chest W Contrast  Result Date: 03/25/2019 CLINICAL DATA:  Colon cancer. Previous surgery and chemotherapy. Chronic  lymphocytic leukemia. EXAM: CT CHEST WITH CONTRAST CT ABDOMEN WITHOUT AND WITH CONTRAST TECHNIQUE: Multidetector CT imaging of the abdomen was performed without intravenous contrast. Multidetector CT imaging of the chest and abdomen was then performed during bolus administration of intravenous contrast. CONTRAST:  157mL OMNIPAQUE IOHEXOL 300 MG/ML  SOLN COMPARISON:  AP CT on 02/03/2018, and chest CT 10/24/2012 FINDINGS: CT CHEST FINDINGS Cardiovascular: No acute findings. Aortic atherosclerosis. Mediastinum/Lymph Nodes: Mild bilateral subpectoral and axillary lymphadenopathy is seen. Index lymph node left axilla measures 1.5 cm on image 21/3. No mediastinal or hilar lymphadenopathy identified. Lungs/Pleura: No pulmonary infiltrate or mass identified. No effusion present. Musculoskeletal:  No suspicious bone lesions identified. CT ABDOMEN AND PELVIS FINDINGS Hepatobiliary: No masses identified. Multiple small hepatic cysts remain stable. Gallbladder is unremarkable. Pancreas:  No mass or inflammatory changes. Spleen:  Within normal limits in size and appearance. Adrenals/Urinary tract: Several small bilateral renal cysts again noted. No masses or hydronephrosis. Stomach/Bowel: Surgical staples again seen in the sigmoid colon. No evidence of obstruction, inflammatory process, or abnormal fluid collections. Normal appendix visualized. Vascular/Lymphatic: New mild lymphadenopathy is seen in the left paraaortic region, bilateral iliac chains, and central small bowel mesentery. Largest index lymph  node in the left external iliac region measures 1.7 cm and in the right external iliac region measures 1.3 cm. Aortic atherosclerosis. No abdominal aortic aneurysm. Reproductive: Stable mildly enlarged prostate gland with central calcification. Other:  None. Musculoskeletal:  No suspicious bone lesions identified. IMPRESSION: New mild lymphadenopathy in the chest, abdomen, and pelvis, most consistent with chronic lymphocytic  leukemia. No definite evidence of recurrent or metastatic colon carcinoma. Electronically Signed   By: Marlaine Hind M.D.   On: 03/25/2019 21:18   Dg Chest Portable 1 View  Result Date: 03/25/2019 CLINICAL DATA:  78 year old male with altered mental status. EXAM: PORTABLE CHEST 1 VIEW COMPARISON:  Chest radiograph dated 03/20/2019 FINDINGS: The lungs are clear. There is no pleural effusion or pneumothorax. The cardiac silhouette is within normal limits. Atherosclerotic calcification of the aortic arch. No acute osseous pathology. IMPRESSION: No active disease. Electronically Signed   By: Anner Crete M.D.   On: 03/25/2019 03:22   Dg Chest Port 1 View  Result Date: 03/20/2019 CLINICAL DATA:  Fatigue, cough, weakness EXAM: PORTABLE CHEST 1 VIEW COMPARISON:  09/17/2013 FINDINGS: The heart size and mediastinal contours are within normal limits. Both lungs are clear. The visualized skeletal structures are unremarkable. Trachea is midline. Aorta atherosclerotic. No acute osseous finding. IMPRESSION: No active disease. Electronically Signed   By: Jerilynn Mages.  Shick M.D.   On: 03/20/2019 20:30   Vas US Carotid  Result Date: 03/27/2019 Carotid Arterial Duplex Study Indications:       Syncope. Risk Factors:      Hypertension. Comparison Study:  No prior studies. Performing Technologist: Oliver Hum RVT  Examination Guidelines: A complete evaluation includes B-mode imaging, spectral Doppler, color Doppler, and power Doppler as needed of all accessible portions of each vessel. Bilateral testing is considered an integral part of a complete examination. Limited examinations for reoccurring indications may be performed as noted.  Right Carotid Findings: +----------+--------+--------+--------+-----------------------+--------+           PSV cm/sEDV cm/sStenosisDescribe               Comments +----------+--------+--------+--------+-----------------------+--------+ CCA Prox  53      11              smooth and  heterogenous         +----------+--------+--------+--------+-----------------------+--------+ CCA Distal53      11              smooth and heterogenous         +----------+--------+--------+--------+-----------------------+--------+ ICA Prox  55      16              smooth and heterogenous         +----------+--------+--------+--------+-----------------------+--------+ ICA Distal81      26                                     tortuous +----------+--------+--------+--------+-----------------------+--------+ ECA       58      9                                               +----------+--------+--------+--------+-----------------------+--------+ +----------+--------+-------+--------+-------------------+           PSV cm/sEDV cmsDescribeArm Pressure (mmHG) +----------+--------+-------+--------+-------------------+ [id number]                                         +----------+--------+-------+--------+-------------------+ +---------+--------+--+--------+--+---------+  VertebralPSV cm/s47EDV cm/s15Antegrade +---------+--------+--+--------+--+---------+  Left Carotid Findings: +----------+--------+--------+--------+-----------------------+--------+           PSV cm/sEDV cm/sStenosisDescribe               Comments +----------+--------+--------+--------+-----------------------+--------+ CCA Prox  57      14              smooth and heterogenous         +----------+--------+--------+--------+-----------------------+--------+ CCA Distal59      12              smooth and heterogenous         +----------+--------+--------+--------+-----------------------+--------+ ICA Prox  50      17              smooth and heterogenous         +----------+--------+--------+--------+-----------------------+--------+ ICA Distal52      19                                              +----------+--------+--------+--------+-----------------------+--------+ ECA        58      9                                               +----------+--------+--------+--------+-----------------------+--------+ +----------+--------+--------+--------+-------------------+ SubclavianPSV cm/sEDV cm/sDescribeArm Pressure (mmHG) +----------+--------+--------+--------+-------------------+           79                                          +----------+--------+--------+--------+-------------------+ +---------+--------+--+--------+--+---------+ VertebralPSV cm/s39EDV cm/s12Antegrade +---------+--------+--+--------+--+---------+  Summary: Right Carotid: Velocities in the right ICA are consistent with a 1-39% stenosis. Left Carotid: Velocities in the left ICA are consistent with a 1-39% stenosis. Vertebrals: Bilateral vertebral arteries demonstrate antegrade flow. *See table(s) above for measurements and observations.  Electronically signed by Antony Contras MD on 03/27/2019 at 9:32:29 AM.    Final       The results of significant diagnostics from this hospitalization (including imaging, microbiology, ancillary and laboratory) are listed below for reference.     Microbiology: Recent Results (from the past 240 hour(s))  SARS Coronavirus 2 (Hosp order,Performed in [facility] lab via [employer] ID)     Status: None   Collection Time: 03/20/19  8:04 PM   Specimen: Dry Nasal Swab (A[employer] ID Now)  Result Value Ref Range Status   SARS Coronavirus 2 (A[employer] ID Now) NEGATIVE NEGATIVE Final    Comment: (NOTE) Interpretive Result Comment(s): COVID 19 Positive SARS CoV 2 target nucleic acids are DETECTED. The SARS CoV 2 RNA is generally detectable in upper and lower respiratory specimens during the acute phase of infection.  Positive results are indicative of active infection with SARS CoV 2.  Clinical correlation with patient history and other diagnostic information is necessary to determine patient infection status.  Positive results do not rule out bacterial infection  or coinfection with other viruses. The expected result is Negative. COVID 19 Negative SARS CoV 2 target nucleic acids are NOT DETECTED. The SARS CoV 2 RNA is generally detectable in upper and lower respiratory specimens during the acute phase of infection.  Negative results do not preclude SARS CoV 2 infection,  do not rule out coinfections with other pathogens, and should not be used as the sole basis for treatment or other patient management decisions.  Negative results must be combined with clinical  observations, patient history, and epidemiological information. The expected result is Negative. Invalid Presence or absence of SARS CoV 2 nucleic acids cannot be determined. Repeat testing was performed on the submitted specimen and repeated Invalid results were obtained.  If clinically indicated, additional testing on a new specimen with an alternate test methodology [phone]) is advised.  The SARS CoV 2 RNA is generally detectable in upper and lower respiratory specimens during the acute phase of infection. The expected result is Negative. Fact Sheet for Patients:  GolfingFamily.no Fact Sheet for Healthcare Providers: https://www.hernandez-brewer.com/ This test is not yet approved or cleared by the [place] FDA and has been authorized for detection and/or diagnosis of SARS CoV 2 by FDA under an Emergency Use Authorization (EUA).  This EUA will remain in effect (meaning this test can be used) for the duration of the COVID19 d eclaration under Section 564(b)(1) of the Act, 21 U.S.C. section [phone] 3(b)(1), unless the authorization is terminated or revoked sooner. Performed at [facility], [address] [place], [place] [address]   Culture, blood (Routine x 2)     Status: None (Preliminary result)   Collection Time: 03/25/19  2:27 AM   Specimen: BLOOD  Result Value Ref Range Status   Specimen Description   Final    BLOOD  RIGHT ANTECUBITAL Performed at [facility] [address] [place], [place] [address]    Special Requests   Final    BOTTLES DRAWN AEROBIC AND ANAEROBIC Blood Culture adequate volume Performed at [facility] [address] [place], [place] [address]    Culture   Final    NO GROWTH 1 DAY Performed at [facility], [place] [address] [place], [place] [address]    Report Status PENDING  Incomplete  Culture, blood (Routine x 2)     Status: None (Preliminary result)   Collection Time: 03/25/19  2:32 AM   Specimen: BLOOD  Result Value Ref Range Status   Specimen Description   Final    BLOOD LEFT ANTECUBITAL Performed at [facility] [address] [place], [place] [address]    Special Requests   Final    BOTTLES DRAWN AEROBIC AND ANAEROBIC Blood Culture adequate volume Performed at [facility] [address], [place], [place] [address]    Culture   Final    NO GROWTH 1 DAY Performed at [facility], [place] [address] [place],  [address]    Report Status PENDING  Incomplete  SARS Coronavirus 2 (CEPHEID - Performed in [facility] hospital lab), Hosp Order     Status: None   Collection Time: 03/25/19  2:43 AM   Specimen: Nasopharyngeal Swab  Result Value Ref Range Status   SARS Coronavirus 2 NEGATIVE NEGATIVE Final    Comment: (NOTE) If result is NEGATIVE SARS-CoV-2 target nucleic acids are NOT DETECTED. The SARS-CoV-2 RNA is generally detectable in upper and lower  respiratory specimens during the acute phase of infection. The lowest  concentration of SARS-CoV-2 viral copies this assay can detect is 250  copies / mL. A negative result does not preclude SARS-CoV-2 infection  and should not be used as the sole basis for treatment or other  patient management decisions.  A negative result may occur  with  improper specimen collection / handling, submission of specimen other  than  nasopharyngeal swab, presence of viral mutation(s) within the  areas targeted by this assay, and inadequate number of viral copies  (<250 copies / mL). A negative result must be combined with clinical  observations, patient history, and epidemiological information. If result is POSITIVE SARS-CoV-2 target nucleic acids are DETECTED. The SARS-CoV-2 RNA is generally detectable in upper and lower  respiratory specimens dur ing the acute phase of infection.  Positive  results are indicative of active infection with SARS-CoV-2.  Clinical  correlation with patient history and other diagnostic information is  necessary to determine patient infection status.  Positive results do  not rule out bacterial infection or co-infection with other viruses. If result is PRESUMPTIVE POSTIVE SARS-CoV-2 nucleic acids MAY BE PRESENT.   A presumptive positive result was obtained on the submitted specimen  and confirmed on repeat testing.  While 2019 novel coronavirus  (SARS-CoV-2) nucleic acids may be present in the submitted sample  additional confirmatory testing may be necessary for epidemiological  and / or clinical management purposes  to differentiate between  SARS-CoV-2 and other Sarbecovirus currently known to infect humans.  If clinically indicated additional testing with an alternate test  methodology [phone]) is advised. The SARS-CoV-2 RNA is generally  detectable in upper and lower respiratory sp ecimens during the acute  phase of infection. The expected result is Negative. Fact Sheet for Patients:  StrictlyIdeas.no Fact Sheet for Healthcare Providers: BankingDealers.co.za This test is not yet approved or cleared by the [place] FDA and has been authorized for detection and/or diagnosis of SARS-CoV-2 by FDA under an Emergency Use Authorization (EUA).  This EUA will remain in effect (meaning this test can be used) for the duration of  the COVID-19 declaration under Section 564(b)(1) of the Act, 21 U.S.C. section 360bbb-3(b)(1), unless the authorization is terminated or revoked sooner. Performed at [facility], [place] [address] [place], [place] [address]   Blood Culture (routine x 2)     Status: None (Preliminary result)   Collection Time: 03/25/19  4:46 AM   Specimen: BLOOD  Result Value Ref Range Status   Specimen Description   Final    BLOOD RIGHT WRIST Performed at [facility] [address], [place], [place] [address]    Special Requests   Final    BOTTLES DRAWN AEROBIC AND ANAEROBIC Blood Culture results may not be optimal due to an excessive volume of blood received in culture bottles Performed at [facility] [address] [place], [place] [address]    Culture   Final    NO GROWTH 1 DAY Performed at [facility], [place] [address] [place], [place] [address]    Report Status PENDING  Incomplete  Blood Culture (routine x 2)     Status: None (Preliminary result)   Collection Time: 03/25/19  4:51 AM   Specimen: BLOOD  Result Value Ref Range Status   Specimen Description   Final    BLOOD LEFT ANTECUBITAL Performed at [facility] [address] [place], [place] [address]    Special Requests   Final    BOTTLES DRAWN AEROBIC AND ANAEROBIC Blood Culture adequate volume Performed at [facility] [address], [place], [place] [address]    Culture  Setup Time   Final    GRAM POSITIVE COCCI AEROBIC BOTTLE ONLY CRITICAL RESULT CALLED TO, READ BACK BY AND VERIFIED  WITH: PHARMD B GREEN [id number] AT 638 AM BY CM    Culture   Final    GRAM POSITIVE COCCI IDENTIFICATION TO FOLLOW Performed at [facility], [place] [address], [place], [place] [address]    Report Status PENDING  Incomplete  Blood Culture ID Panel (Reflexed)     Status: Abnormal   Collection Time: 03/25/19  4:51 AM  Result Value Ref  Range Status   Enterococcus species NOT DETECTED NOT DETECTED Final   Listeria monocytogenes NOT DETECTED NOT DETECTED Final   Staphylococcus species DETECTED (A) NOT DETECTED Final    Comment: Methicillin (oxacillin) susceptible coagulase negative staphylococcus. Possible blood culture contaminant (unless isolated from more than one blood culture draw or clinical case suggests pathogenicity). No antibiotic treatment is indicated for blood  culture contaminants. CRITICAL RESULT CALLED TO, READ BACK BY AND VERIFIED WITH: PHARMD B GREEN [id number] AT 683 AM BY CM    Staphylococcus aureus (BCID) NOT DETECTED NOT DETECTED Final   Methicillin resistance NOT DETECTED NOT DETECTED Final   Streptococcus species NOT DETECTED NOT DETECTED Final   Streptococcus agalactiae NOT DETECTED NOT DETECTED Final   Streptococcus pneumoniae NOT DETECTED NOT DETECTED Final   Streptococcus pyogenes NOT DETECTED NOT DETECTED Final   Acinetobacter baumannii NOT DETECTED NOT DETECTED Final   Enterobacteriaceae species NOT DETECTED NOT DETECTED Final   Enterobacter cloacae complex NOT DETECTED NOT DETECTED Final   Escherichia coli NOT DETECTED NOT DETECTED Final   Klebsiella oxytoca NOT DETECTED NOT DETECTED Final   Klebsiella pneumoniae NOT DETECTED NOT DETECTED Final   Proteus species NOT DETECTED NOT DETECTED Final   Serratia marcescens NOT DETECTED NOT DETECTED Final   Haemophilus influenzae NOT DETECTED NOT DETECTED Final   Neisseria meningitidis NOT DETECTED NOT DETECTED Final   Pseudomonas aeruginosa NOT DETECTED NOT DETECTED Final   Candida albicans NOT DETECTED NOT DETECTED Final   Candida glabrata NOT DETECTED NOT DETECTED Final   Candida krusei NOT DETECTED NOT DETECTED Final   Candida parapsilosis NOT DETECTED NOT DETECTED Final   Candida tropicalis NOT DETECTED NOT DETECTED Final    Comment: Performed at [facility], [address], [place], [place] [address]  MRSA PCR Screening     Status: None    Collection Time: 03/25/19  3:31 PM   Specimen: Nasal Mucosa; Nasopharyngeal  Result Value Ref Range Status   MRSA by PCR NEGATIVE NEGATIVE Final    Comment:        The GeneXpert MRSA Assay (FDA approved for NASAL specimens only), is one component of a comprehensive MRSA colonization surveillance program. It is not intended to diagnose MRSA infection nor to guide or monitor treatment for MRSA infections. Performed at [facility], [place] [address], [place], [place] [address]      Labs: BNP (last 3 results) No results for input(s): BNP in the last 8760 hours. Basic Metabolic Panel: Recent Labs  Lab 03/20/19 1930 03/25/19 0227 03/25/19 0941 03/26/19 0242 03/27/19 0427  NA 140 143  --  143 142  K 3.8 3.7  --  3.4* 4.2  CL 105 108  --  114* 113*  CO2 22 21*  --  19* 23  GLUCOSE 138* 148*  --  144* 178*  BUN 20 21  --  11 8  CREATININE 1.50* 1.37*  --  1.06 1.17  CALCIUM 9.4 9.2  --  8.9 9.0  MG  --   --  1.5*  --  1.8  Liver Function Tests: Recent Labs  Lab 03/20/19 1930 03/25/19 0227 03/26/19 0242  AST 21 16 16   ALT 22 17 16   ALKPHOS 95 88 88  BILITOT 1.1 0.9 1.0  PROT 7.4 6.6 6.3*  ALBUMIN 4.8 4.2 4.0   No results for input(s): LIPASE, AMYLASE in the last 168 hours. Recent Labs  Lab 03/25/19 0445  AMMONIA 16   CBC: Recent Labs  Lab 03/20/19 1930 03/25/19 0227 03/26/19 0242 03/27/19 0427  WBC 9.5 8.8 8.1 6.7  NEUTROABS  --  3.5  --   --   HGB 12.4* 12.2* 11.6* 11.0*  HCT 37.5* 36.6* 34.5* 32.8*  MCV 86.6 87.4 85.6 86.8  PLT 100* 91* 96* 95*   Cardiac Enzymes: Recent Labs  Lab 03/20/19 1930  TROPONINI <0.03   BNP: Invalid input(s): POCBNP CBG: Recent Labs  Lab 03/26/19 0826 03/26/19 1240 03/26/19 1706 03/26/19 2206 03/27/19 0803  GLUCAP 132* 259* 113* 167* 171*   D-Dimer No results for input(s): DDIMER in the last 72 hours. Hgb A1c No results for input(s): HGBA1C in the last 72 hours. Lipid Profile No  results for input(s): CHOL, HDL, LDLCALC, TRIG, CHOLHDL, LDLDIRECT in the last 72 hours. Thyroid function studies Recent Labs    03/25/19 0941  TSH 1.827   Anemia work up No results for input(s): VITAMINB12, FOLATE, FERRITIN, TIBC, IRON, RETICCTPCT in the last 72 hours. Urinalysis    Component Value Date/Time   COLORURINE STRAW (A) 03/25/2019 0227   APPEARANCEUR CLEAR 03/25/2019 0227   LABSPEC 1.004 (L) 03/25/2019 0227   PHURINE 6.0 03/25/2019 0227   GLUCOSEU NEGATIVE 03/25/2019 0227   HGBUR NEGATIVE 03/25/2019 0227   BILIRUBINUR NEGATIVE 03/25/2019 0227   KETONESUR NEGATIVE 03/25/2019 0227   PROTEINUR NEGATIVE 03/25/2019 0227   UROBILINOGEN 0.2 09/17/2013 0640   NITRITE NEGATIVE 03/25/2019 0227   LEUKOCYTESUR NEGATIVE 03/25/2019 0227   Sepsis Labs Invalid input(s): PROCALCITONIN,  WBC,  LACTICIDVEN Microbiology Recent Results (from the past 240 hour(s))  SARS Coronavirus 2 (Hosp order,Performed in [facility] lab via [facility] ID)     Status: None   Collection Time: 03/20/19  8:04 PM   Specimen: Dry Nasal Swab ([facility] ID Now)  Result Value Ref Range Status   SARS Coronavirus 2 ([facility] ID Now) NEGATIVE NEGATIVE Final    Comment: (NOTE) Interpretive Result Comment(s): COVID 19 Positive SARS CoV 2 target nucleic acids are DETECTED. The SARS CoV 2 RNA is generally detectable in upper and lower respiratory specimens during the acute phase of infection.  Positive results are indicative of active infection with SARS CoV 2.  Clinical correlation with patient history and other diagnostic information is necessary to determine patient infection status.  Positive results do not rule out bacterial infection or coinfection with other viruses. The expected result is Negative. COVID 19 Negative SARS CoV 2 target nucleic acids are NOT DETECTED. The SARS CoV 2 RNA is generally detectable in upper and lower respiratory specimens during the acute phase of infection.  Negative results do  not preclude SARS CoV 2 infection, do not rule out coinfections with other pathogens, and should not be used as the sole basis for treatment or other patient management decisions.  Negative results must be combined with clinical  observations, patient history, and epidemiological information. The expected result is Negative. Invalid Presence or absence of SARS CoV 2 nucleic acids cannot be determined. Repeat testing was performed on the submitted specimen and repeated Invalid results were obtained.  If clinically indicated, additional testing on  a new specimen with an alternate test methodology [phone]) is advised.  The SARS CoV 2 RNA is generally detectable in upper and lower respiratory specimens during the acute phase of infection. The expected result is Negative. Fact Sheet for Patients:  GolfingFamily.no Fact Sheet for Healthcare Providers: https://www.hernandez-brewer.com/ This test is not yet approved or cleared by the [place] FDA and has been authorized for detection and/or diagnosis of SARS CoV 2 by FDA under an Emergency Use Authorization (EUA).  This EUA will remain in effect (meaning this test can be used) for the duration of the COVID19 d eclaration under Section 564(b)(1) of the Act, 21 U.S.C. section [phone] 3(b)(1), unless the authorization is terminated or revoked sooner. Performed at [facility], [address] [place], [place] [address]   Culture, blood (Routine x 2)     Status: None (Preliminary result)   Collection Time: 03/25/19  2:27 AM   Specimen: BLOOD  Result Value Ref Range Status   Specimen Description   Final    BLOOD RIGHT ANTECUBITAL Performed at [facility] [address] [place], [place] [address]    Special Requests   Final    BOTTLES DRAWN AEROBIC AND ANAEROBIC Blood Culture adequate volume Performed at [facility] [address] [place],  [place] [address]    Culture   Final    NO GROWTH 1 DAY Performed at [facility], [place] [address], [place], [place] [address]    Report Status PENDING  Incomplete  Culture, blood (Routine x 2)     Status: None (Preliminary result)   Collection Time: 03/25/19  2:32 AM   Specimen: BLOOD  Result Value Ref Range Status   Specimen Description   Final    BLOOD LEFT ANTECUBITAL Performed at [facility] [address], [place], [place] [address]    Special Requests   Final    BOTTLES DRAWN AEROBIC AND ANAEROBIC Blood Culture adequate volume Performed at [facility] [address], [place], [place] [address]    Culture   Final    NO GROWTH 1 DAY Performed at [facility], [place] [address], [place], [place] [address]    Report Status PENDING  Incomplete  SARS Coronavirus 2 (CEPHEID - Performed in [facility]), Hosp Order     Status: None   Collection Time: 03/25/19  2:43 AM   Specimen: Nasopharyngeal Swab  Result Value Ref Range Status   SARS Coronavirus 2 NEGATIVE NEGATIVE Final    Comment: (NOTE) If result is NEGATIVE SARS-CoV-2 target nucleic acids are NOT DETECTED. The SARS-CoV-2 RNA is generally detectable in upper and lower  respiratory specimens during the acute phase of infection. The lowest  concentration of SARS-CoV-2 viral copies this assay can detect is 250  copies / mL. A negative result does not preclude SARS-CoV-2 infection  and should not be used as the sole basis for treatment or other  patient management decisions.  A negative result may occur with  improper specimen collection / handling, submission of specimen other  than nasopharyngeal swab, presence of viral mutation(s) within the  areas targeted by this assay, and inadequate number of viral copies  (<250 copies / mL). A negative result must be combined with clinical  observations, patient history, and epidemiological information. If result is  POSITIVE SARS-CoV-2 target nucleic acids are DETECTED. The SARS-CoV-2 RNA is generally detectable in upper and lower  respiratory specimens dur  ing the acute phase of infection.  Positive  results are indicative of active infection with SARS-CoV-2.  Clinical  correlation with patient history and other diagnostic information is  necessary to determine patient infection status.  Positive results do  not rule out bacterial infection or co-infection with other viruses. If result is PRESUMPTIVE POSTIVE SARS-CoV-2 nucleic acids MAY BE PRESENT.   A presumptive positive result was obtained on the submitted specimen  and confirmed on repeat testing.  While 2019 novel coronavirus  (SARS-CoV-2) nucleic acids may be present in the submitted sample  additional confirmatory testing may be necessary for epidemiological  and / or clinical management purposes  to differentiate between  SARS-CoV-2 and other Sarbecovirus currently known to infect humans.  If clinically indicated additional testing with an alternate test  methodology [phone]) is advised. The SARS-CoV-2 RNA is generally  detectable in upper and lower respiratory sp ecimens during the acute  phase of infection. The expected result is Negative. Fact Sheet for Patients:  StrictlyIdeas.no Fact Sheet for Healthcare Providers: BankingDealers.co.za This test is not yet approved or cleared by the [place] FDA and has been authorized for detection and/or diagnosis of SARS-CoV-2 by FDA under an Emergency Use Authorization (EUA).  This EUA will remain in effect (meaning this test can be used) for the duration of the COVID-19 declaration under Section 564(b)(1) of the Act, 21 U.S.C. section 360bbb-3(b)(1), unless the authorization is terminated or revoked sooner. Performed at [facility], [place] [address], [place], [place] [address]   Blood Culture (routine x 2)     Status:  None (Preliminary result)   Collection Time: 03/25/19  4:46 AM   Specimen: BLOOD  Result Value Ref Range Status   Specimen Description   Final    BLOOD RIGHT WRIST Performed at [facility] [address], [place], [place] [address]    Special Requests   Final    BOTTLES DRAWN AEROBIC AND ANAEROBIC Blood Culture results may not be optimal due to an excessive volume of blood received in culture bottles Performed at [facility] [address], [place], [place] [address]    Culture   Final    NO GROWTH 1 DAY Performed at [facility], [place] [address] [place], [place] [address]    Report Status PENDING  Incomplete  Blood Culture (routine x 2)     Status: None (Preliminary result)   Collection Time: 03/25/19  4:51 AM   Specimen: BLOOD  Result Value Ref Range Status   Specimen Description   Final    BLOOD LEFT ANTECUBITAL Performed at [facility] [address] [place], [place] [address]    Special Requests   Final    BOTTLES DRAWN AEROBIC AND ANAEROBIC Blood Culture adequate volume Performed at [facility] [address] [place], [place] [address]    Culture  Setup Time   Final    GRAM POSITIVE COCCI AEROBIC BOTTLE ONLY CRITICAL RESULT CALLED TO, READ BACK BY AND VERIFIED WITH: PHARMD B GREEN [id number] AT 49 AM BY CM    Culture   Final    GRAM POSITIVE COCCI IDENTIFICATION TO FOLLOW Performed at [facility], [place] [address] [place],  [address]    Report Status PENDING  Incomplete  Blood Culture ID Panel (Reflexed)     Status: Abnormal   Collection Time: 03/25/19  4:51 AM  Result Value Ref Range Status   Enterococcus species NOT  DETECTED NOT DETECTED Final   Listeria monocytogenes NOT DETECTED NOT DETECTED Final   Staphylococcus species DETECTED (A) NOT DETECTED Final    Comment: Methicillin (oxacillin) susceptible coagulase negative staphylococcus. Possible blood culture  contaminant (unless isolated from more than one blood culture draw or clinical case suggests pathogenicity). No antibiotic treatment is indicated for blood  culture contaminants. CRITICAL RESULT CALLED TO, READ BACK BY AND VERIFIED WITH: PHARMD B GREEN [id number] AT 481 AM BY CM    Staphylococcus aureus (BCID) NOT DETECTED NOT DETECTED Final   Methicillin resistance NOT DETECTED NOT DETECTED Final   Streptococcus species NOT DETECTED NOT DETECTED Final   Streptococcus agalactiae NOT DETECTED NOT DETECTED Final   Streptococcus pneumoniae NOT DETECTED NOT DETECTED Final   Streptococcus pyogenes NOT DETECTED NOT DETECTED Final   Acinetobacter baumannii NOT DETECTED NOT DETECTED Final   Enterobacteriaceae species NOT DETECTED NOT DETECTED Final   Enterobacter cloacae complex NOT DETECTED NOT DETECTED Final   Escherichia coli NOT DETECTED NOT DETECTED Final   Klebsiella oxytoca NOT DETECTED NOT DETECTED Final   Klebsiella pneumoniae NOT DETECTED NOT DETECTED Final   Proteus species NOT DETECTED NOT DETECTED Final   Serratia marcescens NOT DETECTED NOT DETECTED Final   Haemophilus influenzae NOT DETECTED NOT DETECTED Final   Neisseria meningitidis NOT DETECTED NOT DETECTED Final   Pseudomonas aeruginosa NOT DETECTED NOT DETECTED Final   Candida albicans NOT DETECTED NOT DETECTED Final   Candida glabrata NOT DETECTED NOT DETECTED Final   Candida krusei NOT DETECTED NOT DETECTED Final   Candida parapsilosis NOT DETECTED NOT DETECTED Final   Candida tropicalis NOT DETECTED NOT DETECTED Final    Comment: Performed at [facility], [address] [place], [place] [address]  MRSA PCR Screening     Status: None   Collection Time: 03/25/19  3:31 PM   Specimen: Nasal Mucosa; Nasopharyngeal  Result Value Ref Range Status   MRSA by PCR NEGATIVE NEGATIVE Final    Comment:        The GeneXpert MRSA Assay (FDA approved for NASAL specimens only), is one component of a comprehensive MRSA  colonization surveillance program. It is not intended to diagnose MRSA infection nor to guide or monitor treatment for MRSA infections. Performed at [facility], [place] [address], [place], [place] [address]      Time coordinating discharge:  I have spent 35 minutes face to face with the patient and on the ward discussing the patients care, assessment, plan and disposition with other care givers. >50% of the time was devoted counseling the patient about the risks and benefits of treatment/Discharge disposition and coordinating care.   SIGNED:   Damita Lack, MD  [facility] 03/27/2019, 11:20 AM   If 7PM-7AM, please contact night-coverage [email or web address]

## 2019-03-29 DIAGNOSIS — G4700 Insomnia, unspecified: Secondary | ICD-10-CM | POA: Diagnosis not present

## 2019-03-29 DIAGNOSIS — R000 Tachycardia, unspecified: Secondary | ICD-10-CM | POA: Diagnosis not present

## 2019-03-29 DIAGNOSIS — I10 Essential (primary) hypertension: Secondary | ICD-10-CM | POA: Diagnosis not present

## 2019-03-30 LAB — CULTURE, BLOOD (ROUTINE X 2)
Culture: NO GROWTH
Culture: NO GROWTH
Culture: NO GROWTH
Special Requests: ADEQUATE
Special Requests: ADEQUATE

## 2019-04-11 ENCOUNTER — Emergency Department (HOSPITAL_COMMUNITY): Payer: Medicare Other

## 2019-04-11 ENCOUNTER — Encounter (HOSPITAL_COMMUNITY): Payer: Self-pay | Admitting: Radiology

## 2019-04-11 ENCOUNTER — Emergency Department (HOSPITAL_COMMUNITY)
Admission: EM | Admit: 2019-04-11 | Discharge: 2019-04-11 | Disposition: A | Discharge status: Home / Self Care | Payer: Medicare Other | Attending: Emergency Medicine | Admitting: Emergency Medicine

## 2019-04-11 DIAGNOSIS — R000 Tachycardia, unspecified: Secondary | ICD-10-CM | POA: Diagnosis not present

## 2019-04-11 DIAGNOSIS — S0990XA Unspecified injury of head, initial encounter: Secondary | ICD-10-CM | POA: Diagnosis not present

## 2019-04-11 DIAGNOSIS — R531 Weakness: Secondary | ICD-10-CM | POA: Insufficient documentation

## 2019-04-11 DIAGNOSIS — Z7984 Long term (current) use of oral hypoglycemic drugs: Secondary | ICD-10-CM | POA: Diagnosis not present

## 2019-04-11 DIAGNOSIS — R42 Dizziness and giddiness: Secondary | ICD-10-CM | POA: Diagnosis not present

## 2019-04-11 DIAGNOSIS — W19XXXA Unspecified fall, initial encounter: Secondary | ICD-10-CM | POA: Diagnosis not present

## 2019-04-11 DIAGNOSIS — Z20828 Contact with and (suspected) exposure to other viral communicable diseases: Secondary | ICD-10-CM | POA: Diagnosis not present

## 2019-04-11 DIAGNOSIS — E119 Type 2 diabetes mellitus without complications: Secondary | ICD-10-CM | POA: Insufficient documentation

## 2019-04-11 DIAGNOSIS — Z7982 Long term (current) use of aspirin: Secondary | ICD-10-CM | POA: Insufficient documentation

## 2019-04-11 DIAGNOSIS — Z79899 Other long term (current) drug therapy: Secondary | ICD-10-CM | POA: Insufficient documentation

## 2019-04-11 DIAGNOSIS — S299XXA Unspecified injury of thorax, initial encounter: Secondary | ICD-10-CM | POA: Diagnosis not present

## 2019-04-11 DIAGNOSIS — M6281 Muscle weakness (generalized): Secondary | ICD-10-CM | POA: Diagnosis not present

## 2019-04-11 DIAGNOSIS — Z85038 Personal history of other malignant neoplasm of large intestine: Secondary | ICD-10-CM | POA: Insufficient documentation

## 2019-04-11 DIAGNOSIS — Z87891 Personal history of nicotine dependence: Secondary | ICD-10-CM | POA: Insufficient documentation

## 2019-04-11 DIAGNOSIS — I10 Essential (primary) hypertension: Secondary | ICD-10-CM | POA: Insufficient documentation

## 2019-04-11 LAB — CBC WITH DIFFERENTIAL/PLATELET
Abs Immature Granulocytes: 0.01 10*3/uL (ref 0.00–0.07)
Basophils Absolute: 0 10*3/uL (ref 0.0–0.1)
Basophils Relative: 0 %
Eosinophils Absolute: 0.1 10*3/uL (ref 0.0–0.5)
Eosinophils Relative: 2 %
HCT: 35.6 % — ABNORMAL LOW (ref 39.0–52.0)
Hemoglobin: 12 g/dL — ABNORMAL LOW (ref 13.0–17.0)
Immature Granulocytes: 0 %
Lymphocytes Relative: 56 %
Lymphs Abs: 4 10*3/uL (ref 0.7–4.0)
MCH: 29.7 pg (ref 26.0–34.0)
MCHC: 33.7 g/dL (ref 30.0–36.0)
MCV: 88.1 fL (ref 80.0–100.0)
Monocytes Absolute: 0.5 10*3/uL (ref 0.1–1.0)
Monocytes Relative: 7 %
Neutro Abs: 2.5 10*3/uL (ref 1.7–7.7)
Neutrophils Relative %: 35 %
Platelets: 78 10*3/uL — ABNORMAL LOW (ref 150–400)
RBC: 4.04 MIL/uL — ABNORMAL LOW (ref 4.22–5.81)
RDW: 14.1 % (ref 11.5–15.5)
WBC: 7.2 10*3/uL (ref 4.0–10.5)
nRBC: 0 % (ref 0.0–0.2)

## 2019-04-11 LAB — COMPREHENSIVE METABOLIC PANEL
ALT: 23 U/L (ref 0–44)
AST: 21 U/L (ref 15–41)
Albumin: 4.4 g/dL (ref 3.5–5.0)
Alkaline Phosphatase: 105 U/L (ref 38–126)
Anion gap: 14 (ref 5–15)
BUN: 16 mg/dL (ref 8–23)
CO2: 24 mmol/L (ref 22–32)
Calcium: 9.3 mg/dL (ref 8.9–10.3)
Chloride: 104 mmol/L (ref 98–111)
Creatinine, Ser: 1.49 mg/dL — ABNORMAL HIGH (ref 0.61–1.24)
GFR calc Af Amer: 52 mL/min — ABNORMAL LOW (ref >60)
GFR calc non Af Amer: 45 mL/min — ABNORMAL LOW (ref >60)
Glucose, Bld: 162 mg/dL — ABNORMAL HIGH (ref 70–99)
Potassium: 3.7 mmol/L (ref 3.5–5.1)
Sodium: 142 mmol/L (ref 135–145)
Total Bilirubin: 1.3 mg/dL — ABNORMAL HIGH (ref 0.3–1.2)
Total Protein: 6.9 g/dL (ref 6.5–8.1)

## 2019-04-11 LAB — URINALYSIS, ROUTINE W REFLEX MICROSCOPIC
Bilirubin Urine: NEGATIVE
Glucose, UA: NEGATIVE mg/dL
Hgb urine dipstick: NEGATIVE
Ketones, ur: NEGATIVE mg/dL
Leukocytes,Ua: NEGATIVE
Nitrite: NEGATIVE
Protein, ur: NEGATIVE mg/dL
Specific Gravity, Urine: 1.008 (ref 1.005–1.030)
pH: 7 (ref 5.0–8.0)

## 2019-04-11 LAB — CBG MONITORING, ED: Glucose-Capillary: 136 mg/dL — ABNORMAL HIGH (ref 70–99)

## 2019-04-11 LAB — LACTIC ACID, PLASMA
Lactic Acid, Venous: 2.7 mmol/L (ref 0.5–1.9)
Lactic Acid, Venous: 1.8 mmol/L (ref 0.5–1.9)

## 2019-04-11 LAB — SARS CORONAVIRUS 2 BY RT PCR (HOSPITAL ORDER, PERFORMED IN ~~LOC~~ HOSPITAL LAB): SARS Coronavirus 2: NEGATIVE

## 2019-04-11 MED ORDER — SODIUM CHLORIDE 0.9 % IV BOLUS
1000.0000 mL | Freq: Once | INTRAVENOUS | Status: AC
  Administered 2019-04-11: 1000 mL via INTRAVENOUS

## 2019-04-11 NOTE — ED Notes (Signed)
Patient transported to X-ray 

## 2019-04-11 NOTE — Discharge Instructions (Signed)
As we discussed, make sure you are staying hydrated drink plenty of fluids.  Follow-up with your primary care doctor.  Return emergency department for any chest pain, difficulty breathing, numbness/weakness of your arms or legs, fevers or any other worsening or concerning symptoms.

## 2019-04-11 NOTE — ED Provider Notes (Signed)
[facility] DEPT Provider Note   CSN: [id number] Arrival date & time: 04/11/19  0911    History   Chief Complaint Chief Complaint  Patient presents with  . Fall  . Weakness    HPI Archit Leger is a 78 y.o. male past medical history of CLL, rectal cancer, diabetes who presents via EMS for evaluation of fall.  Per EMS, patient is coming from home where wife found patient on the floor.  Patient did report to EMS that he was attempting to walk in the bathroom but is not remember how he fell.  Wife states that she last saw him last night and was acting appropriately at that time.  She states that he had recently been admitted to the hospital and was discharged and had been doing better at home up until today's incident.  She states that she found him on the floor this morning at about 845.  When she asked him what happened, he said "I fell," but was unable to tell her how or when.  She did report that people patient was able to answer questions and that he was able to get up from the floor to the bed with her assistance.  He did not have any other complaints at that time.  On any ED arrival, patient reports some generalized weakness.  He denies any focal weakness.  He also reports feeling dizzy but states that does not feel like a room spinning sensation. He describes it more as a lightheadedness sensation.  He denies any recent fevers, vision changes, neck or back pain, chest pain, difficulty breathing, abdominal pain, nausea/vomiting, numbness/weakness of his arms or legs.  Wife denies any recent travel or known COVID-19 exposure.    Wife Taivon Haroon): [phone]    The history is provided by the patient.    Past Medical History:  Diagnosis Date  . CLL (chronic lymphocytic leukemia) (HCC)    chemotherapy  . Colon cancer ([place])   . Colorectal cancer ([place])    COLORECTAL DX 2/09, treated surgically  . Diabetes mellitus   . Hypercholesterolemia   .  Hypertension   . Pneumonia    Bilateral pneumonia    Patient Active Problem List   Diagnosis Date Noted  . SIRS (systemic inflammatory response syndrome) ([place]) 03/25/2019  . Hypertensive urgency 03/25/2019  . Thrombocytopenia ([facility]) 03/25/2019  . Influenza A 09/18/2013  . Syncope 09/17/2013  . Diabetes mellitus ([place]) 09/17/2013  . Hypertension   . Hypercholesterolemia   . Chronic lymphocytic leukemia ([place]) 08/01/2011  . Colon cancer ([place]) 08/01/2011    Past Surgical History:  Procedure Laterality Date  . HEMICOLECTOMY    . nasal polyps     S/P nasal surgery        Home Medications    Prior to Admission medications   Medication Sig Start Date End Date Taking? Authorizing Provider  aspirin 81 MG tablet Take 81 mg by mouth every morning.    Yes [provider]  atorvastatin (LIPITOR) 40 MG tablet Take 40 mg by mouth every evening.  01/10/13  Yes [provider]  calcium carbonate (TUMS - DOSED IN MG ELEMENTAL CALCIUM) 500 MG chewable tablet Chew 1 tablet by mouth daily as needed for indigestion or heartburn.   Yes [provider]  lisinopril (ZESTRIL) 20 MG tablet Take 20 mg by mouth every morning. 12/14/18  Yes [provider]  metFORMIN (GLUCOPHAGE) 1000 MG tablet Take 1,000 mg by mouth 2 (two) times daily with  a meal. 06/05/17  Yes [provider]  metoprolol tartrate (LOPRESSOR) 25 MG tablet Take 0.5 tablets (12.5 mg total) by mouth 2 (two) times daily. 03/27/19 06/25/19 Yes Amin, Jeanella Flattery, MD  Multiple Vitamin (MULTIVITAMIN WITH MINERALS) TABS tablet Take 1 tablet by mouth daily.   Yes [provider]  prochlorperazine (COMPAZINE) 10 MG tablet TAKE 1 TABLET (10 MG TOTAL) BY MOUTH EVERY 6 (SIX) HOURS AS NEEDED FOR NAUSEA OR VOMITING. 03/06/19  Yes Wyatt Portela, MD  traZODone (DESYREL) 50 MG tablet Take 1 tablet (50 mg total) by mouth at bedtime as needed for up to 60 doses for sleep. 03/27/19  Yes Amin, Ankit Chirag, MD   pantoprazole (PROTONIX) 40 MG tablet Take 1 tablet (40 mg total) by mouth daily. Patient not taking: Reported on 04/11/2019 03/27/19 06/25/19  Damita Lack, MD    Family History Family History  Problem Relation Age of Onset  . Ovarian cancer Mother   . Breast cancer Maternal Aunt     Social History Social History   Tobacco Use  . Smoking status: Former Smoker    Packs/day: 0.80    Years: 35.00    Pack years: 28.00    Types: Cigarettes  . Smokeless tobacco: Never Used  Substance Use Topics  . Alcohol use: No  . Drug use: No     Allergies   Hydrocodone-acetaminophen   Review of Systems Review of Systems  Constitutional: Negative for fever.  Respiratory: Negative for cough and shortness of breath.   Cardiovascular: Negative for chest pain.  Gastrointestinal: Negative for abdominal pain, nausea and vomiting.  Genitourinary: Negative for dysuria and hematuria.  Neurological: Positive for weakness (generalized). Negative for light-headedness and headaches.  All other systems reviewed and are negative.    Physical Exam Updated Vital Signs BP (!) 181/100   Pulse (!) 101   Temp 98 F (36.7 C) (Oral)   Resp 20   SpO2 100%   Physical Exam Vitals signs and nursing note reviewed.  Constitutional:      Appearance: Normal appearance. He is well-developed.     Comments: Appears sleepy.  He is sitting there with his eyes closed but is responsive to verbal stimuli and will answer questions appropriately.  HENT:     Head: Normocephalic and atraumatic.     Mouth/Throat:     Mouth: Mucous membranes are moist.  Eyes:     General: Lids are normal.     Conjunctiva/sclera: Conjunctivae normal.     Pupils: Pupils are equal, round, and reactive to light.     Comments: PERRL. EOMs intact without any nystagmus.   Neck:     Musculoskeletal: Full passive range of motion without pain.     Comments: No midline bony tenderness noted.  Cardiovascular:     Rate and Rhythm:  Normal rate and regular rhythm.     Pulses: Normal pulses.          Radial pulses are 2+ on the right side and 2+ on the left side.       Dorsalis pedis pulses are 2+ on the right side and 2+ on the left side.     Heart sounds: Normal heart sounds. No murmur. No friction rub. No gallop.   Pulmonary:     Effort: Pulmonary effort is normal.     Breath sounds: Normal breath sounds.     Comments: Lungs clear to auscultation bilaterally.  Symmetric chest rise.  No wheezing, rales, rhonchi. Abdominal:  Palpations: Abdomen is soft. Abdomen is not rigid.     Tenderness: There is no abdominal tenderness. There is no guarding.     Comments: Abdomen is soft, non-distended, non-tender. No rigidity, No guarding. No peritoneal signs.  Musculoskeletal: Normal range of motion.  Skin:    General: Skin is warm and dry.     Capillary Refill: Capillary refill takes less than 2 seconds.  Neurological:     Mental Status: He is alert and oriented to person, place, and time.     Comments: Cranial nerves III-XII intact Follows commands, Moves all extremities  5/5 strength to BUE and BLE  Sensation intact throughout all major nerve distributions Normal coordination No slurred speech. No facial droop.  Alert and oriented x3.  He is able to tell me who he is, where he is and who the president is but does not recall what year it is.  Psychiatric:        Speech: Speech normal.      ED Treatments / Results  Labs (all labs ordered are listed, but only abnormal results are displayed) Labs Reviewed  COMPREHENSIVE METABOLIC PANEL - Abnormal; Notable for the following components:      Result Value   Glucose, Bld 162 (*)    Creatinine, Ser 1.49 (*)    Total Bilirubin 1.3 (*)    GFR calc non Af Amer 45 (*)    GFR calc Af Amer 52 (*)    All other components within normal limits  CBC WITH DIFFERENTIAL/PLATELET - Abnormal; Notable for the following components:   RBC 4.04 (*)    Hemoglobin 12.0 (*)    HCT  35.6 (*)    Platelets 78 (*)    All other components within normal limits  URINALYSIS, ROUTINE W REFLEX MICROSCOPIC - Abnormal; Notable for the following components:   Color, Urine STRAW (*)    All other components within normal limits  LACTIC ACID, PLASMA - Abnormal; Notable for the following components:   Lactic Acid, Venous 2.7 (*)    All other components within normal limits  CBG MONITORING, ED - Abnormal; Notable for the following components:   Glucose-Capillary 136 (*)    All other components within normal limits  SARS CORONAVIRUS 2 (HOSPITAL ORDER, [place] [facility])  LACTIC ACID, PLASMA    EKG EKG Interpretation  Date/Time:  Tuesday April 11 2019 09:24:37 EDT Ventricular Rate:  106 PR Interval:    QRS Duration: 68 QT Interval:  354 QTC Calculation: 471 R Axis:   -31 Text Interpretation:  Sinus tachycardia Left axis deviation Low voltage, extremity leads similar to prior 6/20 Confirmed by Aletta Edouard [phone] on 04/11/2019 9:26:24 AM Also confirmed by Aletta Edouard [phone]), editor Philomena Doheny [phone])  on 04/11/2019 9:33:15 AM   Radiology Dg Chest 2 View  Result Date: 04/11/2019 CLINICAL DATA:  Fall. EXAM: CHEST - 2 VIEW COMPARISON:  March 25, 2019 FINDINGS: The heart size and mediastinal contours are within normal limits. Both lungs are clear. The visualized skeletal structures are unremarkable. IMPRESSION: No active cardiopulmonary disease. Electronically Signed   By: Dorise Bullion III M.D   On: 04/11/2019 10:47   Ct Head Wo Contrast  Result Date: 04/11/2019 CLINICAL DATA:  Fall.  Weakness and dizziness. EXAM: CT HEAD WITHOUT CONTRAST TECHNIQUE: Contiguous axial images were obtained from the base of the skull through the vertex without intravenous contrast. COMPARISON:  CT head dated March 25, 2019. FINDINGS: Brain: No evidence of acute infarction, hemorrhage, hydrocephalus,  extra-axial collection or mass lesion/mass effect. Stable mild atrophy and  chronic microvascular ischemic changes. Vascular: Atherosclerotic vascular calcification of the carotid siphons. No hyperdense vessel. Skull: Normal. Negative for fracture or focal lesion. Sinuses/Orbits: No acute finding. Other: None. IMPRESSION: 1.  No acute intracranial abnormality. Electronically Signed   By: Titus Dubin M.D.   On: 04/11/2019 10:41    Procedures Procedures (including critical care time)  Medications Ordered in ED Medications  sodium chloride 0.9 % bolus 1,000 mL (0 mLs Intravenous Stopped 04/11/19 1415)     Initial Impression / Assessment and Plan / ED Course  I have reviewed the triage vital signs and the nursing notes.  Pertinent labs & imaging results that were available during my care of the patient were reviewed by me and considered in my medical decision making (see chart for details).  Clinical Course as of Apr 10 1513  Tue Apr 10, 4865  4736 78 year old male here after presumed fall without any complaints.  He says he just feels generally weak.  He said a slightly elevated lactate so giving him some fluids and running some basic tests.  Anticipate discharge back home if no serious findings identified.   [MB]  3810 Chest x-ray and CT reviewed by me,  no obvious findings.   [MB]    Clinical Course User Index [MB] Hayden Rasmussen, MD       78 year old male past mostly of CLL, rectal cancer, diabetes presents for evaluation of fall.  Unknown time.  Patient does not recall how or when he fell.  Wife found him this morning on the floor.  She reports he had been feeling fine prior to today symptoms.  No fevers, chest pain, abdominal pain.  Patient is not currently on blood thinners. Patient is afebrile, non-toxic appearing, sitting comfortably on examination table.  He does appear sleepy but is arousable to verbal stimuli and is able to answer questions appropriately and follows commands.  Vital signs reviewed and stable.  No neuro deficits on exam.  Benign  abdominal exam.  Plan to check basic labs, chest x-ray, CT head given fall.  Plan for IV fluids.  Consider dehydration versus electrolyte imbalance versus infectious etiology.  History/physical exam not concerning for CVA, ACS etiology.   Lactic is slightly elevated 2.7.  Suspect this is likely from dehydration.  CBC shows no leukocytosis.  Hemoglobin stable at 12.0.  He does have thrombocytopenia with platelets of 78.  Review of records show that is consistent with baseline.  CMP shows BUN of 16, creatinine of 1.49.  Review of his previous records show that his baseline is anywhere between 1.3-1.5.  COVID testing is negative.  CT head shows no evidence of acute intracranial abnormality.  Chest x-ray shows no evidence of infectious etiology.  Repeat lactic acid is improved after fluids.  Now 1.8.  I suspect that this was initially elevated secondary to dehydration rather than infectious process.  He has no fever, no leukocytosis.  Additionally, is improved with fluids.  Reevaluation.  Patient is sitting up in bed and is much more alert.  He is able to answer questions appropriately.  He is alert and oriented x4 and is able to tell me who he is, where at, what year it is and who the president is.  He reports feeling better.  I personally ambulated patient in the ED.  No signs of ataxia.  Patient reports feeling good while walking.  Vitals are stable. History/physical exam is not concerning for  CVA. No indication for MRI. Discussed patient with Dr. Melina Copa who is agreeable to plan.   I discussed with patient's wife.  At this time, no indication that patient will need admission.  Encouraged continued hydration methods and primary care follow-up.  Wife is agreeable. Patient had ample opportunity for questions and discussion. All patient's questions were answered with full understanding. Strict return precautions discussed. Patient expresses understanding and agreement to plan.   Portions of this note were  generated with [employer]. Dictation errors may occur despite best attempts at proofreading.   Final Clinical Impressions(s) / ED Diagnoses   Final diagnoses:  Generalized weakness    ED Discharge Orders    None       Desma Mcgregor 04/11/19 1515    Hayden Rasmussen, MD 04/11/19 1659

## 2019-04-11 NOTE — ED Triage Notes (Signed)
Transported by [facility] from home-- experienced a fall this morning attempting to walk to the bathroom. Denies neck or back pain. +weakness and dizziness that started this AM. Recently admitted last week for sepsis. Hx of leukemia and colon cancer.

## 2019-04-11 NOTE — ED Notes (Signed)
ED Provider at bedside. 

## 2019-04-13 ENCOUNTER — Other Ambulatory Visit: Payer: Self-pay

## 2019-04-13 ENCOUNTER — Inpatient Hospital Stay (HOSPITAL_BASED_OUTPATIENT_CLINIC_OR_DEPARTMENT_OTHER): Payer: Medicare Other | Admitting: Oncology

## 2019-04-13 ENCOUNTER — Inpatient Hospital Stay: Payer: Medicare Other | Attending: Oncology

## 2019-04-13 VITALS — BP 142/81 | HR 83 | Temp 97.8°F | Resp 18 | Ht 67.0 in | Wt 135.5 lb

## 2019-04-13 DIAGNOSIS — Z85048 Personal history of other malignant neoplasm of rectum, rectosigmoid junction, and anus: Secondary | ICD-10-CM | POA: Diagnosis not present

## 2019-04-13 DIAGNOSIS — D696 Thrombocytopenia, unspecified: Secondary | ICD-10-CM

## 2019-04-13 DIAGNOSIS — C9110 Chronic lymphocytic leukemia of B-cell type not having achieved remission: Secondary | ICD-10-CM

## 2019-04-13 DIAGNOSIS — C9111 Chronic lymphocytic leukemia of B-cell type in remission: Secondary | ICD-10-CM

## 2019-04-13 DIAGNOSIS — R42 Dizziness and giddiness: Secondary | ICD-10-CM

## 2019-04-13 LAB — CBC WITH DIFFERENTIAL (CANCER CENTER ONLY)
Abs Immature Granulocytes: 0.03 10*3/uL (ref 0.00–0.07)
Basophils Absolute: 0 10*3/uL (ref 0.0–0.1)
Basophils Relative: 0 %
Eosinophils Absolute: 0.1 10*3/uL (ref 0.0–0.5)
Eosinophils Relative: 2 %
HCT: 33.8 % — ABNORMAL LOW (ref 39.0–52.0)
Hemoglobin: 11.3 g/dL — ABNORMAL LOW (ref 13.0–17.0)
Immature Granulocytes: 0 %
Lymphocytes Relative: 53 %
Lymphs Abs: 4 10*3/uL (ref 0.7–4.0)
MCH: 29 pg (ref 26.0–34.0)
MCHC: 33.4 g/dL (ref 30.0–36.0)
MCV: 86.7 fL (ref 80.0–100.0)
Monocytes Absolute: 0.6 10*3/uL (ref 0.1–1.0)
Monocytes Relative: 7 %
Neutro Abs: 2.9 10*3/uL (ref 1.7–7.7)
Neutrophils Relative %: 38 %
Platelet Count: 85 10*3/uL — ABNORMAL LOW (ref 150–400)
RBC: 3.9 MIL/uL — ABNORMAL LOW (ref 4.22–5.81)
RDW: 14.1 % (ref 11.5–15.5)
WBC Count: 7.7 10*3/uL (ref 4.0–10.5)
nRBC: 0 % (ref 0.0–0.2)

## 2019-04-13 LAB — CMP (CANCER CENTER ONLY)
ALT: 18 U/L (ref 0–44)
AST: 16 U/L (ref 15–41)
Albumin: 4.2 g/dL (ref 3.5–5.0)
Alkaline Phosphatase: 104 U/L (ref 38–126)
Anion gap: 12 (ref 5–15)
BUN: 20 mg/dL (ref 8–23)
CO2: 24 mmol/L (ref 22–32)
Calcium: 9.2 mg/dL (ref 8.9–10.3)
Chloride: 105 mmol/L (ref 98–111)
Creatinine: 1.54 mg/dL — ABNORMAL HIGH (ref 0.61–1.24)
GFR, Est AFR Am: 50 mL/min — ABNORMAL LOW (ref >60)
GFR, Estimated: 43 mL/min — ABNORMAL LOW (ref >60)
Glucose, Bld: 196 mg/dL — ABNORMAL HIGH (ref 70–99)
Potassium: 4.3 mmol/L (ref 3.5–5.1)
Sodium: 141 mmol/L (ref 135–145)
Total Bilirubin: 0.9 mg/dL (ref 0.3–1.2)
Total Protein: 6.7 g/dL (ref 6.5–8.1)

## 2019-04-13 NOTE — Progress Notes (Signed)
Hematology and Oncology Follow Up Visit  [name] [id number] 1940-10-15 79 y.o. 04/13/2019 2:49 PM [name], [name], MD   Principle Diagnosis: 78 year old man with:   1.  T2N0 rectal cancer diagnosed in April 2009.  Remains disease-free at this time without any evidence of relapse.  2. CLL presented with lymphocytosis in August 2010.   3.  Thrombocytopenia: Appears to be reactive without any active bleeding noted.   Prior Therapy: He is status post FCR chemotherapy completed on on April of 2013 and accomplished complete response and remains in remission at this time.  Current therapy: Active surveillance.  Interim History: [name] is here for a follow-up.  Since last visit, he was hospitalized in June 2020 after presenting with a syncopal episode with work-up on revealing.  He presented again on 04/11/2019 with symptoms of weakness and was evaluated in the emergency department without any clear-cut diagnosis.  He is work-up including CT scan of the chest abdomen pelvis which did not show any acute pathology.  Clinically, he reports feeling slightly better although still relatively weak.  He is eating better although his appetite still relatively down.  He denies any bleeding complications.  He denies any fevers chills or sweats.  He still able to drive and attends to activities of daily living.  He denied any alteration mental status, neuropathy, confusion or dizziness.  Denies any headaches or lethargy.  Denies any night sweats, weight loss or changes in appetite.  Denied orthopnea, dyspnea on exertion or chest discomfort.  Denies shortness of breath, difficulty breathing hemoptysis or cough.  Denies any abdominal distention, nausea, early satiety or dyspepsia.  Denies any hematuria, frequency, dysuria or nocturia.  Denies any skin irritation, dryness or rash.  Denies any ecchymosis or petechiae.  Denies any lymphadenopathy or clotting.  Denies any heat or cold  intolerance.  Denies any anxiety or depression.  Remaining review of system is negative.       Medications: Reviewed today and updated. Current Outpatient Medications  Medication Sig Dispense Refill  . aspirin 81 MG tablet Take 81 mg by mouth every morning.     Marland Kitchen atorvastatin (LIPITOR) 40 MG tablet Take 40 mg by mouth every evening.     . calcium carbonate (TUMS - DOSED IN MG ELEMENTAL CALCIUM) 500 MG chewable tablet Chew 1 tablet by mouth daily as needed for indigestion or heartburn.    Marland Kitchen lisinopril (ZESTRIL) 20 MG tablet Take 20 mg by mouth every morning.    . metFORMIN (GLUCOPHAGE) 1000 MG tablet Take 1,000 mg by mouth 2 (two) times daily with a meal.  0  . metoprolol tartrate (LOPRESSOR) 25 MG tablet Take 0.5 tablets (12.5 mg total) by mouth 2 (two) times daily. 30 tablet 2  . Multiple Vitamin (MULTIVITAMIN WITH MINERALS) TABS tablet Take 1 tablet by mouth daily.    . pantoprazole (PROTONIX) 40 MG tablet Take 1 tablet (40 mg total) by mouth daily. (Patient not taking: Reported on 04/11/2019) 90 tablet 0  . prochlorperazine (COMPAZINE) 10 MG tablet TAKE 1 TABLET (10 MG TOTAL) BY MOUTH EVERY 6 (SIX) HOURS AS NEEDED FOR NAUSEA OR VOMITING. 30 tablet 1  . traZODone (DESYREL) 50 MG tablet Take 1 tablet (50 mg total) by mouth at bedtime as needed for up to 60 doses for sleep. 30 tablet 1   No current facility-administered medications for this visit.      Allergies:  Allergies  Allergen Reactions  . Hydrocodone-Acetaminophen Other (See Comments)    Loss  of weight    Past Medical History, Surgical history, Social history, and Family History reviewed without any changes.  Physical Exam: Blood pressure (!) 142/81, pulse 83, temperature 97.8 F (36.6 C), temperature source Oral, resp. rate 18, height 5' 7"  (1.702 m), weight 135 lb 8 oz (61.5 kg), SpO2 100 %.   ECOG: 1    General appearance: Comfortable appearing without any discomfort Head: Normocephalic without any  trauma Oropharynx: Mucous membranes are moist and pink without any thrush or ulcers. Eyes: Pupils are equal and round reactive to light. Lymph nodes: No cervical, supraclavicular, inguinal or axillary lymphadenopathy.   Heart:regular rate and rhythm.  S1 and S2 without leg edema. Lung: Clear without any rhonchi or wheezes.  No dullness to percussion. Abdomin: Soft, nontender, nondistended with good bowel sounds.  No hepatosplenomegaly. Musculoskeletal: No joint deformity or effusion.  Full range of motion noted. Neurological: No deficits noted on motor, sensory and deep tendon reflex exam. Skin: No petechial rash or dryness.  Appeared moist.  Psychiatric: Mood and affect appeared appropriate.    Lab Results: Lab Results  Component Value Date   WBC 7.2 04/11/2019   HGB 12.0 (L) 04/11/2019   HCT 35.6 (L) 04/11/2019   MCV 88.1 04/11/2019   PLT 78 (L) 04/11/2019     Chemistry      Component Value Date/Time   NA 142 04/11/2019 0927   NA 140 07/13/2017 1456   K 3.7 04/11/2019 0927   K 4.6 07/13/2017 1456   CL 104 04/11/2019 0927   CL 104 01/10/2013 1128   CO2 24 04/11/2019 0927   CO2 24 07/13/2017 1456   BUN 16 04/11/2019 0927   BUN 18.9 07/13/2017 1456   CREATININE 1.49 (H) 04/11/2019 0927   CREATININE 1.38 (H) 07/13/2018 1137   CREATININE 1.2 07/13/2017 1456      Component Value Date/Time   CALCIUM 9.3 04/11/2019 0927   CALCIUM 9.5 07/13/2017 1456   ALKPHOS 105 04/11/2019 0927   ALKPHOS 121 07/13/2017 1456   AST 21 04/11/2019 0927   AST 13 (L) 07/13/2018 1137   AST 17 07/13/2017 1456   ALT 23 04/11/2019 0927   ALT 12 07/13/2018 1137   ALT 19 07/13/2017 1456   BILITOT 1.3 (H) 04/11/2019 0927   BILITOT 1.5 (H) 07/13/2018 1137   BILITOT 0.60 07/13/2017 1456      CLINICAL DATA:  Colon cancer. Previous surgery and chemotherapy. Chronic lymphocytic leukemia.  EXAM: CT CHEST WITH CONTRAST  CT ABDOMEN WITHOUT AND WITH CONTRAST  TECHNIQUE: Multidetector CT  imaging of the abdomen was performed without intravenous contrast. Multidetector CT imaging of the chest and abdomen was then performed during bolus administration of intravenous contrast.  CONTRAST:  180m OMNIPAQUE IOHEXOL 300 MG/ML  SOLN  COMPARISON:  AP CT on 02/03/2018, and chest CT 10/24/2012  FINDINGS: CT CHEST FINDINGS  Cardiovascular: No acute findings. Aortic atherosclerosis.  Mediastinum/Lymph Nodes: Mild bilateral subpectoral and axillary lymphadenopathy is seen. Index lymph node left axilla measures 1.5 cm on image 21/3. No mediastinal or hilar lymphadenopathy identified.  Lungs/Pleura: No pulmonary infiltrate or mass identified. No effusion present.  Musculoskeletal:  No suspicious bone lesions identified.  CT ABDOMEN AND PELVIS FINDINGS  Hepatobiliary: No masses identified. Multiple small hepatic cysts remain stable. Gallbladder is unremarkable.  Pancreas:  No mass or inflammatory changes.  Spleen:  Within normal limits in size and appearance.  Adrenals/Urinary tract: Several small bilateral renal cysts again noted. No masses or hydronephrosis.  Stomach/Bowel: Surgical staples again seen  in the sigmoid colon. No evidence of obstruction, inflammatory process, or abnormal fluid collections. Normal appendix visualized.  Vascular/Lymphatic: New mild lymphadenopathy is seen in the left paraaortic region, bilateral iliac chains, and central small bowel mesentery. Largest index lymph node in the left external iliac region measures 1.7 cm and in the right external iliac region measures 1.3 cm. Aortic atherosclerosis. No abdominal aortic aneurysm.  Reproductive: Stable mildly enlarged prostate gland with central calcification.  Other:  None.  Musculoskeletal:  No suspicious bone lesions identified.  IMPRESSION: New mild lymphadenopathy in the chest, abdomen, and pelvis, most consistent with chronic lymphocytic leukemia.  No definite  evidence of recurrent or metastatic colon carcinoma.      Impression and Plan:  78 year old man with  1.  CLL diagnosed in 2009 after presenting with lymphadenopathy and bone marrow biopsy confirmed the diagnosis.  He has been in remission after chemotherapy since 2013.  He is repeat imaging studies obtained in June 2020 was personally reviewed and did show some mild adenopathy although it does not appear to be bulky or symptomatic at this time.  I do not anticipate that his symptoms are related to these borderline enlarged lymph nodes.  His could also be reactive in nature related to acute illness.  Treatment options for CLL were reviewed today in case he needs them.  Repeat systemic chemotherapy, oral targeted therapy among others would be considered.  I do not recommend proceeding with any anti-CLL treatment at this time given the mild nature of his lymphadenopathy.  His CBC was also reviewed and other than mild thrombocytopenia no acute findings noted.  I do recommend close monitoring and lower threshold to treating him if his symptoms persist in the next few months.  2.  Rectal cancer diagnosed in 2009.  He remains in remission without any evidence of relapsed disease.  3. Thrombocytopenia: Continues to be mild and fluctuating.  This appears to be autoimmune in nature rather than marrow failure related.  Repeat bone marrow biopsy may be needed if other cytopenias are detected.  4.  Recent dizziness and presyncopal episode: Unclear etiology at this time I doubt this is related to his CLL but we will continue to monitor.  5. Followup: Will be in 3 months for repeat evaluation.  25  minutes was spent with the patient face-to-face today.  More than 50% of time was spent on reviewing his disease status, reviewing imaging studies, discussing the differential diagnosis and management options in the future.    [name], MD 7/16/20202:49 PM

## 2019-04-17 ENCOUNTER — Telehealth: Payer: Self-pay | Admitting: Oncology

## 2019-04-17 NOTE — Telephone Encounter (Signed)
Called and spoke with patient. Confirmed date and time  °

## 2019-05-11 DIAGNOSIS — G4700 Insomnia, unspecified: Secondary | ICD-10-CM | POA: Diagnosis not present

## 2019-05-11 DIAGNOSIS — T887XXA Unspecified adverse effect of drug or medicament, initial encounter: Secondary | ICD-10-CM | POA: Diagnosis not present

## 2019-06-19 DIAGNOSIS — Z23 Encounter for immunization: Secondary | ICD-10-CM | POA: Diagnosis not present

## 2019-07-28 ENCOUNTER — Other Ambulatory Visit: Payer: Self-pay

## 2019-07-28 ENCOUNTER — Inpatient Hospital Stay: Payer: Medicare Other

## 2019-07-28 ENCOUNTER — Inpatient Hospital Stay: Payer: Medicare Other | Attending: Oncology | Admitting: Oncology

## 2019-07-28 VITALS — BP 156/78 | HR 96 | Temp 98.2°F | Resp 18 | Ht 67.0 in | Wt 133.3 lb

## 2019-07-28 DIAGNOSIS — C9111 Chronic lymphocytic leukemia of B-cell type in remission: Secondary | ICD-10-CM | POA: Diagnosis not present

## 2019-07-28 DIAGNOSIS — Z85048 Personal history of other malignant neoplasm of rectum, rectosigmoid junction, and anus: Secondary | ICD-10-CM | POA: Insufficient documentation

## 2019-07-28 DIAGNOSIS — C9110 Chronic lymphocytic leukemia of B-cell type not having achieved remission: Secondary | ICD-10-CM | POA: Diagnosis not present

## 2019-07-28 DIAGNOSIS — Z7982 Long term (current) use of aspirin: Secondary | ICD-10-CM | POA: Insufficient documentation

## 2019-07-28 DIAGNOSIS — Z7984 Long term (current) use of oral hypoglycemic drugs: Secondary | ICD-10-CM | POA: Insufficient documentation

## 2019-07-28 DIAGNOSIS — Z79899 Other long term (current) drug therapy: Secondary | ICD-10-CM | POA: Insufficient documentation

## 2019-07-28 DIAGNOSIS — R630 Anorexia: Secondary | ICD-10-CM | POA: Diagnosis not present

## 2019-07-28 DIAGNOSIS — D6959 Other secondary thrombocytopenia: Secondary | ICD-10-CM | POA: Insufficient documentation

## 2019-07-28 LAB — CBC WITH DIFFERENTIAL (CANCER CENTER ONLY)
Abs Immature Granulocytes: 0.04 10*3/uL (ref 0.00–0.07)
Basophils Absolute: 0 10*3/uL (ref 0.0–0.1)
Basophils Relative: 0 %
Eosinophils Absolute: 0.2 10*3/uL (ref 0.0–0.5)
Eosinophils Relative: 1 %
HCT: 35 % — ABNORMAL LOW (ref 39.0–52.0)
Hemoglobin: 11.9 g/dL — ABNORMAL LOW (ref 13.0–17.0)
Immature Granulocytes: 0 %
Lymphocytes Relative: 72 %
Lymphs Abs: 9.8 10*3/uL — ABNORMAL HIGH (ref 0.7–4.0)
MCH: 29.7 pg (ref 26.0–34.0)
MCHC: 34 g/dL (ref 30.0–36.0)
MCV: 87.3 fL (ref 80.0–100.0)
Monocytes Absolute: 0.9 10*3/uL (ref 0.1–1.0)
Monocytes Relative: 6 %
Neutro Abs: 3 10*3/uL (ref 1.7–7.7)
Neutrophils Relative %: 21 %
Platelet Count: 109 10*3/uL — ABNORMAL LOW (ref 150–400)
RBC: 4.01 MIL/uL — ABNORMAL LOW (ref 4.22–5.81)
RDW: 14.4 % (ref 11.5–15.5)
WBC Count: 13.8 10*3/uL — ABNORMAL HIGH (ref 4.0–10.5)
nRBC: 0 % (ref 0.0–0.2)

## 2019-07-28 LAB — CMP (CANCER CENTER ONLY)
ALT: 35 U/L (ref 0–44)
AST: 29 U/L (ref 15–41)
Albumin: 4.3 g/dL (ref 3.5–5.0)
Alkaline Phosphatase: 131 U/L — ABNORMAL HIGH (ref 38–126)
Anion gap: 11 (ref 5–15)
BUN: 20 mg/dL (ref 8–23)
CO2: 25 mmol/L (ref 22–32)
Calcium: 9.7 mg/dL (ref 8.9–10.3)
Chloride: 106 mmol/L (ref 98–111)
Creatinine: 1.66 mg/dL — ABNORMAL HIGH (ref 0.61–1.24)
GFR, Est AFR Am: 46 mL/min — ABNORMAL LOW (ref >60)
GFR, Estimated: 39 mL/min — ABNORMAL LOW (ref >60)
Glucose, Bld: 126 mg/dL — ABNORMAL HIGH (ref 70–99)
Potassium: 4.3 mmol/L (ref 3.5–5.1)
Sodium: 142 mmol/L (ref 135–145)
Total Bilirubin: 0.8 mg/dL (ref 0.3–1.2)
Total Protein: 6.9 g/dL (ref 6.5–8.1)

## 2019-07-28 NOTE — Progress Notes (Signed)
Hematology and Oncology Follow Up Visit  [name] [id number] 12/19/1940 78 y.o. 07/28/2019 3:07 PM [name], MDReade, [name], [name]   Principle Diagnosis: 78 year old man with:   1.  No cancer diagnosed in 2009 and remains disease free since that time.  He was found to have T2N0 and received definitive therapy at that time.  2. CLL diagnosed in 2010.  He is currently in remission after presenting initially with lymphocytosis.    3.  Thrombocytopenia related to his CLL and reactive process.   Prior Therapy: He is status post FCR chemotherapy completed on on April of 2013 and accomplished complete response and remains in remission at this time.  Current therapy: Active surveillance.  Interim History: [name] returns today for a follow-up visit.  Since the last visit, he reports no major changes in his health.  He continues to be active and attends activities of daily living.  He has periodic issues with anorexia losing his appetite and of lost close to 10 pounds in the last 2 years.  She still able to enjoy number of meals and does take nutritional supplements including Ensure.  He denies any fevers or chills.  He denies any painful adenopathy.  Denies any syncope or falls.  His performance status and quality of life is unchanged.  Patient denied any alteration mental status, neuropathy, confusion or dizziness.  Denies any headaches or lethargy.  Denies any night sweats, weight loss or changes in appetite.  Denied orthopnea, dyspnea on exertion or chest discomfort.  Denies shortness of breath, difficulty breathing hemoptysis or cough.  Denies any abdominal distention, nausea, early satiety or dyspepsia.  Denies any hematuria, frequency, dysuria or nocturia.  Denies any skin irritation, dryness or rash.  Denies any ecchymosis or petechiae.  Denies any lymphadenopathy or clotting.  Denies any heat or cold intolerance.  Denies any anxiety or depression.  Remaining review of system is  negative.          Medications: Reviewed without any changes. Current Outpatient Medications  Medication Sig Dispense Refill  . aspirin 81 MG tablet Take 81 mg by mouth every morning.     Marland Kitchen atorvastatin (LIPITOR) 40 MG tablet Take 40 mg by mouth every evening.     . calcium carbonate (TUMS - DOSED IN MG ELEMENTAL CALCIUM) 500 MG chewable tablet Chew 1 tablet by mouth daily as needed for indigestion or heartburn.    Marland Kitchen lisinopril (ZESTRIL) 20 MG tablet Take 20 mg by mouth every morning.    . metFORMIN (GLUCOPHAGE) 1000 MG tablet Take 1,000 mg by mouth 2 (two) times daily with a meal.  0  . metoprolol tartrate (LOPRESSOR) 25 MG tablet Take 0.5 tablets (12.5 mg total) by mouth 2 (two) times daily. 30 tablet 2  . Multiple Vitamin (MULTIVITAMIN WITH MINERALS) TABS tablet Take 1 tablet by mouth daily.    . pantoprazole (PROTONIX) 40 MG tablet Take 1 tablet (40 mg total) by mouth daily. (Patient not taking: Reported on 04/11/2019) 90 tablet 0  . prochlorperazine (COMPAZINE) 10 MG tablet TAKE 1 TABLET (10 MG TOTAL) BY MOUTH EVERY 6 (SIX) HOURS AS NEEDED FOR NAUSEA OR VOMITING. 30 tablet 1  . traZODone (DESYREL) 50 MG tablet Take 1 tablet (50 mg total) by mouth at bedtime as needed for up to 60 doses for sleep. 30 tablet 1   No current facility-administered medications for this visit.      Allergies:  Allergies  Allergen Reactions  . Hydrocodone-Acetaminophen Other (See Comments)  Loss of weight    Past Medical History, Surgical history, Social history, and Family History updated without any change.  Physical Exam: Blood pressure (!) 156/78, pulse 96, temperature 98.2 F (36.8 C), temperature source Oral, resp. rate 18, height 5\' 7"  (1.702 m), weight 133 lb 4.8 oz (60.5 kg), SpO2 100 %.    ECOG: 1   General appearance: Alert, awake without any distress. Head: Atraumatic without abnormalities Oropharynx: Without any thrush or ulcers. Eyes: No scleral icterus. Lymph nodes: No  lymphadenopathy noted in the cervical, supraclavicular, or axillary nodes Heart:regular rate and rhythm, without any murmurs or gallops.   Lung: Clear to auscultation without any rhonchi, wheezes or dullness to percussion. Abdomin: Soft, nontender without any shifting dullness or ascites. Musculoskeletal: No clubbing or cyanosis. Neurological: No motor or sensory deficits. Skin: No rashes or lesions.    Lab Results: Lab Results  Component Value Date   WBC 7.7 04/13/2019   HGB 11.3 (L) 04/13/2019   HCT 33.8 (L) 04/13/2019   MCV 86.7 04/13/2019   PLT 85 (L) 04/13/2019     Chemistry      Component Value Date/Time   NA 141 04/13/2019 1439   NA 140 07/13/2017 1456   K 4.3 04/13/2019 1439   K 4.6 07/13/2017 1456   CL 105 04/13/2019 1439   CL 104 01/10/2013 1128   CO2 24 04/13/2019 1439   CO2 24 07/13/2017 1456   BUN 20 04/13/2019 1439   BUN 18.9 07/13/2017 1456   CREATININE 1.54 (H) 04/13/2019 1439   CREATININE 1.2 07/13/2017 1456      Component Value Date/Time   CALCIUM 9.2 04/13/2019 1439   CALCIUM 9.5 07/13/2017 1456   ALKPHOS 104 04/13/2019 1439   ALKPHOS 121 07/13/2017 1456   AST 16 04/13/2019 1439   AST 17 07/13/2017 1456   ALT 18 04/13/2019 1439   ALT 19 07/13/2017 1456   BILITOT 0.9 04/13/2019 1439   BILITOT 0.60 07/13/2017 1456      IMPRESSION: New mild lymphadenopathy in the chest, abdomen, and pelvis, most consistent with chronic lymphocytic leukemia.  No definite evidence of recurrent or metastatic colon carcinoma.      Impression and Plan:  78 year old man with  1.  CLL presented with lymphocytosis and adenopathy 2009.  He status post therapy outlined above and remains without any indication for treatment.  The natural course of this disease was reviewed today as well as indication for therapy.  This would include painful adenopathy as well as bone marrow involvement and failure.  He does have a slight increase in his white cell count  currently at 13.8 although he is asymptomatic.  Treatment options would include systemic chemotherapy, oral targeted therapy with ibrutinib as well as combination of both of those 2.  CT scan obtained on 03/25/2019 showed very borderline enlargement of lymph nodes and do not require treatments at this time.  I recommended continued active surveillance and consideration to start therapy if he develops symptomatic progression.   2.  Rectal cancer treated then in 2009 without any evidence of relapsed disease at this time.    3. Thrombocytopenia: Related to CLL, autoimmune disease or possibly reactive.  His platelet count currently at 109 without any active bleeding and does not require any intervention.  4.  Anorexia: I do not believe it is related to his CLL at this time.  I recommended continued nutritional supplements and monitoring at this time.  5. Followup: In 4 months for repeat evaluation.  25  minutes was spent with the patient face-to-face today.  More than 50% of time was dedicated to reviewing his laboratory data, disease status update and management options for the future.    [name], [name] 10/30/20203:07 PM

## 2019-07-31 ENCOUNTER — Telehealth: Payer: Self-pay | Admitting: Oncology

## 2019-07-31 NOTE — Telephone Encounter (Signed)
Scheduled appt per 10/30 los. ° °Sent a staff message to get a calendar mailed out. °

## 2019-08-10 DIAGNOSIS — K219 Gastro-esophageal reflux disease without esophagitis: Secondary | ICD-10-CM | POA: Diagnosis not present

## 2019-08-10 DIAGNOSIS — Z0000 Encounter for general adult medical examination without abnormal findings: Secondary | ICD-10-CM | POA: Diagnosis not present

## 2019-08-10 DIAGNOSIS — C9110 Chronic lymphocytic leukemia of B-cell type not having achieved remission: Secondary | ICD-10-CM | POA: Diagnosis not present

## 2019-08-10 DIAGNOSIS — D696 Thrombocytopenia, unspecified: Secondary | ICD-10-CM | POA: Diagnosis not present

## 2019-08-10 DIAGNOSIS — Z1389 Encounter for screening for other disorder: Secondary | ICD-10-CM | POA: Diagnosis not present

## 2019-08-10 DIAGNOSIS — G4700 Insomnia, unspecified: Secondary | ICD-10-CM | POA: Diagnosis not present

## 2019-08-10 DIAGNOSIS — N529 Male erectile dysfunction, unspecified: Secondary | ICD-10-CM | POA: Diagnosis not present

## 2019-08-10 DIAGNOSIS — I10 Essential (primary) hypertension: Secondary | ICD-10-CM | POA: Diagnosis not present

## 2019-08-10 DIAGNOSIS — E1169 Type 2 diabetes mellitus with other specified complication: Secondary | ICD-10-CM | POA: Diagnosis not present

## 2019-08-10 DIAGNOSIS — E7800 Pure hypercholesterolemia, unspecified: Secondary | ICD-10-CM | POA: Diagnosis not present

## 2019-10-02 DIAGNOSIS — D72829 Elevated white blood cell count, unspecified: Secondary | ICD-10-CM | POA: Diagnosis not present

## 2019-10-02 DIAGNOSIS — N1831 Chronic kidney disease, stage 3a: Secondary | ICD-10-CM | POA: Diagnosis not present

## 2019-10-18 DIAGNOSIS — N183 Chronic kidney disease, stage 3 unspecified: Secondary | ICD-10-CM | POA: Diagnosis not present

## 2019-11-10 DIAGNOSIS — N1831 Chronic kidney disease, stage 3a: Secondary | ICD-10-CM | POA: Diagnosis not present

## 2019-11-28 ENCOUNTER — Telehealth: Payer: Self-pay

## 2019-11-28 ENCOUNTER — Inpatient Hospital Stay: Payer: Medicare Other | Attending: Oncology

## 2019-11-28 ENCOUNTER — Inpatient Hospital Stay (HOSPITAL_BASED_OUTPATIENT_CLINIC_OR_DEPARTMENT_OTHER): Payer: Medicare Other | Admitting: Oncology

## 2019-11-28 ENCOUNTER — Other Ambulatory Visit: Payer: Self-pay

## 2019-11-28 VITALS — BP 133/59 | HR 66 | Temp 97.8°F | Resp 16 | Ht 67.0 in | Wt 133.2 lb

## 2019-11-28 DIAGNOSIS — Z7982 Long term (current) use of aspirin: Secondary | ICD-10-CM | POA: Diagnosis not present

## 2019-11-28 DIAGNOSIS — Z9221 Personal history of antineoplastic chemotherapy: Secondary | ICD-10-CM | POA: Insufficient documentation

## 2019-11-28 DIAGNOSIS — D696 Thrombocytopenia, unspecified: Secondary | ICD-10-CM | POA: Diagnosis not present

## 2019-11-28 DIAGNOSIS — C9110 Chronic lymphocytic leukemia of B-cell type not having achieved remission: Secondary | ICD-10-CM

## 2019-11-28 DIAGNOSIS — Z7984 Long term (current) use of oral hypoglycemic drugs: Secondary | ICD-10-CM | POA: Insufficient documentation

## 2019-11-28 DIAGNOSIS — Z79899 Other long term (current) drug therapy: Secondary | ICD-10-CM | POA: Diagnosis not present

## 2019-11-28 DIAGNOSIS — Z85048 Personal history of other malignant neoplasm of rectum, rectosigmoid junction, and anus: Secondary | ICD-10-CM | POA: Diagnosis not present

## 2019-11-28 LAB — CBC WITH DIFFERENTIAL (CANCER CENTER ONLY)
Abs Immature Granulocytes: 0.1 10*3/uL — ABNORMAL HIGH (ref 0.00–0.07)
Basophils Absolute: 0 10*3/uL (ref 0.0–0.1)
Basophils Relative: 0 %
Eosinophils Absolute: 0.2 10*3/uL (ref 0.0–0.5)
Eosinophils Relative: 0 %
HCT: 32.1 % — ABNORMAL LOW (ref 39.0–52.0)
Hemoglobin: 10.4 g/dL — ABNORMAL LOW (ref 13.0–17.0)
Immature Granulocytes: 0 %
Lymphocytes Relative: 78 %
Lymphs Abs: 41.9 10*3/uL — ABNORMAL HIGH (ref 0.7–4.0)
MCH: 30.1 pg (ref 26.0–34.0)
MCHC: 32.4 g/dL (ref 30.0–36.0)
MCV: 93 fL (ref 80.0–100.0)
Monocytes Absolute: 9 10*3/uL — ABNORMAL HIGH (ref 0.1–1.0)
Monocytes Relative: 17 %
Neutro Abs: 2.7 10*3/uL (ref 1.7–7.7)
Neutrophils Relative %: 5 %
Platelet Count: 87 10*3/uL — ABNORMAL LOW (ref 150–400)
RBC: 3.45 MIL/uL — ABNORMAL LOW (ref 4.22–5.81)
RDW: 15.2 % (ref 11.5–15.5)
WBC Count: 53.9 10*3/uL (ref 4.0–10.5)
nRBC: 0 % (ref 0.0–0.2)

## 2019-11-28 LAB — CMP (CANCER CENTER ONLY)
ALT: 25 U/L (ref 0–44)
AST: 26 U/L (ref 15–41)
Albumin: 4.4 g/dL (ref 3.5–5.0)
Alkaline Phosphatase: 152 U/L — ABNORMAL HIGH (ref 38–126)
Anion gap: 11 (ref 5–15)
BUN: 25 mg/dL — ABNORMAL HIGH (ref 8–23)
CO2: 24 mmol/L (ref 22–32)
Calcium: 9.4 mg/dL (ref 8.9–10.3)
Chloride: 110 mmol/L (ref 98–111)
Creatinine: 1.75 mg/dL — ABNORMAL HIGH (ref 0.61–1.24)
GFR, Est AFR Am: 43 mL/min — ABNORMAL LOW (ref >60)
GFR, Estimated: 37 mL/min — ABNORMAL LOW (ref >60)
Glucose, Bld: 97 mg/dL (ref 70–99)
Potassium: 4.3 mmol/L (ref 3.5–5.1)
Sodium: 145 mmol/L (ref 135–145)
Total Bilirubin: 0.7 mg/dL (ref 0.3–1.2)
Total Protein: 7.2 g/dL (ref 6.5–8.1)

## 2019-11-28 NOTE — Telephone Encounter (Signed)
Lab called with critical WBC 53.9. Dr. Alen Blew made aware, no new orders at this time.

## 2019-11-28 NOTE — Progress Notes (Signed)
Hematology and Oncology Follow Up Visit  Giuseppe Duchemin [id number] 24-Nov-1940 79 y.o. 11/28/2019 3:08 PM [name], MDReade, [name], MD   Principle Diagnosis: 79 year old man with CLL presented with lymphocytosis in 2010 and remains on active surveillance without any indication for repeat treatment.  Secondary diagnosis: T2N0 rectal cancer diagnosed in 2009 is currently in remission after surgical resection.    Prior Therapy: He is status post FCR chemotherapy completed on on April of 2013 and accomplished complete response and remains in remission at this time.  Current therapy: Active surveillance.  Interim History: Mr. Cubero is here for a follow-up visit.  Since the last visit, he reports no major changes in his health.  He denies any recent hospitalizations or illnesses.  He is appetite remains reasonable and his weight is unchanged.  He denies any fevers, chills or painful adenopathy.  He denies any constitutional symptoms or weight loss.  Performance status and quality of life remain excellent.          Medications: Updated on review. Current Outpatient Medications  Medication Sig Dispense Refill  . aspirin 81 MG tablet Take 81 mg by mouth every morning.     Marland Kitchen atorvastatin (LIPITOR) 40 MG tablet Take 40 mg by mouth every evening.     . calcium carbonate (TUMS - DOSED IN MG ELEMENTAL CALCIUM) 500 MG chewable tablet Chew 1 tablet by mouth daily as needed for indigestion or heartburn.    Marland Kitchen lisinopril (ZESTRIL) 20 MG tablet Take 20 mg by mouth every morning.    . metFORMIN (GLUCOPHAGE) 1000 MG tablet Take 1,000 mg by mouth 2 (two) times daily with a meal.  0  . metoprolol tartrate (LOPRESSOR) 25 MG tablet Take 0.5 tablets (12.5 mg total) by mouth 2 (two) times daily. 30 tablet 2  . Multiple Vitamin (MULTIVITAMIN WITH MINERALS) TABS tablet Take 1 tablet by mouth daily.    . pantoprazole (PROTONIX) 40 MG tablet Take 1 tablet (40 mg total) by mouth daily. (Patient not taking:  Reported on 04/11/2019) 90 tablet 0  . prochlorperazine (COMPAZINE) 10 MG tablet TAKE 1 TABLET (10 MG TOTAL) BY MOUTH EVERY 6 (SIX) HOURS AS NEEDED FOR NAUSEA OR VOMITING. 30 tablet 1  . traZODone (DESYREL) 50 MG tablet Take 1 tablet (50 mg total) by mouth at bedtime as needed for up to 60 doses for sleep. 30 tablet 1   No current facility-administered medications for this visit.     Allergies:  Allergies  Allergen Reactions  . Hydrocodone-Acetaminophen Other (See Comments)    Loss of weight     Physical Exam:  Blood pressure (!) 133/59, pulse 66, temperature 97.8 F (36.6 C), temperature source Temporal, resp. rate 16, height 5\' 7"  (1.702 m), weight 133 lb 3.2 oz (60.4 kg), SpO2 97 %.   ECOG: 1    General appearance: Comfortable appearing without any discomfort Head: Normocephalic without any trauma Oropharynx: Mucous membranes are moist and pink without any thrush or ulcers. Eyes: Pupils are equal and round reactive to light. Lymph nodes: No cervical, supraclavicular, inguinal or axillary lymphadenopathy.   Heart:regular rate and rhythm.  S1 and S2 without leg edema. Lung: Clear without any rhonchi or wheezes.  No dullness to percussion. Abdomin: Soft, nontender, nondistended with good bowel sounds.  No hepatosplenomegaly. Musculoskeletal: No joint deformity or effusion.  Full range of motion noted. Neurological: No deficits noted on motor, sensory and deep tendon reflex exam. Skin: No petechial rash or dryness.  Appeared moist.  Lab Results: Lab Results  Component Value Date   WBC 13.8 (H) 07/28/2019   HGB 11.9 (L) 07/28/2019   HCT 35.0 (L) 07/28/2019   MCV 87.3 07/28/2019   PLT 109 (L) 07/28/2019     Chemistry      Component Value Date/Time   NA 142 07/28/2019 1501   NA 140 07/13/2017 1456   K 4.3 07/28/2019 1501   K 4.6 07/13/2017 1456   CL 106 07/28/2019 1501   CL 104 01/10/2013 1128   CO2 25 07/28/2019 1501   CO2 24 07/13/2017 1456   BUN 20  07/28/2019 1501   BUN 18.9 07/13/2017 1456   CREATININE 1.66 (H) 07/28/2019 1501   CREATININE 1.2 07/13/2017 1456      Component Value Date/Time   CALCIUM 9.7 07/28/2019 1501   CALCIUM 9.5 07/13/2017 1456   ALKPHOS 131 (H) 07/28/2019 1501   ALKPHOS 121 07/13/2017 1456   AST 29 07/28/2019 1501   AST 17 07/13/2017 1456   ALT 35 07/28/2019 1501   ALT 19 07/13/2017 1456   BILITOT 0.8 07/28/2019 1501   BILITOT 0.60 07/13/2017 1456            Impression and Plan:  79 year old man with  1.  CLL diagnosed in 2010.  He was found to have lymphocytosis and adenopathy and currently on active surveillance.  The natural course of this disease was reviewed and indication for treatment were discussed.  These indication would include painful adenopathy, rapid rise in his white cell count as well as cytopenias.  His white cell count is showing increased although overall asymptomatic.  The rapid rise in his white cell count is concerning that he might require treatment in the near future.  Treatment options at this time would include ibrutinib versus chemotherapy utilizing bendamustine and rituximab.  I have recommended a short period of observation repeat CT scan for staging purposes and will defer start treatment for the time being.   2.  T2N0 rectal cancer diagnosed in 2009 without any evidence of relapsed disease.  3. Thrombocytopenia: Chronic fluctuating and likely related to CLL.  No active bleeding noted with a platelet count remains close to baseline.  4.  Anorexia: Weight is stable at this time.  5. Followup: In 3 months for repeat evaluation.  30  minutes were spent on this encounter.  The time was dedicated to reviewing his disease status, reviewing laboratory data and discussing treatment options.    [name], MD 3/2/20213:08 PM

## 2019-11-29 ENCOUNTER — Telehealth: Payer: Self-pay | Admitting: Oncology

## 2019-11-29 NOTE — Telephone Encounter (Signed)
Scheduled appt per 3/2 los.  Sent a message to HIM pool to get a calendar mailed out.

## 2020-02-08 ENCOUNTER — Encounter: Payer: Self-pay | Admitting: Oncology

## 2020-02-08 DIAGNOSIS — C9110 Chronic lymphocytic leukemia of B-cell type not having achieved remission: Secondary | ICD-10-CM | POA: Diagnosis not present

## 2020-02-08 DIAGNOSIS — K219 Gastro-esophageal reflux disease without esophagitis: Secondary | ICD-10-CM | POA: Diagnosis not present

## 2020-02-08 DIAGNOSIS — Z7984 Long term (current) use of oral hypoglycemic drugs: Secondary | ICD-10-CM | POA: Diagnosis not present

## 2020-02-08 DIAGNOSIS — E1169 Type 2 diabetes mellitus with other specified complication: Secondary | ICD-10-CM | POA: Diagnosis not present

## 2020-02-08 DIAGNOSIS — E7800 Pure hypercholesterolemia, unspecified: Secondary | ICD-10-CM | POA: Diagnosis not present

## 2020-02-08 DIAGNOSIS — D696 Thrombocytopenia, unspecified: Secondary | ICD-10-CM | POA: Diagnosis not present

## 2020-02-08 DIAGNOSIS — G4700 Insomnia, unspecified: Secondary | ICD-10-CM | POA: Diagnosis not present

## 2020-02-08 DIAGNOSIS — I10 Essential (primary) hypertension: Secondary | ICD-10-CM | POA: Diagnosis not present

## 2020-02-08 DIAGNOSIS — N529 Male erectile dysfunction, unspecified: Secondary | ICD-10-CM | POA: Diagnosis not present

## 2020-02-12 ENCOUNTER — Telehealth: Payer: Self-pay | Admitting: *Deleted

## 2020-02-12 ENCOUNTER — Telehealth: Payer: Self-pay | Admitting: Oncology

## 2020-02-12 NOTE — Telephone Encounter (Signed)
Mr Smoak left a message stating his WBC is 136.7

## 2020-02-12 NOTE — Telephone Encounter (Signed)
LM for medical records at Dr [name] [name]'s office to fax [place] the most recent labs re: WBC 136.7

## 2020-02-12 NOTE — Telephone Encounter (Signed)
Scheduled appt per 5/17 sch message- pt aware of appt date and time

## 2020-02-12 NOTE — Telephone Encounter (Signed)
We need to get copy of his labs.

## 2020-02-14 ENCOUNTER — Encounter (INDEPENDENT_AMBULATORY_CARE_PROVIDER_SITE_OTHER): Payer: Self-pay

## 2020-02-14 ENCOUNTER — Telehealth: Payer: Self-pay | Admitting: Pharmacist

## 2020-02-14 ENCOUNTER — Inpatient Hospital Stay: Payer: Medicare Other

## 2020-02-14 ENCOUNTER — Telehealth: Payer: Self-pay

## 2020-02-14 ENCOUNTER — Other Ambulatory Visit: Payer: Self-pay

## 2020-02-14 ENCOUNTER — Inpatient Hospital Stay: Payer: Medicare Other | Attending: Oncology | Admitting: Oncology

## 2020-02-14 VITALS — BP 136/58 | HR 106 | Temp 97.8°F | Resp 18 | Ht 67.0 in | Wt 128.0 lb

## 2020-02-14 DIAGNOSIS — D6959 Other secondary thrombocytopenia: Secondary | ICD-10-CM | POA: Insufficient documentation

## 2020-02-14 DIAGNOSIS — Z79899 Other long term (current) drug therapy: Secondary | ICD-10-CM | POA: Diagnosis not present

## 2020-02-14 DIAGNOSIS — R630 Anorexia: Secondary | ICD-10-CM | POA: Diagnosis not present

## 2020-02-14 DIAGNOSIS — Z7982 Long term (current) use of aspirin: Secondary | ICD-10-CM | POA: Diagnosis not present

## 2020-02-14 DIAGNOSIS — Z7984 Long term (current) use of oral hypoglycemic drugs: Secondary | ICD-10-CM | POA: Diagnosis not present

## 2020-02-14 DIAGNOSIS — Z85048 Personal history of other malignant neoplasm of rectum, rectosigmoid junction, and anus: Secondary | ICD-10-CM | POA: Insufficient documentation

## 2020-02-14 DIAGNOSIS — C9110 Chronic lymphocytic leukemia of B-cell type not having achieved remission: Secondary | ICD-10-CM | POA: Insufficient documentation

## 2020-02-14 LAB — CBC WITH DIFFERENTIAL (CANCER CENTER ONLY)
Abs Immature Granulocytes: 0 10*3/uL (ref 0.00–0.07)
Basophils Absolute: 0 10*3/uL (ref 0.0–0.1)
Basophils Relative: 0 %
Eosinophils Absolute: 0 10*3/uL (ref 0.0–0.5)
Eosinophils Relative: 0 %
HCT: 28.2 % — ABNORMAL LOW (ref 39.0–52.0)
Hemoglobin: 8.7 g/dL — ABNORMAL LOW (ref 13.0–17.0)
Lymphocytes Relative: 98 %
Lymphs Abs: 112.8 10*3/uL — ABNORMAL HIGH (ref 0.7–4.0)
MCH: 30.4 pg (ref 26.0–34.0)
MCHC: 30.9 g/dL (ref 30.0–36.0)
MCV: 98.6 fL (ref 80.0–100.0)
Monocytes Absolute: 1.2 10*3/uL — ABNORMAL HIGH (ref 0.1–1.0)
Monocytes Relative: 1 %
Neutro Abs: 1.2 10*3/uL — ABNORMAL LOW (ref 1.7–7.7)
Neutrophils Relative %: 1 %
Platelet Count: 66 10*3/uL — ABNORMAL LOW (ref 150–400)
RBC: 2.86 MIL/uL — ABNORMAL LOW (ref 4.22–5.81)
RDW: 16.4 % — ABNORMAL HIGH (ref 11.5–15.5)
WBC Count: 115.1 10*3/uL (ref 4.0–10.5)
nRBC: 0 % (ref 0.0–0.2)

## 2020-02-14 LAB — CMP (CANCER CENTER ONLY)
ALT: 20 U/L (ref 0–44)
AST: 21 U/L (ref 15–41)
Albumin: 4.4 g/dL (ref 3.5–5.0)
Alkaline Phosphatase: 163 U/L — ABNORMAL HIGH (ref 38–126)
Anion gap: 10 (ref 5–15)
BUN: 34 mg/dL — ABNORMAL HIGH (ref 8–23)
CO2: 24 mmol/L (ref 22–32)
Calcium: 9.9 mg/dL (ref 8.9–10.3)
Chloride: 106 mmol/L (ref 98–111)
Creatinine: 2.3 mg/dL — ABNORMAL HIGH (ref 0.61–1.24)
GFR, Est AFR Am: 31 mL/min — ABNORMAL LOW (ref >60)
GFR, Estimated: 26 mL/min — ABNORMAL LOW (ref >60)
Glucose, Bld: 219 mg/dL — ABNORMAL HIGH (ref 70–99)
Potassium: 4.6 mmol/L (ref 3.5–5.1)
Sodium: 140 mmol/L (ref 135–145)
Total Bilirubin: 0.8 mg/dL (ref 0.3–1.2)
Total Protein: 7 g/dL (ref 6.5–8.1)

## 2020-02-14 MED ORDER — IBRUTINIB 420 MG PO TABS: 420.0000 mg | ORAL_TABLET | Freq: Every day | ORAL | 0 refills | Status: DC

## 2020-02-14 NOTE — Telephone Encounter (Signed)
Oral Oncology Patient Advocate Encounter  Received notification from [place] that prior authorization for Imbruvica is required.  PA submitted on CoverMyMeds Key [id number] Status is pending  Oral Oncology Clinic will continue to follow.  [place] [facility] [place] Phone [phone] Fax [phone] 02/14/2020 2:47 PM

## 2020-02-14 NOTE — Telephone Encounter (Signed)
Oral Oncology Pharmacist Encounter  Received new prescription for Imbruvica (ibrutinib) for the treatment of CLL, planned duration until disease progression or unacceptable drug toxicity.  CMP from 02/14/20 assessed, no relevant lab abnormalities. Prescription dose and frequency assessed.   Current medication list in Epic reviewed, no relevant DDIs with ibrutinib identified.  Prescription has been e-scribed to the [facility] for benefits analysis and approval.  Oral Oncology Clinic will continue to follow for insurance authorization, copayment issues, initial counseling and start date.  Darl Pikes, PharmD, BCPS, BCOP, CPP Hematology/Oncology Clinical Pharmacist Practitioner [facility] [phone]  02/14/2020 2:58 PM

## 2020-02-14 NOTE — Telephone Encounter (Signed)
Oral Oncology Patient Advocate Encounter  Prior Authorization for [name] has been approved.    PA# [id number] Effective dates: 02/14/20 through 02/13/21  Patients co-pay is $2703.42  Oral Oncology Clinic will continue to follow.   [name] Phone [phone] Fax [phone] 02/14/2020 3:11 PM

## 2020-02-14 NOTE — Addendum Note (Signed)
Addended by: Wyatt Portela on: 02/14/2020 12:56 PM   Modules accepted: Orders

## 2020-02-14 NOTE — Progress Notes (Signed)
Hematology and Oncology Follow Up Visit  Jamani Bearce [id number] 1941-07-03 79 y.o. 02/14/2020 12:21 PM [name], [name], MD   Principle Diagnosis: 79 year old man with CLL diagnosed in 2010 on active surveillance since 2013.    Secondary diagnosis: T2N0 rectal cancer diagnosed in 2009 is currently in remission after surgical resection.    Prior Therapy: He is status post FCR chemotherapy completed on on April of 2013 and accomplished complete response and remains in remission at this time.  Current therapy: Under consideration to start treatment.  Interim History: Mr. Mcclintock returns today for a repeat evaluation.  Since the last visit, he reports no major complaints but does report overall decline in his energy and performance status.  He has reported decline in his appetite and of lost more weight.  He denies any fevers chills sweats or lymphadenopathy.  He denies any early satiety or leg edema.  He denies any recent hospitalizations or illnesses.  He did have a CBC obtained on May 13 which showed a white cell count of 136 with a hemoglobin of 9.8 and a platelet count of 82.         Medications: Reviewed without any changes. Current Outpatient Medications  Medication Sig Dispense Refill  . amLODipine (NORVASC) 5 MG tablet Take 5 mg by mouth every morning.    Marland Kitchen aspirin 81 MG tablet Take 81 mg by mouth every morning.     Marland Kitchen atorvastatin (LIPITOR) 40 MG tablet Take 40 mg by mouth every evening.     . calcium carbonate (TUMS - DOSED IN MG ELEMENTAL CALCIUM) 500 MG chewable tablet Chew 1 tablet by mouth daily as needed for indigestion or heartburn.    Marland Kitchen lisinopril (ZESTRIL) 20 MG tablet Take 20 mg by mouth every morning.    . metFORMIN (GLUCOPHAGE) 1000 MG tablet Take 1,000 mg by mouth 2 (two) times daily with a meal.  0  . metoprolol tartrate (LOPRESSOR) 25 MG tablet Take 0.5 tablets (12.5 mg total) by mouth 2 (two) times daily. 30 tablet 2  . Multiple Vitamin  (MULTIVITAMIN WITH MINERALS) TABS tablet Take 1 tablet by mouth daily.    . pantoprazole (PROTONIX) 40 MG tablet Take 1 tablet (40 mg total) by mouth daily. (Patient not taking: Reported on 04/11/2019) 90 tablet 0  . prochlorperazine (COMPAZINE) 10 MG tablet TAKE 1 TABLET (10 MG TOTAL) BY MOUTH EVERY 6 (SIX) HOURS AS NEEDED FOR NAUSEA OR VOMITING. 30 tablet 1  . SHINGRIX injection     . traZODone (DESYREL) 50 MG tablet Take 1 tablet (50 mg total) by mouth at bedtime as needed for up to 60 doses for sleep. 30 tablet 1  . zolpidem (AMBIEN) 10 MG tablet Take 5-10 mg by mouth at bedtime as needed.     No current facility-administered medications for this visit.     Allergies:  Allergies  Allergen Reactions  . Hydrocodone-Acetaminophen Other (See Comments)    Loss of weight     Physical Exam: Blood pressure (!) 136/58, pulse (!) 106, temperature 97.8 F (36.6 C), temperature source Temporal, resp. rate 18, height 5\' 7"  (1.702 m), weight 128 lb (58.1 kg), SpO2 100 %.     ECOG: 1   General appearance: Alert, awake without any distress. Head: Atraumatic without abnormalities Oropharynx: Without any thrush or ulcers. Eyes: No scleral icterus. Lymph nodes: No lymphadenopathy noted in the cervical, supraclavicular, or axillary nodes Heart:regular rate and rhythm, without any murmurs or gallops.   Lung: Clear to auscultation without  any rhonchi, wheezes or dullness to percussion. Abdomin: Soft, nontender without any shifting dullness or ascites. Musculoskeletal: No clubbing or cyanosis. Neurological: No motor or sensory deficits. Skin: No rashes or lesions.     Lab Results: Lab Results  Component Value Date   WBC 53.9 (HH) 11/28/2019   HGB 10.4 (L) 11/28/2019   HCT 32.1 (L) 11/28/2019   MCV 93.0 11/28/2019   PLT 87 (L) 11/28/2019     Chemistry      Component Value Date/Time   NA 145 11/28/2019 1501   NA 140 07/13/2017 1456   K 4.3 11/28/2019 1501   K 4.6 07/13/2017 1456    CL 110 11/28/2019 1501   CL 104 01/10/2013 1128   CO2 24 11/28/2019 1501   CO2 24 07/13/2017 1456   BUN 25 (H) 11/28/2019 1501   BUN 18.9 07/13/2017 1456   CREATININE 1.75 (H) 11/28/2019 1501   CREATININE 1.2 07/13/2017 1456      Component Value Date/Time   CALCIUM 9.4 11/28/2019 1501   CALCIUM 9.5 07/13/2017 1456   ALKPHOS 152 (H) 11/28/2019 1501   ALKPHOS 121 07/13/2017 1456   AST 26 11/28/2019 1501   AST 17 07/13/2017 1456   ALT 25 11/28/2019 1501   ALT 19 07/13/2017 1456   BILITOT 0.7 11/28/2019 1501   BILITOT 0.60 07/13/2017 1456            Impression and Plan:  79 year old man with  1.  CLL presented with lymphocytosis and has been in remission since 2013 after initially diagnosed in 2010.   The natural course of this disease was reviewed at this time and treatment options were discussed.  He has been experiencing increase in his white cell count indicating relapsed disease with possible need to start therapy in the near future.  Risks and benefits of starting ibrutinib were reviewed today.  Potential complications that include ecchymosis, bleeding or palpitation as well as cardiac arrhythmia were discussed.  Alternative options would be systemic chemotherapy as well as other oral targeted therapy.   CBC reviewed today which showed increase in his white cell count currently at 115.  His hemoglobin is also down which indicates regression of disease.  He does not require any growth factor support or transfusions at this time.  After discussion today he is willing to proceed with ibrutinib and we will allow update his staging scans which is scheduled in June 2021.   2.  Rectal cancer diagnosed with T2N0 disease in 2009.  He has no evidence of recurrence disease but repeat imaging studies in the near future will evaluate that.  3. Thrombocytopenia: Related to CLL although no active bleeding noted at this time.  We will continue to monitor.  I anticipate improvement  in his blood count as we treat his CLL.   4.  Anorexia: We discussed strategies to boost his appetite and nutritional status.  Encouraged him to take nutritional supplements.  5. Followup: In the next 4 to 5 weeks for repeat evaluation.  30  minutes were dedicated to this visit.  The time was spent on reviewing laboratory data, disease status update, treatment options and complications of therapy.    [name], MD 5/19/202112:21 PM

## 2020-02-15 ENCOUNTER — Telehealth: Payer: Self-pay | Admitting: Oncology

## 2020-02-15 NOTE — Telephone Encounter (Signed)
Scheduled appt per 5/19 los.  Spoke with pt and his spouse is aware of the appt date and time

## 2020-02-16 ENCOUNTER — Telehealth: Payer: Self-pay

## 2020-02-16 ENCOUNTER — Other Ambulatory Visit: Payer: Self-pay | Admitting: Oncology

## 2020-02-16 MED FILL — IMBRUVICA 420 MG TAB: 420 | 28 days supply | Qty: 28 | Fill #0

## 2020-02-16 NOTE — Telephone Encounter (Signed)
Called patient's wife and let her know Dr. Hazeline Junker response. She verbalized understanding and stated they will pick up Ibrutinib as soon as they get a call from the pharmacy that med is ready.

## 2020-02-16 NOTE — Telephone Encounter (Signed)
Oral Chemotherapy Pharmacist Encounter  Spoke with patient's wife Deneise Lever and she plan on picking up the [place] from [place] today 02/16/20. They know to get start when they have the medication.  Patient Education I spoke with Deneise Lever for overview of new oral chemotherapy medication: Imbruvica (ibrutinib) for the treatment of CLL, planned duration until disease progression or unacceptable drug toxicity.   Counseled Annie on administration, dosing, side effects, monitoring, drug-food interactions, safe handling, storage, and disposal. Patient will take 420 mg by mouth daily.  Side effects include but not limited to: fatigue, N/V, diarrhea, edema.    Reviewed with Deneise Lever importance of keeping a medication schedule and plan for any missed doses.  Mrs. Hands voiced understanding and appreciation. All questions answered. Medication handout placed in the mail.  Provided patient with [facility] phone number. Patient knows to call the office with questions or concerns. Oral Chemotherapy Navigation Clinic will continue to follow.  Darl Pikes, PharmD, BCPS, BCOP, CPP Hematology/Oncology Clinical Pharmacist Practitioner [facility] [phone]  02/16/2020 10:29 AM

## 2020-02-16 NOTE — Telephone Encounter (Signed)
-----   Message from Wyatt Portela, MD sent at 02/16/2020  9:24 AM EDT ----- This is not related to CLL.  He was prescribed ibrutinib to treat his CLL which might help with the dizziness if it is caused by his condition. Thanks ----- Message ----- From: Tami Lin, RN Sent: 02/16/2020   8:55 AM EDT To: Wyatt Portela, MD  Patient's wife called and wanted to make you aware that patient continues to experience dizziness and some unsteadiness when walking. She said patient said he told you about this at his visit on Wednesday and was told it was due to his CLL returning. She wants to make you aware again and see if there is anything that can be prescribed to help the patient with the dizziness.  Lanelle Bal

## 2020-02-19 ENCOUNTER — Telehealth: Payer: Self-pay

## 2020-02-19 NOTE — Telephone Encounter (Signed)
Oral Oncology Patient Advocate Encounter  Patient has been approved for copay assistance with [employer] (TAF).  The [place] [employer] will cover all copayment expenses for [facility] for the remainder of the calendar year.    The billing information is as follows and has been shared with [place].   Member ID: [id number] Group ID: [id number] PCN: [id number] BIN: [id number] Eligibility Dates: 09/29/19 to 09/27/20  Fund: [place] [facility] [place] Phone [phone] Fax [phone] 02/19/2020 2:28 PM

## 2020-02-20 ENCOUNTER — Telehealth: Payer: Self-pay | Admitting: Hematology

## 2020-02-20 ENCOUNTER — Other Ambulatory Visit: Payer: Self-pay | Admitting: Hematology

## 2020-02-20 MED ORDER — ONDANSETRON 8 MG PO TBDP
8.0000 mg | ORAL_TABLET | Freq: Three times a day (TID) | ORAL | 0 refills | Status: DC | PRN
Start: 2020-02-20 — End: 2020-02-29

## 2020-02-20 NOTE — Telephone Encounter (Signed)
Pt's wife called around 7:30pm and reported nausea and vomiting, which he had before, but got worse after he started Ibrutinib last week. He has been taking compazine every 6-8 hours which helps some. He has been drinking adequately, no fever,abdomijnal pain or dizziness, BM is normal. He ate some but vomited twice today. I encouraged him to continue compazine, and will call in zofran ODT for him. I will copy Dr. Alen Blew. If no improvement, he can be seen in our office in the next few days. She voiced good understanding and agrees with the plan.  [name]  02/20/2020

## 2020-02-21 ENCOUNTER — Telehealth: Payer: Self-pay

## 2020-02-21 NOTE — Telephone Encounter (Signed)
TC to Pt to follow up after call from Pt's wife in reference to Pt's nausea and vomiting. Spoke with wife who asked Pt how he was doing. Pt. On speaker phone stated that he was doing well this morning. No nausea and vomiting.

## 2020-02-27 ENCOUNTER — Other Ambulatory Visit: Payer: Self-pay | Admitting: Hematology

## 2020-02-28 ENCOUNTER — Other Ambulatory Visit: Payer: Self-pay

## 2020-02-28 ENCOUNTER — Ambulatory Visit (HOSPITAL_COMMUNITY)
Admission: RE | Admit: 2020-02-28 | Discharge: 2020-02-28 | Disposition: A | Discharge status: Home / Self Care | Payer: Medicare Other | Source: Ambulatory Visit | Attending: Oncology | Admitting: Oncology

## 2020-02-28 ENCOUNTER — Telehealth: Payer: Self-pay

## 2020-02-28 ENCOUNTER — Inpatient Hospital Stay: Payer: Medicare Other | Attending: Oncology

## 2020-02-28 DIAGNOSIS — Z79899 Other long term (current) drug therapy: Secondary | ICD-10-CM | POA: Insufficient documentation

## 2020-02-28 DIAGNOSIS — R630 Anorexia: Secondary | ICD-10-CM | POA: Diagnosis not present

## 2020-02-28 DIAGNOSIS — Z9221 Personal history of antineoplastic chemotherapy: Secondary | ICD-10-CM | POA: Insufficient documentation

## 2020-02-28 DIAGNOSIS — Z7984 Long term (current) use of oral hypoglycemic drugs: Secondary | ICD-10-CM | POA: Diagnosis not present

## 2020-02-28 DIAGNOSIS — Z85048 Personal history of other malignant neoplasm of rectum, rectosigmoid junction, and anus: Secondary | ICD-10-CM | POA: Diagnosis present

## 2020-02-28 DIAGNOSIS — C9110 Chronic lymphocytic leukemia of B-cell type not having achieved remission: Secondary | ICD-10-CM | POA: Diagnosis not present

## 2020-02-28 DIAGNOSIS — Z7982 Long term (current) use of aspirin: Secondary | ICD-10-CM | POA: Diagnosis not present

## 2020-02-28 DIAGNOSIS — D696 Thrombocytopenia, unspecified: Secondary | ICD-10-CM | POA: Insufficient documentation

## 2020-02-28 LAB — CMP (CANCER CENTER ONLY)
ALT: 10 U/L (ref 0–44)
AST: 11 U/L — ABNORMAL LOW (ref 15–41)
Albumin: 4.1 g/dL (ref 3.5–5.0)
Alkaline Phosphatase: 144 U/L — ABNORMAL HIGH (ref 38–126)
Anion gap: 13 (ref 5–15)
BUN: 24 mg/dL — ABNORMAL HIGH (ref 8–23)
CO2: 25 mmol/L (ref 22–32)
Calcium: 10.2 mg/dL (ref 8.9–10.3)
Chloride: 103 mmol/L (ref 98–111)
Creatinine: 2.17 mg/dL — ABNORMAL HIGH (ref 0.61–1.24)
GFR, Est AFR Am: 33 mL/min — ABNORMAL LOW (ref >60)
GFR, Estimated: 28 mL/min — ABNORMAL LOW (ref >60)
Glucose, Bld: 144 mg/dL — ABNORMAL HIGH (ref 70–99)
Potassium: 4.7 mmol/L (ref 3.5–5.1)
Sodium: 141 mmol/L (ref 135–145)
Total Bilirubin: 0.8 mg/dL (ref 0.3–1.2)
Total Protein: 6.8 g/dL (ref 6.5–8.1)

## 2020-02-28 LAB — CBC WITH DIFFERENTIAL (CANCER CENTER ONLY)
Abs Immature Granulocytes: 0.18 10*3/uL — ABNORMAL HIGH (ref 0.00–0.07)
Basophils Absolute: 0.1 10*3/uL (ref 0.0–0.1)
Basophils Relative: 0 %
Eosinophils Absolute: 0.1 10*3/uL (ref 0.0–0.5)
Eosinophils Relative: 0 %
HCT: 30.5 % — ABNORMAL LOW (ref 39.0–52.0)
Hemoglobin: 9.1 g/dL — ABNORMAL LOW (ref 13.0–17.0)
Immature Granulocytes: 0 %
Lymphocytes Relative: 96 %
Lymphs Abs: 126.8 10*3/uL — ABNORMAL HIGH (ref 0.7–4.0)
MCH: 30.3 pg (ref 26.0–34.0)
MCHC: 29.8 g/dL — ABNORMAL LOW (ref 30.0–36.0)
MCV: 101.7 fL — ABNORMAL HIGH (ref 80.0–100.0)
Monocytes Absolute: 2.1 10*3/uL — ABNORMAL HIGH (ref 0.1–1.0)
Monocytes Relative: 2 %
Neutro Abs: 2.6 10*3/uL (ref 1.7–7.7)
Neutrophils Relative %: 2 %
Platelet Count: 61 10*3/uL — ABNORMAL LOW (ref 150–400)
RBC: 3 MIL/uL — ABNORMAL LOW (ref 4.22–5.81)
RDW: 16.1 % — ABNORMAL HIGH (ref 11.5–15.5)
WBC Count: 131.8 10*3/uL (ref 4.0–10.5)
nRBC: 0 % (ref 0.0–0.2)

## 2020-02-28 NOTE — Telephone Encounter (Signed)
Received a call from Lelan Pons in the lab at 0926 to report WBC 131.8. Dr. Alen Blew made aware at 0930. No further instructions received at this time.

## 2020-02-29 NOTE — Telephone Encounter (Signed)
Dr. Alen Blew,  Dr. Burr Medico  prescribed this for your patient when she was on call.  Thanks  Tollie Pizza

## 2020-03-01 ENCOUNTER — Inpatient Hospital Stay (HOSPITAL_BASED_OUTPATIENT_CLINIC_OR_DEPARTMENT_OTHER): Payer: Medicare Other | Admitting: Oncology

## 2020-03-01 ENCOUNTER — Other Ambulatory Visit: Payer: Self-pay

## 2020-03-01 VITALS — BP 142/54 | HR 77 | Temp 97.7°F | Resp 18 | Ht 67.0 in | Wt 125.6 lb

## 2020-03-01 DIAGNOSIS — C9110 Chronic lymphocytic leukemia of B-cell type not having achieved remission: Secondary | ICD-10-CM | POA: Diagnosis not present

## 2020-03-01 NOTE — Progress Notes (Signed)
Hematology and Oncology Follow Up Visit  [name] [id number] [birth year] 79 y.o. 03/01/2020 2:53 PM [name], [name], MD   Principle Diagnosis: 79 year old man with CLL presented with lymphocytosis and adenopathy in 2013.  The developed relapsed disease in May of 2021.  Secondary diagnosis: T2N0 rectal cancer diagnosed in 2009 is currently in remission after surgical resection.    Prior Therapy: He is status post FCR chemotherapy completed on on April of 2013 and accomplished complete response.    Current therapy: Ibrutinib 420 mg daily started in May 2021.  Interim History: [name] is here for a follow-up visit.  Since the last visit, he was started on ibrutinib without any major complications related to it.  He denies any nausea, vomiting or abdominal pain.  He denies any lymphadenopathy or constitutional symptoms.  He continues to have vague symptoms of not feeling well and occasional fatigue.  Still able to drive and attends to activities of daily living.  His appetite has been poor in the last year and have lost weight.  He has reported due to change in the taste of certain foods but no dysphagia or odontophagia.         Medications: Reviewed without changes. Current Outpatient Medications  Medication Sig Dispense Refill  . amLODipine (NORVASC) 5 MG tablet Take 5 mg by mouth every morning.    Marland Kitchen aspirin 81 MG tablet Take 81 mg by mouth every morning.     Marland Kitchen atorvastatin (LIPITOR) 40 MG tablet Take 40 mg by mouth every evening.     . calcium carbonate (TUMS - DOSED IN MG ELEMENTAL CALCIUM) 500 MG chewable tablet Chew 1 tablet by mouth daily as needed for indigestion or heartburn.    . Ibrutinib 420 MG TABS Take 420 mg by mouth daily. 30 tablet 0  . lisinopril (ZESTRIL) 20 MG tablet Take 20 mg by mouth every morning.    . metFORMIN (GLUCOPHAGE) 1000 MG tablet Take 1,000 mg by mouth 2 (two) times daily with a meal.  0  . metoprolol tartrate (LOPRESSOR) 25 MG  tablet Take 0.5 tablets (12.5 mg total) by mouth 2 (two) times daily. 30 tablet 2  . Multiple Vitamin (MULTIVITAMIN WITH MINERALS) TABS tablet Take 1 tablet by mouth daily.    . ondansetron (ZOFRAN-ODT) 8 MG disintegrating tablet TAKE 1 TABLET (8 MG TOTAL) BY MOUTH EVERY 8 (EIGHT) HOURS AS NEEDED FOR NAUSEA OR VOMITING. 20 tablet 0  . pantoprazole (PROTONIX) 40 MG tablet Take 1 tablet (40 mg total) by mouth daily. (Patient not taking: Reported on 04/11/2019) 90 tablet 0  . prochlorperazine (COMPAZINE) 10 MG tablet TAKE 1 TABLET (10 MG TOTAL) BY MOUTH EVERY 6 (SIX) HOURS AS NEEDED FOR NAUSEA OR VOMITING. 30 tablet 1  . SHINGRIX injection     . traZODone (DESYREL) 50 MG tablet Take 1 tablet (50 mg total) by mouth at bedtime as needed for up to 60 doses for sleep. 30 tablet 1  . zolpidem (AMBIEN) 10 MG tablet Take 5-10 mg by mouth at bedtime as needed.     No current facility-administered medications for this visit.     Allergies:  Allergies  Allergen Reactions  . Hydrocodone-Acetaminophen Other (See Comments)    Loss of weight     Physical Exam:   Blood pressure (!) 142/54, pulse 77, temperature 97.7 F (36.5 C), temperature source Temporal, resp. rate 18, height 5\' 7"  (1.702 m), weight 125 lb 9.6 oz (57 kg), SpO2 100 %.    ECOG:  1    General appearance: Comfortable appearing without any discomfort Head: Normocephalic without any trauma Oropharynx: Mucous membranes are moist and pink without any thrush or ulcers. Eyes: Pupils are equal and round reactive to light. Lymph nodes: No cervical, supraclavicular, inguinal or axillary lymphadenopathy.   Heart:regular rate and rhythm.  S1 and S2 without leg edema. Lung: Clear without any rhonchi or wheezes.  No dullness to percussion. Abdomin: Soft, nontender, nondistended with good bowel sounds.  No hepatosplenomegaly. Musculoskeletal: No joint deformity or effusion.  Full range of motion noted. Neurological: No deficits noted on  motor, sensory and deep tendon reflex exam. Skin: No petechial rash or dryness.  Appeared moist.       Lab Results: Lab Results  Component Value Date   WBC 131.8 (HH) 02/28/2020   HGB 9.1 (L) 02/28/2020   HCT 30.5 (L) 02/28/2020   MCV 101.7 (H) 02/28/2020   PLT 61 (L) 02/28/2020     Chemistry      Component Value Date/Time   NA 141 02/28/2020 0913   NA 140 07/13/2017 1456   K 4.7 02/28/2020 0913   K 4.6 07/13/2017 1456   CL 103 02/28/2020 0913   CL 104 01/10/2013 1128   CO2 25 02/28/2020 0913   CO2 24 07/13/2017 1456   BUN 24 (H) 02/28/2020 0913   BUN 18.9 07/13/2017 1456   CREATININE 2.17 (H) 02/28/2020 0913   CREATININE 1.2 07/13/2017 1456      Component Value Date/Time   CALCIUM 10.2 02/28/2020 0913   CALCIUM 9.5 07/13/2017 1456   ALKPHOS 144 (H) 02/28/2020 0913   ALKPHOS 121 07/13/2017 1456   AST 11 (L) 02/28/2020 0913   AST 17 07/13/2017 1456   ALT 10 02/28/2020 0913   ALT 19 07/13/2017 1456   BILITOT 0.8 02/28/2020 0913   BILITOT 0.60 07/13/2017 1456       IMPRESSION: 1. Mild lymphadenopathy in left axilla and bilateral iliac chains shows mild decrease since prior study. 2. No significant change in mild right axillary and abdominal retroperitoneal and mesenteric lymphadenopathy. 3. Mild splenomegaly, increased since prior exam.  Aortic Atherosclerosis (ICD10-I70.0).       Impression and Plan:  79 year old man with  1.  Relapsed CLL documented in May 2021.  He was found to have lymphocytosis and adenopathy as well as worsening cytopenias.  He had has been started on ibrutinib since the last visit.  CT scan obtained on February 28, 2020 was personally reviewed and discussed with the patient.  He does have mild adenopathy without any bulky disease or organ involvement.  I have recommended continuing ibrutinib for the time being given the fact that he has tolerated reasonably well we will continue to monitor his counts and symptoms.   2.  T2N0  rectal cancer diagnosed 2009.  He has no evidence of disease relapse at this time and likely cured from his malignancy.  3. Thrombocytopenia: No active bleeding noted at this time.  His thrombocytopenia is related to CLL likely marrow involvement.    4.  Anorexia: We discussed strategies to boost his nutritional intake including increased nutritional supplements once a day to 3 times a day.  5. Followup: Over the next 3 to 4 weeks for repeat evaluation.  30  minutes were spent on this encounter.  The time was dedicated to reviewing imaging studies, addressing complications related to his condition and ongoing treatments.    [name], MD 6/4/20212:53 PM

## 2020-03-04 ENCOUNTER — Telehealth: Payer: Self-pay | Admitting: Oncology

## 2020-03-04 NOTE — Telephone Encounter (Signed)
Disregarded los and kept appts that were already scheduled for 6/24 as is, per MD

## 2020-03-11 ENCOUNTER — Other Ambulatory Visit: Payer: Self-pay

## 2020-03-11 MED ORDER — IBRUTINIB 420 MG PO TABS: 420.0000 mg | ORAL_TABLET | Freq: Every day | ORAL | 0 refills | Status: DC

## 2020-03-11 MED FILL — IMBRUVICA 420 MG TAB: 420 | 28 days supply | Qty: 28 | Fill #0

## 2020-03-14 ENCOUNTER — Other Ambulatory Visit: Payer: Self-pay | Admitting: Oncology

## 2020-03-19 ENCOUNTER — Other Ambulatory Visit: Payer: Self-pay

## 2020-03-19 ENCOUNTER — Telehealth: Payer: Self-pay

## 2020-03-19 DIAGNOSIS — C9110 Chronic lymphocytic leukemia of B-cell type not having achieved remission: Secondary | ICD-10-CM

## 2020-03-19 NOTE — Telephone Encounter (Signed)
Nutrition Assessment   Reason for Assessment: Patient identified on Malnutrition Screening report for weight loss   ASSESSMENT:  79 year old male with CLL currently on ibrutinib.  Followed by Dr Alen Blew.  Past medical history of rectal cancer in 2009, HTN, DM.    Spoke with patient via phone along with wife to introduce self and service at [facility].  Patient reports poor appetite for the last 6 months.  Reports eats lower volume of food than before.  Does report taste change.  Does eat cereal or cream of wheat with bacon and toast for breakfast.  Lunch is usually sandwich and dinner is meat and couple of side items.  Drinks 3 ensure plus per day between meals.  Wife reports patient eats toddler size portions.     Medications: reviewed   Labs: reviewed   Anthropometrics:   Height: 67 inches Weight: 125 lb on 6/4 135 lb July 2020 BMI: 19  7% weight loss in the last year   Estimated Energy Needs  Kcals: 1700-2000 Protein: 85-100 g Fluid: > 1.7 L   NUTRITION DIAGNOSIS: Inadequate oral intake related to cancer related treatment side effects as evidenced by 7% weight loss in the last year and decreased intake   INTERVENTION:  Encouraged patient to continue high calorie shake for added calories and protein. Will mail coupons. Discussed ways to add calories and protein in diet.  Will mail handout Contact information provided and encouraged patient and wife to call RD if has further questions or concerns.    Next Visit: no follow-up Patient to contact RD if needed  Joli B. Zenia Resides, [place], [place] Registered Dietitian [phone] (pager)

## 2020-03-21 ENCOUNTER — Other Ambulatory Visit: Payer: Self-pay

## 2020-03-21 ENCOUNTER — Inpatient Hospital Stay: Payer: Medicare Other

## 2020-03-21 ENCOUNTER — Inpatient Hospital Stay (HOSPITAL_BASED_OUTPATIENT_CLINIC_OR_DEPARTMENT_OTHER): Payer: Medicare Other | Admitting: Oncology

## 2020-03-21 VITALS — BP 124/53 | HR 64 | Temp 97.8°F | Resp 20 | Ht 67.0 in | Wt 127.0 lb

## 2020-03-21 DIAGNOSIS — C9110 Chronic lymphocytic leukemia of B-cell type not having achieved remission: Secondary | ICD-10-CM

## 2020-03-21 LAB — CBC WITH DIFFERENTIAL (CANCER CENTER ONLY)
Abs Immature Granulocytes: 0.21 10*3/uL — ABNORMAL HIGH (ref 0.00–0.07)
Basophils Absolute: 0 10*3/uL (ref 0.0–0.1)
Basophils Relative: 0 %
Eosinophils Absolute: 0.1 10*3/uL (ref 0.0–0.5)
Eosinophils Relative: 0 %
HCT: 31.1 % — ABNORMAL LOW (ref 39.0–52.0)
Hemoglobin: 9.1 g/dL — ABNORMAL LOW (ref 13.0–17.0)
Immature Granulocytes: 0 %
Lymphocytes Relative: 97 %
Lymphs Abs: 143.8 10*3/uL — ABNORMAL HIGH (ref 0.7–4.0)
MCH: 30.6 pg (ref 26.0–34.0)
MCHC: 29.3 g/dL — ABNORMAL LOW (ref 30.0–36.0)
MCV: 104.7 fL — ABNORMAL HIGH (ref 80.0–100.0)
Monocytes Absolute: 0.8 10*3/uL (ref 0.1–1.0)
Monocytes Relative: 1 %
Neutro Abs: 2.6 10*3/uL (ref 1.7–7.7)
Neutrophils Relative %: 2 %
Platelet Count: 90 10*3/uL — ABNORMAL LOW (ref 150–400)
RBC: 2.97 MIL/uL — ABNORMAL LOW (ref 4.22–5.81)
RDW: 15.4 % (ref 11.5–15.5)
WBC Count: 147.6 10*3/uL (ref 4.0–10.5)
nRBC: 0 % (ref 0.0–0.2)

## 2020-03-21 LAB — CMP (CANCER CENTER ONLY)
ALT: 11 U/L (ref 0–44)
AST: 14 U/L — ABNORMAL LOW (ref 15–41)
Albumin: 3.8 g/dL (ref 3.5–5.0)
Alkaline Phosphatase: 147 U/L — ABNORMAL HIGH (ref 38–126)
Anion gap: 10 (ref 5–15)
BUN: 24 mg/dL — ABNORMAL HIGH (ref 8–23)
CO2: 22 mmol/L (ref 22–32)
Calcium: 10 mg/dL (ref 8.9–10.3)
Chloride: 110 mmol/L (ref 98–111)
Creatinine: 1.84 mg/dL — ABNORMAL HIGH (ref 0.61–1.24)
GFR, Est AFR Am: 40 mL/min — ABNORMAL LOW (ref >60)
GFR, Estimated: 35 mL/min — ABNORMAL LOW (ref >60)
Glucose, Bld: 85 mg/dL (ref 70–99)
Potassium: 5 mmol/L (ref 3.5–5.1)
Sodium: 142 mmol/L (ref 135–145)
Total Bilirubin: 0.7 mg/dL (ref 0.3–1.2)
Total Protein: 6.6 g/dL (ref 6.5–8.1)

## 2020-03-21 NOTE — Progress Notes (Signed)
Hematology and Oncology Follow Up Visit  [name] [id number] 02/14/1941 79 y.o. 03/21/2020 3:53 PM [name], [name], MD   Principle Diagnosis: 79 year old man with relapsed CLL documented in May 2021. He was initially diagnosed in 2013 with lymphocytosis and adenopathy.  Secondary diagnosis: T2N0 rectal cancer diagnosed in 2009 is currently in remission after surgical resection.    Prior Therapy: He is status post FCR chemotherapy completed on on April of 2013 and accomplished complete response.    Current therapy: Ibrutinib 420 mg daily started in May 2021.  Interim History: [name] presents today for a follow-up evaluation. Since the last visit, he reports no major changes in his health.  He continues to tolerate ibrutinib without any major complaints.  He does report some fatigue tiredness but no bleeding or palpitation.  He denies any nausea, vomiting or abdominal pain.  He denies any lymphadenopathy or petechiae.  He is trying to eat better and has gained 2 more pounds         Medications: Updated on review. Current Outpatient Medications  Medication Sig Dispense Refill  . amLODipine (NORVASC) 5 MG tablet Take 5 mg by mouth every morning.    Marland Kitchen aspirin 81 MG tablet Take 81 mg by mouth every morning.     Marland Kitchen atorvastatin (LIPITOR) 40 MG tablet Take 40 mg by mouth every evening.     . calcium carbonate (TUMS - DOSED IN MG ELEMENTAL CALCIUM) 500 MG chewable tablet Chew 1 tablet by mouth daily as needed for indigestion or heartburn.    . Ibrutinib 420 MG TABS Take 420 mg by mouth daily. 30 tablet 0  . lisinopril (ZESTRIL) 20 MG tablet Take 20 mg by mouth every morning.    . metFORMIN (GLUCOPHAGE) 1000 MG tablet Take 1,000 mg by mouth 2 (two) times daily with a meal.  0  . metoprolol tartrate (LOPRESSOR) 25 MG tablet Take 0.5 tablets (12.5 mg total) by mouth 2 (two) times daily. 30 tablet 2  . Multiple Vitamin (MULTIVITAMIN WITH MINERALS) TABS tablet Take 1  tablet by mouth daily.    . ondansetron (ZOFRAN-ODT) 8 MG disintegrating tablet TAKE 1 TABLET (8 MG TOTAL) BY MOUTH EVERY 8 (EIGHT) HOURS AS NEEDED FOR NAUSEA OR VOMITING. 20 tablet 0  . pantoprazole (PROTONIX) 40 MG tablet Take 1 tablet (40 mg total) by mouth daily. (Patient not taking: Reported on 04/11/2019) 90 tablet 0  . prochlorperazine (COMPAZINE) 10 MG tablet TAKE 1 TABLET (10 MG TOTAL) BY MOUTH EVERY 6 (SIX) HOURS AS NEEDED FOR NAUSEA OR VOMITING. 30 tablet 1  . SHINGRIX injection     . traZODone (DESYREL) 50 MG tablet Take 1 tablet (50 mg total) by mouth at bedtime as needed for up to 60 doses for sleep. 30 tablet 1  . zolpidem (AMBIEN) 10 MG tablet Take 5-10 mg by mouth at bedtime as needed.     No current facility-administered medications for this visit.     Allergies:  Allergies  Allergen Reactions  . Hydrocodone-Acetaminophen Other (See Comments)    Loss of weight     Physical Exam:   Blood pressure (!) 124/53, pulse 64, temperature 97.8 F (36.6 C), temperature source Temporal, resp. rate 20, height 5\' 7"  (1.702 m), weight 127 lb (57.6 kg), SpO2 100 %.     ECOG: 1   General appearance: Alert, awake without any distress. Head: Atraumatic without abnormalities Oropharynx: Without any thrush or ulcers. Eyes: No scleral icterus. Lymph nodes: No lymphadenopathy noted in the  cervical, supraclavicular, or axillary nodes Heart:regular rate and rhythm, without any murmurs or gallops.   Lung: Clear to auscultation without any rhonchi, wheezes or dullness to percussion. Abdomin: Soft, nontender without any shifting dullness or ascites. Musculoskeletal: No clubbing or cyanosis. Neurological: No motor or sensory deficits. Skin: No rashes or lesions.       Lab Results: Lab Results  Component Value Date   WBC 131.8 (HH) 02/28/2020   HGB 9.1 (L) 02/28/2020   HCT 30.5 (L) 02/28/2020   MCV 101.7 (H) 02/28/2020   PLT 61 (L) 02/28/2020     Chemistry       Component Value Date/Time   NA 141 02/28/2020 0913   NA 140 07/13/2017 1456   K 4.7 02/28/2020 0913   K 4.6 07/13/2017 1456   CL 103 02/28/2020 0913   CL 104 01/10/2013 1128   CO2 25 02/28/2020 0913   CO2 24 07/13/2017 1456   BUN 24 (H) 02/28/2020 0913   BUN 18.9 07/13/2017 1456   CREATININE 2.17 (H) 02/28/2020 0913   CREATININE 1.2 07/13/2017 1456      Component Value Date/Time   CALCIUM 10.2 02/28/2020 0913   CALCIUM 9.5 07/13/2017 1456   ALKPHOS 144 (H) 02/28/2020 0913   ALKPHOS 121 07/13/2017 1456   AST 11 (L) 02/28/2020 0913   AST 17 07/13/2017 1456   ALT 10 02/28/2020 0913   ALT 19 07/13/2017 1456   BILITOT 0.8 02/28/2020 0913   BILITOT 0.60 07/13/2017 1456             Impression and Plan:  79 year old man with  1. CLL diagnosed in 2013. He developed relapsed disease in May 2021 with a lymphocytosis and cytopenia.  He is currently on ibrutinib without any major complications. The natural course of this disease and treatment choices moving forward were reviewed. Continuing ibrutinib for the time being versus switching to systemic chemotherapy were reiterated.  He is willing to continue for the time being.  Laboratory data reviewed today and showed overall stable white cell count which is predictable at this time with improvement.  His lymphocyte percentage is improving.   2. Rectal cancer diagnosed in 2009: He was found to have T2N0 disease without any evidence of relapse.  3. Thrombocytopenia: Related to CLL without any active bleeding. I anticipate improvement as his disease recovers.  His platelet count already recovering at this time.  No active bleeding noted.   4.  Anorexia: His appetite has improved and continues to try to eat better.  5. Followup: Will be in the next 4 to 6 weeks for repeat follow-up.  30  minutes were dedicated to this visit. The time was spent on updating his disease status, reviewing laboratory data, discussing treatment  options and addressing complications related to his therapy.   [name], MD 6/24/20213:53 PM

## 2020-03-21 NOTE — Progress Notes (Unsigned)
Reported critical WBC 147.6 to Dr. Alen Blew.

## 2020-03-22 MED ORDER — CYANOCOBALAMIN 1000 MCG/ML IJ SOLN
INTRAMUSCULAR | Status: AC
  Filled 2020-03-22: qty 1

## 2020-04-08 ENCOUNTER — Other Ambulatory Visit: Payer: Self-pay | Admitting: Oncology

## 2020-04-08 MED FILL — IMBRUVICA 420 MG TAB: 420 | 28 days supply | Qty: 28 | Fill #0

## 2020-04-10 ENCOUNTER — Other Ambulatory Visit: Payer: Self-pay | Admitting: Oncology

## 2020-04-20 ENCOUNTER — Other Ambulatory Visit: Payer: Self-pay | Admitting: Oncology

## 2020-05-01 ENCOUNTER — Ambulatory Visit: Payer: Medicare Other | Admitting: Oncology

## 2020-05-01 ENCOUNTER — Other Ambulatory Visit: Payer: Medicare Other

## 2020-05-06 ENCOUNTER — Other Ambulatory Visit: Payer: Self-pay | Admitting: Oncology

## 2020-05-07 ENCOUNTER — Other Ambulatory Visit: Payer: Self-pay

## 2020-05-07 ENCOUNTER — Inpatient Hospital Stay (HOSPITAL_BASED_OUTPATIENT_CLINIC_OR_DEPARTMENT_OTHER): Payer: Medicare Other | Admitting: Oncology

## 2020-05-07 ENCOUNTER — Inpatient Hospital Stay: Payer: Medicare Other | Attending: Oncology

## 2020-05-07 ENCOUNTER — Telehealth: Payer: Self-pay

## 2020-05-07 VITALS — BP 120/53 | HR 81 | Temp 97.8°F | Resp 18 | Ht 67.0 in | Wt 126.1 lb

## 2020-05-07 DIAGNOSIS — Z9221 Personal history of antineoplastic chemotherapy: Secondary | ICD-10-CM | POA: Insufficient documentation

## 2020-05-07 DIAGNOSIS — C9112 Chronic lymphocytic leukemia of B-cell type in relapse: Secondary | ICD-10-CM | POA: Diagnosis not present

## 2020-05-07 DIAGNOSIS — Z7984 Long term (current) use of oral hypoglycemic drugs: Secondary | ICD-10-CM | POA: Insufficient documentation

## 2020-05-07 DIAGNOSIS — D649 Anemia, unspecified: Secondary | ICD-10-CM | POA: Insufficient documentation

## 2020-05-07 DIAGNOSIS — C9110 Chronic lymphocytic leukemia of B-cell type not having achieved remission: Secondary | ICD-10-CM

## 2020-05-07 DIAGNOSIS — Z7982 Long term (current) use of aspirin: Secondary | ICD-10-CM | POA: Insufficient documentation

## 2020-05-07 DIAGNOSIS — Z85048 Personal history of other malignant neoplasm of rectum, rectosigmoid junction, and anus: Secondary | ICD-10-CM | POA: Diagnosis not present

## 2020-05-07 DIAGNOSIS — Z79899 Other long term (current) drug therapy: Secondary | ICD-10-CM | POA: Insufficient documentation

## 2020-05-07 LAB — CMP (CANCER CENTER ONLY)
ALT: 6 U/L (ref 0–44)
AST: 13 U/L — ABNORMAL LOW (ref 15–41)
Albumin: 3.5 g/dL (ref 3.5–5.0)
Alkaline Phosphatase: 129 U/L — ABNORMAL HIGH (ref 38–126)
Anion gap: 9 (ref 5–15)
BUN: 31 mg/dL — ABNORMAL HIGH (ref 8–23)
CO2: 23 mmol/L (ref 22–32)
Calcium: 9.6 mg/dL (ref 8.9–10.3)
Chloride: 108 mmol/L (ref 98–111)
Creatinine: 2.19 mg/dL — ABNORMAL HIGH (ref 0.61–1.24)
GFR, Est AFR Am: 32 mL/min — ABNORMAL LOW (ref >60)
GFR, Estimated: 28 mL/min — ABNORMAL LOW (ref >60)
Glucose, Bld: 200 mg/dL — ABNORMAL HIGH (ref 70–99)
Potassium: 4.7 mmol/L (ref 3.5–5.1)
Sodium: 140 mmol/L (ref 135–145)
Total Bilirubin: 0.6 mg/dL (ref 0.3–1.2)
Total Protein: 6.2 g/dL — ABNORMAL LOW (ref 6.5–8.1)

## 2020-05-07 LAB — CBC WITH DIFFERENTIAL (CANCER CENTER ONLY)
Abs Immature Granulocytes: 0.2 10*3/uL — ABNORMAL HIGH (ref 0.00–0.07)
Basophils Absolute: 0 10*3/uL (ref 0.0–0.1)
Basophils Relative: 0 %
Eosinophils Absolute: 0.1 10*3/uL (ref 0.0–0.5)
Eosinophils Relative: 0 %
HCT: 30.6 % — ABNORMAL LOW (ref 39.0–52.0)
Hemoglobin: 9 g/dL — ABNORMAL LOW (ref 13.0–17.0)
Immature Granulocytes: 0 %
Lymphocytes Relative: 98 %
Lymphs Abs: 106.7 10*3/uL — ABNORMAL HIGH (ref 0.7–4.0)
MCH: 30.3 pg (ref 26.0–34.0)
MCHC: 29.4 g/dL — ABNORMAL LOW (ref 30.0–36.0)
MCV: 103 fL — ABNORMAL HIGH (ref 80.0–100.0)
Monocytes Absolute: 0.5 10*3/uL (ref 0.1–1.0)
Monocytes Relative: 0 %
Neutro Abs: 1.8 10*3/uL (ref 1.7–7.7)
Neutrophils Relative %: 2 %
Platelet Count: 91 10*3/uL — ABNORMAL LOW (ref 150–400)
RBC: 2.97 MIL/uL — ABNORMAL LOW (ref 4.22–5.81)
RDW: 13.9 % (ref 11.5–15.5)
WBC Count: 109.3 10*3/uL (ref 4.0–10.5)
nRBC: 0 % (ref 0.0–0.2)

## 2020-05-07 MED FILL — IMBRUVICA 420 MG TAB: 420 | 28 days supply | Qty: 28 | Fill #0

## 2020-05-07 NOTE — Progress Notes (Signed)
Hematology and Oncology Follow Up Visit  [name] [id number] [birth year] 79 y.o. 05/07/2020 3:05 PM [name], [name], MD   Principle Diagnosis: 79 year old man with CLL diagnosed in 2013.  He developed relapsed disease in May 2021.  Secondary diagnosis: T2N0 rectal cancer diagnosed in 2009 is currently in remission after surgical resection.    Prior Therapy: He is status post FCR chemotherapy completed on on April of 2013 and accomplished complete response.    Current therapy: Ibrutinib 420 mg daily started in May 2021.  Interim History: [name] returns today for a follow-up evaluation.  Since the last visit, he reports no major changes in his health.  He has tolerated ibrutinib without any major complications including no fatigue, tiredness or palpitation.  He denies any nausea, vomiting or abdominal pain.  He denies any decline in his appetite.  He denies lymphadenopathy or bruising.         Medications: Unchanged on review. Current Outpatient Medications  Medication Sig Dispense Refill  . amLODipine (NORVASC) 5 MG tablet Take 5 mg by mouth every morning.    Marland Kitchen aspirin 81 MG tablet Take 81 mg by mouth every morning.     Marland Kitchen atorvastatin (LIPITOR) 40 MG tablet Take 40 mg by mouth every evening.     . calcium carbonate (TUMS - DOSED IN MG ELEMENTAL CALCIUM) 500 MG chewable tablet Chew 1 tablet by mouth daily as needed for indigestion or heartburn.    . IMBRUVICA 420 MG TABS TAKE 1 TABLET BY MOUTH DAILY 28 tablet 0  . lisinopril (ZESTRIL) 20 MG tablet Take 20 mg by mouth every morning.    . metFORMIN (GLUCOPHAGE) 1000 MG tablet Take 1,000 mg by mouth 2 (two) times daily with a meal.  0  . metoprolol tartrate (LOPRESSOR) 25 MG tablet Take 0.5 tablets (12.5 mg total) by mouth 2 (two) times daily. 30 tablet 2  . Multiple Vitamin (MULTIVITAMIN WITH MINERALS) TABS tablet Take 1 tablet by mouth daily.    . ondansetron (ZOFRAN-ODT) 8 MG disintegrating tablet TAKE 1  TABLET (8 MG TOTAL) BY MOUTH EVERY 8 (EIGHT) HOURS AS NEEDED FOR NAUSEA OR VOMITING. 20 tablet 0  . pantoprazole (PROTONIX) 40 MG tablet Take 1 tablet (40 mg total) by mouth daily. (Patient not taking: Reported on 04/11/2019) 90 tablet 0  . prochlorperazine (COMPAZINE) 10 MG tablet TAKE 1 TABLET (10 MG TOTAL) BY MOUTH EVERY 6 (SIX) HOURS AS NEEDED FOR NAUSEA OR VOMITING. 30 tablet 1  . SHINGRIX injection     . traZODone (DESYREL) 50 MG tablet Take 1 tablet (50 mg total) by mouth at bedtime as needed for up to 60 doses for sleep. 30 tablet 1  . zolpidem (AMBIEN) 10 MG tablet Take 5-10 mg by mouth at bedtime as needed.     No current facility-administered medications for this visit.     Allergies:  Allergies  Allergen Reactions  . Hydrocodone-Acetaminophen Other (See Comments)    Loss of weight     Physical Exam:   Blood pressure (!) 120/53, pulse 81, temperature 97.8 F (36.6 C), temperature source Tympanic, resp. rate 18, height 5\' 7"  (1.702 m), weight 126 lb 1.6 oz (57.2 kg), SpO2 100 %.     ECOG: 1    General appearance: Comfortable appearing without any discomfort Head: Normocephalic without any trauma Oropharynx: Mucous membranes are moist and pink without any thrush or ulcers. Eyes: Pupils are equal and round reactive to light. Lymph nodes: No cervical, supraclavicular, inguinal or axillary  lymphadenopathy.   Heart:regular rate and rhythm.  S1 and S2 without leg edema. Lung: Clear without any rhonchi or wheezes.  No dullness to percussion. Abdomin: Soft, nontender, nondistended with good bowel sounds.  No hepatosplenomegaly. Musculoskeletal: No joint deformity or effusion.  Full range of motion noted. Neurological: No deficits noted on motor, sensory and deep tendon reflex exam. Skin: No petechial rash or dryness.  Appeared moist.         Lab Results: Lab Results  Component Value Date   WBC 109.3 (HH) 05/07/2020   HGB 9.0 (L) 05/07/2020   HCT 30.6 (L)  05/07/2020   MCV 103.0 (H) 05/07/2020   PLT 91 (L) 05/07/2020     Chemistry      Component Value Date/Time   NA 140 05/07/2020 1428   NA 140 07/13/2017 1456   K 4.7 05/07/2020 1428   K 4.6 07/13/2017 1456   CL 108 05/07/2020 1428   CL 104 01/10/2013 1128   CO2 23 05/07/2020 1428   CO2 24 07/13/2017 1456   BUN 31 (H) 05/07/2020 1428   BUN 18.9 07/13/2017 1456   CREATININE 2.19 (H) 05/07/2020 1428   CREATININE 1.2 07/13/2017 1456      Component Value Date/Time   CALCIUM 9.6 05/07/2020 1428   CALCIUM 9.5 07/13/2017 1456   ALKPHOS 129 (H) 05/07/2020 1428   ALKPHOS 121 07/13/2017 1456   AST 13 (L) 05/07/2020 1428   AST 17 07/13/2017 1456   ALT 6 05/07/2020 1428   ALT 19 07/13/2017 1456   BILITOT 0.6 05/07/2020 1428   BILITOT 0.60 07/13/2017 1456             Impression and Plan:  79 year old man with  1.  Relapsed CLL noted in May 2021 after presenting with lymphocytosis, anemia and thrombocytopenia.  He continues to tolerate ibrutinib without any major complications.  He is laboratory data from today reviewed and showed appropriate response and decline is white cell count without any complications related to this medication.  Hemoglobin continues to be stable and improvement in his thrombocytopenia.  Risks and benefits of continuing this treatments were reviewed today and he is agreeable to continue.  The plan is to continue the same dose and schedule at this time.   2. Rectal cancer diagnosed in 2009: No evidence of relapse's time after surgical resection..  3. Thrombocytopenia: Continues to improve over the treatment of CLL at this time.  No major changes needed.   4.  Anorexia: Weight is stable at this time I recommended continued nutritional supplements to maintain his weight.  5. Followup: In 8 weeks for return evaluation.  30  minutes were spent on this encounter.  The time was dedicated to updating his disease status, discussing treatment options and  future plan of care review.   [name], MD 8/10/20213:05 PM

## 2020-05-07 NOTE — Telephone Encounter (Signed)
Lab called with WBC 109.3. Dr. Alen Blew made aware. No new orders at this time.

## 2020-05-13 ENCOUNTER — Telehealth: Payer: Self-pay | Admitting: Oncology

## 2020-05-13 IMAGING — DX PORTABLE CHEST - 1 VIEW
1 series · 1 of 1 positions shown · non-contrast
Comparison: 09/17/2013

CLINICAL DATA: Fatigue, cough, weakness

EXAM:
PORTABLE CHEST 1 VIEW

[chest ap]
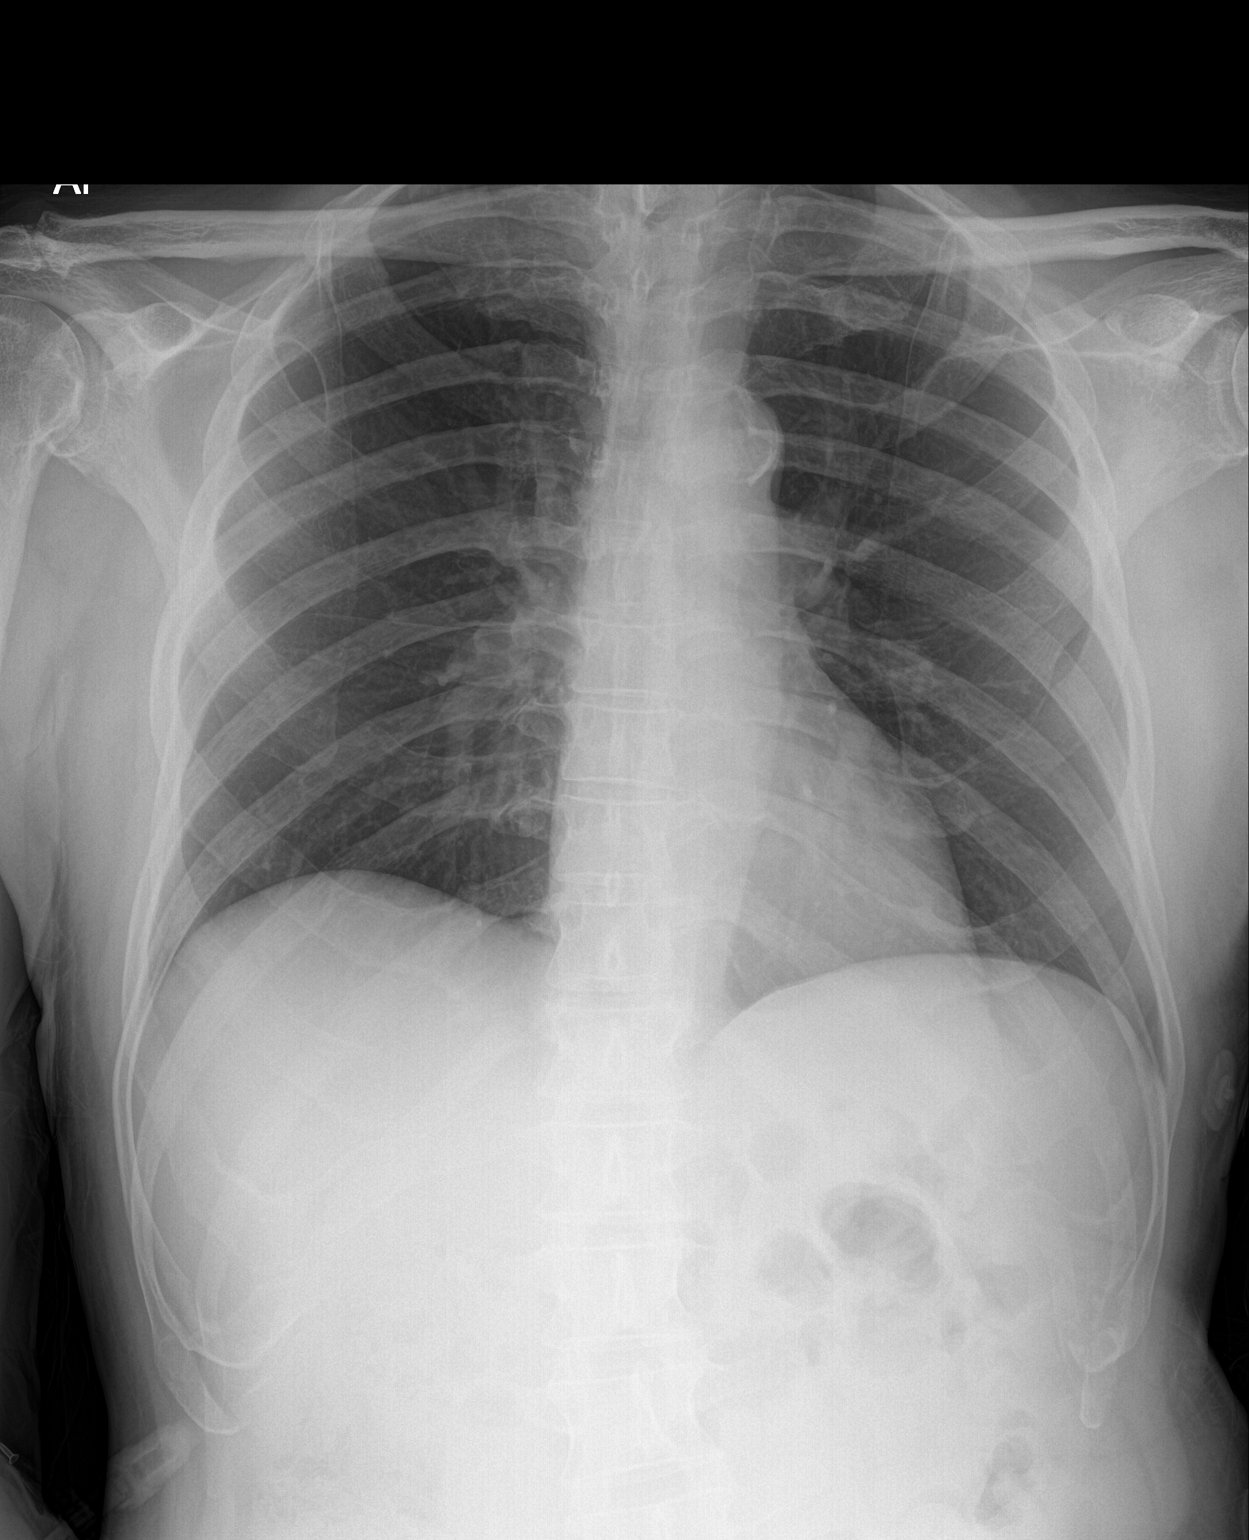

[1 of 1 positions shown; findings below may reference images not displayed]

FINDINGS: The heart size and mediastinal contours are within normal limits.
Both lungs are clear. The visualized skeletal structures are
unremarkable.

Trachea is midline. Aorta atherosclerotic. No acute osseous finding.
IMPRESSION: No active disease.

## 2020-05-13 NOTE — Telephone Encounter (Signed)
Scheduled per 08/10 los, patient has been called and notified. 

## 2020-05-14 ENCOUNTER — Other Ambulatory Visit: Payer: Self-pay | Admitting: Oncology

## 2020-05-21 DIAGNOSIS — Z23 Encounter for immunization: Secondary | ICD-10-CM | POA: Diagnosis not present

## 2020-05-23 ENCOUNTER — Other Ambulatory Visit: Payer: Medicare Other

## 2020-05-23 ENCOUNTER — Ambulatory Visit: Payer: Medicare Other | Admitting: Oncology

## 2020-05-29 ENCOUNTER — Other Ambulatory Visit: Payer: Self-pay | Admitting: Oncology

## 2020-05-31 ENCOUNTER — Other Ambulatory Visit: Payer: Self-pay | Admitting: Oncology

## 2020-06-04 MED FILL — IMBRUVICA 420 MG TAB: 420 | 28 days supply | Qty: 28 | Fill #0

## 2020-06-30 ENCOUNTER — Other Ambulatory Visit: Payer: Self-pay | Admitting: Oncology

## 2020-07-01 ENCOUNTER — Other Ambulatory Visit: Payer: Self-pay | Admitting: Oncology

## 2020-07-02 MED FILL — IMBRUVICA 420 MG TAB: 420 | 28 days supply | Qty: 28 | Fill #0

## 2020-07-10 ENCOUNTER — Inpatient Hospital Stay: Payer: Medicare Other | Attending: Oncology

## 2020-07-10 ENCOUNTER — Inpatient Hospital Stay (HOSPITAL_BASED_OUTPATIENT_CLINIC_OR_DEPARTMENT_OTHER): Payer: Medicare Other | Admitting: Oncology

## 2020-07-10 ENCOUNTER — Telehealth: Payer: Self-pay

## 2020-07-10 ENCOUNTER — Other Ambulatory Visit: Payer: Self-pay

## 2020-07-10 VITALS — BP 136/50 | HR 88 | Temp 97.3°F | Resp 18 | Wt 124.7 lb

## 2020-07-10 DIAGNOSIS — Z7982 Long term (current) use of aspirin: Secondary | ICD-10-CM | POA: Insufficient documentation

## 2020-07-10 DIAGNOSIS — Z9221 Personal history of antineoplastic chemotherapy: Secondary | ICD-10-CM | POA: Insufficient documentation

## 2020-07-10 DIAGNOSIS — C9112 Chronic lymphocytic leukemia of B-cell type in relapse: Secondary | ICD-10-CM | POA: Diagnosis not present

## 2020-07-10 DIAGNOSIS — R630 Anorexia: Secondary | ICD-10-CM | POA: Insufficient documentation

## 2020-07-10 DIAGNOSIS — Z85048 Personal history of other malignant neoplasm of rectum, rectosigmoid junction, and anus: Secondary | ICD-10-CM | POA: Diagnosis not present

## 2020-07-10 DIAGNOSIS — Z7984 Long term (current) use of oral hypoglycemic drugs: Secondary | ICD-10-CM | POA: Insufficient documentation

## 2020-07-10 DIAGNOSIS — Z79899 Other long term (current) drug therapy: Secondary | ICD-10-CM | POA: Insufficient documentation

## 2020-07-10 DIAGNOSIS — C9110 Chronic lymphocytic leukemia of B-cell type not having achieved remission: Secondary | ICD-10-CM

## 2020-07-10 DIAGNOSIS — D6959 Other secondary thrombocytopenia: Secondary | ICD-10-CM | POA: Insufficient documentation

## 2020-07-10 LAB — CBC WITH DIFFERENTIAL (CANCER CENTER ONLY)
Abs Immature Granulocytes: 0.31 10*3/uL — ABNORMAL HIGH (ref 0.00–0.07)
Basophils Absolute: 0 10*3/uL (ref 0.0–0.1)
Basophils Relative: 0 %
Eosinophils Absolute: 0.2 10*3/uL (ref 0.0–0.5)
Eosinophils Relative: 0 %
HCT: 34.4 % — ABNORMAL LOW (ref 39.0–52.0)
Hemoglobin: 10 g/dL — ABNORMAL LOW (ref 13.0–17.0)
Immature Granulocytes: 0 %
Lymphocytes Relative: 95 %
Lymphs Abs: 126.8 10*3/uL — ABNORMAL HIGH (ref 0.7–4.0)
MCH: 28.1 pg (ref 26.0–34.0)
MCHC: 29.1 g/dL — ABNORMAL LOW (ref 30.0–36.0)
MCV: 96.6 fL (ref 80.0–100.0)
Monocytes Absolute: 0.9 10*3/uL (ref 0.1–1.0)
Monocytes Relative: 1 %
Neutro Abs: 5 10*3/uL (ref 1.7–7.7)
Neutrophils Relative %: 4 %
Platelet Count: 113 10*3/uL — ABNORMAL LOW (ref 150–400)
RBC: 3.56 MIL/uL — ABNORMAL LOW (ref 4.22–5.81)
RDW: 14.2 % (ref 11.5–15.5)
WBC Count: 133.3 10*3/uL (ref 4.0–10.5)
nRBC: 0 % (ref 0.0–0.2)

## 2020-07-10 LAB — CMP (CANCER CENTER ONLY)
ALT: 10 U/L (ref 0–44)
AST: 14 U/L — ABNORMAL LOW (ref 15–41)
Albumin: 3.6 g/dL (ref 3.5–5.0)
Alkaline Phosphatase: 139 U/L — ABNORMAL HIGH (ref 38–126)
Anion gap: 8 (ref 5–15)
BUN: 24 mg/dL — ABNORMAL HIGH (ref 8–23)
CO2: 27 mmol/L (ref 22–32)
Calcium: 9.6 mg/dL (ref 8.9–10.3)
Chloride: 106 mmol/L (ref 98–111)
Creatinine: 1.76 mg/dL — ABNORMAL HIGH (ref 0.61–1.24)
GFR, Estimated: 36 mL/min — ABNORMAL LOW (ref >60)
Glucose, Bld: 148 mg/dL — ABNORMAL HIGH (ref 70–99)
Potassium: 4.1 mmol/L (ref 3.5–5.1)
Sodium: 141 mmol/L (ref 135–145)
Total Bilirubin: 0.7 mg/dL (ref 0.3–1.2)
Total Protein: 6.4 g/dL — ABNORMAL LOW (ref 6.5–8.1)

## 2020-07-10 NOTE — Progress Notes (Signed)
Hematology and Oncology Follow Up Visit  [name] [id number] 1941-01-08 79 y.o. 07/10/2020 2:37 PM [name], MDReade, [name], MD   Principle Diagnosis: 79 year old man with relapsed CLL noted in May 2021 with pancytopenia, lymphocytosis after he was initially diagnosed in 2013.  Secondary diagnosis: T2N0 rectal cancer diagnosed in 2009 is currently in remission after surgical resection.    Prior Therapy: He is status post FCR chemotherapy completed on on April of 2013 and accomplished complete response.    Current therapy: Ibrutinib 420 mg daily started in May 2021.  Interim History: Mr. [name] is here for a follow-up visit.  Since the last visit, he reports no major changes in his health.  He continues to tolerate ibrutinib without any major complaints.  He denies any nausea, vomiting or abdominal pain.  He denies any hematochezia or melena.  He denies any easy bruising or palpitation.         Medications: Reviewed without changes. Current Outpatient Medications  Medication Sig Dispense Refill  . amLODipine (NORVASC) 5 MG tablet Take 5 mg by mouth every morning.    Marland Kitchen aspirin 81 MG tablet Take 81 mg by mouth every morning.     Marland Kitchen atorvastatin (LIPITOR) 40 MG tablet Take 40 mg by mouth every evening.     . calcium carbonate (TUMS - DOSED IN MG ELEMENTAL CALCIUM) 500 MG chewable tablet Chew 1 tablet by mouth daily as needed for indigestion or heartburn.    . IMBRUVICA 420 MG TABS TAKE 1 TABLET BY MOUTH DAILY 28 tablet 0  . lisinopril (ZESTRIL) 20 MG tablet Take 20 mg by mouth every morning.    . metFORMIN (GLUCOPHAGE) 1000 MG tablet Take 1,000 mg by mouth 2 (two) times daily with a meal.  0  . metoprolol tartrate (LOPRESSOR) 25 MG tablet Take 0.5 tablets (12.5 mg total) by mouth 2 (two) times daily. 30 tablet 2  . Multiple Vitamin (MULTIVITAMIN WITH MINERALS) TABS tablet Take 1 tablet by mouth daily.    . ondansetron (ZOFRAN-ODT) 8 MG disintegrating tablet TAKE 1 TABLET  (8 MG TOTAL) BY MOUTH EVERY 8 (EIGHT) HOURS AS NEEDED FOR NAUSEA OR VOMITING. 20 tablet 0  . pantoprazole (PROTONIX) 40 MG tablet Take 1 tablet (40 mg total) by mouth daily. (Patient not taking: Reported on 04/11/2019) 90 tablet 0  . prochlorperazine (COMPAZINE) 10 MG tablet TAKE 1 TABLET (10 MG TOTAL) BY MOUTH EVERY 6 (SIX) HOURS AS NEEDED FOR NAUSEA OR VOMITING. 30 tablet 1  . SHINGRIX injection     . traZODone (DESYREL) 50 MG tablet Take 1 tablet (50 mg total) by mouth at bedtime as needed for up to 60 doses for sleep. 30 tablet 1  . zolpidem (AMBIEN) 10 MG tablet Take 5-10 mg by mouth at bedtime as needed.     No current facility-administered medications for this visit.     Allergies:  Allergies  Allergen Reactions  . Hydrocodone-Acetaminophen Other (See Comments)    Loss of weight     Physical Exam:   Blood pressure (!) 136/50, pulse 88, temperature (!) 97.3 F (36.3 C), temperature source Tympanic, resp. rate 18, weight 124 lb 11.2 oz (56.6 kg), SpO2 100 %.      ECOG: 1     General appearance: Alert, awake without any distress. Head: Atraumatic without abnormalities Oropharynx: Without any thrush or ulcers. Eyes: No scleral icterus. Lymph nodes: No lymphadenopathy noted in the cervical, supraclavicular, or axillary nodes Heart:regular rate and rhythm, without any murmurs or gallops.  Lung: Clear to auscultation without any rhonchi, wheezes or dullness to percussion. Abdomin: Soft, nontender without any shifting dullness or ascites. Musculoskeletal: No clubbing or cyanosis. Neurological: No motor or sensory deficits. Skin: No rashes or lesions.        Lab Results: Lab Results  Component Value Date   WBC 109.3 (HH) 05/07/2020   HGB 9.0 (L) 05/07/2020   HCT 30.6 (L) 05/07/2020   MCV 103.0 (H) 05/07/2020   PLT 91 (L) 05/07/2020     Chemistry      Component Value Date/Time   NA 140 05/07/2020 1428   NA 140 07/13/2017 1456   K 4.7 05/07/2020 1428   K  4.6 07/13/2017 1456   CL 108 05/07/2020 1428   CL 104 01/10/2013 1128   CO2 23 05/07/2020 1428   CO2 24 07/13/2017 1456   BUN 31 (H) 05/07/2020 1428   BUN 18.9 07/13/2017 1456   CREATININE 2.19 (H) 05/07/2020 1428   CREATININE 1.2 07/13/2017 1456      Component Value Date/Time   CALCIUM 9.6 05/07/2020 1428   CALCIUM 9.5 07/13/2017 1456   ALKPHOS 129 (H) 05/07/2020 1428   ALKPHOS 121 07/13/2017 1456   AST 13 (L) 05/07/2020 1428   AST 17 07/13/2017 1456   ALT 6 05/07/2020 1428   ALT 19 07/13/2017 1456   BILITOT 0.6 05/07/2020 1428   BILITOT 0.60 07/13/2017 1456             Impression and Plan:  79 year old man with  1.  CLL diagnosed in 2013 subsequently developed relapsed disease with lymphocytosis and thrombocytopenia diagnosed in May 2021.   The natural course of his disease was updated at this time and treatment options were reviewed.  He is currently on ibrutinib which she has tolerated very well without any complaints.  Risks and benefits of continuing this treatment were discussed.  Complications that include palpitation, bleeding and cardiovascular issues were reviewed.  Laboratory data from today although showed increase in his white cell count his hemoglobin continues to improve as well as his platelets.  I recommended continuing the same dose and schedule.   2. Rectal cancer continues to be in remission after initially diagnosed in 2009: He is currently on active surveillance without any signs symptoms of recurrence.   3. Thrombocytopenia: Related to relapsed CLL and currently improving with the current treatments.  No active bleeding noted.   4.  Anorexia: Weight has been stable and his appetite has been unchanged at this time.  We will continue to emphasize importance of adequate nutrition and maintaining nutritional supplements as needed.  5. Followup: In 2 months for repeat visit.  30  minutes were dedicated to this encounter.  Time was spent on  reviewing laboratory data, discussing disease treatment options and addressing complications noted therapy.   [name], MD 10/13/20212:37 PM

## 2020-07-10 NOTE — Telephone Encounter (Signed)
CRITICAL VALUE STICKER  CRITICAL VALUE: WBC 133.3  RECEIVER (on-site recipient of call): Wendall Mola, RN  [place] NOTIFIED: 07/10/2020 @ 1457  MESSENGER (representative from lab): R. Owens Shark  MD NOTIFIED: Dr. [name]  TIME OF NOTIFICATION: 5831  RESPONSE: Acknowledged. No further instruction received at this time.

## 2020-07-27 ENCOUNTER — Other Ambulatory Visit: Payer: Self-pay | Admitting: Oncology

## 2020-07-29 ENCOUNTER — Other Ambulatory Visit: Payer: Self-pay | Admitting: Oncology

## 2020-07-31 MED FILL — IMBRUVICA 420 MG TAB: 420 | 28 days supply | Qty: 28 | Fill #0

## 2020-08-15 DIAGNOSIS — Z7984 Long term (current) use of oral hypoglycemic drugs: Secondary | ICD-10-CM | POA: Diagnosis not present

## 2020-08-15 DIAGNOSIS — Z1389 Encounter for screening for other disorder: Secondary | ICD-10-CM | POA: Diagnosis not present

## 2020-08-15 DIAGNOSIS — E46 Unspecified protein-calorie malnutrition: Secondary | ICD-10-CM | POA: Diagnosis not present

## 2020-08-15 DIAGNOSIS — E1169 Type 2 diabetes mellitus with other specified complication: Secondary | ICD-10-CM | POA: Diagnosis not present

## 2020-08-15 DIAGNOSIS — G4700 Insomnia, unspecified: Secondary | ICD-10-CM | POA: Diagnosis not present

## 2020-08-15 DIAGNOSIS — Z23 Encounter for immunization: Secondary | ICD-10-CM | POA: Diagnosis not present

## 2020-08-15 DIAGNOSIS — N529 Male erectile dysfunction, unspecified: Secondary | ICD-10-CM | POA: Diagnosis not present

## 2020-08-15 DIAGNOSIS — E7800 Pure hypercholesterolemia, unspecified: Secondary | ICD-10-CM | POA: Diagnosis not present

## 2020-08-15 DIAGNOSIS — C9110 Chronic lymphocytic leukemia of B-cell type not having achieved remission: Secondary | ICD-10-CM | POA: Diagnosis not present

## 2020-08-15 DIAGNOSIS — I129 Hypertensive chronic kidney disease with stage 1 through stage 4 chronic kidney disease, or unspecified chronic kidney disease: Secondary | ICD-10-CM | POA: Diagnosis not present

## 2020-08-15 DIAGNOSIS — Z0000 Encounter for general adult medical examination without abnormal findings: Secondary | ICD-10-CM | POA: Diagnosis not present

## 2020-08-15 DIAGNOSIS — K219 Gastro-esophageal reflux disease without esophagitis: Secondary | ICD-10-CM | POA: Diagnosis not present

## 2020-08-21 ENCOUNTER — Other Ambulatory Visit: Payer: Self-pay | Admitting: Oncology

## 2020-08-26 MED FILL — IMBRUVICA 420 MG TAB: 420 | 28 days supply | Qty: 28 | Fill #0

## 2020-09-09 ENCOUNTER — Other Ambulatory Visit: Payer: Self-pay | Admitting: Oncology

## 2020-09-10 ENCOUNTER — Inpatient Hospital Stay: Payer: Medicare Other | Attending: Oncology

## 2020-09-10 ENCOUNTER — Other Ambulatory Visit: Payer: Self-pay

## 2020-09-10 ENCOUNTER — Inpatient Hospital Stay (HOSPITAL_BASED_OUTPATIENT_CLINIC_OR_DEPARTMENT_OTHER): Payer: Medicare Other | Admitting: Oncology

## 2020-09-10 VITALS — BP 125/50 | HR 85 | Temp 97.9°F | Resp 13 | Ht 67.0 in | Wt 125.0 lb

## 2020-09-10 DIAGNOSIS — C9110 Chronic lymphocytic leukemia of B-cell type not having achieved remission: Secondary | ICD-10-CM

## 2020-09-10 DIAGNOSIS — D696 Thrombocytopenia, unspecified: Secondary | ICD-10-CM | POA: Insufficient documentation

## 2020-09-10 DIAGNOSIS — Z85048 Personal history of other malignant neoplasm of rectum, rectosigmoid junction, and anus: Secondary | ICD-10-CM | POA: Insufficient documentation

## 2020-09-10 DIAGNOSIS — Z79899 Other long term (current) drug therapy: Secondary | ICD-10-CM | POA: Insufficient documentation

## 2020-09-10 DIAGNOSIS — Z9221 Personal history of antineoplastic chemotherapy: Secondary | ICD-10-CM | POA: Diagnosis not present

## 2020-09-10 DIAGNOSIS — C9112 Chronic lymphocytic leukemia of B-cell type in relapse: Secondary | ICD-10-CM | POA: Diagnosis present

## 2020-09-10 DIAGNOSIS — Z7982 Long term (current) use of aspirin: Secondary | ICD-10-CM | POA: Diagnosis not present

## 2020-09-10 DIAGNOSIS — Z7984 Long term (current) use of oral hypoglycemic drugs: Secondary | ICD-10-CM | POA: Insufficient documentation

## 2020-09-10 LAB — CBC WITH DIFFERENTIAL (CANCER CENTER ONLY)
Abs Immature Granulocytes: 0.22 10*3/uL — ABNORMAL HIGH (ref 0.00–0.07)
Basophils Absolute: 0 10*3/uL (ref 0.0–0.1)
Basophils Relative: 0 %
Eosinophils Absolute: 0.5 10*3/uL (ref 0.0–0.5)
Eosinophils Relative: 1 %
HCT: 31.5 % — ABNORMAL LOW (ref 39.0–52.0)
Hemoglobin: 9.7 g/dL — ABNORMAL LOW (ref 13.0–17.0)
Immature Granulocytes: 1 %
Lymphocytes Relative: 92 %
Lymphs Abs: 44.2 10*3/uL — ABNORMAL HIGH (ref 0.7–4.0)
MCH: 28.4 pg (ref 26.0–34.0)
MCHC: 30.8 g/dL (ref 30.0–36.0)
MCV: 92.4 fL (ref 80.0–100.0)
Monocytes Absolute: 0.2 10*3/uL (ref 0.1–1.0)
Monocytes Relative: 1 %
Neutro Abs: 2.6 10*3/uL (ref 1.7–7.7)
Neutrophils Relative %: 5 %
Platelet Count: 174 10*3/uL (ref 150–400)
RBC: 3.41 MIL/uL — ABNORMAL LOW (ref 4.22–5.81)
RDW: 14.4 % (ref 11.5–15.5)
WBC Count: 47.7 10*3/uL — ABNORMAL HIGH (ref 4.0–10.5)
nRBC: 0 % (ref 0.0–0.2)

## 2020-09-10 LAB — CMP (CANCER CENTER ONLY)
ALT: 12 U/L (ref 0–44)
AST: 12 U/L — ABNORMAL LOW (ref 15–41)
Albumin: 2.8 g/dL — ABNORMAL LOW (ref 3.5–5.0)
Alkaline Phosphatase: 151 U/L — ABNORMAL HIGH (ref 38–126)
Anion gap: 7 (ref 5–15)
BUN: 29 mg/dL — ABNORMAL HIGH (ref 8–23)
CO2: 27 mmol/L (ref 22–32)
Calcium: 9.6 mg/dL (ref 8.9–10.3)
Chloride: 105 mmol/L (ref 98–111)
Creatinine: 2.28 mg/dL — ABNORMAL HIGH (ref 0.61–1.24)
GFR, Estimated: 29 mL/min — ABNORMAL LOW (ref >60)
Glucose, Bld: 258 mg/dL — ABNORMAL HIGH (ref 70–99)
Potassium: 4.4 mmol/L (ref 3.5–5.1)
Sodium: 139 mmol/L (ref 135–145)
Total Bilirubin: 0.7 mg/dL (ref 0.3–1.2)
Total Protein: 6.2 g/dL — ABNORMAL LOW (ref 6.5–8.1)

## 2020-09-10 NOTE — Progress Notes (Signed)
Hematology and Oncology Follow Up Visit  [name] [id number] 06-25-1941 79 y.o. 09/10/2020 2:56 PM [name], [name], MD   Principle Diagnosis: 79 year old man with CLL diagnosed in 2013.  He presented with pancytopenia and lymphocytosis and relapsed disease diagnosed in May 2021.   Secondary diagnosis: T2N0 rectal cancer diagnosed in 2009 is currently in remission after surgical resection.    Prior Therapy: He is status post FCR chemotherapy completed on on April of 2013 and accomplished complete response.  He developed relapsed disease and May 2021.   Current therapy: Ibrutinib 420 mg daily started in May 2021.  Interim History: [name] returns today for repeat evaluation.  Since the last visit, he reports no major changes in his health.  He has tolerated ibrutinib without any major complaints.  He denies any nausea vomiting or abdominal pain.  He is trying to eat better and has gained little weight since the last time.  He denies any medic easier, melena or bruising.         Medications: Updated on review. Current Outpatient Medications  Medication Sig Dispense Refill  . amLODipine (NORVASC) 5 MG tablet Take 5 mg by mouth every morning.    Marland Kitchen aspirin 81 MG tablet Take 81 mg by mouth every morning.     Marland Kitchen atorvastatin (LIPITOR) 40 MG tablet Take 40 mg by mouth every evening.     . calcium carbonate (TUMS - DOSED IN MG ELEMENTAL CALCIUM) 500 MG chewable tablet Chew 1 tablet by mouth daily as needed for indigestion or heartburn.    . IMBRUVICA 420 MG TABS TAKE 1 TABLET BY MOUTH DAILY 28 tablet 0  . lisinopril (ZESTRIL) 20 MG tablet Take 20 mg by mouth every morning.    . metFORMIN (GLUCOPHAGE) 1000 MG tablet Take 1,000 mg by mouth 2 (two) times daily with a meal.  0  . metoprolol tartrate (LOPRESSOR) 25 MG tablet Take 0.5 tablets (12.5 mg total) by mouth 2 (two) times daily. 30 tablet 2  . Multiple Vitamin (MULTIVITAMIN WITH MINERALS) TABS tablet Take 1  tablet by mouth daily.    . ondansetron (ZOFRAN-ODT) 8 MG disintegrating tablet TAKE 1 TABLET (8 MG TOTAL) BY MOUTH EVERY 8 (EIGHT) HOURS AS NEEDED FOR NAUSEA OR VOMITING. 20 tablet 0  . pantoprazole (PROTONIX) 40 MG tablet Take 1 tablet (40 mg total) by mouth daily. (Patient not taking: Reported on 04/11/2019) 90 tablet 0  . prochlorperazine (COMPAZINE) 10 MG tablet TAKE 1 TABLET (10 MG TOTAL) BY MOUTH EVERY 6 (SIX) HOURS AS NEEDED FOR NAUSEA OR VOMITING. 30 tablet 1  . SHINGRIX injection     . traZODone (DESYREL) 50 MG tablet Take 1 tablet (50 mg total) by mouth at bedtime as needed for up to 60 doses for sleep. 30 tablet 1  . zolpidem (AMBIEN) 10 MG tablet Take 5-10 mg by mouth at bedtime as needed.     No current facility-administered medications for this visit.     Allergies:  Allergies  Allergen Reactions  . Hydrocodone-Acetaminophen Other (See Comments)    Loss of weight     Physical Exam:     Blood pressure (!) 125/50, pulse 85, temperature 97.9 F (36.6 C), temperature source Tympanic, resp. rate 13, height 5\' 7"  (1.702 m), weight 125 lb (56.7 kg), SpO2 100 %.     ECOG: 1    General appearance: Comfortable appearing without any discomfort Head: Normocephalic without any trauma Oropharynx: Mucous membranes are moist and pink without any thrush or  ulcers. Eyes: Pupils are equal and round reactive to light. Lymph nodes: No cervical, supraclavicular, inguinal or axillary lymphadenopathy.   Heart:regular rate and rhythm.  S1 and S2 without leg edema. Lung: Clear without any rhonchi or wheezes.  No dullness to percussion. Abdomin: Soft, nontender, nondistended with good bowel sounds.  No hepatosplenomegaly. Musculoskeletal: No joint deformity or effusion.  Full range of motion noted. Neurological: No deficits noted on motor, sensory and deep tendon reflex exam. Skin: No petechial rash or dryness.  Appeared moist.          Lab Results: Lab Results  Component  Value Date   WBC 133.3 (HH) 07/10/2020   HGB 10.0 (L) 07/10/2020   HCT 34.4 (L) 07/10/2020   MCV 96.6 07/10/2020   PLT 113 (L) 07/10/2020     Chemistry      Component Value Date/Time   NA 141 07/10/2020 1445   NA 140 07/13/2017 1456   K 4.1 07/10/2020 1445   K 4.6 07/13/2017 1456   CL 106 07/10/2020 1445   CL 104 01/10/2013 1128   CO2 27 07/10/2020 1445   CO2 24 07/13/2017 1456   BUN 24 (H) 07/10/2020 1445   BUN 18.9 07/13/2017 1456   CREATININE 1.76 (H) 07/10/2020 1445   CREATININE 1.2 07/13/2017 1456      Component Value Date/Time   CALCIUM 9.6 07/10/2020 1445   CALCIUM 9.5 07/13/2017 1456   ALKPHOS 139 (H) 07/10/2020 1445   ALKPHOS 121 07/13/2017 1456   AST 14 (L) 07/10/2020 1445   AST 17 07/13/2017 1456   ALT 10 07/10/2020 1445   ALT 19 07/13/2017 1456   BILITOT 0.7 07/10/2020 1445   BILITOT 0.60 07/13/2017 1456             Impression and Plan:  79 year old man with  1.  Relapsed CLL diagnosed in May 2021 after presenting with lymphadenopathy and cytopenia.  He is currently on ibrutinib which she has tolerated without any major complications.  His lymphocytosis showed some mild decline in improvement in his hemoglobin and platelet count for the time being.  Risks and benefits of continuing this treatment on long-term complications were discussed.  Bleeding, arrhythmia among others were reiterated today.  Alternative treatment options including chemotherapy was also discussed she develops less than adequate response.  Laboratory data from today showed a robust decline in his white cell count up to 47 and stable hemoglobin.  He is agreeable to continue.   2. Rectal cancer: He presented with a T2N0 disease without any evidence of relapse.   3. Thrombocytopenia: Continues to improve with treatment of CLL.  His platelet count today is up within normal range.   4.  Anorexia: He is eating better and gained some weight since the last visit.  Encouraged to  continue nutritional supplements and monitor her future visits.  5. Followup: He will return in 8 to 10 weeks for a follow-up evaluation.  30  minutes were spent on this visit.  The time was dedicated to reviewing disease status, discussing treatment options and future plan of care review.   [name], MD 12/14/20212:56 PM

## 2020-09-17 ENCOUNTER — Other Ambulatory Visit: Payer: Self-pay | Admitting: Oncology

## 2020-09-18 ENCOUNTER — Other Ambulatory Visit: Payer: Self-pay | Admitting: Oncology

## 2020-09-23 MED FILL — IMBRUVICA 420 MG TAB: 420 | 28 days supply | Qty: 28 | Fill #0

## 2020-10-15 ENCOUNTER — Other Ambulatory Visit: Payer: Self-pay | Admitting: Oncology

## 2020-10-16 ENCOUNTER — Other Ambulatory Visit: Payer: Self-pay | Admitting: Oncology

## 2020-10-22 ENCOUNTER — Other Ambulatory Visit: Payer: Self-pay

## 2020-10-22 ENCOUNTER — Emergency Department (HOSPITAL_COMMUNITY): Payer: Medicare Other

## 2020-10-22 ENCOUNTER — Encounter (HOSPITAL_COMMUNITY): Payer: Self-pay

## 2020-10-22 ENCOUNTER — Inpatient Hospital Stay (HOSPITAL_COMMUNITY)
Admission: EM | Admit: 2020-10-22 | Discharge: 2020-10-25 | DRG: 177 | Disposition: A | Discharge status: Home / Self Care | Payer: Medicare Other | Attending: Internal Medicine | Admitting: Internal Medicine

## 2020-10-22 ENCOUNTER — Telehealth: Payer: Self-pay

## 2020-10-22 DIAGNOSIS — C9110 Chronic lymphocytic leukemia of B-cell type not having achieved remission: Secondary | ICD-10-CM | POA: Diagnosis present

## 2020-10-22 DIAGNOSIS — E7800 Pure hypercholesterolemia, unspecified: Secondary | ICD-10-CM | POA: Diagnosis not present

## 2020-10-22 DIAGNOSIS — E119 Type 2 diabetes mellitus without complications: Secondary | ICD-10-CM

## 2020-10-22 DIAGNOSIS — R55 Syncope and collapse: Secondary | ICD-10-CM | POA: Diagnosis not present

## 2020-10-22 DIAGNOSIS — U071 COVID-19: Secondary | ICD-10-CM | POA: Diagnosis not present

## 2020-10-22 DIAGNOSIS — Z87891 Personal history of nicotine dependence: Secondary | ICD-10-CM

## 2020-10-22 DIAGNOSIS — E785 Hyperlipidemia, unspecified: Secondary | ICD-10-CM | POA: Diagnosis present

## 2020-10-22 DIAGNOSIS — Z7984 Long term (current) use of oral hypoglycemic drugs: Secondary | ICD-10-CM

## 2020-10-22 DIAGNOSIS — Z803 Family history of malignant neoplasm of breast: Secondary | ICD-10-CM

## 2020-10-22 DIAGNOSIS — Z85048 Personal history of other malignant neoplasm of rectum, rectosigmoid junction, and anus: Secondary | ICD-10-CM

## 2020-10-22 DIAGNOSIS — Z79899 Other long term (current) drug therapy: Secondary | ICD-10-CM

## 2020-10-22 DIAGNOSIS — J1282 Pneumonia due to coronavirus disease 2019: Secondary | ICD-10-CM | POA: Diagnosis present

## 2020-10-22 DIAGNOSIS — R6510 Systemic inflammatory response syndrome (SIRS) of non-infectious origin without acute organ dysfunction: Secondary | ICD-10-CM | POA: Diagnosis not present

## 2020-10-22 DIAGNOSIS — N1832 Chronic kidney disease, stage 3b: Secondary | ICD-10-CM | POA: Diagnosis present

## 2020-10-22 DIAGNOSIS — Z885 Allergy status to narcotic agent status: Secondary | ICD-10-CM

## 2020-10-22 DIAGNOSIS — Z7982 Long term (current) use of aspirin: Secondary | ICD-10-CM

## 2020-10-22 DIAGNOSIS — N179 Acute kidney failure, unspecified: Secondary | ICD-10-CM | POA: Diagnosis not present

## 2020-10-22 DIAGNOSIS — S199XXA Unspecified injury of neck, initial encounter: Secondary | ICD-10-CM | POA: Diagnosis not present

## 2020-10-22 DIAGNOSIS — I129 Hypertensive chronic kidney disease with stage 1 through stage 4 chronic kidney disease, or unspecified chronic kidney disease: Secondary | ICD-10-CM | POA: Diagnosis present

## 2020-10-22 DIAGNOSIS — E860 Dehydration: Secondary | ICD-10-CM | POA: Diagnosis present

## 2020-10-22 DIAGNOSIS — Z66 Do not resuscitate: Secondary | ICD-10-CM | POA: Diagnosis present

## 2020-10-22 DIAGNOSIS — E1122 Type 2 diabetes mellitus with diabetic chronic kidney disease: Secondary | ICD-10-CM | POA: Diagnosis present

## 2020-10-22 DIAGNOSIS — R519 Headache, unspecified: Secondary | ICD-10-CM | POA: Diagnosis not present

## 2020-10-22 DIAGNOSIS — I10 Essential (primary) hypertension: Secondary | ICD-10-CM | POA: Diagnosis not present

## 2020-10-22 DIAGNOSIS — E1165 Type 2 diabetes mellitus with hyperglycemia: Secondary | ICD-10-CM | POA: Diagnosis not present

## 2020-10-22 DIAGNOSIS — R059 Cough, unspecified: Secondary | ICD-10-CM | POA: Diagnosis not present

## 2020-10-22 DIAGNOSIS — Z8041 Family history of malignant neoplasm of ovary: Secondary | ICD-10-CM

## 2020-10-22 DIAGNOSIS — R42 Dizziness and giddiness: Secondary | ICD-10-CM | POA: Diagnosis not present

## 2020-10-22 LAB — COMPREHENSIVE METABOLIC PANEL
ALT: 20 U/L (ref 0–44)
AST: 28 U/L (ref 15–41)
Albumin: 3.5 g/dL (ref 3.5–5.0)
Alkaline Phosphatase: 115 U/L (ref 38–126)
Anion gap: 12 (ref 5–15)
BUN: 29 mg/dL — ABNORMAL HIGH (ref 8–23)
CO2: 20 mmol/L — ABNORMAL LOW (ref 22–32)
Calcium: 9 mg/dL (ref 8.9–10.3)
Chloride: 104 mmol/L (ref 98–111)
Creatinine, Ser: 1.62 mg/dL — ABNORMAL HIGH (ref 0.61–1.24)
GFR, Estimated: 43 mL/min — ABNORMAL LOW (ref >60)
Glucose, Bld: 257 mg/dL — ABNORMAL HIGH (ref 70–99)
Potassium: 4.6 mmol/L (ref 3.5–5.1)
Sodium: 136 mmol/L (ref 135–145)
Total Bilirubin: 0.7 mg/dL (ref 0.3–1.2)
Total Protein: 6.3 g/dL — ABNORMAL LOW (ref 6.5–8.1)

## 2020-10-22 LAB — TRIGLYCERIDES: Triglycerides: 87 mg/dL (ref <150)

## 2020-10-22 LAB — CBG MONITORING, ED
Glucose-Capillary: 248 mg/dL — ABNORMAL HIGH (ref 70–99)
Glucose-Capillary: 240 mg/dL — ABNORMAL HIGH (ref 70–99)
Glucose-Capillary: 206 mg/dL — ABNORMAL HIGH (ref 70–99)

## 2020-10-22 LAB — CBC WITH DIFFERENTIAL/PLATELET
Abs Immature Granulocytes: 0.05 10*3/uL (ref 0.00–0.07)
Basophils Absolute: 0 10*3/uL (ref 0.0–0.1)
Basophils Relative: 0 %
Eosinophils Absolute: 0 10*3/uL (ref 0.0–0.5)
Eosinophils Relative: 0 %
HCT: 33.7 % — ABNORMAL LOW (ref 39.0–52.0)
Hemoglobin: 10.2 g/dL — ABNORMAL LOW (ref 13.0–17.0)
Immature Granulocytes: 0 %
Lymphocytes Relative: 98 %
Lymphs Abs: 67.5 10*3/uL — ABNORMAL HIGH (ref 0.7–4.0)
MCH: 28.7 pg (ref 26.0–34.0)
MCHC: 30.3 g/dL (ref 30.0–36.0)
MCV: 94.7 fL (ref 80.0–100.0)
Monocytes Absolute: 0.1 10*3/uL (ref 0.1–1.0)
Monocytes Relative: 0 %
Neutro Abs: 1.4 10*3/uL — ABNORMAL LOW (ref 1.7–7.7)
Neutrophils Relative %: 2 %
Platelets: 120 10*3/uL — ABNORMAL LOW (ref 150–400)
RBC: 3.56 MIL/uL — ABNORMAL LOW (ref 4.22–5.81)
RDW: 15.7 % — ABNORMAL HIGH (ref 11.5–15.5)
WBC: 69.2 10*3/uL (ref 4.0–10.5)
nRBC: 0 % (ref 0.0–0.2)

## 2020-10-22 LAB — LACTIC ACID, PLASMA: Lactic Acid, Venous: 3.2 mmol/L (ref 0.5–1.9)

## 2020-10-22 LAB — TROPONIN I (HIGH SENSITIVITY)
Troponin I (High Sensitivity): 7 ng/L (ref <18)
Troponin I (High Sensitivity): 6 ng/L (ref <18)

## 2020-10-22 LAB — FIBRINOGEN: Fibrinogen: 457 mg/dL (ref 210–475)

## 2020-10-22 LAB — LACTATE DEHYDROGENASE: LDH: 184 U/L (ref 98–192)

## 2020-10-22 LAB — POC SARS CORONAVIRUS 2 AG -  ED: SARS Coronavirus 2 Ag: POSITIVE — AB

## 2020-10-22 LAB — FERRITIN: Ferritin: 50 ng/mL (ref 24–336)

## 2020-10-22 LAB — PROCALCITONIN: Procalcitonin: 2.79 ng/mL

## 2020-10-22 LAB — D-DIMER, QUANTITATIVE: D-Dimer, Quant: 0.65 ug/mL-FEU — ABNORMAL HIGH (ref 0.00–0.50)

## 2020-10-22 LAB — C-REACTIVE PROTEIN: CRP: 1.2 mg/dL — ABNORMAL HIGH (ref <1.0)

## 2020-10-22 MED ORDER — IOHEXOL 350 MG/ML SOLN
75.0000 mL | Freq: Once | INTRAVENOUS | Status: AC | PRN
  Administered 2020-10-22: 75 mL via INTRAVENOUS

## 2020-10-22 MED ORDER — SODIUM CHLORIDE 0.9 % IV BOLUS
1000.0000 mL | Freq: Once | INTRAVENOUS | Status: AC
  Administered 2020-10-22: 1000 mL via INTRAVENOUS

## 2020-10-22 MED ORDER — SODIUM CHLORIDE 0.9 % IV BOLUS
500.0000 mL | Freq: Once | INTRAVENOUS | Status: AC
  Administered 2020-10-22: 500 mL via INTRAVENOUS

## 2020-10-22 MED ORDER — SODIUM CHLORIDE 0.9 % IV SOLN
200.0000 mg | Freq: Once | INTRAVENOUS | Status: AC
  Administered 2020-10-23: 200 mg via INTRAVENOUS
  Filled 2020-10-22: qty 200

## 2020-10-22 MED ORDER — DEXAMETHASONE SODIUM PHOSPHATE 10 MG/ML IJ SOLN
10.0000 mg | Freq: Once | INTRAMUSCULAR | Status: AC
  Administered 2020-10-22: 10 mg via INTRAVENOUS
  Filled 2020-10-22: qty 1

## 2020-10-22 MED ORDER — SODIUM CHLORIDE 0.9 % IV SOLN
100.0000 mg | Freq: Every day | INTRAVENOUS | Status: DC
  Administered 2020-10-24 – 2020-10-25 (×2): 100 mg via INTRAVENOUS
  Filled 2020-10-22 (×2): qty 20

## 2020-10-22 MED FILL — IMBRUVICA 420 MG TAB: 420 | 28 days supply | Qty: 28 | Fill #0

## 2020-10-22 NOTE — ED Notes (Signed)
Patient transported to CT 

## 2020-10-22 NOTE — H&P (Signed)
[name] ELF:[id number] DOB: [birth year] DOA: 10/22/2020    PCP: [name], MD   Outpatient Specialists:      Oncology  Dr. Alen Blew    Patient arrived to ER on 10/22/20 at 1621 Referred by Attending Long, Wonda Olds, MD   Patient coming from: home Lives   With family    Chief Complaint:   Chief Complaint  Patient presents with  . Cough    HPI: [name] is a 80 y.o. male with medical history significant of CLL pancytopenia and lymphocytosis and relapsed disease, rectal cancer diagnosed in 2009, DM2, HLD ,HTN    Presented with   Cough for the past 2 wks Patient denies any sick contacts that he is aware of. While in the waiting room felt light headed and passed out although now patient states that he was just weak and stumbled and fell down No witnessed seizure no vertigo.  No localized neurological signs Patient hit his head. He denies any chest pain or shortness of breath  Tested positive for covid History of CLL lymphoma currently receiving chemotherapy Infectious risk factors:  Reports  dry cough,    KNOWN COVID POSITIVE tested at home today  Has  been vaccinated against COVID and boossted   Initial COVID TEST   POSITIVE,     Lab Results  Component Value Date   [place] NEGATIVE 04/11/2019   [place] NEGATIVE 03/25/2019     Regarding pertinent Chronic problems:     Hyperlipidemia -  on statins lipitor Lipid Panel     Component Value Date/Time   TRIG 87 10/22/2020 1933     HTN on norvasc    DM 2 -  Lab Results  Component Value Date   HGBA1C  01/09/2008    5.6 (NOTE)   The ADA recommends the following therapeutic goals for glycemic   control related to Hgb A1C measurement:   Goal of Therapy:   < 7.0% Hgb A1C   Action Suggested:  > 8.0% Hgb A1C   Ref:  Diabetes Care, 22, Suppl. 1, 1999   PO meds only,      CKD stage IIb- baseline Cr 1.7 CrCl cannot be calculated (Unknown ideal weight.).  Lab Results  Component Value Date    CREATININE 1.62 (H) 10/22/2020   CREATININE 2.28 (H) 09/10/2020   CREATININE 1.76 (H) 07/10/2020     Chronic anemia - baseline hg Hemoglobin & Hematocrit  Recent Labs    07/10/20 1445 09/10/20 1509 10/22/20 1932  HGB 10.0* 9.7* 10.2*     While in ER: Tachy up to 120 Orthostatic in ER Elevated lactic acid  tachypneic Not hypoxic Synopsized/fell down while in the waiting room  Started on ramdesiveir and decadron CTA neg for PE     Hospitalist was called for admission for syncope, dehydration  The following Work up has been ordered so far:  Orders Placed This Encounter  Procedures  . Blood Culture (routine x 2)  . DG Chest 2 View  . CT Head Wo Contrast  . CT Cervical Spine Wo Contrast  . CT Angio Chest PE W and/or Wo Contrast  . Comprehensive metabolic panel  . CBC with Differential  . Lactic acid, plasma  . D-dimer, quantitative  . Procalcitonin  . Ferritin  . Triglycerides  . Fibrinogen  . C-reactive protein  . Lactate dehydrogenase  . Diet NPO time specified  . Orthostatic vital signs  . Cardiac monitoring  . Insert peripheral IV x 2  . Initiate  Carrier Fluid Protocol  . Place surgical mask on patient  . Patient to wear surgical mask during transportation  . Assess patient for ability to self-prone. If able (can move self in bed, ambulate) and stable (SpO2 and oxygen requirement):  . RN/NT - Document specific oxygen requirements in CHL  . Notify EDP if new oxygen requirements escalates > 4L per minute [place]  . RN to draw the following extra tubes:  . Consult to hospitalist  ALL PATIENTS BEING ADMITTED/HAVING PROCEDURES NEED COVID-19 SCREENING  . Airborne and Contact precautions  . Pulse oximetry, continuous  . POC SARS Coronavirus 2 Ag-ED - Nasal Swab  . CBG monitoring, ED  . CBG monitoring, ED  . CBG monitoring, ED  . ED EKG  . Saline lock IV     Following Medications were ordered in ER: Medications  sodium chloride 0.9 % bolus 500 mL (0 mLs  Intravenous Stopped 10/22/20 2234)  iohexol (OMNIPAQUE) 350 MG/ML injection 75 mL (75 mLs Intravenous Contrast Given 10/22/20 2139)  sodium chloride 0.9 % bolus 1,000 mL (1,000 mLs Intravenous New Bag/Given 10/22/20 2307)  dexamethasone (DECADRON) injection 10 mg (10 mg Intravenous Given 10/22/20 2307)        Consult Orders  (From admission, onward)         Start     Ordered   10/22/20 2253  Consult to hospitalist  ALL PATIENTS BEING ADMITTED/HAVING PROCEDURES NEED COVID-19 SCREENING  Once       Comments: ALL PATIENTS BEING ADMITTED/HAVING PROCEDURES NEED COVID-19 SCREENING  Provider:  (Not yet assigned)  Question Answer Comment  Place call to: [facility]   Reason for Consult Admit      10/22/20 2252          Significant initial  Findings: Abnormal Labs Reviewed  COMPREHENSIVE METABOLIC PANEL - Abnormal; Notable for the following components:      Result Value   CO2 20 (*)    Glucose, Bld 257 (*)    BUN 29 (*)    Creatinine, Ser 1.62 (*)    Total Protein 6.3 (*)    GFR, Estimated 43 (*)    All other components within normal limits  CBC WITH DIFFERENTIAL/PLATELET - Abnormal; Notable for the following components:   WBC 69.2 (*)    RBC 3.56 (*)    Hemoglobin 10.2 (*)    HCT 33.7 (*)    RDW 15.7 (*)    Platelets 120 (*)    Neutro Abs 1.4 (*)    Lymphs Abs 67.5 (*)    All other components within normal limits  LACTIC ACID, PLASMA - Abnormal; Notable for the following components:   Lactic Acid, Venous 3.2 (*)    All other components within normal limits  D-DIMER, QUANTITATIVE (NOT AT [facility]) - Abnormal; Notable for the following components:   D-Dimer, Quant 0.65 (*)    All other components within normal limits  C-REACTIVE PROTEIN - Abnormal; Notable for the following components:   CRP 1.2 (*)    All other components within normal limits  POC SARS CORONAVIRUS 2 AG -  ED - Abnormal; Notable for the following components:   SARS Coronavirus 2 Ag POSITIVE (*)    All  other components within normal limits  CBG MONITORING, ED - Abnormal; Notable for the following components:   Glucose-Capillary 248 (*)    All other components within normal limits  CBG MONITORING, ED - Abnormal; Notable for the following components:   Glucose-Capillary 240 (*)    All  other components within normal limits  CBG MONITORING, ED - Abnormal; Notable for the following components:   Glucose-Capillary 206 (*)    All other components within normal limits     Otherwise labs showing:    Recent Labs  Lab 10/22/20 1932 10/22/20 2339  NA 136  --   K 4.6  --   CO2 20*  --   GLUCOSE 257*  --   BUN 29*  --   CREATININE 1.62*  --   CALCIUM 9.0  --   MG  --  1.5*  PHOS  --  2.4*    Cr    Stable,  Lab Results  Component Value Date   CREATININE 1.62 (H) 10/22/2020   CREATININE 2.28 (H) 09/10/2020   CREATININE 1.76 (H) 07/10/2020    Recent Labs  Lab 10/22/20 1932  AST 28  ALT 20  ALKPHOS 115  BILITOT 0.7  PROT 6.3*  ALBUMIN 3.5   Lab Results  Component Value Date   CALCIUM 9.0 10/22/2020     WBC      Component Value Date/Time   WBC 69.2 (HH) 10/22/2020 1932   LYMPHSABS 67.5 (H) 10/22/2020 1932   LYMPHSABS 1.6 07/13/2017 1456   MONOABS 0.1 10/22/2020 1932   MONOABS 0.5 07/13/2017 1456   EOSABS 0.0 10/22/2020 1932   EOSABS 0.2 07/13/2017 1456   BASOSABS 0.0 10/22/2020 1932   BASOSABS 0.0 07/13/2017 1456    Plt: Lab Results  Component Value Date   PLT 120 (L) 10/22/2020    Lactic Acid, Venous    Component Value Date/Time   LATICACIDVEN 3.2 (HH) 10/22/2020 2041   Lactic Acid, Venous    Component Value Date/Time   LATICACIDVEN 1.5 10/22/2020 2241    Procalcitonin 2.79   COVID-19 Labs  Recent Labs    10/22/20 1933 10/22/20 2130  DDIMER 0.65*  --   FERRITIN 50  --   LDH  --  184  CRP 1.2*  --     Lab Results  Component Value Date   SARSCOV2NAA NEGATIVE 04/11/2019   [place] NEGATIVE 03/25/2019     HG/HCT  stable,        Component Value Date/Time   HGB 10.2 (L) 10/22/2020 1932   HGB 9.7 (L) 09/10/2020 1509   HGB 13.0 07/13/2017 1456   HCT 33.7 (L) 10/22/2020 1932   HCT 37.9 (L) 07/13/2017 1456   MCV 94.7 10/22/2020 1932   MCV 86.0 07/13/2017 1456    No results for input(s): LIPASE, AMYLASE in the last 168 hours. No results for input(s): AMMONIA in the last 168 hours.     Troponin 6 Cardiac Panel (last 3 results) Recent Labs    10/22/20 2339  CKTOTAL 31*   ECG: Ordered Personally reviewed by me showing: HR : 113 Rhythm:  Sinus tachycardia   no evidence of ischemic changes QTC 446    DM  labs:  HbA1C: No results for input(s): HGBA1C in the last 8760 hours.     CBG (last 3)  Recent Labs    10/22/20 2225 10/23/20 0024 10/23/20 0042  GLUCAP 206* 202* 202*       UA ordered   Ordered  CT HEAD  NON acute  CXR -  NON acute   CTA chest -   no PE,  infiltrates      ED Triage Vitals  Enc Vitals Group     BP 10/22/20 1634 (!) 167/96     Pulse Rate 10/22/20 1634 86  Resp 10/22/20 1634 16     Temp 10/22/20 1631 99.6 F (37.6 C)     Temp Source 10/22/20 1631 Oral     SpO2 10/22/20 1634 98 %     Weight --      Height --      Head Circumference --      Peak Flow --      Pain Score 10/22/20 1633 0     Pain Loc --      Pain Edu? --      Excl. in [place]? --   TMAX(24)@       Latest  Blood pressure (!) 150/66, pulse (!) 107, temperature 99.5 F (37.5 C), temperature source Oral, resp. rate (!) 23, SpO2 98 %.    Review of Systems:    Pertinent positives include:  dyspnea on exertion  dizziness,  productive cough Constitutional:  No weight loss, night sweats, Fevers, chills, fatigue, weight loss  HEENT:  No headaches, Difficulty swallowing,Tooth/dental problems,Sore throat,  No sneezing, itching, ear ache, nasal congestion, post nasal drip,  Cardio-vascular:  No chest pain, Orthopnea, PND, anasarca, palpitations.no Bilateral lower extremity swelling  GI:  No heartburn,  indigestion, abdominal pain, nausea, vomiting, diarrhea, change in bowel habits, loss of appetite, melena, blood in stool, hematemesis Resp:  no shortness of breath at rest. No , No excess mucus, no, No non-productive cough, No coughing up of blood.No change in color of mucus.No wheezing. Skin:  no rash or lesions. No jaundice GU:  no dysuria, change in color of urine, no urgency or frequency. No straining to urinate.  No flank pain.  Musculoskeletal:  No joint pain or no joint swelling. No decreased range of motion. No back pain.  Psych:  No change in mood or affect. No depression or anxiety. No memory loss.  Neuro: no localizing neurological complaints, no tingling, no weakness, no double vision, no gait abnormality, no slurred speech, no confusion  All systems reviewed and apart from [place] all are negative  Past Medical History:   Past Medical History:  Diagnosis Date  . CLL (chronic lymphocytic leukemia) (HCC)    chemotherapy  . Colon cancer ([place])   . Colorectal cancer ([place])    COLORECTAL DX 2/09, treated surgically  . Diabetes mellitus   . Hypercholesterolemia   . Hypertension   . Pneumonia    Bilateral pneumonia     Past Surgical History:  Procedure Laterality Date  . HEMICOLECTOMY    . nasal polyps     S/P nasal surgery    Social History:  Ambulatory   Independently      reports that he has quit smoking. His smoking use included cigarettes. He has a 28.00 pack-year smoking history. He has never used smokeless tobacco. He reports that he does not drink alcohol and does not use drugs.   Family History:   Family History  Problem Relation Age of Onset  . Ovarian cancer Mother   . Breast cancer Maternal Aunt     Allergies: Allergies  Allergen Reactions  . Hydrocodone-Acetaminophen Other (See Comments)    Loss of weight     Prior to Admission medications   Medication Sig Start Date End Date Taking? Authorizing Provider  amLODipine (NORVASC) 5 MG tablet  Take 5 mg by mouth every morning. 11/12/19  Yes [provider]  aspirin 81 MG tablet Take 81 mg by mouth every morning.    Yes [provider]  atorvastatin (LIPITOR) 40 MG tablet Take 40 mg by  mouth every evening.  01/10/13  Yes [provider]  hydroxypropyl methylcellulose / hypromellose (ISOPTO TEARS / GONIOVISC) 2.5 % ophthalmic solution Place 1 drop into both eyes in the morning and at bedtime.   Yes [provider]  IMBRUVICA 420 MG TABS TAKE 1 TABLET BY MOUTH DAILY Patient taking differently: Take 1 tablet by mouth daily. 10/15/20  Yes Wyatt Portela, MD  lisinopril (ZESTRIL) 20 MG tablet Take 20 mg by mouth every morning. 12/14/18  Yes [provider]  metFORMIN (GLUCOPHAGE) 1000 MG tablet Take 1,000 mg by mouth 2 (two) times daily with a meal. 06/05/17  Yes [provider]  metoprolol tartrate (LOPRESSOR) 25 MG tablet Take 0.5 tablets (12.5 mg total) by mouth 2 (two) times daily. 03/27/19 06/25/19 Yes Amin, Ankit Chirag, MD  ondansetron (ZOFRAN-ODT) 8 MG disintegrating tablet TAKE 1 TABLET BY MOUTH EVERY 8 HOURS AS NEEDED FOR NAUSEA OR VOMITING. Patient taking differently: Take 8 mg by mouth every 8 (eight) hours as needed for nausea or vomiting. 10/16/20  Yes Wyatt Portela, MD  pantoprazole (PROTONIX) 40 MG tablet Take 1 tablet (40 mg total) by mouth daily. 03/27/19 06/25/19 Yes Amin, Ankit Chirag, MD  prochlorperazine (COMPAZINE) 10 MG tablet TAKE 1 TABLET (10 MG TOTAL) BY MOUTH EVERY 6 (SIX) HOURS AS NEEDED FOR NAUSEA OR VOMITING. 10/16/20  Yes Shadad, Mathis Dad, MD  zolpidem (AMBIEN) 10 MG tablet Take 5 mg by mouth at bedtime as needed for sleep. 10/31/19  Yes [provider]  [facility] injection  08/17/19   [provider]  traZODone (DESYREL) 50 MG tablet Take 1 tablet (50 mg total) by mouth at bedtime as needed for up to 60 doses for sleep. Patient not taking: Reported on 10/22/2020 03/27/19   Damita Lack, MD    Physical Exam: Vitals with BMI 10/22/2020 10/22/2020 10/22/2020  Height - - -  Weight - - -  BMI - - -  Systolic 706 237 628  Diastolic 66 65 65  Pulse 315 108 109    1. General:  in No  Acute distress    Chronically ill  -appearing 2. Psychological: Alert and   Oriented 3. Head/ENT:     Dry Mucous Membranes                          Head Non traumatic, neck supple                            Poor Dentition 4. SKIN:   decreased Skin turgor,  Skin clean Dry and intact no rash 5. Heart: Regular rate and rhythm no Murmur, no Rub or gallop 6. Lungs:  no wheezes or crackles   7. Abdomen: Soft,   non-tender, Non distended  bowel sounds present 8. Lower extremities: no clubbing, cyanosis, no edema 9. Neurologically Grossly intact, moving all 4 extremities equally   10. MSK: Normal range of motion   All other LABS:     Recent Labs  Lab 10/22/20 1932  WBC 69.2*  NEUTROABS 1.4*  HGB 10.2*  HCT 33.7*  MCV 94.7  PLT 120*     Recent Labs  Lab 10/22/20 1932  NA 136  K 4.6  CL 104  CO2 20*  GLUCOSE 257*  BUN 29*  CREATININE 1.62*  CALCIUM 9.0     Recent Labs  Lab 10/22/20 1932  AST 28  ALT 20  ALKPHOS 115  BILITOT 0.7  PROT 6.3*  ALBUMIN 3.5    Cultures:    Component Value Date/Time   SDES  03/25/2019 0451    BLOOD LEFT ANTECUBITAL Performed at [facility], [place] [address], [place], [place] [address]    SPECREQUEST  03/25/2019 0451    BOTTLES DRAWN AEROBIC AND ANAEROBIC Blood Culture adequate volume Performed at [facility] [address], [place], [place] [address]    CULT (A) 03/25/2019 0451    STAPHYLOCOCCUS SPECIES (COAGULASE NEGATIVE) THE SIGNIFICANCE OF ISOLATING THIS ORGANISM FROM A SINGLE SET OF BLOOD CULTURES WHEN MULTIPLE SETS ARE DRAWN IS UNCERTAIN. PLEASE NOTIFY THE MICROBIOLOGY DEPARTMENT WITHIN ONE WEEK IF SPECIATION AND SENSITIVITIES ARE REQUIRED. Performed at [facility], [place] [address],  [place], [place] [address]    REPTSTATUS 03/27/2019 FINAL 03/25/2019 0451     Radiological Exams on Admission: DG Chest 2 View  Result Date: 10/22/2020 CLINICAL DATA:  Cough for 2 weeks EXAM: CHEST - 2 VIEW COMPARISON:  None. FINDINGS: The heart size and mediastinal contours are within normal limits. Both lungs are clear. The visualized skeletal structures are unremarkable. IMPRESSION: No active cardiopulmonary disease. Electronically Signed   By: Inez Catalina M.D.   On: 10/22/2020 17:00   CT Head Wo Contrast  Result Date: 10/22/2020 CLINICAL DATA:  Dizziness and fall. Now with headache and neck trauma. EXAM: CT HEAD WITHOUT CONTRAST CT CERVICAL SPINE WITHOUT CONTRAST TECHNIQUE: Multidetector CT imaging of the head and cervical spine was performed following the standard protocol without intravenous contrast. Multiplanar CT image reconstructions of the cervical spine were also generated. COMPARISON:  CT head 03/25/2019 and 04/11/2019 FINDINGS: CT HEAD FINDINGS Brain: No evidence of acute infarction, hemorrhage, hydrocephalus, extra-axial collection or mass lesion/mass effect. Mild cortical atrophy. Low-attenuation changes in the deep white matter consistent with small vessel ischemia. Vascular: Mild intracranial arterial vascular calcifications. Skull: Calvarium appears intact. No acute depressed skull fractures. Sinuses/Orbits: Paranasal sinuses and mastoid air cells are clear. Other: None. CT CERVICAL SPINE FINDINGS Alignment: Normal alignment. Skull base and vertebrae: Skull base appears intact. No vertebral compression deformities. No focal bone lesion or bone destruction. Bone cortex appears intact. Soft tissues and spinal canal: No prevertebral soft tissue swelling. No abnormal paraspinal soft tissue mass or infiltration. Disc levels: Intervertebral disc heights are normal. Mild endplate hypertrophic changes. Upper chest: Visualized lung apices are clear. Other: None. IMPRESSION: 1. No acute intracranial  abnormalities. Chronic atrophy and small vessel ischemic changes. 2. Normal alignment of the cervical spine. No acute displaced fractures identified. Electronically Signed   By: Lucienne Capers M.D.   On: 10/22/2020 19:56   CT Angio Chest PE W and/or Wo Contrast  Result Date: 10/22/2020 CLINICAL DATA:  Cough for 2 weeks. Dizziness and headache. COVID positive. Pulmonary embolus is suspected with high probability. EXAM: CT ANGIOGRAPHY CHEST WITH CONTRAST TECHNIQUE: Multidetector CT imaging of the chest was performed using the standard protocol during bolus administration of intravenous contrast. Multiplanar CT image reconstructions and MIPs were obtained to evaluate the vascular anatomy. CONTRAST:  27mL OMNIPAQUE IOHEXOL 350 MG/ML SOLN COMPARISON:  CT chest 02/15/2012.  Chest radiograph 10/22/2020 FINDINGS: Cardiovascular: Moderately good opacification of the central and segmental pulmonary arteries. No focal filling defects. No evidence of significant pulmonary embolus. Normal caliber thoracic aorta. Great vessel origins are patent. Aortic and coronary artery calcifications. Normal heart size. No pericardial effusions. Mediastinum/Nodes: Esophagus is decompressed. No significant lymphadenopathy in the chest. Lungs/Pleura: Motion artifact limits examination. Patchy airspace disease  in both lungs suggesting pneumonia. No pleural effusions. No pneumothorax. Airways are patent. Upper Abdomen: Several cysts in the liver and right kidney. Similar appearance to previous study. No acute abnormalities demonstrated in the visualized upper abdomen. Musculoskeletal: No chest wall abnormality. No acute or significant osseous findings. Review of the MIP images confirms the above findings. IMPRESSION: 1. No evidence of significant pulmonary embolus. 2. Patchy airspace disease in both lungs suggesting pneumonia. 3. Aortic atherosclerosis. Aortic Atherosclerosis (ICD10-I70.0). Electronically Signed   By: Lucienne Capers M.D.    On: 10/22/2020 21:51   CT Cervical Spine Wo Contrast  Result Date: 10/22/2020 CLINICAL DATA:  Dizziness and fall. Now with headache and neck trauma. EXAM: CT HEAD WITHOUT CONTRAST CT CERVICAL SPINE WITHOUT CONTRAST TECHNIQUE: Multidetector CT imaging of the head and cervical spine was performed following the standard protocol without intravenous contrast. Multiplanar CT image reconstructions of the cervical spine were also generated. COMPARISON:  CT head 03/25/2019 and 04/11/2019 FINDINGS: CT HEAD FINDINGS Brain: No evidence of acute infarction, hemorrhage, hydrocephalus, extra-axial collection or mass lesion/mass effect. Mild cortical atrophy. Low-attenuation changes in the deep white matter consistent with small vessel ischemia. Vascular: Mild intracranial arterial vascular calcifications. Skull: Calvarium appears intact. No acute depressed skull fractures. Sinuses/Orbits: Paranasal sinuses and mastoid air cells are clear. Other: None. CT CERVICAL SPINE FINDINGS Alignment: Normal alignment. Skull base and vertebrae: Skull base appears intact. No vertebral compression deformities. No focal bone lesion or bone destruction. Bone cortex appears intact. Soft tissues and spinal canal: No prevertebral soft tissue swelling. No abnormal paraspinal soft tissue mass or infiltration. Disc levels: Intervertebral disc heights are normal. Mild endplate hypertrophic changes. Upper chest: Visualized lung apices are clear. Other: None. IMPRESSION: 1. No acute intracranial abnormalities. Chronic atrophy and small vessel ischemic changes. 2. Normal alignment of the cervical spine. No acute displaced fractures identified. Electronically Signed   By: Lucienne Capers M.D.   On: 10/22/2020 19:56    Chart has been reviewed    Assessment/Plan  80 y.o. male with medical history significant of CLL pancytopenia and lymphocytosis and relapsed disease, rectal cancer diagnosed in 2009, DM2, HLD ,HTN      Admitted for AKI,  dehydration, covid infection, possible bacterail superinfection  Present on Admission: . Pneumonia due to COVID-19 virus -    FROM HOME   WITH KNOWN HX OF COVID19 ER Novel Corona Virus testing:  10/22/20  positive  Immunization status: Vaccinated not boosted    Following concerning LAB/ imaging findings:  COVID-19 Labs Mildly elevated CRP CT chest: GGO,      -Following work-up initiated:      sputum cultures  Ordered 10/23/20, Blood cultures  Ordered 10/23/20,   CTA negative for PE     Plan of treatment: Admit on [place] Precautions  -given evidence of Covid pneumonia and significant respiratory complaints started on steroids Decadron 6mg  q 24 hours And pharmacy consult for remdesivir  -Initiate antibiotics  given evidence worrisome for superinfection Given elevated procalcitonin And actually evidence indicated low absolute neutrophil count Patient is at risk for complications - Will follow daily d.dimer - Assess for ability to prone  - Supportive management -Fluid sparing resuscitation  -Provide oxygen as needed currently on  RA  SpO2: 98 % - IF d.dimer elvated >5 will increase dose of lovenox    Poor Prognostic factors  80 y.o.  Personal hx of  DM2,  HTN,  And CLL    Will order Airborne and Contact precautions  Family/ patient prognosis discussion:  I have discussed case with the family/ patient  who are aware of their prognosis At this point they would like  to be DNR/DNI       The treatment plan and use of medications and known side effects were discussed with patient  It was clearly explained that there is no proven definitive treatment for COVID-19 infection yet. Any medications used here are based on case reports/anecdotal data which are not peer-reviewed and has not been studied using randomized control trials.  Complete risks and long-term side effects are unknown, however in the best clinical judgment they seem to be of some clinical benefit rather than  medical risks.  Patient agree with the treatment plan and want to receive these treatments as indicated.     . Hypercholesterolemia  Chronic stable continue home meds  . Hypertension -hypertensive restart amlodipine when able to tolerate and monitor blood pressure carefully  . SIRS (systemic inflammatory response syndrome) (HCC) -patient with tachycardia increased respiration rate, and low ANC. History immunocompromise SIRS physiology more likely secondary to dehydration but also cannot completely rule out underlying bacterial infection. Blood cultures obtained Obtain UA Covid infection likely also contributing to SIRS presentation  Initial lactic acid elevated improved with rehydration No history of hypertension would avoid over aggressive fluid resuscitation Initiated Rocephin azithromycin for now Await results of blood cultures  . Syncope and collapse -unsure if true syncope versus presyncope in the setting of dehydration resulting in a fall.  Monitor on telemetry cycle cardiac enzymes initially negative Echogram and carotid Dopplers ordered Most likely syncope secondary to dehydration  Other plan as per orders.  CLL  -notify Dr. Alen Blew the patient has been admitted to the hospital Continue to monitor    Dm 2-  - Order Sensitive SSI    -  check TSH and HgA1C  - Hold by mouth medications    DVT prophylaxis:    Lovenox       Code Status:    Code Status: Prior   DNR/DNI  as per patient   I had personally discussed CODE STATUS with patient     Family Communication:   Family not at  Bedside    Disposition Plan:   To home once workup is complete and patient is stable   Following barriers for discharge:                            Electrolytes corrected                          able to transition to PO antibiotics                             Will need to be able to tolerate PO                            Will likely need home health, home O2, set up                       Consults called:   email dr. Osker Mason that pt has been admitted  Admission status:  ED Disposition    ED Disposition Condition [place]: [place] [[id number]]  Level of Care: Progressive [102]  Admit to Progressive based on following criteria: MULTISYSTEM  THREATS such as stable sepsis, metabolic/electrolyte imbalance with or without encephalopathy that is responding to early treatment.  Covid Evaluation: Confirmed COVID Positive  Diagnosis: Pneumonia due to COVID-19 virus [[id number]]  Admitting Physician: Toy Baker [[id number]]  Attending Physician: Toy Baker [[id number]]        Obs    Level of care progressive tele indefinitely please discontinue once patient no longer qualifies COVID-19 Labs    Lab Results  Component Value Date   [place] 04/11/2019     Precautions: admitted as  covid positive Airborne and Contact precautions   PPE: Used by the provider:   P100  eye Goggles,  Gloves  gown     Kyira Volkert 10/23/2020, 1:55 AM    [facility]     after 2 AM please page floor coverage PA If 7AM-7PM, please contact the day team taking care of the patient using [email or web address]   Patient was evaluated in the context of the global COVID-19 pandemic, which necessitated consideration that the patient might be at risk for infection with the SARS-CoV-2 virus that causes COVID-19. Institutional protocols and algorithms that pertain to the evaluation of patients at risk for COVID-19 are in a state of rapid change based on information released by regulatory bodies including the CDC and federal and state organizations. These policies and algorithms were followed during the patient's care.

## 2020-10-22 NOTE — ED Notes (Signed)
With pt permission wife was updated to pt dx and condition

## 2020-10-22 NOTE — Telephone Encounter (Signed)
Oral Oncology Patient Advocate Encounter   Was successful in securing patient a $10000 grant from [employer] to provide copayment coverage for [employer].  This will keep the out of pocket expense at $0.     I have spoken with patient.    The billing information is as follows and has been shared with [employer].   Member ID: [id number] Group ID: [id number] RxBin: [id number] PCN: [id number] Dates of Eligibility: 10/22/20 through 10/22/21  Fund name:  [place] [place] Phone [phone] Fax [phone] 10/22/2020 9:29 AM

## 2020-10-22 NOTE — ED Provider Notes (Signed)
Emergency Department Provider Note   I have reviewed the triage vital signs and the nursing notes.   HISTORY  Chief Complaint Cough   HPI [name] is a 80 y.o. male with PMH of CLL, HLD, and HTN presents to the ED with cough for the last 2 weeks.  He denies any associated shortness of breath or chest pain.  No fevers.  No sick contacts.  While waiting in the ED waiting room he got up to go to the bathroom and reports feeling lightheaded and fell to the ground.  This was witnessed by other patients in the waiting room with unknown LOC. No post-ictal state.  Patient denies any vertigo symptoms.  He has a mild headache after the fall but denies any unilateral weakness or numbness.  No vision change. No pain in the arms/legs.    Past Medical History:  Diagnosis Date  . CLL (chronic lymphocytic leukemia) (HCC)    chemotherapy  . Colon cancer ([place])   . Colorectal cancer ([place])    COLORECTAL DX 2/09, treated surgically  . Diabetes mellitus   . Hypercholesterolemia   . Hypertension   . Pneumonia    Bilateral pneumonia    Patient Active Problem List   Diagnosis Date Noted  . SIRS (systemic inflammatory response syndrome) ([place]) 03/25/2019  . Hypertensive urgency 03/25/2019  . Thrombocytopenia ([place]) 03/25/2019  . Influenza A 09/18/2013  . Syncope 09/17/2013  . Diabetes mellitus ([place]) 09/17/2013  . Hypertension   . Hypercholesterolemia   . Chronic lymphocytic leukemia ([place]) 08/01/2011  . Colon cancer ([place]) 08/01/2011    Past Surgical History:  Procedure Laterality Date  . HEMICOLECTOMY    . nasal polyps     S/P nasal surgery    Allergies Hydrocodone-acetaminophen  Family History  Problem Relation Age of Onset  . Ovarian cancer Mother   . Breast cancer Maternal Aunt     Social History Social History   Tobacco Use  . Smoking status: Former Smoker    Packs/day: 0.80    Years: 35.00    Pack years: 28.00    Types: Cigarettes  . Smokeless tobacco: Never  Used  Vaping Use  . Vaping Use: Never used  Substance Use Topics  . Alcohol use: No  . Drug use: No    Review of Systems  Constitutional: No fever/chills Eyes: No visual changes. ENT: No sore throat. Cardiovascular: Denies chest pain. Respiratory: Denies shortness of breath. Positive cough.  Gastrointestinal: No abdominal pain.  No nausea, no vomiting.  No diarrhea.  No constipation. Genitourinary: Negative for dysuria. Musculoskeletal: Negative for back pain. Skin: Negative for rash. Neurological: Negative for focal weakness or numbness. Positive HA s/p fall in ED waiting room.   10-point ROS otherwise negative.  ____________________________________________   PHYSICAL EXAM:  VITAL SIGNS: ED Triage Vitals  Enc Vitals Group     BP 10/22/20 1634 (!) 167/96     Pulse Rate 10/22/20 1634 86     Resp 10/22/20 1634 16     Temp 10/22/20 1631 99.6 F (37.6 C)     Temp Source 10/22/20 1631 Oral     SpO2 10/22/20 1634 98 %    Constitutional: Alert and oriented. Well appearing and in no acute distress. Eyes: Conjunctivae are normal. PERRL. EOMI. Head: Atraumatic. Nose: No congestion/rhinnorhea. Mouth/Throat: Mucous membranes are moist.   Neck: No stridor.  No cervical spine tenderness to palpation. Cardiovascular: Normal rate, regular rhythm. Good peripheral circulation. Grossly normal heart sounds.   Respiratory: Normal  respiratory effort.  No retractions. Lungs CTAB. Gastrointestinal: Soft and nontender. No distention.  Musculoskeletal: No lower extremity tenderness nor edema. No gross deformities of extremities.  Normal range of motion of the bilateral upper and lower extremities.  Neurologic:  Normal speech and language. No gross focal neurologic deficits are appreciated.  Skin:  Skin is warm, dry and intact. No rash noted.   ____________________________________________   LABS (all labs ordered are listed, but only abnormal results are displayed)  Labs Reviewed   COMPREHENSIVE METABOLIC PANEL - Abnormal; Notable for the following components:      Result Value   CO2 20 (*)    Glucose, Bld 257 (*)    BUN 29 (*)    Creatinine, Ser 1.62 (*)    Total Protein 6.3 (*)    GFR, Estimated 43 (*)    All other components within normal limits  CBC WITH DIFFERENTIAL/PLATELET - Abnormal; Notable for the following components:   WBC 69.2 (*)    RBC 3.56 (*)    Hemoglobin 10.2 (*)    HCT 33.7 (*)    RDW 15.7 (*)    Platelets 120 (*)    Neutro Abs 1.4 (*)    Lymphs Abs 67.5 (*)    All other components within normal limits  LACTIC ACID, PLASMA - Abnormal; Notable for the following components:   Lactic Acid, Venous 3.2 (*)    All other components within normal limits  D-DIMER, QUANTITATIVE (NOT AT [facility]) - Abnormal; Notable for the following components:   D-Dimer, Quant 0.65 (*)    All other components within normal limits  C-REACTIVE PROTEIN - Abnormal; Notable for the following components:   CRP 1.2 (*)    All other components within normal limits  POC SARS CORONAVIRUS 2 AG -  ED - Abnormal; Notable for the following components:   SARS Coronavirus 2 Ag POSITIVE (*)    All other components within normal limits  CBG MONITORING, ED - Abnormal; Notable for the following components:   Glucose-Capillary 248 (*)    All other components within normal limits  CBG MONITORING, ED - Abnormal; Notable for the following components:   Glucose-Capillary 240 (*)    All other components within normal limits  CBG MONITORING, ED - Abnormal; Notable for the following components:   Glucose-Capillary 206 (*)    All other components within normal limits  CULTURE, BLOOD (ROUTINE X 2)  CULTURE, BLOOD (ROUTINE X 2)  PROCALCITONIN  FERRITIN  TRIGLYCERIDES  FIBRINOGEN  LACTATE DEHYDROGENASE  LACTIC ACID, PLASMA  CK  MAGNESIUM  PHOSPHORUS  TROPONIN I (HIGH SENSITIVITY)  TROPONIN I (HIGH SENSITIVITY)   ____________________________________________  EKG   EKG  Interpretation  Date/Time:  Tuesday October 22 2020 21:27:30 EST Ventricular Rate:  113 PR Interval:    QRS Duration: 71 QT Interval:  325 QTC Calculation: 446 R Axis:   84 Text Interpretation: Sinus tachycardia Borderline right axis deviation No STEMI Confirmed by Nanda Quinton [phone]) on 10/22/2020 9:40:38 PM       ____________________________________________  RADIOLOGY  DG Chest 2 View  Result Date: 10/22/2020 CLINICAL DATA:  Cough for 2 weeks EXAM: CHEST - 2 VIEW COMPARISON:  None. FINDINGS: The heart size and mediastinal contours are within normal limits. Both lungs are clear. The visualized skeletal structures are unremarkable. IMPRESSION: No active cardiopulmonary disease. Electronically Signed   By: Inez Catalina M.D.   On: 10/22/2020 17:00   CT Head Wo Contrast  Result Date: 10/22/2020 CLINICAL DATA:  Dizziness and  fall. Now with headache and neck trauma. EXAM: CT HEAD WITHOUT CONTRAST CT CERVICAL SPINE WITHOUT CONTRAST TECHNIQUE: Multidetector CT imaging of the head and cervical spine was performed following the standard protocol without intravenous contrast. Multiplanar CT image reconstructions of the cervical spine were also generated. COMPARISON:  CT head 03/25/2019 and 04/11/2019 FINDINGS: CT HEAD FINDINGS Brain: No evidence of acute infarction, hemorrhage, hydrocephalus, extra-axial collection or mass lesion/mass effect. Mild cortical atrophy. Low-attenuation changes in the deep white matter consistent with small vessel ischemia. Vascular: Mild intracranial arterial vascular calcifications. Skull: Calvarium appears intact. No acute depressed skull fractures. Sinuses/Orbits: Paranasal sinuses and mastoid air cells are clear. Other: None. CT CERVICAL SPINE FINDINGS Alignment: Normal alignment. Skull base and vertebrae: Skull base appears intact. No vertebral compression deformities. No focal bone lesion or bone destruction. Bone cortex appears intact. Soft tissues and spinal canal:  No prevertebral soft tissue swelling. No abnormal paraspinal soft tissue mass or infiltration. Disc levels: Intervertebral disc heights are normal. Mild endplate hypertrophic changes. Upper chest: Visualized lung apices are clear. Other: None. IMPRESSION: 1. No acute intracranial abnormalities. Chronic atrophy and small vessel ischemic changes. 2. Normal alignment of the cervical spine. No acute displaced fractures identified. Electronically Signed   By: Lucienne Capers M.D.   On: 10/22/2020 19:56   CT Angio Chest PE W and/or Wo Contrast  Result Date: 10/22/2020 CLINICAL DATA:  Cough for 2 weeks. Dizziness and headache. COVID positive. Pulmonary embolus is suspected with high probability. EXAM: CT ANGIOGRAPHY CHEST WITH CONTRAST TECHNIQUE: Multidetector CT imaging of the chest was performed using the standard protocol during bolus administration of intravenous contrast. Multiplanar CT image reconstructions and MIPs were obtained to evaluate the vascular anatomy. CONTRAST:  31mL OMNIPAQUE IOHEXOL 350 MG/ML SOLN COMPARISON:  CT chest 02/15/2012.  Chest radiograph 10/22/2020 FINDINGS: Cardiovascular: Moderately good opacification of the central and segmental pulmonary arteries. No focal filling defects. No evidence of significant pulmonary embolus. Normal caliber thoracic aorta. Great vessel origins are patent. Aortic and coronary artery calcifications. Normal heart size. No pericardial effusions. Mediastinum/Nodes: Esophagus is decompressed. No significant lymphadenopathy in the chest. Lungs/Pleura: Motion artifact limits examination. Patchy airspace disease in both lungs suggesting pneumonia. No pleural effusions. No pneumothorax. Airways are patent. Upper Abdomen: Several cysts in the liver and right kidney. Similar appearance to previous study. No acute abnormalities demonstrated in the visualized upper abdomen. Musculoskeletal: No chest wall abnormality. No acute or significant osseous findings. Review of the  MIP images confirms the above findings. IMPRESSION: 1. No evidence of significant pulmonary embolus. 2. Patchy airspace disease in both lungs suggesting pneumonia. 3. Aortic atherosclerosis. Aortic Atherosclerosis (ICD10-I70.0). Electronically Signed   By: Lucienne Capers M.D.   On: 10/22/2020 21:51   CT Cervical Spine Wo Contrast  Result Date: 10/22/2020 CLINICAL DATA:  Dizziness and fall. Now with headache and neck trauma. EXAM: CT HEAD WITHOUT CONTRAST CT CERVICAL SPINE WITHOUT CONTRAST TECHNIQUE: Multidetector CT imaging of the head and cervical spine was performed following the standard protocol without intravenous contrast. Multiplanar CT image reconstructions of the cervical spine were also generated. COMPARISON:  CT head 03/25/2019 and 04/11/2019 FINDINGS: CT HEAD FINDINGS Brain: No evidence of acute infarction, hemorrhage, hydrocephalus, extra-axial collection or mass lesion/mass effect. Mild cortical atrophy. Low-attenuation changes in the deep white matter consistent with small vessel ischemia. Vascular: Mild intracranial arterial vascular calcifications. Skull: Calvarium appears intact. No acute depressed skull fractures. Sinuses/Orbits: Paranasal sinuses and mastoid air cells are clear. Other: None. CT CERVICAL SPINE FINDINGS Alignment: Normal alignment.  Skull base and vertebrae: Skull base appears intact. No vertebral compression deformities. No focal bone lesion or bone destruction. Bone cortex appears intact. Soft tissues and spinal canal: No prevertebral soft tissue swelling. No abnormal paraspinal soft tissue mass or infiltration. Disc levels: Intervertebral disc heights are normal. Mild endplate hypertrophic changes. Upper chest: Visualized lung apices are clear. Other: None. IMPRESSION: 1. No acute intracranial abnormalities. Chronic atrophy and small vessel ischemic changes. 2. Normal alignment of the cervical spine. No acute displaced fractures identified. Electronically Signed   By:  Lucienne Capers M.D.   On: 10/22/2020 19:56    ____________________________________________   PROCEDURES  Procedure(s) performed:   Procedures  None  ____________________________________________   INITIAL IMPRESSION / ASSESSMENT AND PLAN / ED COURSE  Pertinent labs & imaging results that were available during my care of the patient were reviewed by me and considered in my medical decision making (see chart for details).   Patient presents emergency department for evaluation initially of cough for the past 2 weeks.  No other infectious symptoms.  No shortness of breath.  Triage vitals show initial temp of 99.6 and no tachycardia.  Blood pressure elevated with normal oxygen saturation.  Repeat vitals after patient fell shows tachycardia but essentially unchanged oxygen saturation and blood pressure.  Patient has no focal neurologic deficits.  No outward sign of head trauma.  I do plan for CT imaging of the head and cervical spine as well as lab work including troponin and will add on EKG.  Patient brought back to the triage area and will be placed on cardiac monitoring.   08:00 PM  CT head and C-spine negative. Plan to follow up labs. EKG pending as well. Will discuss with nursing.   08:45 PM   patient is Covid antigen test does come back positive.  Patient has persistent tachycardia with positive orthostatic vital signs.  He reports feeling wobbly when he was up and standing for his vital signs to be taken.  He is not hypoxemic.  He has mild tachypnea.  Patient is at especially high risk for PE given his ongoing cancer as well as now COVID-19 diagnosis.  Plan for CTA of the chest with clear chest x-ray here and Covid admit labs.   Discussed patient's case with [facility] to request admission. Patient and family (if present) updated with plan. Care transferred to [facility] service.  I reviewed all nursing notes, vitals, pertinent old records, EKGs, labs, imaging (as  available).  ____________________________________________  FINAL CLINICAL IMPRESSION(S) / ED DIAGNOSES  Final diagnoses:  Syncope, unspecified syncope type  COVID-19    MEDICATIONS GIVEN DURING THIS VISIT:  Medications  sodium chloride 0.9 % bolus 500 mL (0 mLs Intravenous Stopped 10/22/20 2234)  iohexol (OMNIPAQUE) 350 MG/ML injection 75 mL (75 mLs Intravenous Contrast Given 10/22/20 2139)  sodium chloride 0.9 % bolus 1,000 mL (1,000 mLs Intravenous New Bag/Given 10/22/20 2307)  dexamethasone (DECADRON) injection 10 mg (10 mg Intravenous Given 10/22/20 2307)    Note:  This document was prepared using Dragon voice recognition software and may include unintentional dictation errors.  Nanda Quinton, MD, [facility] Emergency [facility]    Dorin Stooksbury, Wonda [facility], MD 10/22/20 [phone]

## 2020-10-22 NOTE — ED Notes (Addendum)
Pt states he got up to go to the bathroom, started to feel dizzy while walking, fell to ground, witnessed by bystanders in lobby. C/o headache at this time, denies any other new issues at this time. Aox4, able to answer questions without issue. MD Long called to assess pt.

## 2020-10-22 NOTE — ED Triage Notes (Signed)
Pt arrived via walk in, c/o cough x2 weeks. Denies any other sx, denies any sick contacts.

## 2020-10-23 ENCOUNTER — Observation Stay (HOSPITAL_COMMUNITY): Payer: Medicare Other

## 2020-10-23 DIAGNOSIS — U071 COVID-19: Secondary | ICD-10-CM | POA: Diagnosis present

## 2020-10-23 DIAGNOSIS — E7800 Pure hypercholesterolemia, unspecified: Secondary | ICD-10-CM

## 2020-10-23 DIAGNOSIS — R55 Syncope and collapse: Secondary | ICD-10-CM

## 2020-10-23 DIAGNOSIS — Z79899 Other long term (current) drug therapy: Secondary | ICD-10-CM | POA: Diagnosis not present

## 2020-10-23 DIAGNOSIS — I361 Nonrheumatic tricuspid (valve) insufficiency: Secondary | ICD-10-CM

## 2020-10-23 DIAGNOSIS — N1832 Chronic kidney disease, stage 3b: Secondary | ICD-10-CM | POA: Diagnosis present

## 2020-10-23 DIAGNOSIS — E1122 Type 2 diabetes mellitus with diabetic chronic kidney disease: Secondary | ICD-10-CM | POA: Diagnosis present

## 2020-10-23 DIAGNOSIS — E860 Dehydration: Secondary | ICD-10-CM | POA: Diagnosis present

## 2020-10-23 DIAGNOSIS — E1165 Type 2 diabetes mellitus with hyperglycemia: Secondary | ICD-10-CM | POA: Diagnosis not present

## 2020-10-23 DIAGNOSIS — Z803 Family history of malignant neoplasm of breast: Secondary | ICD-10-CM | POA: Diagnosis not present

## 2020-10-23 DIAGNOSIS — Z7984 Long term (current) use of oral hypoglycemic drugs: Secondary | ICD-10-CM | POA: Diagnosis not present

## 2020-10-23 DIAGNOSIS — Z885 Allergy status to narcotic agent status: Secondary | ICD-10-CM | POA: Diagnosis not present

## 2020-10-23 DIAGNOSIS — Z66 Do not resuscitate: Secondary | ICD-10-CM | POA: Diagnosis present

## 2020-10-23 DIAGNOSIS — C9110 Chronic lymphocytic leukemia of B-cell type not having achieved remission: Secondary | ICD-10-CM | POA: Diagnosis present

## 2020-10-23 DIAGNOSIS — J1282 Pneumonia due to coronavirus disease 2019: Secondary | ICD-10-CM

## 2020-10-23 DIAGNOSIS — E785 Hyperlipidemia, unspecified: Secondary | ICD-10-CM | POA: Diagnosis present

## 2020-10-23 DIAGNOSIS — Z85048 Personal history of other malignant neoplasm of rectum, rectosigmoid junction, and anus: Secondary | ICD-10-CM | POA: Diagnosis not present

## 2020-10-23 DIAGNOSIS — Z8041 Family history of malignant neoplasm of ovary: Secondary | ICD-10-CM | POA: Diagnosis not present

## 2020-10-23 DIAGNOSIS — I10 Essential (primary) hypertension: Secondary | ICD-10-CM

## 2020-10-23 DIAGNOSIS — N179 Acute kidney failure, unspecified: Secondary | ICD-10-CM | POA: Diagnosis present

## 2020-10-23 DIAGNOSIS — Z7982 Long term (current) use of aspirin: Secondary | ICD-10-CM | POA: Diagnosis not present

## 2020-10-23 DIAGNOSIS — Z87891 Personal history of nicotine dependence: Secondary | ICD-10-CM | POA: Diagnosis not present

## 2020-10-23 DIAGNOSIS — I129 Hypertensive chronic kidney disease with stage 1 through stage 4 chronic kidney disease, or unspecified chronic kidney disease: Secondary | ICD-10-CM | POA: Diagnosis present

## 2020-10-23 LAB — CBC WITH DIFFERENTIAL/PLATELET
Abs Immature Granulocytes: 0.09 10*3/uL — ABNORMAL HIGH (ref 0.00–0.07)
Basophils Absolute: 0 10*3/uL (ref 0.0–0.1)
Basophils Relative: 0 %
Eosinophils Absolute: 0 10*3/uL (ref 0.0–0.5)
Eosinophils Relative: 0 %
HCT: 30.7 % — ABNORMAL LOW (ref 39.0–52.0)
Hemoglobin: 9.3 g/dL — ABNORMAL LOW (ref 13.0–17.0)
Immature Granulocytes: 0 %
Lymphocytes Relative: 95 %
Lymphs Abs: 51.7 10*3/uL — ABNORMAL HIGH (ref 0.7–4.0)
MCH: 28.3 pg (ref 26.0–34.0)
MCHC: 30.3 g/dL (ref 30.0–36.0)
MCV: 93.3 fL (ref 80.0–100.0)
Monocytes Absolute: 0.4 10*3/uL (ref 0.1–1.0)
Monocytes Relative: 1 %
Neutro Abs: 2.2 10*3/uL (ref 1.7–7.7)
Neutrophils Relative %: 4 %
Platelets: 89 10*3/uL — ABNORMAL LOW (ref 150–400)
RBC: 3.29 MIL/uL — ABNORMAL LOW (ref 4.22–5.81)
RDW: 15.8 % — ABNORMAL HIGH (ref 11.5–15.5)
WBC: 54.4 10*3/uL (ref 4.0–10.5)
nRBC: 0 % (ref 0.0–0.2)

## 2020-10-23 LAB — COMPREHENSIVE METABOLIC PANEL
ALT: 17 U/L (ref 0–44)
AST: 19 U/L (ref 15–41)
Albumin: 2.9 g/dL — ABNORMAL LOW (ref 3.5–5.0)
Alkaline Phosphatase: 96 U/L (ref 38–126)
Anion gap: 13 (ref 5–15)
BUN: 25 mg/dL — ABNORMAL HIGH (ref 8–23)
CO2: 20 mmol/L — ABNORMAL LOW (ref 22–32)
Calcium: 8.2 mg/dL — ABNORMAL LOW (ref 8.9–10.3)
Chloride: 105 mmol/L (ref 98–111)
Creatinine, Ser: 1.4 mg/dL — ABNORMAL HIGH (ref 0.61–1.24)
GFR, Estimated: 52 mL/min — ABNORMAL LOW (ref >60)
Glucose, Bld: 276 mg/dL — ABNORMAL HIGH (ref 70–99)
Potassium: 4.1 mmol/L (ref 3.5–5.1)
Sodium: 138 mmol/L (ref 135–145)
Total Bilirubin: 0.5 mg/dL (ref 0.3–1.2)
Total Protein: 5.7 g/dL — ABNORMAL LOW (ref 6.5–8.1)

## 2020-10-23 LAB — FERRITIN: Ferritin: 66 ng/mL (ref 24–336)

## 2020-10-23 LAB — GLUCOSE, CAPILLARY: Glucose-Capillary: 200 mg/dL — ABNORMAL HIGH (ref 70–99)

## 2020-10-23 LAB — C-REACTIVE PROTEIN: CRP: 8.4 mg/dL — ABNORMAL HIGH (ref <1.0)

## 2020-10-23 LAB — LACTIC ACID, PLASMA: Lactic Acid, Venous: 1.5 mmol/L (ref 0.5–1.9)

## 2020-10-23 LAB — PHOSPHORUS
Phosphorus: 2.4 mg/dL — ABNORMAL LOW (ref 2.5–4.6)
Phosphorus: 2.9 mg/dL (ref 2.5–4.6)

## 2020-10-23 LAB — HEMOGLOBIN A1C
Hgb A1c MFr Bld: 8.8 % — ABNORMAL HIGH (ref 4.8–5.6)
Mean Plasma Glucose: 205.86 mg/dL

## 2020-10-23 LAB — CBG MONITORING, ED
Glucose-Capillary: 178 mg/dL — ABNORMAL HIGH (ref 70–99)
Glucose-Capillary: 202 mg/dL — ABNORMAL HIGH (ref 70–99)
Glucose-Capillary: 202 mg/dL — ABNORMAL HIGH (ref 70–99)
Glucose-Capillary: 253 mg/dL — ABNORMAL HIGH (ref 70–99)
Glucose-Capillary: 269 mg/dL — ABNORMAL HIGH (ref 70–99)

## 2020-10-23 LAB — ECHOCARDIOGRAM COMPLETE
Area-P 1/2: 3.08 cm2
S' Lateral: 2.28 cm

## 2020-10-23 LAB — MAGNESIUM
Magnesium: 1.5 mg/dL — ABNORMAL LOW (ref 1.7–2.4)
Magnesium: 2 mg/dL (ref 1.7–2.4)

## 2020-10-23 LAB — HEPATITIS B SURFACE ANTIGEN: Hepatitis B Surface Ag: NONREACTIVE

## 2020-10-23 LAB — LACTATE DEHYDROGENASE: LDH: 186 U/L (ref 98–192)

## 2020-10-23 LAB — PROCALCITONIN: Procalcitonin: 17.73 ng/mL

## 2020-10-23 LAB — ABO/RH: ABO/RH(D): O NEG

## 2020-10-23 LAB — STREP PNEUMONIAE URINARY ANTIGEN: Strep Pneumo Urinary Antigen: NEGATIVE

## 2020-10-23 LAB — CK: Total CK: 31 U/L — ABNORMAL LOW (ref 49–397)

## 2020-10-23 LAB — D-DIMER, QUANTITATIVE: D-Dimer, Quant: 0.62 ug/mL-FEU — ABNORMAL HIGH (ref 0.00–0.50)

## 2020-10-23 MED ORDER — ENOXAPARIN SODIUM 40 MG/0.4ML ~~LOC~~ SOLN
40.0000 mg | SUBCUTANEOUS | Status: DC
  Administered 2020-10-24 – 2020-10-25 (×2): 40 mg via SUBCUTANEOUS
  Filled 2020-10-23 (×2): qty 0.4

## 2020-10-23 MED ORDER — INSULIN ASPART 100 UNIT/ML ~~LOC~~ SOLN
0.0000 [IU] | Freq: Three times a day (TID) | SUBCUTANEOUS | Status: DC
  Administered 2020-10-23: 8 [IU] via SUBCUTANEOUS
  Administered 2020-10-23: 3 [IU] via SUBCUTANEOUS
  Administered 2020-10-23: 8 [IU] via SUBCUTANEOUS
  Administered 2020-10-24: 3 [IU] via SUBCUTANEOUS
  Administered 2020-10-24: 8 [IU] via SUBCUTANEOUS
  Administered 2020-10-24: 3 [IU] via SUBCUTANEOUS
  Administered 2020-10-25: 5 [IU] via SUBCUTANEOUS
  Administered 2020-10-25: 11 [IU] via SUBCUTANEOUS
  Filled 2020-10-23: qty 0.15

## 2020-10-23 MED ORDER — INSULIN ASPART 100 UNIT/ML ~~LOC~~ SOLN
0.0000 [IU] | Freq: Every day | SUBCUTANEOUS | Status: DC
  Administered 2020-10-23: 2 [IU] via SUBCUTANEOUS
  Filled 2020-10-23: qty 0.05

## 2020-10-23 MED ORDER — PANTOPRAZOLE SODIUM 40 MG PO TBEC
40.0000 mg | DELAYED_RELEASE_TABLET | Freq: Every day | ORAL | Status: DC
  Administered 2020-10-23 – 2020-10-25 (×3): 40 mg via ORAL
  Filled 2020-10-23 (×3): qty 1

## 2020-10-23 MED ORDER — ASPIRIN EC 81 MG PO TBEC
81.0000 mg | DELAYED_RELEASE_TABLET | Freq: Every morning | ORAL | Status: DC
  Administered 2020-10-23 – 2020-10-25 (×3): 81 mg via ORAL
  Filled 2020-10-23 (×3): qty 1

## 2020-10-23 MED ORDER — SODIUM CHLORIDE 0.9 % IV SOLN
500.0000 mg | INTRAVENOUS | Status: DC
  Administered 2020-10-23: 500 mg via INTRAVENOUS
  Filled 2020-10-23 (×2): qty 500

## 2020-10-23 MED ORDER — METOPROLOL TARTRATE 12.5 MG HALF TABLET
12.5000 mg | ORAL_TABLET | Freq: Two times a day (BID) | ORAL | Status: DC
  Administered 2020-10-23 – 2020-10-25 (×6): 12.5 mg via ORAL
  Filled 2020-10-23 (×6): qty 1

## 2020-10-23 MED ORDER — SODIUM CHLORIDE 0.9 % IV SOLN: INTRAVENOUS | Status: DC

## 2020-10-23 MED ORDER — ONDANSETRON HCL 4 MG PO TABS: 4.0000 mg | ORAL_TABLET | Freq: Four times a day (QID) | ORAL | Status: DC | PRN

## 2020-10-23 MED ORDER — ATORVASTATIN CALCIUM 40 MG PO TABS
40.0000 mg | ORAL_TABLET | Freq: Every evening | ORAL | Status: DC
  Administered 2020-10-23 – 2020-10-24 (×2): 40 mg via ORAL
  Filled 2020-10-23 (×2): qty 1

## 2020-10-23 MED ORDER — AMLODIPINE BESYLATE 5 MG PO TABS
5.0000 mg | ORAL_TABLET | Freq: Every morning | ORAL | Status: DC
  Administered 2020-10-23 – 2020-10-25 (×3): 5 mg via ORAL
  Filled 2020-10-23 (×3): qty 1

## 2020-10-23 MED ORDER — ONDANSETRON HCL 4 MG/2ML IJ SOLN: 4.0000 mg | Freq: Four times a day (QID) | INTRAMUSCULAR | Status: DC | PRN

## 2020-10-23 MED ORDER — ACETAMINOPHEN 325 MG PO TABS: 650.0000 mg | ORAL_TABLET | Freq: Four times a day (QID) | ORAL | Status: DC | PRN

## 2020-10-23 MED ORDER — IBRUTINIB 420 MG PO TABS
1.0000 | ORAL_TABLET | Freq: Every day | ORAL | Status: DC
  Administered 2020-10-23 – 2020-10-25 (×3): 1 via ORAL

## 2020-10-23 MED ORDER — SODIUM CHLORIDE 0.9% FLUSH
3.0000 mL | Freq: Two times a day (BID) | INTRAVENOUS | Status: DC
  Administered 2020-10-23 – 2020-10-25 (×6): 3 mL via INTRAVENOUS

## 2020-10-23 MED ORDER — MAGNESIUM SULFATE 2 GM/50ML IV SOLN
2.0000 g | Freq: Once | INTRAVENOUS | Status: AC
  Administered 2020-10-23: 2 g via INTRAVENOUS
  Filled 2020-10-23: qty 50

## 2020-10-23 MED ORDER — ENOXAPARIN SODIUM 30 MG/0.3ML ~~LOC~~ SOLN
30.0000 mg | SUBCUTANEOUS | Status: DC
  Administered 2020-10-23: 30 mg via SUBCUTANEOUS
  Filled 2020-10-23: qty 0.3

## 2020-10-23 MED ORDER — K PHOS MONO-SOD PHOS DI & MONO 155-852-130 MG PO TABS
250.0000 mg | ORAL_TABLET | Freq: Three times a day (TID) | ORAL | Status: DC
  Administered 2020-10-23 (×2): 250 mg via ORAL
  Filled 2020-10-23 (×4): qty 1

## 2020-10-23 MED ORDER — ALBUTEROL SULFATE HFA 108 (90 BASE) MCG/ACT IN AERS
2.0000 | INHALATION_SPRAY | Freq: Four times a day (QID) | RESPIRATORY_TRACT | Status: DC
  Administered 2020-10-23 – 2020-10-24 (×7): 2 via RESPIRATORY_TRACT
  Filled 2020-10-23: qty 6.7

## 2020-10-23 MED ORDER — SODIUM CHLORIDE 0.9 % IV SOLN
2.0000 g | INTRAVENOUS | Status: DC
  Administered 2020-10-23: 2 g via INTRAVENOUS
  Filled 2020-10-23: qty 20

## 2020-10-23 MED ORDER — DEXAMETHASONE SODIUM PHOSPHATE 10 MG/ML IJ SOLN
6.0000 mg | INTRAMUSCULAR | Status: DC
  Administered 2020-10-24: 6 mg via INTRAVENOUS
  Filled 2020-10-23: qty 1

## 2020-10-23 MED ORDER — GUAIFENESIN-DM 100-10 MG/5ML PO SYRP: 10.0000 mL | ORAL_SOLUTION | ORAL | Status: DC | PRN

## 2020-10-23 NOTE — Evaluation (Signed)
Physical Therapy Evaluation Patient Details Name: [name] MRN: [id number] DOB: December 06, 1940 Today's Date: 10/23/2020   History of Present Illness  80 y.o. male with medical history significant of CLL pancytopenia and lymphocytosis and relapsed disease, rectal cancer diagnosed in 2009, DM2, HLD ,HTN. Pt presented with cough for the past 2 wks, + COVID.  Clinical Impression  Patient ambulated in room on RA without assistance, no balance deficits noted. Patient resides with wife. No further PT needs at this time. Please reconsult if there are changes in patient's status and requires skilled PT/  PT will sign off     Follow Up Recommendations No PT follow up    Equipment Recommendations  None recommended by PT    Recommendations for Other Services       Precautions / Restrictions Precautions Precaution Comments: had fall in waiting room      Mobility  Bed Mobility Overal bed mobility: Independent                  Transfers Overall transfer level: Independent                  Ambulation/Gait Ambulation/Gait assistance: Independent Gait Distance (Feet): 50 Feet Assistive device: None Gait Pattern/deviations: WFL(Within Functional Limits)   Gait velocity interpretation: >2.62 ft/sec, indicative of community ambulatory    Stairs            Wheelchair Mobility    Modified Rankin (Stroke Patients Only)       Balance Overall balance assessment: No apparent balance deficits (not formally assessed)                                           Pertinent Vitals/Pain      Home Living Family/patient expects to be discharged to:: Private residence Living Arrangements: Spouse/significant other Available Help at Discharge: Family Type of Home: House Home Access: Stairs to enter   Technical brewer of Steps: 1 Home Layout: Two level;Able to live on main level with bedroom/bathroom Home Equipment: None Additional Comments:  loft is upstairs    Prior Function Level of Independence: Independent               Hand Dominance        Extremity/Trunk Assessment   Upper Extremity Assessment Upper Extremity Assessment: Overall WFL for tasks assessed    Lower Extremity Assessment Lower Extremity Assessment: Overall WFL for tasks assessed    Cervical / Trunk Assessment Cervical / Trunk Assessment: Normal  Communication   Communication: No difficulties  Cognition Arousal/Alertness: Awake/alert Behavior During Therapy: WFL for tasks assessed/performed Overall Cognitive Status: Within Functional Limits for tasks assessed                                        General Comments      Exercises     Assessment/Plan    PT Assessment Patent does not need any further PT services  PT Problem List         PT Treatment Interventions      PT Goals (Current goals can be found in the Care Plan section)  Acute Rehab PT Goals Patient Stated Goal: home today PT Goal Formulation: All assessment and education complete, DC therapy Potential to Achieve Goals: Good    Frequency  Barriers to discharge        Co-evaluation               AM-PAC PT "6 Clicks" Mobility  Outcome Measure Help needed turning from your back to your side while in a flat bed without using bedrails?: None Help needed moving from lying on your back to sitting on the side of a flat bed without using bedrails?: None Help needed moving to and from a bed to a chair (including a wheelchair)?: None Help needed standing up from a chair using your arms (e.g., wheelchair or bedside chair)?: None Help needed to walk in hospital room?: None Help needed climbing 3-5 steps with a railing? : None 6 Click Score: 24    End of Session   Activity Tolerance: Patient tolerated treatment well Patient left: in bed Nurse Communication: Mobility status PT Visit Diagnosis: History of falling (Z91.81)    Time:  6168-3729 PT Time Calculation (min) (ACUTE ONLY): 22 min   Charges:   PT Evaluation $PT Eval Low Complexity: [place] Pager [phone] Office [phone]   Claretha Cooper 10/23/2020, 1:10 PM

## 2020-10-23 NOTE — Evaluation (Signed)
Occupational Therapy Evaluation Patient Details Name: [name] MRN: [id number] DOB: [birth year] Today's Date: 10/23/2020    History of Present Illness 80 y.o. male with medical history significant of CLL pancytopenia and lymphocytosis and relapsed disease, rectal cancer diagnosed in 2009, DM2, HLD ,HTN. Pt presented with cough for the past 2 wks, + COVID.   Clinical Impression   Patient lives at home with spouse in [facility], can stay main level. Patient is currently I with self care and functional mobility/transfers, at his baseline. VSS on room air saturation at 99% after ambulation in room. Will sign off, please re-consult if new needs arise.    Follow Up Recommendations  No OT follow up    Equipment Recommendations  None recommended by OT       Precautions / Restrictions Precautions Precaution Comments: had fall in waiting room Restrictions Weight Bearing Restrictions: No      Mobility Bed Mobility Overal bed mobility: Independent                  Transfers Overall transfer level: Independent                    Balance Overall balance assessment: No apparent balance deficits (not formally assessed)                                         ADL either performed or assessed with clinical judgement   ADL Overall ADL's : Independent;At baseline                                       General ADL Comments: patient able to perform LB dressing, functional transfers and ambulation in room without assistance                  Pertinent Vitals/Pain Pain Assessment: No/denies pain     Hand Dominance  (did not specify)   Extremity/Trunk Assessment Upper Extremity Assessment Upper Extremity Assessment: Overall WFL for tasks assessed   Lower Extremity Assessment Lower Extremity Assessment: Defer to PT evaluation   Cervical / Trunk Assessment Cervical / Trunk Assessment: Normal   Communication  Communication Communication: No difficulties   Cognition Arousal/Alertness: Awake/alert Behavior During Therapy: WFL for tasks assessed/performed Overall Cognitive Status: Within Functional Limits for tasks assessed                                     General Comments  VSS on room air            Home Living Family/patient expects to be discharged to:: Private residence Living Arrangements: Spouse/significant other Available Help at Discharge: Family Type of Home: House Home Access: Stairs to enter Technical brewer of Steps: 1   Home Layout: Two level;Able to live on main level with bedroom/bathroom     Bathroom Shower/Tub: Walk-in shower         Home Equipment: None   Additional Comments: loft is upstairs      Prior Functioning/Environment Level of Independence: Independent                 OT Problem List: Decreased activity tolerance         OT Goals(Current goals can be found in the  care plan section) Acute Rehab OT Goals Patient Stated Goal: home today OT Goal Formulation: All assessment and education complete, DC therapy                AM-PAC OT "6 Clicks" Daily Activity     Outcome Measure Help from another person eating meals?: None Help from another person taking care of personal grooming?: None Help from another person toileting, which includes using toliet, bedpan, or urinal?: None Help from another person bathing (including washing, rinsing, drying)?: None Help from another person to put on and taking off regular upper body clothing?: None Help from another person to put on and taking off regular lower body clothing?: None 6 Click Score: 24   End of Session Nurse Communication: Mobility status  Activity Tolerance: Patient tolerated treatment well Patient left: in bed;with call bell/phone within reach  OT Visit Diagnosis: History of falling (Z91.81)                Time: 6754-4920 OT Time Calculation (min): 13  min Charges:  OT General Charges $OT Visit: 1 Visit OT Evaluation $OT Eval Low Complexity: 1 Low  Delbert Phenix OT OT pager: [place] 10/23/2020, 8:09 AM

## 2020-10-23 NOTE — Progress Notes (Signed)
  Echocardiogram 2D Echocardiogram has been performed.  [name] 10/23/2020, 8:53 AM

## 2020-10-23 NOTE — Progress Notes (Signed)
Carotid artery duplex has been completed. Preliminary results can be found in CV Proc through chart review.   10/23/20 12:41 PM [name] RVT

## 2020-10-23 NOTE — Progress Notes (Signed)
PROGRESS NOTE    [name]  IEP:[id number] DOB: 10/29/1940 DOA: 10/22/2020 PCP: Edward Dus, MD    Brief Narrative:  [name] was admitted to the hospital with a working diagnosis of SARS COVID-19 viral pneumonia.  80 year old male past medical history for CLL, pancytopenia, leukocytosis, rectal cancer, type 2 diabetes mellitus, dyslipidemia and hypertension.  He reported 2 weeks of coughing, associated with generalized weakness.  While waiting in the emergency department he developed a syncope episode. On his initial physical examination blood pressure 167/96, heart rate 86, respirate 16, temperature 99.6, oxygen saturation 98%.  His lungs were clear to auscultation bilaterally, heart S1-S2, present rhythmic, soft abdomen, no lower extremity edema.  Chest radiograph with very faint infiltrate interstitial at the left base. CT chest negative for pulmonary embolism, bilateral basal groundglass opacities.  Assessment & Plan:   Principal Problem:   Pneumonia due to COVID-19 virus Active Problems:   Syncope   Hypertension   Hypercholesterolemia   Diabetes mellitus ([place])   1.  SARS COVID-19 viral pneumonia.  RR: 16  Pulse oxymetry: 98%  Fi02: 21% room air.    [place]    10/22/20 1933 10/22/20 2130 10/23/20 0601  DDIMER 0.65*  --  0.62*  FERRITIN 50  --  66  LDH  --  184 186  CRP 1.2*  --  8.4*    Lab Results  Component Value Date   [place] NEGATIVE 04/11/2019   [place] NEGATIVE 03/25/2019    Patient continue to have elevated inflammatory markers. Continue medical therapy with systemic steroids and remdesivir.  Airway clearing techniques, bronchodilators and antitussive agents.   Discontinue IV fluids.  Ruled out bacterial pneumonia, discontinue antibiotics.   2, Dyslipidemia. Continue with statin therapy.  3. HTN. Continue blood pressure control with amlodipine. Follow up on echocardiogram./   4. T2DM. Continue glucose  control and monitoring with insulin sliding scale.   5. CLL. Follow as outpatient. Cell count stable 54 wbc   Patient continue to be at high risk for worsening pneumonia   Status is: Inpatient  Remains inpatient appropriate because:IV treatments appropriate due to intensity of illness or inability to take PO   Dispo: The patient is from: Home              Anticipated d/c is to: Home              Anticipated d/c date is: 3 days              Patient currently is not medically stable to d/c.   Difficult to place patient No   DVT prophylaxis: Enoxaparin   Code Status:   full  Family Communication:  No family at the bedside       Subjective: Patient is feeling better but not yet back to baseline, continue to have dyspnea and generalized weakness.   Objective: Vitals:   10/23/20 0830 10/23/20 1030 10/23/20 1130 10/23/20 1400  BP: (!) 147/71 135/64 136/61 110/67  Pulse: 92 79 73 71  Resp: 15 15 16 16   Temp: (!) 97.5 F (36.4 C)     TempSrc: Oral     SpO2: 100% 100% 100% 100%    Intake/Output Summary (Last 24 hours) at 10/23/2020 1712 Last data filed at 10/23/2020 6606 Gross per 24 hour  Intake 1750 ml  Output -  Net 1750 ml   There were no vitals filed for this visit.  Examination:   General: Not in pain or dyspnea, deconditioned  Neurology: Awake and alert, non focal  E ENT: mild pallor, no icterus, oral mucosa moist Cardiovascular: No JVD. S1-S2 present, rhythmic, no gallops, rubs, or murmurs. No lower extremity edema. Pulmonary: positive breath sounds bilaterally,with no wheezing, rhonchi or rales. Gastrointestinal. Abdomen soft and non tender Skin. No rashes Musculoskeletal: no joint deformities     Data Reviewed: I have personally reviewed following labs and imaging studies  CBC: Recent Labs  Lab 10/22/20 1932 10/23/20 0601  WBC 69.2* 54.4*  NEUTROABS 1.4* 2.2  HGB 10.2* 9.3*  HCT 33.7* 30.7*  MCV 94.7 93.3  PLT 120* 89*   Basic Metabolic  Panel: Recent Labs  Lab 10/22/20 1932 10/22/20 2339 10/23/20 0601  NA 136  --  138  K 4.6  --  4.1  CL 104  --  105  CO2 20*  --  20*  GLUCOSE 257*  --  276*  BUN 29*  --  25*  CREATININE 1.62*  --  1.40*  CALCIUM 9.0  --  8.2*  MG  --  1.5* 2.0  PHOS  --  2.4* 2.9   GFR: CrCl cannot be calculated (Unknown ideal weight.). Liver Function Tests: Recent Labs  Lab 10/22/20 1932 10/23/20 0601  AST 28 19  ALT 20 17  ALKPHOS 115 96  BILITOT 0.7 0.5  PROT 6.3* 5.7*  ALBUMIN 3.5 2.9*   No results for input(s): LIPASE, AMYLASE in the last 168 hours. No results for input(s): AMMONIA in the last 168 hours. Coagulation Profile: No results for input(s): INR, PROTIME in the last 168 hours. Cardiac Enzymes: Recent Labs  Lab 10/22/20 2339  CKTOTAL 31*   BNP (last 3 results) No results for input(s): PROBNP in the last 8760 hours. HbA1C: Recent Labs    10/23/20 0002  HGBA1C 8.8*   CBG: Recent Labs  Lab 10/22/20 2225 10/23/20 0024 10/23/20 0042 10/23/20 0808 10/23/20 1212  GLUCAP 206* 202* 202* 253* 269*   Lipid Profile: Recent Labs    10/22/20 1933  TRIG 87   Thyroid Function Tests: No results for input(s): TSH, T4TOTAL, FREET4, T3FREE, THYROIDAB in the last 72 hours. Anemia Panel: Recent Labs    10/22/20 1933 10/23/20 0601  FERRITIN 50 38      Radiology Studies: I have reviewed all of the imaging during this hospital visit personally     Scheduled Meds: . albuterol  2 puff Inhalation Q6H  . amLODipine  5 mg Oral q morning - 10a  . aspirin EC  81 mg Oral q morning - 10a  . atorvastatin  40 mg Oral QPM  . [START ON 10/24/2020] dexamethasone (DECADRON) injection  6 mg Intravenous Q24H  . [START ON 10/24/2020] enoxaparin (LOVENOX) injection  40 mg Subcutaneous Q24H  . ibrutinib  1 tablet Oral Daily  . insulin aspart  0-15 Units Subcutaneous TID WC  . insulin aspart  0-5 Units Subcutaneous QHS  . metoprolol tartrate  12.5 mg Oral BID  .  pantoprazole  40 mg Oral Daily  . phosphorus  250 mg Oral Q8H  . sodium chloride flush  3 mL Intravenous Q12H   Continuous Infusions: . sodium chloride 75 mL/hr at 10/23/20 0210  . azithromycin Stopped (10/23/20 0432)  . cefTRIAXone (ROCEPHIN)  IV Stopped (10/23/20 0241)  . [START ON 10/24/2020] remdesivir 100 mg in NS 100 mL       LOS: 0 days        Edward Cortez Gerome Apley, MD

## 2020-10-23 NOTE — ED Notes (Signed)
Pt given meal tray.

## 2020-10-24 LAB — D-DIMER, QUANTITATIVE: D-Dimer, Quant: 0.64 ug/mL-FEU — ABNORMAL HIGH (ref 0.00–0.50)

## 2020-10-24 LAB — COMPREHENSIVE METABOLIC PANEL
ALT: 15 U/L (ref 0–44)
AST: 17 U/L (ref 15–41)
Albumin: 2.9 g/dL — ABNORMAL LOW (ref 3.5–5.0)
Alkaline Phosphatase: 78 U/L (ref 38–126)
Anion gap: 7 (ref 5–15)
BUN: 23 mg/dL (ref 8–23)
CO2: 22 mmol/L (ref 22–32)
Calcium: 8.2 mg/dL — ABNORMAL LOW (ref 8.9–10.3)
Chloride: 111 mmol/L (ref 98–111)
Creatinine, Ser: 1.5 mg/dL — ABNORMAL HIGH (ref 0.61–1.24)
GFR, Estimated: 48 mL/min — ABNORMAL LOW (ref >60)
Glucose, Bld: 218 mg/dL — ABNORMAL HIGH (ref 70–99)
Potassium: 3.9 mmol/L (ref 3.5–5.1)
Sodium: 140 mmol/L (ref 135–145)
Total Bilirubin: 0.8 mg/dL (ref 0.3–1.2)
Total Protein: 5.3 g/dL — ABNORMAL LOW (ref 6.5–8.1)

## 2020-10-24 LAB — GLUCOSE, CAPILLARY
Glucose-Capillary: 168 mg/dL — ABNORMAL HIGH (ref 70–99)
Glucose-Capillary: 173 mg/dL — ABNORMAL HIGH (ref 70–99)
Glucose-Capillary: 287 mg/dL — ABNORMAL HIGH (ref 70–99)
Glucose-Capillary: 182 mg/dL — ABNORMAL HIGH (ref 70–99)

## 2020-10-24 LAB — FERRITIN: Ferritin: 91 ng/mL (ref 24–336)

## 2020-10-24 LAB — C-REACTIVE PROTEIN: CRP: 10.9 mg/dL — ABNORMAL HIGH (ref <1.0)

## 2020-10-24 LAB — LEGIONELLA PNEUMOPHILA SEROGP 1 UR AG: L. pneumophila Serogp 1 Ur Ag: NEGATIVE

## 2020-10-24 MED ORDER — METHYLPREDNISOLONE SODIUM SUCC 40 MG IJ SOLR
40.0000 mg | Freq: Two times a day (BID) | INTRAMUSCULAR | Status: DC
  Administered 2020-10-24 – 2020-10-25 (×2): 40 mg via INTRAVENOUS
  Filled 2020-10-24 (×2): qty 1

## 2020-10-24 MED ORDER — SODIUM CHLORIDE 0.9 % IV SOLN
INTRAVENOUS | Status: DC | PRN
  Administered 2020-10-24: 1000 mL via INTRAVENOUS

## 2020-10-24 MED ORDER — ZOLPIDEM TARTRATE 5 MG PO TABS
5.0000 mg | ORAL_TABLET | Freq: Once | ORAL | Status: AC
  Administered 2020-10-24: 5 mg via ORAL
  Filled 2020-10-24: qty 1

## 2020-10-24 MED ORDER — ALBUTEROL SULFATE HFA 108 (90 BASE) MCG/ACT IN AERS: 2.0000 | INHALATION_SPRAY | RESPIRATORY_TRACT | Status: DC | PRN

## 2020-10-24 NOTE — Plan of Care (Signed)
  Problem: Education: Goal: Knowledge of General Education information will improve Description: Including pain rating scale, medication(s)/side effects and non-pharmacologic comfort measures Outcome: Progressing   Problem: Health Behavior/Discharge Planning: Goal: Ability to manage health-related needs will improve Outcome: Progressing   Problem: Clinical Measurements: Goal: Ability to maintain clinical measurements within normal limits will improve Outcome: Progressing Goal: Will remain free from infection Outcome: Progressing Goal: Diagnostic test results will improve Outcome: Progressing Goal: Respiratory complications will improve Outcome: Progressing Goal: Cardiovascular complication will be avoided Outcome: Progressing   Problem: Activity: Goal: Risk for activity intolerance will decrease Outcome: Progressing   Problem: Nutrition: Goal: Adequate nutrition will be maintained Outcome: Progressing   Problem: Coping: Goal: Level of anxiety will decrease Outcome: Progressing   Problem: Elimination: Goal: Will not experience complications related to bowel motility Outcome: Progressing Goal: Will not experience complications related to urinary retention Outcome: Progressing   Problem: Pain Managment: Goal: General experience of comfort will improve Outcome: Progressing   Problem: Safety: Goal: Ability to remain free from injury will improve Outcome: Progressing   Problem: Skin Integrity: Goal: Risk for impaired skin integrity will decrease Outcome: Progressing   Problem: Education: Goal: Knowledge of risk factors and measures for prevention of condition will improve Outcome: Progressing   Problem: Respiratory: Goal: Will maintain a patent airway Outcome: Progressing Goal: Complications related to the disease process, condition or treatment will be avoided or minimized Outcome: Progressing   

## 2020-10-24 NOTE — Plan of Care (Signed)
  Problem: Education: Goal: Knowledge of General Education information will improve Description: Including pain rating scale, medication(s)/side effects and non-pharmacologic comfort measures Outcome: Progressing   Problem: Health Behavior/Discharge Planning: Goal: Ability to manage health-related needs will improve Outcome: Progressing   Problem: Clinical Measurements: Goal: Will remain free from infection Outcome: Progressing Goal: Diagnostic test results will improve Outcome: Progressing   Problem: Pain Managment: Goal: General experience of comfort will improve Outcome: Progressing   Problem: Safety: Goal: Ability to remain free from injury will improve Outcome: Progressing   Problem: Skin Integrity: Goal: Risk for impaired skin integrity will decrease Outcome: Progressing   Problem: Education: Goal: Knowledge of risk factors and measures for prevention of condition will improve Outcome: Progressing   Problem: Respiratory: Goal: Complications related to the disease process, condition or treatment will be avoided or minimized Outcome: Progressing

## 2020-10-24 NOTE — Progress Notes (Addendum)
PROGRESS NOTE    [name]  PIR:[id number] DOB: 17-Mar-1941 DOA: 10/22/2020 PCP: [name], MD    Brief Narrative:  [name] was admitted to the hospital with a working diagnosis of SARS COVID-19 viral pneumonia.  80 year old male past medical history for CLL, pancytopenia, leukocytosis, rectal cancer, type 2 diabetes mellitus, dyslipidemia and hypertension.  He reported 2 weeks of coughing, associated with generalized weakness.  While waiting in the emergency department he developed a syncope episode. On his initial physical examination blood pressure 167/96, heart rate 86, respirate 16, temperature 99.6, oxygen saturation 98%.  His lungs were clear to auscultation bilaterally, heart S1-S2, present rhythmic, soft abdomen, no lower extremity edema.  Chest radiograph with very faint infiltrate interstitial at the left base. CT chest negative for pulmonary embolism, bilateral basal groundglass opacities.    Assessment & Plan:   Principal Problem:   Pneumonia due to COVID-19 virus Active Problems:   Syncope   Hypertension   Hypercholesterolemia   Diabetes mellitus ([place])  Ruled out bacterial pneumonia/ sepsis  1.  SARS COVID-19 viral pneumonia  RR: 17  Pulse oxymetry: 100%  Fi02: 21% room air   COVID-19 Labs  Recent Labs    10/22/20 1933 10/22/20 2130 10/23/20 0601 10/24/20 0412  DDIMER 0.65*  --  0.62* 0.64*  FERRITIN 50  --  66 91  LDH  --  184 186  --   CRP 1.2*  --  8.4* 10.9*    Lab Results  Component Value Date   [place]ARSCOV2NAA NEGATIVE 04/11/2019   [place] NEGATIVE 03/25/2019    Elevation in CRP up to 10.9, but not increase in oxygen requirements or worsening dyspnea.   Tolerating well medical therapy with Remdesivir #2. Change dexamethasone to methylprednisolone 40 mg IV q12.  Continue with airway clearing techniques, bronchodilators and antitussive agents.   If inflammatory markers trending down in am, plan to discharge patient home.    2, Dyslipidemia. On statin.   3. HTN. On amlodipine and metoprolol for blood pressure control.   4. T2DM. Fasting glucose is 218, capillary 168 and 173.  Continue with insulin sliding scale for glucose cover and monitoring. Patient is tolerating po well.   5. CLL. Chronic leukocytosis. Follow with hematology as outpatient.  Continue with ibrutinib.   6. Syncope. Multifactorial. Echocardiogram with preserved LV.   Status is: Inpatient  Remains inpatient appropriate because:IV treatments appropriate due to intensity of illness or inability to take PO   Dispo: The patient is from: Home              Anticipated d/c is to: Home              Anticipated d/c date is: 1 day              Patient currently is not medically stable to d/c.  Possible dc home in am, if improvement in inflammatory markers.    Difficult to place patient No    DVT prophylaxis: Enoxaparin   Code Status:   full  Family Communication:  I spoke over the phone with the patient's wife about patient's  condition, plan of care, prognosis and all questions were addressed.    Subjective: Patient is feeling better, had some nausea and vomiting this am , improvement in dyspnea, no chest pain,   Objective: Vitals:   10/23/20 2239 10/24/20 0236 10/24/20 0500 10/24/20 0637  BP: 139/67 (!) 125/59  140/67  Pulse: 76 89  74  Resp: 20 19  17  Temp: 97.9 F (36.6 C) 98 F (36.7 C)  97.6 F (36.4 C)  TempSrc: Oral Oral  Oral  SpO2: 100% 100%  100%  Weight:   57.1 kg   Height:        Intake/Output Summary (Last 24 hours) at 10/24/2020 1403 Last data filed at 10/24/2020 1301 Gross per 24 hour  Intake 131.59 ml  Output -  Net 131.59 ml   Filed Weights   10/23/20 1847 10/24/20 0500  Weight: 56.7 kg 57.1 kg    Examination:   General: Not in pain or dyspnea, deconditioned  Neurology: Awake and alert, non focal  E ENT: mild pallor, no icterus, oral mucosa moist Cardiovascular: No JVD. S1-S2 present,  rhythmic, no gallops, rubs, or murmurs. No lower extremity edema. Pulmonary: positive breath sounds bilaterally, with no wheezing,  Gastrointestinal. Abdomen soft and non tender Skin. No rashes Musculoskeletal: no joint deformities     Data Reviewed: I have personally reviewed following labs and imaging studies  CBC: Recent Labs  Lab 10/22/20 1932 10/23/20 0601  WBC 69.2* 54.4*  NEUTROABS 1.4* 2.2  HGB 10.2* 9.3*  HCT 33.7* 30.7*  MCV 94.7 93.3  PLT 120* 89*   Basic Metabolic Panel: Recent Labs  Lab 10/22/20 1932 10/22/20 2339 10/23/20 0601 10/24/20 0412  NA 136  --  138 140  K 4.6  --  4.1 3.9  CL 104  --  105 111  CO2 20*  --  20* 22  GLUCOSE 257*  --  276* 218*  BUN 29*  --  25* 23  CREATININE 1.62*  --  1.40* 1.50*  CALCIUM 9.0  --  8.2* 8.2*  MG  --  1.5* 2.0  --   PHOS  --  2.4* 2.9  --    GFR: Estimated Creatinine Clearance: 33.3 mL/min (A) (by C-G formula based on SCr of 1.5 mg/dL (H)). Liver Function Tests: Recent Labs  Lab 10/22/20 1932 10/23/20 0601 10/24/20 0412  AST 28 19 17   ALT 20 17 15   ALKPHOS 115 96 78  BILITOT 0.7 0.5 0.8  PROT 6.3* 5.7* 5.3*  ALBUMIN 3.5 2.9* 2.9*   No results for input(s): LIPASE, AMYLASE in the last 168 hours. No results for input(s): AMMONIA in the last 168 hours. Coagulation Profile: No results for input(s): INR, PROTIME in the last 168 hours. Cardiac Enzymes: Recent Labs  Lab 10/22/20 2339  CKTOTAL 31*   BNP (last 3 results) No results for input(s): PROBNP in the last 8760 hours. HbA1C: Recent Labs    10/23/20 0002  HGBA1C 8.8*   CBG: Recent Labs  Lab 10/23/20 1212 10/23/20 1706 10/23/20 2247 10/24/20 0758 10/24/20 1117  GLUCAP 269* 178* 200* 168* 173*   Lipid Profile: Recent Labs    10/22/20 1933  TRIG 87   Thyroid Function Tests: No results for input(s): TSH, T4TOTAL, FREET4, T3FREE, THYROIDAB in the last 72 hours. Anemia Panel: Recent Labs    10/23/20 0601 10/24/20 0412   FERRITIN 66 91      Radiology Studies: I have reviewed all of the imaging during this hospital visit personally     Scheduled Meds: . albuterol  2 puff Inhalation Q6H  . amLODipine  5 mg Oral q morning - 10a  . aspirin EC  81 mg Oral q morning - 10a  . atorvastatin  40 mg Oral QPM  . dexamethasone (DECADRON) injection  6 mg Intravenous Q24H  . enoxaparin (LOVENOX) injection  40 mg Subcutaneous Q24H  . ibrutinib  1 tablet Oral Daily  . insulin aspart  0-15 Units Subcutaneous TID WC  . insulin aspart  0-5 Units Subcutaneous QHS  . metoprolol tartrate  12.5 mg Oral BID  . pantoprazole  40 mg Oral Daily  . sodium chloride flush  3 mL Intravenous Q12H   Continuous Infusions: . sodium chloride Stopped (10/24/20 1301)  . remdesivir 100 mg in NS 100 mL Stopped (10/24/20 0953)     LOS: 1 day        Cheralyn Oliver Gerome Apley, MD

## 2020-10-25 LAB — COMPREHENSIVE METABOLIC PANEL
ALT: 20 U/L (ref 0–44)
AST: 22 U/L (ref 15–41)
Albumin: 3.1 g/dL — ABNORMAL LOW (ref 3.5–5.0)
Alkaline Phosphatase: 81 U/L (ref 38–126)
Anion gap: 8 (ref 5–15)
BUN: 25 mg/dL — ABNORMAL HIGH (ref 8–23)
CO2: 23 mmol/L (ref 22–32)
Calcium: 8.8 mg/dL — ABNORMAL LOW (ref 8.9–10.3)
Chloride: 108 mmol/L (ref 98–111)
Creatinine, Ser: 1.14 mg/dL (ref 0.61–1.24)
GFR, Estimated: >60 mL/min (ref >60)
Glucose, Bld: 263 mg/dL — ABNORMAL HIGH (ref 70–99)
Potassium: 4.5 mmol/L (ref 3.5–5.1)
Sodium: 139 mmol/L (ref 135–145)
Total Bilirubin: 1 mg/dL (ref 0.3–1.2)
Total Protein: 5.7 g/dL — ABNORMAL LOW (ref 6.5–8.1)

## 2020-10-25 LAB — C-REACTIVE PROTEIN: CRP: 6.1 mg/dL — ABNORMAL HIGH (ref <1.0)

## 2020-10-25 LAB — GLUCOSE, CAPILLARY
Glucose-Capillary: 224 mg/dL — ABNORMAL HIGH (ref 70–99)
Glucose-Capillary: 324 mg/dL — ABNORMAL HIGH (ref 70–99)

## 2020-10-25 LAB — D-DIMER, QUANTITATIVE: D-Dimer, Quant: 0.6 ug/mL-FEU — ABNORMAL HIGH (ref 0.00–0.50)

## 2020-10-25 LAB — FERRITIN: Ferritin: 115 ng/mL (ref 24–336)

## 2020-10-25 MED ORDER — ZOLPIDEM TARTRATE 5 MG PO TABS
5.0000 mg | ORAL_TABLET | Freq: Once | ORAL | Status: AC
  Administered 2020-10-25: 5 mg via ORAL
  Filled 2020-10-25: qty 1

## 2020-10-25 MED ORDER — GUAIFENESIN-DM 100-10 MG/5ML PO SYRP: 5.0000 mL | ORAL_SOLUTION | Freq: Four times a day (QID) | ORAL | 0 refills | Status: DC | PRN

## 2020-10-25 NOTE — Care Management Important Message (Signed)
Important Message  Patient Details IM Letter placed in Patient's door [employer]addy. Name: Rodriguez Aguinaldo MRN: [id number] Date of Birth: [birth year]   Medicare Important Message Given:  Yes     Kerin Salen 10/25/2020, 12:59 PM

## 2020-10-25 NOTE — Discharge Summary (Shared)
Pt d/c home with wife.  AVS reviewed w/ pt. Instructed to quarantine 10 more days per MD instructions. All patient belongings w/ pt at d/c including cell phone & home meds

## 2020-10-25 NOTE — Discharge Summary (Signed)
Physician Discharge Summary  Hulet Ehrmann ZWC:[id number] DOB: [birth year] DOA: 10/22/2020  PCP: [name], MD  Admit date: 10/22/2020 Discharge date: 10/25/2020  Admitted From: Home  Disposition:  Home   Recommendations for Outpatient Follow-up and new medication changes:  1. Follow up with Dr. Alyson Ingles in 14 days. 2. Continue self quarantine for 10 more days, use a mask in public and maintain physical distancing.    Home Health: no   Equipment/Devices: no    Discharge Condition: stable  CODE STATUS: full  Diet recommendation: heart healthy   Brief/Interim Summary: Mr. Silliman was admitted to the hospital with a working diagnosis of SARS COVID-19 viral pneumonia.  80 year old male past medical history for CLL, pancytopenia, leukocytosis, rectal cancer, type 2 diabetes mellitus, dyslipidemia and hypertension. He reported 2 weeks of coughing, associated with generalized weakness. While waiting in the emergency department he developed a syncope episode. On his initial physical examination blood pressure 167/96, heart rate 86, respiratory rate 16, temperature 99.6, oxygen saturation 98%. His lungs were clear to auscultation bilaterally, heart S1-S2, present rhythmic, soft abdomen, no lower extremity edema.  Sodium 136, potassium 4.6, chloride 94, bicarb 20, glucose 257, BUN 29, creatinine 1.62, white count 69.2, hemoglobin 10.2, hematocrit 33.7, platelets 120` SARS COVID-19 positive.  Chest radiograph with very faint infiltrate interstitial at the left base. CT chest negative for pulmonary embolism, bilateral basal groundglass opacities  EKG 113 bpm, normal axis, normal intervals, sinus rhythm, poor R wave progression, no ST segment or T wave changes.  Patient received medical therapy with remdesivir and systemic steroids with good toleration.  Low oxygen requirements.   1.  SARS COVID-19 viral pneumonia.  Patient was admitted to the medical ward, he received medical therapy  with systemic corticosteroids and remdesivir (3 doses), with significant improvement of her symptoms and inflammatory markers.  COVID-19 Labs  Recent Labs    10/22/20 2130 10/23/20 0601 10/24/20 0412 10/25/20 0416  DDIMER  --  0.62* 0.64* 0.60*  FERRITIN  --  66 91 115  LDH 184 186  --   --   CRP  --  8.4* 10.9* 6.1*    Lab Results  Component Value Date   [place] NEGATIVE 04/11/2019   [place] NEGATIVE 03/25/2019    Patient received bronchodilator therapy, antitussive agents and airway clearing techniques.  2. Dyslipidemia.  Continue statin therapy.  3.  Hypertension.  Continue blood pressure control with amlodipine and metoprolol. At discharge resume lisinopril at home dose.   4.  Type 2 diabetes mellitus, uncontrolled, steroid-induced hyperglycemia.  Patient received insulin sliding scale during his hospitalization with good toleration.  At discharge steroids will be discontinued. Resume metformin at discharge.   5.  CLL (anemia/thrombocytopenia).  Chronic leukocytosis, cell count remained stable, at discharge white cell count 54.4, hemoglobin 9.3, hematocrit 30.7, platelets 89. To continue ibrutinib.   6.  Syncope.  Likely multifactorial.  Patient received supportive medical therapy with intravenous fluids. He remained hemodynamically stable, further work-up with echocardiography showed a preserved LV systolic function, ejection fraction 60 to 65%.  Right ventricle systolic function was normal.  7.  Acute kidney injury.  Likely prerenal, patient received supportive medical therapy including intravenous fluids, his discharge creatinine is 1.14, BUN 25, sodium 139, potassium 4.5, chloride 108 and bicarb 23.  Discharge Diagnoses:  Principal Problem:   Pneumonia due to COVID-19 virus Active Problems:   Syncope   Hypertension   Hypercholesterolemia   Diabetes mellitus [facility])    Discharge Instructions   Allergies as of  10/25/2020      Reactions    Hydrocodone-acetaminophen Other (See Comments)   Loss of weight      Medication List    STOP taking these medications   traZODone 50 MG tablet Commonly known as: DESYREL     TAKE these medications   amLODipine 5 MG tablet Commonly known as: NORVASC Take 5 mg by mouth every morning.   aspirin 81 MG tablet Take 81 mg by mouth every morning.   atorvastatin 40 MG tablet Commonly known as: LIPITOR Take 40 mg by mouth every evening.   guaiFENesin-dextromethorphan 100-10 MG/5ML syrup Commonly known as: ROBITUSSIN DM Take 5 mLs by mouth every 6 (six) hours as needed for cough.   hydroxypropyl methylcellulose / hypromellose 2.5 % ophthalmic solution Commonly known as: ISOPTO TEARS / GONIOVISC Place 1 drop into both eyes in the morning and at bedtime.   Imbruvica 420 MG Tabs Generic drug: ibrutinib TAKE 1 TABLET BY MOUTH DAILY   lisinopril 20 MG tablet Commonly known as: ZESTRIL Take 20 mg by mouth every morning.   metFORMIN 1000 MG tablet Commonly known as: GLUCOPHAGE Take 1,000 mg by mouth 2 (two) times daily with a meal.   metoprolol tartrate 25 MG tablet Commonly known as: LOPRESSOR Take 0.5 tablets (12.5 mg total) by mouth 2 (two) times daily.   ondansetron 8 MG disintegrating tablet Commonly known as: ZOFRAN-ODT TAKE 1 TABLET BY MOUTH EVERY 8 HOURS AS NEEDED FOR NAUSEA OR VOMITING. What changed: See the new instructions.   pantoprazole 40 MG tablet Commonly known as: Protonix Take 1 tablet (40 mg total) by mouth daily.   prochlorperazine 10 MG tablet Commonly known as: COMPAZINE TAKE 1 TABLET (10 MG TOTAL) BY MOUTH EVERY 6 (SIX) HOURS AS NEEDED FOR NAUSEA OR VOMITING.   Shingrix injection Generic drug: Zoster Vaccine Adjuvanted   zolpidem 10 MG tablet Commonly known as: AMBIEN Take 5 mg by mouth at bedtime as needed for sleep.       Allergies  Allergen Reactions  . Hydrocodone-Acetaminophen Other (See Comments)    Loss of weight         Procedures/Studies: DG Chest 2 View  Result Date: 10/22/2020 CLINICAL DATA:  Cough for 2 weeks EXAM: CHEST - 2 VIEW COMPARISON:  None. FINDINGS: The heart size and mediastinal contours are within normal limits. Both lungs are clear. The visualized skeletal structures are unremarkable. IMPRESSION: No active cardiopulmonary disease. Electronically Signed   By: Inez Catalina M.D.   On: 10/22/2020 17:00   CT Head Wo Contrast  Result Date: 10/22/2020 CLINICAL DATA:  Dizziness and fall. Now with headache and neck trauma. EXAM: CT HEAD WITHOUT CONTRAST CT CERVICAL SPINE WITHOUT CONTRAST TECHNIQUE: Multidetector CT imaging of the head and cervical spine was performed following the standard protocol without intravenous contrast. Multiplanar CT image reconstructions of the cervical spine were also generated. COMPARISON:  CT head 03/25/2019 and 04/11/2019 FINDINGS: CT HEAD FINDINGS Brain: No evidence of acute infarction, hemorrhage, hydrocephalus, extra-axial collection or mass lesion/mass effect. Mild cortical atrophy. Low-attenuation changes in the deep white matter consistent with small vessel ischemia. Vascular: Mild intracranial arterial vascular calcifications. Skull: Calvarium appears intact. No acute depressed skull fractures. Sinuses/Orbits: Paranasal sinuses and mastoid air cells are clear. Other: None. CT CERVICAL SPINE FINDINGS Alignment: Normal alignment. Skull base and vertebrae: Skull base appears intact. No vertebral compression deformities. No focal bone lesion or bone destruction. Bone cortex appears intact. Soft tissues and spinal canal: No prevertebral soft tissue swelling. No abnormal paraspinal  soft tissue mass or infiltration. Disc levels: Intervertebral disc heights are normal. Mild endplate hypertrophic changes. Upper chest: Visualized lung apices are clear. Other: None. IMPRESSION: 1. No acute intracranial abnormalities. Chronic atrophy and small vessel ischemic changes. 2. Normal  alignment of the cervical spine. No acute displaced fractures identified. Electronically Signed   By: Lucienne Capers M.D.   On: 10/22/2020 19:56   CT Angio Chest PE W and/or Wo Contrast  Result Date: 10/22/2020 CLINICAL DATA:  Cough for 2 weeks. Dizziness and headache. COVID positive. Pulmonary embolus is suspected with high probability. EXAM: CT ANGIOGRAPHY CHEST WITH CONTRAST TECHNIQUE: Multidetector CT imaging of the chest was performed using the standard protocol during bolus administration of intravenous contrast. Multiplanar CT image reconstructions and MIPs were obtained to evaluate the vascular anatomy. CONTRAST:  74mL OMNIPAQUE IOHEXOL 350 MG/ML SOLN COMPARISON:  CT chest 02/15/2012.  Chest radiograph 10/22/2020 FINDINGS: Cardiovascular: Moderately good opacification of the central and segmental pulmonary arteries. No focal filling defects. No evidence of significant pulmonary embolus. Normal caliber thoracic aorta. Great vessel origins are patent. Aortic and coronary artery calcifications. Normal heart size. No pericardial effusions. Mediastinum/Nodes: Esophagus is decompressed. No significant lymphadenopathy in the chest. Lungs/Pleura: Motion artifact limits examination. Patchy airspace disease in both lungs suggesting pneumonia. No pleural effusions. No pneumothorax. Airways are patent. Upper Abdomen: Several cysts in the liver and right kidney. Similar appearance to previous study. No acute abnormalities demonstrated in the visualized upper abdomen. Musculoskeletal: No chest wall abnormality. No acute or significant osseous findings. Review of the MIP images confirms the above findings. IMPRESSION: 1. No evidence of significant pulmonary embolus. 2. Patchy airspace disease in both lungs suggesting pneumonia. 3. Aortic atherosclerosis. Aortic Atherosclerosis (ICD10-I70.0). Electronically Signed   By: Lucienne Capers M.D.   On: 10/22/2020 21:51   CT Cervical Spine Wo Contrast  Result Date:  10/22/2020 CLINICAL DATA:  Dizziness and fall. Now with headache and neck trauma. EXAM: CT HEAD WITHOUT CONTRAST CT CERVICAL SPINE WITHOUT CONTRAST TECHNIQUE: Multidetector CT imaging of the head and cervical spine was performed following the standard protocol without intravenous contrast. Multiplanar CT image reconstructions of the cervical spine were also generated. COMPARISON:  CT head 03/25/2019 and 04/11/2019 FINDINGS: CT HEAD FINDINGS Brain: No evidence of acute infarction, hemorrhage, hydrocephalus, extra-axial collection or mass lesion/mass effect. Mild cortical atrophy. Low-attenuation changes in the deep white matter consistent with small vessel ischemia. Vascular: Mild intracranial arterial vascular calcifications. Skull: Calvarium appears intact. No acute depressed skull fractures. Sinuses/Orbits: Paranasal sinuses and mastoid air cells are clear. Other: None. CT CERVICAL SPINE FINDINGS Alignment: Normal alignment. Skull base and vertebrae: Skull base appears intact. No vertebral compression deformities. No focal bone lesion or bone destruction. Bone cortex appears intact. Soft tissues and spinal canal: No prevertebral soft tissue swelling. No abnormal paraspinal soft tissue mass or infiltration. Disc levels: Intervertebral disc heights are normal. Mild endplate hypertrophic changes. Upper chest: Visualized lung apices are clear. Other: None. IMPRESSION: 1. No acute intracranial abnormalities. Chronic atrophy and small vessel ischemic changes. 2. Normal alignment of the cervical spine. No acute displaced fractures identified. Electronically Signed   By: Lucienne Capers M.D.   On: 10/22/2020 19:56   ECHOCARDIOGRAM COMPLETE  Result Date: 10/23/2020    ECHOCARDIOGRAM REPORT   Patient Name:   DAK SZUMSKI Date of Exam: 10/23/2020 Medical Rec #:  [id number]       Height:       67.0 in Accession #:    [id number]  Weight:       125.0 lb Date of Birth:  11-20-1940        BSA:          1.656 m Patient  Age:    89 years        BP:           147/65 mmHg Patient Gender: M               HR:           82 bpm. Exam Location:  Inpatient Procedure: 2D Echo, Cardiac Doppler and Color Doppler Indications:    R55 Syncope  History:        Patient has prior history of Echocardiogram examinations, most                 recent 03/27/2019. Risk Factors:Hypertension, Diabetes and                 Dyslipidemia. Cancer. COVID-19 Positive.  Sonographer:    Jonelle Sidle Dance Referring Phys: [id number] [place]  1. Left ventricular ejection fraction, by estimation, is 60 to 65%. The left ventricle has normal function. The left ventricle has no regional wall motion abnormalities. Left ventricular diastolic parameters were normal.  2. Right ventricular systolic function is normal. The right ventricular size is normal.  3. The mitral valve is normal in structure. Trivial mitral valve regurgitation. No evidence of mitral stenosis.  4. The aortic valve is tricuspid. Aortic valve regurgitation is not visualized. Mild aortic valve sclerosis is present, with no evidence of aortic valve stenosis.  5. The inferior vena cava is normal in size with greater than 50% respiratory variability, suggesting right atrial pressure of 3 mmHg. FINDINGS  Left Ventricle: Left ventricular ejection fraction, by estimation, is 60 to 65%. The left ventricle has normal function. The left ventricle has no regional wall motion abnormalities. The left ventricular internal cavity size was normal in size. There is  no left ventricular hypertrophy. Left ventricular diastolic parameters were normal. Right Ventricle: The right ventricular size is normal. No increase in right ventricular wall thickness. Right ventricular systolic function is normal. Left Atrium: Left atrial size was normal in size. Right Atrium: Right atrial size was normal in size. Pericardium: There is no evidence of pericardial effusion. Mitral Valve: The mitral valve is normal in structure.  Trivial mitral valve regurgitation. No evidence of mitral valve stenosis. Tricuspid Valve: The tricuspid valve is normal in structure. Tricuspid valve regurgitation is mild . No evidence of tricuspid stenosis. Aortic Valve: The aortic valve is tricuspid. Aortic valve regurgitation is not visualized. Mild aortic valve sclerosis is present, with no evidence of aortic valve stenosis. Pulmonic Valve: The pulmonic valve was normal in structure. Pulmonic valve regurgitation is not visualized. No evidence of pulmonic stenosis. Aorta: The aortic root is normal in size and structure. Venous: The inferior vena cava is normal in size with greater than 50% respiratory variability, suggesting right atrial pressure of 3 mmHg. IAS/Shunts: No atrial level shunt detected by color flow Doppler.  LEFT VENTRICLE PLAX 2D LVIDd:         3.91 cm  Diastology LVIDs:         2.28 cm  LV e' medial:    6.09 cm/s LV PW:         1.05 cm  LV E/e' medial:  10.2 LV IVS:        1.04 cm  LV e' lateral:   7.29 cm/s LVOT diam:  2.20 cm  LV E/e' lateral: 8.5 LV SV:         67 LV SV Index:   40 LVOT Area:     3.80 cm  RIGHT VENTRICLE             IVC RV Basal diam:  2.61 cm     IVC diam: 1.50 cm RV S prime:     18.10 cm/s TAPSE (M-mode): 2.2 cm LEFT ATRIUM         Index LA diam:    3.40 cm 2.05 cm/m  AORTIC VALVE LVOT Vmax:   86.70 cm/s LVOT Vmean:  52.700 cm/s LVOT VTI:    0.176 m  AORTA Ao Root diam: 3.10 cm Ao Asc diam:  2.60 cm MITRAL VALVE MV Area (PHT): 3.08 cm     SHUNTS MV Decel Time: 246 msec     Systemic VTI:  0.18 m MV E velocity: 62.10 cm/s   Systemic Diam: 2.20 cm MV A velocity: 103.00 cm/s MV E/A ratio:  0.60 Jenkins Rouge MD Electronically signed by Jenkins Rouge MD Signature Date/Time: 10/23/2020/10:21:26 AM    Final    VAS US CAROTID  Result Date: 10/23/2020 Carotid Arterial Duplex Study Indications:       Syncope. Risk Factors:      Hypertension, Diabetes. Other Factors:     COVID 19 positive. Comparison Study:  No prior studies.  Performing Technologist: Oliver Hum RVT  Examination Guidelines: A complete evaluation includes B-mode imaging, spectral Doppler, color Doppler, and power Doppler as needed of all accessible portions of each vessel. Bilateral testing is considered an integral part of a complete examination. Limited examinations for reoccurring indications may be performed as noted.  Right Carotid Findings: +----------+--------+--------+--------+-----------------------+--------+           PSV cm/sEDV cm/sStenosisPlaque Description     Comments +----------+--------+--------+--------+-----------------------+--------+ CCA Prox  92      12              smooth and heterogenous         +----------+--------+--------+--------+-----------------------+--------+ CCA Distal85      15              smooth and heterogenous         +----------+--------+--------+--------+-----------------------+--------+ ICA Prox  76      16                                              +----------+--------+--------+--------+-----------------------+--------+ ICA Distal95      17                                     tortuous +----------+--------+--------+--------+-----------------------+--------+ ECA       96      6                                               +----------+--------+--------+--------+-----------------------+--------+ +----------+--------+-------+--------+-------------------+           PSV cm/sEDV cmsDescribeArm Pressure (mmHG) +----------+--------+-------+--------+-------------------+ [id number]                                        +----------+--------+-------+--------+-------------------+ +---------+--------+--+--------+--+---------+  VertebralPSV cm/s57EDV cm/s10Antegrade +---------+--------+--+--------+--+---------+  Left Carotid Findings: +----------+--------+--------+--------+-----------------------+--------+           PSV cm/sEDV cm/sStenosisPlaque Description     Comments  +----------+--------+--------+--------+-----------------------+--------+ CCA Prox  110     11              smooth and heterogenous         +----------+--------+--------+--------+-----------------------+--------+ CCA Distal84      10              smooth and heterogenous         +----------+--------+--------+--------+-----------------------+--------+ ICA Prox  74      10              smooth and heterogenous         +----------+--------+--------+--------+-----------------------+--------+ ICA Distal90      19                                     tortuous +----------+--------+--------+--------+-----------------------+--------+ ECA       101     1                                               +----------+--------+--------+--------+-----------------------+--------+ +----------+--------+--------+--------+-------------------+           PSV cm/sEDV cm/sDescribeArm Pressure (mmHG) +----------+--------+--------+--------+-------------------+ Subclavian108                                         +----------+--------+--------+--------+-------------------+ +---------+--------+--+--------+--+---------+ VertebralPSV cm/s55EDV cm/s12Antegrade +---------+--------+--+--------+--+---------+   Summary: Right Carotid: Velocities in the right ICA are consistent with a 1-39% stenosis. Left Carotid: Velocities in the left ICA are consistent with a 1-39% stenosis. Vertebrals: Bilateral vertebral arteries demonstrate antegrade flow. *See table(s) above for measurements and observations.  Electronically signed by Harold Barban MD on 10/23/2020 at 9:39:11 PM.    Final       Subjective: Patient is feeling better, dyspnea has improved along with cough. No further syncope episodes.   Discharge Exam: Vitals:   10/24/20 2004 10/25/20 0521  BP: (!) 152/74 (!) 151/70  Pulse: 71 90  Resp: 18 20  Temp: 98.1 F (36.7 C) 98.5 F (36.9 C)  SpO2: 100% 100%   Vitals:   10/24/20 0637  10/24/20 1502 10/24/20 2004 10/25/20 0521  BP: 140/67 133/67 (!) 152/74 (!) 151/70  Pulse: 74 79 71 90  Resp: 17 16 18 20   Temp: 97.6 F (36.4 C) 98.2 F (36.8 C) 98.1 F (36.7 C) 98.5 F (36.9 C)  TempSrc: Oral Oral Oral   SpO2: 100% 99% 100% 100%  Weight:      Height:        General: Not in pain or dyspnea.  Neurology: Awake and alert, non focal  E ENT: no pallor, no icterus, oral mucosa moist Cardiovascular: No JVD. S1-S2 present, rhythmic, no gallops, rubs, or murmurs. No lower extremity edema. Pulmonary: positive breath sounds bilaterally, Gastrointestinal. Abdomen soft and non tender Skin. No rashes Musculoskeletal: no joint deformities   The results of significant diagnostics from this hospitalization (including imaging, microbiology, ancillary and laboratory) are listed below for reference.     Microbiology: Recent Results (from the past 240 hour(s))  Blood Culture (routine x 2)     Status:  None (Preliminary result)   Collection Time: 10/22/20  8:41 PM   Specimen: BLOOD  Result Value Ref Range Status   Specimen Description   Final    BLOOD LEFT WRIST Performed at [facility] [address] [place], [place] [address]    Special Requests   Final    BOTTLES DRAWN AEROBIC AND ANAEROBIC Blood Culture adequate volume Performed at [facility] [address], [place], [place] [address]    Culture   Final    NO GROWTH 2 DAYS Performed at [facility] [address] [place], [place] [address]    Report Status PENDING  Incomplete  Blood Culture (routine x 2)     Status: None (Preliminary result)   Collection Time: 10/22/20  8:46 PM   Specimen: BLOOD RIGHT FOREARM  Result Value Ref Range Status   Specimen Description   Final    BLOOD RIGHT FOREARM Performed at [facility] [address] [place], [place] [address]    Special Requests   Final    BOTTLES DRAWN AEROBIC AND ANAEROBIC Blood Culture  adequate volume Performed at [facility] [address], [place], [place] [address]    Culture   Final    NO GROWTH 2 DAYS Performed at [facility] [address] [place], [place] [address]    Report Status PENDING  Incomplete     Labs: BNP (last 3 results) No results for input(s): BNP in the last 8760 hours. Basic Metabolic Panel: Recent Labs  Lab 10/22/20 1932 10/22/20 2339 10/23/20 0601 10/24/20 0412 10/25/20 0416  NA 136  --  138 140 139  K 4.6  --  4.1 3.9 4.5  CL 104  --  105 111 108  CO2 20*  --  20* 22 23  GLUCOSE 257*  --  276* 218* 263*  BUN 29*  --  25* 23 25*  CREATININE 1.62*  --  1.40* 1.50* 1.14  CALCIUM 9.0  --  8.2* 8.2* 8.8*  MG  --  1.5* 2.0  --   --   PHOS  --  2.4* 2.9  --   --    Liver Function Tests: Recent Labs  Lab 10/22/20 1932 10/23/20 0601 10/24/20 0412 10/25/20 0416  AST 28 19 17 22   ALT 20 17 15 20   ALKPHOS 115 96 78 81  BILITOT 0.7 0.5 0.8 1.0  PROT 6.3* 5.7* 5.3* 5.7*  ALBUMIN 3.5 2.9* 2.9* 3.1*   No results for input(s): LIPASE, AMYLASE in the last 168 hours. No results for input(s): AMMONIA in the last 168 hours. CBC: Recent Labs  Lab 10/22/20 1932 10/23/20 0601  WBC 69.2* 54.4*  NEUTROABS 1.4* 2.2  HGB 10.2* 9.3*  HCT 33.7* 30.7*  MCV 94.7 93.3  PLT 120* 89*   Cardiac Enzymes: Recent Labs  Lab 10/22/20 2339  CKTOTAL 31*   BNP: Invalid input(s): POCBNP CBG: Recent Labs  Lab 10/24/20 0758 10/24/20 1117 10/24/20 1635 10/24/20 2008 10/25/20 0807  GLUCAP 168* 173* 287* 182* 224*   D-Dimer Recent Labs    10/24/20 0412 10/25/20 0416  DDIMER 0.64* 0.60*   Hgb A1c Recent Labs    10/23/20 0002  HGBA1C 8.8*   Lipid Profile Recent Labs    10/22/20 1933  TRIG 87   Thyroid function studies No results for input(s): TSH, T4TOTAL, T3FREE, THYROIDAB in the last 72 hours.  Invalid input(s): FREET3 Anemia work up [employer]  10/24/20 0412 10/25/20 0416  FERRITIN 91  115   Urinalysis    Component Value Date/Time   COLORURINE STRAW (A) 04/11/2019 0944   APPEARANCEUR CLEAR 04/11/2019 0944   LABSPEC 1.008 04/11/2019 0944   PHURINE 7.0 04/11/2019 [place] 04/11/2019 0944   HGBUR NEGATIVE 04/11/2019 0944   BILIRUBINUR NEGATIVE 04/11/2019 0944   KETONESUR NEGATIVE 04/11/2019 0944   PROTEINUR NEGATIVE 04/11/2019 0944   UROBILINOGEN 0.2 09/17/2013 0640   NITRITE NEGATIVE 04/11/2019 0944   LEUKOCYTESUR NEGATIVE 04/11/2019 0944   Sepsis Labs Invalid input(s): PROCALCITONIN,  WBC,  LACTICIDVEN Microbiology Recent Results (from the past 240 hour(s))  Blood Culture (routine x 2)     Status: None (Preliminary result)   Collection Time: 10/22/20  8:41 PM   Specimen: BLOOD  Result Value Ref Range Status   Specimen Description   Final    BLOOD LEFT WRIST Performed at [facility] [address], [place], [place] [address]    Special Requests   Final    BOTTLES DRAWN AEROBIC AND ANAEROBIC Blood Culture adequate volume Performed at [facility] [address], [place], [place] [address]    Culture   Final    NO GROWTH 2 DAYS Performed at [facility] [address] [facility] [address] [place], [place] [address]    Report Status PENDING  Incomplete  Blood Culture (routine x 2)     Status: None (Preliminary result)   Collection Time: 10/22/20  8:46 PM   Specimen: BLOOD RIGHT FOREARM  Result Value Ref Range Status   Specimen Description   Final    BLOOD RIGHT FOREARM Performed at [facility] [address] [place], [place] [address]    Special Requests   Final    BOTTLES DRAWN AEROBIC AND ANAEROBIC Blood Culture adequate volume Performed at [facility] [address], [place], [place] [address]    Culture   Final    NO GROWTH 2 DAYS Performed at [facility] [address], [place], [place] [address]    Report Status PENDING  Incomplete     Time  coordinating discharge: 45 minutes  SIGNED:   Tawni Millers, MD  [facility] 10/25/2020, 8:54 AM

## 2020-10-26 NOTE — Progress Notes (Signed)
I was called by microbiology, one bottle out of four, positive for gram positive rods.   Patient had no clinical signs of bacteremia or apparent bacterial infection at the time of his discharge. Possible contaminated culture.   Will continue to follow on final report.

## 2020-10-27 LAB — CULTURE, BLOOD (ROUTINE X 2)
Culture: NO GROWTH
Special Requests: ADEQUATE

## 2020-10-28 LAB — CULTURE, BLOOD (ROUTINE X 2): Special Requests: ADEQUATE

## 2020-11-08 DIAGNOSIS — J1282 Pneumonia due to coronavirus disease 2019: Secondary | ICD-10-CM | POA: Diagnosis not present

## 2020-11-08 DIAGNOSIS — R5381 Other malaise: Secondary | ICD-10-CM | POA: Diagnosis not present

## 2020-11-08 DIAGNOSIS — E46 Unspecified protein-calorie malnutrition: Secondary | ICD-10-CM | POA: Diagnosis not present

## 2020-11-08 DIAGNOSIS — C9110 Chronic lymphocytic leukemia of B-cell type not having achieved remission: Secondary | ICD-10-CM | POA: Diagnosis not present

## 2020-11-20 ENCOUNTER — Other Ambulatory Visit: Payer: Self-pay | Admitting: Oncology

## 2020-11-21 MED FILL — IMBRUVICA 420 MG TAB: 420 | 28 days supply | Qty: 28 | Fill #0

## 2020-11-22 ENCOUNTER — Inpatient Hospital Stay (HOSPITAL_BASED_OUTPATIENT_CLINIC_OR_DEPARTMENT_OTHER): Payer: Medicare Other | Admitting: Oncology

## 2020-11-22 ENCOUNTER — Inpatient Hospital Stay: Payer: Medicare Other | Attending: Oncology

## 2020-11-22 ENCOUNTER — Other Ambulatory Visit: Payer: Self-pay

## 2020-11-22 VITALS — BP 122/60 | HR 77 | Temp 97.7°F | Resp 13 | Ht 67.0 in | Wt 123.5 lb

## 2020-11-22 DIAGNOSIS — Z85048 Personal history of other malignant neoplasm of rectum, rectosigmoid junction, and anus: Secondary | ICD-10-CM | POA: Insufficient documentation

## 2020-11-22 DIAGNOSIS — C9110 Chronic lymphocytic leukemia of B-cell type not having achieved remission: Secondary | ICD-10-CM | POA: Diagnosis not present

## 2020-11-22 DIAGNOSIS — Z7982 Long term (current) use of aspirin: Secondary | ICD-10-CM | POA: Diagnosis not present

## 2020-11-22 DIAGNOSIS — C9112 Chronic lymphocytic leukemia of B-cell type in relapse: Secondary | ICD-10-CM | POA: Diagnosis not present

## 2020-11-22 DIAGNOSIS — Z8616 Personal history of COVID-19: Secondary | ICD-10-CM | POA: Diagnosis not present

## 2020-11-22 DIAGNOSIS — Z79899 Other long term (current) drug therapy: Secondary | ICD-10-CM | POA: Insufficient documentation

## 2020-11-22 DIAGNOSIS — Z7984 Long term (current) use of oral hypoglycemic drugs: Secondary | ICD-10-CM | POA: Diagnosis not present

## 2020-11-22 LAB — CBC WITH DIFFERENTIAL (CANCER CENTER ONLY)
Abs Immature Granulocytes: 0.05 10*3/uL (ref 0.00–0.07)
Basophils Absolute: 0.1 10*3/uL (ref 0.0–0.1)
Basophils Relative: 0 %
Eosinophils Absolute: 0.1 10*3/uL (ref 0.0–0.5)
Eosinophils Relative: 1 %
HCT: 32.1 % — ABNORMAL LOW (ref 39.0–52.0)
Hemoglobin: 9.8 g/dL — ABNORMAL LOW (ref 13.0–17.0)
Immature Granulocytes: 0 %
Lymphocytes Relative: 91 %
Lymphs Abs: 24.4 10*3/uL — ABNORMAL HIGH (ref 0.7–4.0)
MCH: 29.3 pg (ref 26.0–34.0)
MCHC: 30.5 g/dL (ref 30.0–36.0)
MCV: 96.1 fL (ref 80.0–100.0)
Monocytes Absolute: 0.4 10*3/uL (ref 0.1–1.0)
Monocytes Relative: 2 %
Neutro Abs: 1.6 10*3/uL — ABNORMAL LOW (ref 1.7–7.7)
Neutrophils Relative %: 6 %
Platelet Count: 124 10*3/uL — ABNORMAL LOW (ref 150–400)
RBC: 3.34 MIL/uL — ABNORMAL LOW (ref 4.22–5.81)
RDW: 15.7 % — ABNORMAL HIGH (ref 11.5–15.5)
WBC Count: 26.6 10*3/uL — ABNORMAL HIGH (ref 4.0–10.5)
nRBC: 0 % (ref 0.0–0.2)

## 2020-11-22 LAB — CMP (CANCER CENTER ONLY)
ALT: 10 U/L (ref 0–44)
AST: 11 U/L — ABNORMAL LOW (ref 15–41)
Albumin: 3.4 g/dL — ABNORMAL LOW (ref 3.5–5.0)
Alkaline Phosphatase: 101 U/L (ref 38–126)
Anion gap: 9 (ref 5–15)
BUN: 25 mg/dL — ABNORMAL HIGH (ref 8–23)
CO2: 25 mmol/L (ref 22–32)
Calcium: 8.8 mg/dL — ABNORMAL LOW (ref 8.9–10.3)
Chloride: 108 mmol/L (ref 98–111)
Creatinine: 1.85 mg/dL — ABNORMAL HIGH (ref 0.61–1.24)
GFR, Estimated: 37 mL/min — ABNORMAL LOW (ref >60)
Glucose, Bld: 212 mg/dL — ABNORMAL HIGH (ref 70–99)
Potassium: 4.6 mmol/L (ref 3.5–5.1)
Sodium: 142 mmol/L (ref 135–145)
Total Bilirubin: 0.6 mg/dL (ref 0.3–1.2)
Total Protein: 5.9 g/dL — ABNORMAL LOW (ref 6.5–8.1)

## 2020-11-22 NOTE — Progress Notes (Signed)
Hematology and Oncology Follow Up Visit  [name] [id number] 1941-01-22 80 y.o. 11/22/2020 3:02 PM [name], MDReade, [name], MD   Principle Diagnosis: 80 year old man with CLL presented with lymphocytosis and pancytopenia  in 2013.  He developed relapsed disease in 2021 that required retreatment.  Secondary diagnosis: T2N0 rectal cancer diagnosed in 2009 is currently in remission after surgical resection.    Prior Therapy: He is status post FCR chemotherapy completed on on April of 2013 and accomplished complete response.  He developed relapsed disease and May 2021.   Current therapy: Ibrutinib 420 mg daily started in May 2021.  Interim History: [name] is here for repeat evaluation.  Since the last visit, he was hospitalized the end of January for a COVID-19 infection after presenting with syncopal episode and found to have lung infiltrate and SARS-CoV-2 positive  serology.  He was treated with remdesivir and corticosteroids with improvement in symptoms.  Since his discharge, he reports feeling better with improvement in his symptoms.  He denies any shortness of breath or difficulty breathing.  Denies any cough or wheezing.  He continues to tolerate ibrutinib without any new complaints.  Continues to have issues with appetite although has eaten better and gained more weight.         Medications: Reviewed without changes. Current Outpatient Medications  Medication Sig Dispense Refill  . amLODipine (NORVASC) 5 MG tablet Take 5 mg by mouth every morning.    Marland Kitchen aspirin 81 MG tablet Take 81 mg by mouth every morning.     Marland Kitchen atorvastatin (LIPITOR) 40 MG tablet Take 40 mg by mouth every evening.     Marland Kitchen guaiFENesin-dextromethorphan (ROBITUSSIN DM) 100-10 MG/5ML syrup Take 5 mLs by mouth every 6 (six) hours as needed for cough. 118 mL 0  . hydroxypropyl methylcellulose / hypromellose (ISOPTO TEARS / GONIOVISC) 2.5 % ophthalmic solution Place 1 drop into both eyes in the morning  and at bedtime.    . IMBRUVICA 420 MG TABS TAKE 1 TABLET BY MOUTH DAILY 28 tablet 0  . lisinopril (ZESTRIL) 20 MG tablet Take 20 mg by mouth every morning.    . metFORMIN (GLUCOPHAGE) 1000 MG tablet Take 1,000 mg by mouth 2 (two) times daily with a meal.  0  . metoprolol tartrate (LOPRESSOR) 25 MG tablet Take 0.5 tablets (12.5 mg total) by mouth 2 (two) times daily. 30 tablet 2  . ondansetron (ZOFRAN-ODT) 8 MG disintegrating tablet TAKE 1 TABLET BY MOUTH EVERY 8 HOURS AS NEEDED FOR NAUSEA OR VOMITING. (Patient taking differently: Take 8 mg by mouth every 8 (eight) hours as needed for nausea or vomiting.) 20 tablet 0  . pantoprazole (PROTONIX) 40 MG tablet Take 1 tablet (40 mg total) by mouth daily. 90 tablet 0  . prochlorperazine (COMPAZINE) 10 MG tablet TAKE 1 TABLET (10 MG TOTAL) BY MOUTH EVERY 6 (SIX) HOURS AS NEEDED FOR NAUSEA OR VOMITING. 30 tablet 1  . SHINGRIX injection     . zolpidem (AMBIEN) 10 MG tablet Take 5 mg by mouth at bedtime as needed for sleep.     No current facility-administered medications for this visit.     Allergies:  Allergies  Allergen Reactions  . Hydrocodone-Acetaminophen Other (See Comments)    Loss of weight     Physical Exam:     Blood pressure 122/60, pulse 77, temperature 97.7 F (36.5 C), temperature source Tympanic, resp. rate 13, height 5\' 7"  (1.702 m), weight 123 lb 8 oz (56 kg), SpO2 100 %.  ECOG: 1     General appearance: Alert, awake without any distress. Head: Atraumatic without abnormalities Oropharynx: Without any thrush or ulcers. Eyes: No scleral icterus. Lymph nodes: No lymphadenopathy noted in the cervical, supraclavicular, or axillary nodes Heart:regular rate and rhythm, without any murmurs or gallops.   Lung: Clear to auscultation without any rhonchi, wheezes or dullness to percussion. Abdomin: Soft, nontender without any shifting dullness or ascites. Musculoskeletal: No clubbing or cyanosis. Neurological: No motor  or sensory deficits. Skin: No rashes or lesions.         Lab Results: Lab Results  Component Value Date   WBC 54.4 (HH) 10/23/2020   HGB 9.3 (L) 10/23/2020   HCT 30.7 (L) 10/23/2020   MCV 93.3 10/23/2020   PLT 89 (L) 10/23/2020     Chemistry      Component Value Date/Time   NA 139 10/25/2020 0416   NA 140 07/13/2017 1456   K 4.5 10/25/2020 0416   K 4.6 07/13/2017 1456   CL 108 10/25/2020 0416   CL 104 01/10/2013 1128   CO2 23 10/25/2020 0416   CO2 24 07/13/2017 1456   BUN 25 (H) 10/25/2020 0416   BUN 18.9 07/13/2017 1456   CREATININE 1.14 10/25/2020 0416   CREATININE 2.28 (H) 09/10/2020 1509   CREATININE 1.2 07/13/2017 1456      Component Value Date/Time   CALCIUM 8.8 (L) 10/25/2020 0416   CALCIUM 9.5 07/13/2017 1456   ALKPHOS 81 10/25/2020 0416   ALKPHOS 121 07/13/2017 1456   AST 22 10/25/2020 0416   AST 12 (L) 09/10/2020 1509   AST 17 07/13/2017 1456   ALT 20 10/25/2020 0416   ALT 12 09/10/2020 1509   ALT 19 07/13/2017 1456   BILITOT 1.0 10/25/2020 0416   BILITOT 0.7 09/10/2020 1509   BILITOT 0.60 07/13/2017 1456       IMPRESSION: 1. No evidence of significant pulmonary embolus. 2. Patchy airspace disease in both lungs suggesting pneumonia. 3. Aortic atherosclerosis.       Impression and Plan:  80 year old man with  1.  CLL diagnosed in 2013.  He subsequently developed relapsed disease and currently on ibrutinib since May 2021.  The natural course of his disease was reviewed and treatment options for the future were discussed.  Risks and benefits of continuing ibrutinib were discussed and alternative treatment options were reiterated.  Complications such as cardiac arrhythmia, atrial fibrillation, bleeding among others were reviewed.  Alternative medication such as Calquence can be used from the same family of drugs with different side effect profile.  For the time being he is agreeable to continue.  Laboratory testing from today reviewed  and continues to show improvement in his white cell count consistent with disease response.   2.  COVID-19 infection: He is to have recovered at this time.  3. Thrombocytopenia: Related to his CLL continues to improve with therapy.   4.  Anorexia: He is eating better and gained more weight.  5. Followup: In 3 months for repeat follow-up.  30  minutes were dedicated to this encounter.  Time was spent on reviewing laboratory data, disease status update and complications noted therapy.   [name], MD 2/25/20223:02 PM

## 2020-11-23 ENCOUNTER — Other Ambulatory Visit: Payer: Self-pay | Admitting: Oncology

## 2020-11-25 ENCOUNTER — Telehealth: Payer: Self-pay | Admitting: Oncology

## 2020-11-25 NOTE — Telephone Encounter (Signed)
Scheduled per 02/25 los, patient has been called and notified of upcoming appointment.

## 2020-12-13 ENCOUNTER — Other Ambulatory Visit: Payer: Self-pay | Admitting: Oncology

## 2020-12-17 MED FILL — IMBRUVICA 420 MG TAB: 420 | 28 days supply | Qty: 28 | Fill #0

## 2020-12-25 ENCOUNTER — Other Ambulatory Visit: Payer: Self-pay | Admitting: Oncology

## 2020-12-27 ENCOUNTER — Other Ambulatory Visit (HOSPITAL_COMMUNITY): Payer: Self-pay

## 2021-01-01 DIAGNOSIS — Z23 Encounter for immunization: Secondary | ICD-10-CM | POA: Diagnosis not present

## 2021-01-09 ENCOUNTER — Other Ambulatory Visit (HOSPITAL_COMMUNITY): Payer: Self-pay

## 2021-01-09 ENCOUNTER — Other Ambulatory Visit: Payer: Self-pay | Admitting: Oncology

## 2021-01-09 MED ORDER — IMBRUVICA 420 MG PO TABS
1.0000 | ORAL_TABLET | Freq: Every day | ORAL | 0 refills | Status: DC
  Filled 2021-01-09: qty 28, 28d supply, fill #0

## 2021-02-02 DIAGNOSIS — R531 Weakness: Secondary | ICD-10-CM | POA: Diagnosis not present

## 2021-02-06 ENCOUNTER — Other Ambulatory Visit: Payer: Self-pay | Admitting: Oncology

## 2021-02-06 ENCOUNTER — Other Ambulatory Visit (HOSPITAL_COMMUNITY): Payer: Self-pay

## 2021-02-06 MED ORDER — IMBRUVICA 420 MG PO TABS
1.0000 | ORAL_TABLET | Freq: Every day | ORAL | 0 refills | Status: DC
  Filled 2021-02-06: qty 28, 28d supply, fill #0

## 2021-02-10 ENCOUNTER — Other Ambulatory Visit (HOSPITAL_COMMUNITY): Payer: Self-pay

## 2021-02-10 ENCOUNTER — Telehealth: Payer: Self-pay

## 2021-02-10 NOTE — Telephone Encounter (Signed)
Oral Oncology Patient Advocate Encounter  Received notification from [employer] that prior authorization for Imbruvica is required.  PA submitted on CoverMyMeds Key [id number] Status is pending  Oral Oncology Clinic will continue to follow.  [facility] Phone [phone] Fax [phone] 02/10/2021 12:17 PM

## 2021-02-12 ENCOUNTER — Other Ambulatory Visit (HOSPITAL_COMMUNITY): Payer: Self-pay

## 2021-02-13 ENCOUNTER — Other Ambulatory Visit: Payer: Self-pay | Admitting: Oncology

## 2021-02-20 DIAGNOSIS — E7800 Pure hypercholesterolemia, unspecified: Secondary | ICD-10-CM | POA: Diagnosis not present

## 2021-02-20 DIAGNOSIS — E1169 Type 2 diabetes mellitus with other specified complication: Secondary | ICD-10-CM | POA: Diagnosis not present

## 2021-02-20 DIAGNOSIS — G4700 Insomnia, unspecified: Secondary | ICD-10-CM | POA: Diagnosis not present

## 2021-02-20 DIAGNOSIS — N1831 Chronic kidney disease, stage 3a: Secondary | ICD-10-CM | POA: Diagnosis not present

## 2021-02-20 DIAGNOSIS — C9110 Chronic lymphocytic leukemia of B-cell type not having achieved remission: Secondary | ICD-10-CM | POA: Diagnosis not present

## 2021-02-20 DIAGNOSIS — N529 Male erectile dysfunction, unspecified: Secondary | ICD-10-CM | POA: Diagnosis not present

## 2021-02-20 DIAGNOSIS — E46 Unspecified protein-calorie malnutrition: Secondary | ICD-10-CM | POA: Diagnosis not present

## 2021-02-20 DIAGNOSIS — K219 Gastro-esophageal reflux disease without esophagitis: Secondary | ICD-10-CM | POA: Diagnosis not present

## 2021-02-20 DIAGNOSIS — D696 Thrombocytopenia, unspecified: Secondary | ICD-10-CM | POA: Diagnosis not present

## 2021-02-20 DIAGNOSIS — I129 Hypertensive chronic kidney disease with stage 1 through stage 4 chronic kidney disease, or unspecified chronic kidney disease: Secondary | ICD-10-CM | POA: Diagnosis not present

## 2021-02-21 ENCOUNTER — Inpatient Hospital Stay: Payer: Medicare Other | Attending: Oncology

## 2021-02-21 ENCOUNTER — Inpatient Hospital Stay (HOSPITAL_BASED_OUTPATIENT_CLINIC_OR_DEPARTMENT_OTHER): Payer: Medicare Other | Admitting: Oncology

## 2021-02-21 ENCOUNTER — Other Ambulatory Visit: Payer: Self-pay

## 2021-02-21 VITALS — BP 147/52 | HR 63 | Temp 96.8°F | Resp 18 | Wt 122.4 lb

## 2021-02-21 DIAGNOSIS — D696 Thrombocytopenia, unspecified: Secondary | ICD-10-CM | POA: Diagnosis not present

## 2021-02-21 DIAGNOSIS — Z85048 Personal history of other malignant neoplasm of rectum, rectosigmoid junction, and anus: Secondary | ICD-10-CM | POA: Insufficient documentation

## 2021-02-21 DIAGNOSIS — C9110 Chronic lymphocytic leukemia of B-cell type not having achieved remission: Secondary | ICD-10-CM | POA: Diagnosis not present

## 2021-02-21 DIAGNOSIS — Z7982 Long term (current) use of aspirin: Secondary | ICD-10-CM | POA: Insufficient documentation

## 2021-02-21 DIAGNOSIS — C9112 Chronic lymphocytic leukemia of B-cell type in relapse: Secondary | ICD-10-CM | POA: Diagnosis not present

## 2021-02-21 DIAGNOSIS — Z79899 Other long term (current) drug therapy: Secondary | ICD-10-CM | POA: Insufficient documentation

## 2021-02-21 DIAGNOSIS — Z7984 Long term (current) use of oral hypoglycemic drugs: Secondary | ICD-10-CM | POA: Insufficient documentation

## 2021-02-21 LAB — CBC WITH DIFFERENTIAL (CANCER CENTER ONLY)
Abs Immature Granulocytes: 0.03 10*3/uL (ref 0.00–0.07)
Basophils Absolute: 0 10*3/uL (ref 0.0–0.1)
Basophils Relative: 0 %
Eosinophils Absolute: 0.1 10*3/uL (ref 0.0–0.5)
Eosinophils Relative: 0 %
HCT: 33.7 % — ABNORMAL LOW (ref 39.0–52.0)
Hemoglobin: 10.7 g/dL — ABNORMAL LOW (ref 13.0–17.0)
Immature Granulocytes: 0 %
Lymphocytes Relative: 88 %
Lymphs Abs: 10.2 10*3/uL — ABNORMAL HIGH (ref 0.7–4.0)
MCH: 29.1 pg (ref 26.0–34.0)
MCHC: 31.8 g/dL (ref 30.0–36.0)
MCV: 91.6 fL (ref 80.0–100.0)
Monocytes Absolute: 0.4 10*3/uL (ref 0.1–1.0)
Monocytes Relative: 3 %
Neutro Abs: 1.1 10*3/uL — ABNORMAL LOW (ref 1.7–7.7)
Neutrophils Relative %: 9 %
Platelet Count: 109 10*3/uL — ABNORMAL LOW (ref 150–400)
RBC: 3.68 MIL/uL — ABNORMAL LOW (ref 4.22–5.81)
RDW: 13.2 % (ref 11.5–15.5)
WBC Count: 11.7 10*3/uL — ABNORMAL HIGH (ref 4.0–10.5)
nRBC: 0 % (ref 0.0–0.2)

## 2021-02-21 LAB — CMP (CANCER CENTER ONLY)
ALT: 8 U/L (ref 0–44)
AST: 12 U/L — ABNORMAL LOW (ref 15–41)
Albumin: 3.5 g/dL (ref 3.5–5.0)
Alkaline Phosphatase: 109 U/L (ref 38–126)
Anion gap: 10 (ref 5–15)
BUN: 27 mg/dL — ABNORMAL HIGH (ref 8–23)
CO2: 25 mmol/L (ref 22–32)
Calcium: 9.5 mg/dL (ref 8.9–10.3)
Chloride: 107 mmol/L (ref 98–111)
Creatinine: 1.92 mg/dL — ABNORMAL HIGH (ref 0.61–1.24)
GFR, Estimated: 35 mL/min — ABNORMAL LOW (ref >60)
Glucose, Bld: 180 mg/dL — ABNORMAL HIGH (ref 70–99)
Potassium: 4.7 mmol/L (ref 3.5–5.1)
Sodium: 142 mmol/L (ref 135–145)
Total Bilirubin: 0.8 mg/dL (ref 0.3–1.2)
Total Protein: 6.1 g/dL — ABNORMAL LOW (ref 6.5–8.1)

## 2021-02-21 NOTE — Progress Notes (Signed)
Hematology and Oncology Follow Up Visit  [name] [id number] [birth year] 80 y.o. 02/21/2021 2:38 PM [name], [name], MD   Principle Diagnosis: 80 year old man with CLL diagnosed in 2013.  He was found to have relapsed disease in 2021 after initial presentation in 2013.  Secondary diagnosis: T2N0 rectal cancer diagnosed in 2009 is currently in remission after surgical resection.    Prior Therapy: He is status post FCR chemotherapy completed on on April of 2013 and accomplished complete response.  He developed relapsed disease and May 2021.   Current therapy: Ibrutinib 420 mg daily started in May 2021.  Interim History: [name] is here for repeat evaluation.  Since the last visit, he reports no major changes in his health.  He continues to tolerate ibrutinib without any complaints.  He denies any nausea vomiting or abdominal pain.  He denies any hospitalizations or illnesses.  He denies any palpitation or bruising.  He denies any bleeding complaints.  He denies any hematochezia, melena or hemoptysis.         Medications: Updated on review. Current Outpatient Medications  Medication Sig Dispense Refill  . amLODipine (NORVASC) 5 MG tablet Take 5 mg by mouth every morning.    Marland Kitchen aspirin 81 MG tablet Take 81 mg by mouth every morning.     Marland Kitchen atorvastatin (LIPITOR) 40 MG tablet Take 40 mg by mouth every evening.     Marland Kitchen guaiFENesin-dextromethorphan (ROBITUSSIN DM) 100-10 MG/5ML syrup Take 5 mLs by mouth every 6 (six) hours as needed for cough. 118 mL 0  . hydroxypropyl methylcellulose / hypromellose (ISOPTO TEARS / GONIOVISC) 2.5 % ophthalmic solution Place 1 drop into both eyes in the morning and at bedtime.    Marland Kitchen ibrutinib (IMBRUVICA) 420 MG TABS TAKE 1 TABLET BY MOUTH DAILY 28 tablet 0  . lisinopril (ZESTRIL) 20 MG tablet Take 20 mg by mouth every morning.    . metFORMIN (GLUCOPHAGE) 1000 MG tablet Take 1,000 mg by mouth 2 (two) times daily with a meal.  0  .  metoprolol tartrate (LOPRESSOR) 25 MG tablet Take 0.5 tablets (12.5 mg total) by mouth 2 (two) times daily. 30 tablet 2  . ondansetron (ZOFRAN-ODT) 8 MG disintegrating tablet TAKE 1 TABLET BY MOUTH EVERY 8 HOURS AS NEEDED FOR NAUSEA AND VOMITING 20 tablet 0  . pantoprazole (PROTONIX) 40 MG tablet Take 1 tablet (40 mg total) by mouth daily. 90 tablet 0  . prochlorperazine (COMPAZINE) 10 MG tablet TAKE 1 TABLET (10 MG TOTAL) BY MOUTH EVERY 6 (SIX) HOURS AS NEEDED FOR NAUSEA OR VOMITING. 30 tablet 1  . SHINGRIX injection     . zolpidem (AMBIEN) 10 MG tablet Take 5 mg by mouth at bedtime as needed for sleep.     No current facility-administered medications for this visit.     Allergies:  Allergies  Allergen Reactions  . Hydrocodone-Acetaminophen Other (See Comments)    Loss of weight     Physical Exam:      Blood pressure (!) 147/52, pulse 63, temperature (!) 96.8 F (36 C), temperature source Tympanic, resp. rate 18, weight 122 lb 6.4 oz (55.5 kg), SpO2 100 %.      ECOG: 1    General appearance: Comfortable appearing without any discomfort Head: Normocephalic without any trauma Oropharynx: Mucous membranes are moist and pink without any thrush or ulcers. Eyes: Pupils are equal and round reactive to light. Lymph nodes: No cervical, supraclavicular, inguinal or axillary lymphadenopathy.   Heart:regular rate and rhythm.  S1  and S2 without leg edema. Lung: Clear without any rhonchi or wheezes.  No dullness to percussion. Abdomin: Soft, nontender, nondistended with good bowel sounds.  No hepatosplenomegaly. Musculoskeletal: No joint deformity or effusion.  Full range of motion noted. Neurological: No deficits noted on motor, sensory and deep tendon reflex exam. Skin: No petechial rash or dryness.  Appeared moist.           Lab Results: Lab Results  Component Value Date   WBC 26.6 (H) 11/22/2020   HGB 9.8 (L) 11/22/2020   HCT 32.1 (L) 11/22/2020   MCV 96.1  11/22/2020   PLT 124 (L) 11/22/2020     Chemistry      Component Value Date/Time   NA 142 11/22/2020 1501   NA 140 07/13/2017 1456   K 4.6 11/22/2020 1501   K 4.6 07/13/2017 1456   CL 108 11/22/2020 1501   CL 104 01/10/2013 1128   CO2 25 11/22/2020 1501   CO2 24 07/13/2017 1456   BUN 25 (H) 11/22/2020 1501   BUN 18.9 07/13/2017 1456   CREATININE 1.85 (H) 11/22/2020 1501   CREATININE 1.2 07/13/2017 1456      Component Value Date/Time   CALCIUM 8.8 (L) 11/22/2020 1501   CALCIUM 9.5 07/13/2017 1456   ALKPHOS 101 11/22/2020 1501   ALKPHOS 121 07/13/2017 1456   AST 11 (L) 11/22/2020 1501   AST 17 07/13/2017 1456   ALT 10 11/22/2020 1501   ALT 19 07/13/2017 1456   BILITOT 0.6 11/22/2020 1501   BILITOT 0.60 07/13/2017 1456             Impression and Plan:  80 year old man with  1.  Relapsed CLL diagnosed in 2021 after initially diagnosed in 2013.    He continues to tolerate ibrutinib without any major complications.  Laboratory data from Feb 21, 2021 were reviewed and continues to show improvement in his white cell count that is near normal at this time.  His hemoglobin is also improving with platelet count remains above 100.  Risks and benefits of continuing this treatment were discussed at this time.  Complications that include bleeding, cardiac or vascular issues among others were reviewed.  For the time being I recommended continuing the same dose and schedule.  I anticipate a longer duration of therapy delays disease in remission.   2.  COVID-19 infection: Fully recovered at this time without any residual issues.  3. Thrombocytopenia: His platelet count is above 100,000 without any active bleeding.   4.  Anorexia: Although he is eating better his weight is slightly down but overall stable.  5. Followup: In 3 months for repeat follow-up.  30  minutes were spent on this visit.  The time was spent on reviewing laboratory data, disease status update, addressing  complications related to his cancer and cancer therapy.   [name], MD 5/27/20222:38 PM

## 2021-03-05 ENCOUNTER — Other Ambulatory Visit (HOSPITAL_COMMUNITY): Payer: Self-pay

## 2021-03-07 ENCOUNTER — Other Ambulatory Visit (HOSPITAL_COMMUNITY): Payer: Self-pay

## 2021-03-07 ENCOUNTER — Other Ambulatory Visit: Payer: Self-pay | Admitting: Oncology

## 2021-03-07 MED ORDER — IMBRUVICA 420 MG PO TABS
1.0000 | ORAL_TABLET | Freq: Every day | ORAL | 0 refills | Status: DC
  Filled 2021-03-07: qty 28, 28d supply, fill #0

## 2021-03-08 ENCOUNTER — Other Ambulatory Visit: Payer: Self-pay | Admitting: Oncology

## 2021-03-13 ENCOUNTER — Other Ambulatory Visit (HOSPITAL_COMMUNITY): Payer: Self-pay

## 2021-03-14 ENCOUNTER — Other Ambulatory Visit: Payer: Self-pay | Admitting: Oncology

## 2021-04-07 ENCOUNTER — Other Ambulatory Visit (HOSPITAL_COMMUNITY): Payer: Self-pay

## 2021-04-07 ENCOUNTER — Other Ambulatory Visit: Payer: Self-pay | Admitting: Oncology

## 2021-04-07 MED ORDER — IMBRUVICA 420 MG PO TABS
1.0000 | ORAL_TABLET | Freq: Every day | ORAL | 0 refills | Status: DC
  Filled 2021-04-07 – 2021-04-09 (×2): qty 28, 28d supply, fill #0

## 2021-04-09 ENCOUNTER — Other Ambulatory Visit (HOSPITAL_COMMUNITY): Payer: Self-pay

## 2021-04-14 DIAGNOSIS — E785 Hyperlipidemia, unspecified: Secondary | ICD-10-CM | POA: Diagnosis not present

## 2021-04-14 DIAGNOSIS — E1169 Type 2 diabetes mellitus with other specified complication: Secondary | ICD-10-CM | POA: Diagnosis not present

## 2021-04-14 DIAGNOSIS — N1832 Chronic kidney disease, stage 3b: Secondary | ICD-10-CM | POA: Diagnosis not present

## 2021-04-16 DIAGNOSIS — Z20822 Contact with and (suspected) exposure to covid-19: Secondary | ICD-10-CM | POA: Diagnosis not present

## 2021-04-21 DIAGNOSIS — E1122 Type 2 diabetes mellitus with diabetic chronic kidney disease: Secondary | ICD-10-CM | POA: Diagnosis not present

## 2021-04-21 DIAGNOSIS — R634 Abnormal weight loss: Secondary | ICD-10-CM | POA: Diagnosis not present

## 2021-04-21 DIAGNOSIS — E785 Hyperlipidemia, unspecified: Secondary | ICD-10-CM | POA: Diagnosis not present

## 2021-04-21 DIAGNOSIS — N184 Chronic kidney disease, stage 4 (severe): Secondary | ICD-10-CM | POA: Diagnosis not present

## 2021-04-21 DIAGNOSIS — I129 Hypertensive chronic kidney disease with stage 1 through stage 4 chronic kidney disease, or unspecified chronic kidney disease: Secondary | ICD-10-CM | POA: Diagnosis not present

## 2021-04-21 DIAGNOSIS — N1832 Chronic kidney disease, stage 3b: Secondary | ICD-10-CM | POA: Diagnosis not present

## 2021-04-21 DIAGNOSIS — C9110 Chronic lymphocytic leukemia of B-cell type not having achieved remission: Secondary | ICD-10-CM | POA: Diagnosis not present

## 2021-04-21 DIAGNOSIS — Z85038 Personal history of other malignant neoplasm of large intestine: Secondary | ICD-10-CM | POA: Diagnosis not present

## 2021-04-22 ENCOUNTER — Other Ambulatory Visit: Payer: Self-pay | Admitting: Internal Medicine

## 2021-04-22 DIAGNOSIS — N1832 Chronic kidney disease, stage 3b: Secondary | ICD-10-CM

## 2021-04-25 ENCOUNTER — Telehealth: Payer: Self-pay | Admitting: Oncology

## 2021-04-25 NOTE — Telephone Encounter (Signed)
Called patient regarding upcoming August appointments, spoke with patient's spouse. Patient will be reminded by My Chart.

## 2021-05-02 ENCOUNTER — Other Ambulatory Visit: Payer: Self-pay | Admitting: Oncology

## 2021-05-05 ENCOUNTER — Other Ambulatory Visit: Payer: Medicare Other

## 2021-05-08 ENCOUNTER — Other Ambulatory Visit (HOSPITAL_COMMUNITY): Payer: Self-pay

## 2021-05-08 ENCOUNTER — Other Ambulatory Visit: Payer: Self-pay | Admitting: Oncology

## 2021-05-08 MED ORDER — IMBRUVICA 420 MG PO TABS
1.0000 | ORAL_TABLET | Freq: Every day | ORAL | 0 refills | Status: DC
  Filled 2021-05-08: qty 28, 28d supply, fill #0

## 2021-05-09 ENCOUNTER — Other Ambulatory Visit: Payer: Medicare Other

## 2021-05-15 ENCOUNTER — Ambulatory Visit
Admission: RE | Admit: 2021-05-15 | Discharge: 2021-05-15 | Disposition: A | Discharge status: Home / Self Care | Payer: Medicare Other | Source: Ambulatory Visit | Attending: Internal Medicine | Admitting: Internal Medicine

## 2021-05-15 DIAGNOSIS — N183 Chronic kidney disease, stage 3 unspecified: Secondary | ICD-10-CM | POA: Diagnosis not present

## 2021-05-15 DIAGNOSIS — N1832 Chronic kidney disease, stage 3b: Secondary | ICD-10-CM

## 2021-05-23 ENCOUNTER — Other Ambulatory Visit: Payer: Self-pay

## 2021-05-23 ENCOUNTER — Inpatient Hospital Stay (HOSPITAL_BASED_OUTPATIENT_CLINIC_OR_DEPARTMENT_OTHER): Payer: Medicare Other | Admitting: Oncology

## 2021-05-23 ENCOUNTER — Inpatient Hospital Stay: Payer: Medicare Other | Attending: Oncology

## 2021-05-23 VITALS — BP 125/53 | HR 79 | Temp 97.8°F | Resp 16 | Ht 67.0 in | Wt 122.9 lb

## 2021-05-23 DIAGNOSIS — R630 Anorexia: Secondary | ICD-10-CM | POA: Diagnosis not present

## 2021-05-23 DIAGNOSIS — Z79899 Other long term (current) drug therapy: Secondary | ICD-10-CM | POA: Insufficient documentation

## 2021-05-23 DIAGNOSIS — C9110 Chronic lymphocytic leukemia of B-cell type not having achieved remission: Secondary | ICD-10-CM

## 2021-05-23 DIAGNOSIS — D649 Anemia, unspecified: Secondary | ICD-10-CM | POA: Insufficient documentation

## 2021-05-23 DIAGNOSIS — Z85048 Personal history of other malignant neoplasm of rectum, rectosigmoid junction, and anus: Secondary | ICD-10-CM | POA: Insufficient documentation

## 2021-05-23 DIAGNOSIS — D6959 Other secondary thrombocytopenia: Secondary | ICD-10-CM | POA: Insufficient documentation

## 2021-05-23 DIAGNOSIS — Z7984 Long term (current) use of oral hypoglycemic drugs: Secondary | ICD-10-CM | POA: Diagnosis not present

## 2021-05-23 DIAGNOSIS — Z7982 Long term (current) use of aspirin: Secondary | ICD-10-CM | POA: Diagnosis not present

## 2021-05-23 LAB — CBC WITH DIFFERENTIAL (CANCER CENTER ONLY)
Abs Immature Granulocytes: 0.02 10*3/uL (ref 0.00–0.07)
Basophils Absolute: 0 10*3/uL (ref 0.0–0.1)
Basophils Relative: 0 %
Eosinophils Absolute: 0 10*3/uL (ref 0.0–0.5)
Eosinophils Relative: 1 %
HCT: 33.4 % — ABNORMAL LOW (ref 39.0–52.0)
Hemoglobin: 10.6 g/dL — ABNORMAL LOW (ref 13.0–17.0)
Immature Granulocytes: 0 %
Lymphocytes Relative: 88 %
Lymphs Abs: 7.2 10*3/uL — ABNORMAL HIGH (ref 0.7–4.0)
MCH: 28.9 pg (ref 26.0–34.0)
MCHC: 31.7 g/dL (ref 30.0–36.0)
MCV: 91 fL (ref 80.0–100.0)
Monocytes Absolute: 0.1 10*3/uL (ref 0.1–1.0)
Monocytes Relative: 2 %
Neutro Abs: 0.7 10*3/uL — ABNORMAL LOW (ref 1.7–7.7)
Neutrophils Relative %: 9 %
Platelet Count: 109 10*3/uL — ABNORMAL LOW (ref 150–400)
RBC: 3.67 MIL/uL — ABNORMAL LOW (ref 4.22–5.81)
RDW: 13.6 % (ref 11.5–15.5)
WBC Count: 8.2 10*3/uL (ref 4.0–10.5)
nRBC: 0 % (ref 0.0–0.2)

## 2021-05-23 LAB — CMP (CANCER CENTER ONLY)
ALT: 10 U/L (ref 0–44)
AST: 14 U/L — ABNORMAL LOW (ref 15–41)
Albumin: 3.1 g/dL — ABNORMAL LOW (ref 3.5–5.0)
Alkaline Phosphatase: 109 U/L (ref 38–126)
Anion gap: 7 (ref 5–15)
BUN: 26 mg/dL — ABNORMAL HIGH (ref 8–23)
CO2: 25 mmol/L (ref 22–32)
Calcium: 8.9 mg/dL (ref 8.9–10.3)
Chloride: 108 mmol/L (ref 98–111)
Creatinine: 2.13 mg/dL — ABNORMAL HIGH (ref 0.61–1.24)
GFR, Estimated: 31 mL/min — ABNORMAL LOW (ref >60)
Glucose, Bld: 247 mg/dL — ABNORMAL HIGH (ref 70–99)
Potassium: 4.4 mmol/L (ref 3.5–5.1)
Sodium: 140 mmol/L (ref 135–145)
Total Bilirubin: 1 mg/dL (ref 0.3–1.2)
Total Protein: 5.6 g/dL — ABNORMAL LOW (ref 6.5–8.1)

## 2021-05-23 NOTE — Progress Notes (Signed)
Hematology and Oncology Follow Up Visit  [name] [id number] 1940-10-10 80 y.o. 05/23/2021 9:04 AM [name], [name], MD   Principle Diagnosis: 80 year old man with CLL presented with lymphocytosis in 2013.  He developed relapsed disease in 2021.  Secondary diagnosis: T2N0 rectal cancer diagnosed in 2009 is currently in remission after surgical resection.    Prior Therapy: He is status post FCR chemotherapy completed on on April of 2013 and accomplished complete response.  He developed relapsed disease and May 2021.   Current therapy: Ibrutinib 420 mg daily started in May 2021.  Interim History: [name] returns today for a follow-up visit.  Since the last visit, he reports no major changes in his health.  He continues to tolerate ibrutinib without any new complaints.  He denies any nausea, fatigue or palpitation.  He denies any hematochezia, melena or bruising.  His appetite continues to improve and gained more weight since last visit.  He denies any lymphadenopathy or constitutional symptoms.         Medications: Unchanged on review. Current Outpatient Medications  Medication Sig Dispense Refill   amLODipine (NORVASC) 5 MG tablet Take 5 mg by mouth every morning.     aspirin 81 MG tablet Take 81 mg by mouth every morning.      atorvastatin (LIPITOR) 40 MG tablet Take 40 mg by mouth every evening.      guaiFENesin-dextromethorphan (ROBITUSSIN DM) 100-10 MG/5ML syrup Take 5 mLs by mouth every 6 (six) hours as needed for cough. 118 mL 0   hydroxypropyl methylcellulose / hypromellose (ISOPTO TEARS / GONIOVISC) 2.5 % ophthalmic solution Place 1 drop into both eyes in the morning and at bedtime.     ibrutinib (IMBRUVICA) 420 MG TABS TAKE 1 TABLET BY MOUTH DAILY 28 tablet 0   lisinopril (ZESTRIL) 20 MG tablet Take 20 mg by mouth every morning.     metFORMIN (GLUCOPHAGE) 1000 MG tablet Take 1,000 mg by mouth 2 (two) times daily with a meal.  0   metoprolol tartrate  (LOPRESSOR) 25 MG tablet Take 0.5 tablets (12.5 mg total) by mouth 2 (two) times daily. 30 tablet 2   ondansetron (ZOFRAN-ODT) 8 MG disintegrating tablet TAKE 1 TABLET BY MOUTH EVERY 8 HOURS AS NEEDED FOR NAUSEA AND VOMITING 20 tablet 0   pantoprazole (PROTONIX) 40 MG tablet Take 1 tablet (40 mg total) by mouth daily. 90 tablet 0   prochlorperazine (COMPAZINE) 10 MG tablet TAKE 1 TABLET BY MOUTH EVERY 6 HOURS AS NEEDED FOR NAUSEA OR VOMITING. 30 tablet 1   SHINGRIX injection      zolpidem (AMBIEN) 10 MG tablet Take 5 mg by mouth at bedtime as needed for sleep.     No current facility-administered medications for this visit.     Allergies:  Allergies  Allergen Reactions   Hydrocodone-Acetaminophen Other (See Comments)    Loss of weight     Physical Exam:      Blood pressure (!) 125/53, pulse 79, temperature 97.8 F (36.6 C), temperature source Oral, resp. rate 16, height 5\' 7"  (1.702 m), weight 122 lb 14.4 oz (55.7 kg), SpO2 100 %.       ECOG: 1    General appearance: Alert, awake without any distress. Head: Atraumatic without abnormalities Oropharynx: Without any thrush or ulcers. Eyes: No scleral icterus. Lymph nodes: No lymphadenopathy noted in the cervical, supraclavicular, or axillary nodes Heart:regular rate and rhythm, without any murmurs or gallops.   Lung: Clear to auscultation without any rhonchi, wheezes or  dullness to percussion. Abdomin: Soft, nontender without any shifting dullness or ascites. Musculoskeletal: No clubbing or cyanosis. Neurological: No motor or sensory deficits. Skin: No rashes or lesions.           Lab Results: Lab Results  Component Value Date   WBC 11.7 (H) 02/21/2021   HGB 10.7 (L) 02/21/2021   HCT 33.7 (L) 02/21/2021   MCV 91.6 02/21/2021   PLT 109 (L) 02/21/2021     Chemistry      Component Value Date/Time   NA 142 02/21/2021 1433   NA 140 07/13/2017 1456   K 4.7 02/21/2021 1433   K 4.6 07/13/2017 1456   CL  107 02/21/2021 1433   CL 104 01/10/2013 1128   CO2 25 02/21/2021 1433   CO2 24 07/13/2017 1456   BUN 27 (H) 02/21/2021 1433   BUN 18.9 07/13/2017 1456   CREATININE 1.92 (H) 02/21/2021 1433   CREATININE 1.2 07/13/2017 1456      Component Value Date/Time   CALCIUM 9.5 02/21/2021 1433   CALCIUM 9.5 07/13/2017 1456   ALKPHOS 109 02/21/2021 1433   ALKPHOS 121 07/13/2017 1456   AST 12 (L) 02/21/2021 1433   AST 17 07/13/2017 1456   ALT 8 02/21/2021 1433   ALT 19 07/13/2017 1456   BILITOT 0.8 02/21/2021 1433   BILITOT 0.60 07/13/2017 1456               Impression and Plan:  80 year old man with  1.  CLL diagnosed in 2013.  He developed relapsed disease with lymphocytosis in 2021.  His disease status was updated at this time laboratory testing reviewed in the last year.  His lymphocytosis continues to improve on the current therapy.  Risks and benefits of continuing ibrutinib were discussed.  His complication occluding bleeding cardiac arrhythmia were reviewed.  Alternative treatment options including venetoclax, systemic chemotherapy among other options were reiterated.   At this time I recommended continuing current treatment.   2.  Anemia: Related to his CLL and continues to improve with therapy.  He does not require any transfusion or growth factor support.   3. Thrombocytopenia: Related to CLL and continues to improve at this time with treatments.  No bleeding noted at this time.   4.  Anorexia: Continues to improve and is gaining more weight.   5. Followup: She will return in 3 months for a follow-up evaluation.  30  minutes were dedicated to this encounter.  The time was spent on reviewing disease status, reviewing laboratory data, treatment choices and future plan of care discussion.   [name], MD 8/26/20229:04 AM

## 2021-06-03 ENCOUNTER — Other Ambulatory Visit (HOSPITAL_COMMUNITY): Payer: Self-pay

## 2021-06-03 ENCOUNTER — Other Ambulatory Visit: Payer: Self-pay | Admitting: Oncology

## 2021-06-03 MED ORDER — IMBRUVICA 420 MG PO TABS
1.0000 | ORAL_TABLET | Freq: Every day | ORAL | 0 refills | Status: DC
  Filled 2021-06-03: qty 28, 28d supply, fill #0

## 2021-06-05 ENCOUNTER — Other Ambulatory Visit (HOSPITAL_COMMUNITY): Payer: Self-pay

## 2021-06-20 ENCOUNTER — Other Ambulatory Visit: Payer: Self-pay | Admitting: Oncology

## 2021-06-20 DIAGNOSIS — Z23 Encounter for immunization: Secondary | ICD-10-CM | POA: Diagnosis not present

## 2021-06-20 DIAGNOSIS — C9110 Chronic lymphocytic leukemia of B-cell type not having achieved remission: Secondary | ICD-10-CM

## 2021-06-23 DIAGNOSIS — E785 Hyperlipidemia, unspecified: Secondary | ICD-10-CM | POA: Diagnosis not present

## 2021-06-23 DIAGNOSIS — N1832 Chronic kidney disease, stage 3b: Secondary | ICD-10-CM | POA: Diagnosis not present

## 2021-06-23 DIAGNOSIS — K219 Gastro-esophageal reflux disease without esophagitis: Secondary | ICD-10-CM | POA: Diagnosis not present

## 2021-06-23 DIAGNOSIS — E46 Unspecified protein-calorie malnutrition: Secondary | ICD-10-CM | POA: Diagnosis not present

## 2021-06-23 DIAGNOSIS — C9110 Chronic lymphocytic leukemia of B-cell type not having achieved remission: Secondary | ICD-10-CM | POA: Diagnosis not present

## 2021-06-23 DIAGNOSIS — Z85038 Personal history of other malignant neoplasm of large intestine: Secondary | ICD-10-CM | POA: Diagnosis not present

## 2021-06-23 DIAGNOSIS — E1122 Type 2 diabetes mellitus with diabetic chronic kidney disease: Secondary | ICD-10-CM | POA: Diagnosis not present

## 2021-06-23 DIAGNOSIS — I129 Hypertensive chronic kidney disease with stage 1 through stage 4 chronic kidney disease, or unspecified chronic kidney disease: Secondary | ICD-10-CM | POA: Diagnosis not present

## 2021-06-23 DIAGNOSIS — D509 Iron deficiency anemia, unspecified: Secondary | ICD-10-CM | POA: Diagnosis not present

## 2021-06-26 ENCOUNTER — Other Ambulatory Visit: Payer: Self-pay | Admitting: Oncology

## 2021-06-26 ENCOUNTER — Other Ambulatory Visit (HOSPITAL_COMMUNITY): Payer: Self-pay

## 2021-06-26 MED ORDER — IMBRUVICA 420 MG PO TABS
1.0000 | ORAL_TABLET | Freq: Every day | ORAL | 0 refills | Status: DC
  Filled 2021-06-26: qty 28, 28d supply, fill #0

## 2021-06-30 ENCOUNTER — Other Ambulatory Visit (HOSPITAL_COMMUNITY): Payer: Self-pay

## 2021-07-24 ENCOUNTER — Other Ambulatory Visit: Payer: Self-pay | Admitting: Oncology

## 2021-07-24 ENCOUNTER — Other Ambulatory Visit (HOSPITAL_COMMUNITY): Payer: Self-pay

## 2021-07-24 MED ORDER — IMBRUVICA 420 MG PO TABS
1.0000 | ORAL_TABLET | Freq: Every day | ORAL | 0 refills | Status: DC
  Filled 2021-08-01: qty 28, 28d supply, fill #0

## 2021-07-28 ENCOUNTER — Other Ambulatory Visit (HOSPITAL_COMMUNITY): Payer: Self-pay

## 2021-08-01 ENCOUNTER — Other Ambulatory Visit (HOSPITAL_COMMUNITY): Payer: Self-pay

## 2021-08-13 ENCOUNTER — Other Ambulatory Visit: Payer: Self-pay | Admitting: Oncology

## 2021-08-13 DIAGNOSIS — C9110 Chronic lymphocytic leukemia of B-cell type not having achieved remission: Secondary | ICD-10-CM

## 2021-08-15 ENCOUNTER — Inpatient Hospital Stay (HOSPITAL_BASED_OUTPATIENT_CLINIC_OR_DEPARTMENT_OTHER): Payer: Medicare Other | Admitting: Oncology

## 2021-08-15 ENCOUNTER — Inpatient Hospital Stay: Payer: Medicare Other | Attending: Oncology

## 2021-08-15 ENCOUNTER — Other Ambulatory Visit: Payer: Self-pay

## 2021-08-15 VITALS — BP 118/75 | HR 106 | Temp 97.5°F | Resp 16 | Wt 122.2 lb

## 2021-08-15 DIAGNOSIS — Z7982 Long term (current) use of aspirin: Secondary | ICD-10-CM | POA: Diagnosis not present

## 2021-08-15 DIAGNOSIS — Z7984 Long term (current) use of oral hypoglycemic drugs: Secondary | ICD-10-CM | POA: Insufficient documentation

## 2021-08-15 DIAGNOSIS — Z79899 Other long term (current) drug therapy: Secondary | ICD-10-CM | POA: Insufficient documentation

## 2021-08-15 DIAGNOSIS — Z85048 Personal history of other malignant neoplasm of rectum, rectosigmoid junction, and anus: Secondary | ICD-10-CM | POA: Insufficient documentation

## 2021-08-15 DIAGNOSIS — C9110 Chronic lymphocytic leukemia of B-cell type not having achieved remission: Secondary | ICD-10-CM

## 2021-08-15 DIAGNOSIS — R630 Anorexia: Secondary | ICD-10-CM | POA: Diagnosis not present

## 2021-08-15 DIAGNOSIS — C9112 Chronic lymphocytic leukemia of B-cell type in relapse: Secondary | ICD-10-CM | POA: Insufficient documentation

## 2021-08-15 LAB — CMP (CANCER CENTER ONLY)
ALT: 9 U/L (ref 0–44)
AST: 14 U/L — ABNORMAL LOW (ref 15–41)
Albumin: 3.3 g/dL — ABNORMAL LOW (ref 3.5–5.0)
Alkaline Phosphatase: 163 U/L — ABNORMAL HIGH (ref 38–126)
Anion gap: 9 (ref 5–15)
BUN: 23 mg/dL (ref 8–23)
CO2: 25 mmol/L (ref 22–32)
Calcium: 8.8 mg/dL — ABNORMAL LOW (ref 8.9–10.3)
Chloride: 105 mmol/L (ref 98–111)
Creatinine: 2.18 mg/dL — ABNORMAL HIGH (ref 0.61–1.24)
GFR, Estimated: 30 mL/min — ABNORMAL LOW (ref >60)
Glucose, Bld: 367 mg/dL — ABNORMAL HIGH (ref 70–99)
Potassium: 4.2 mmol/L (ref 3.5–5.1)
Sodium: 139 mmol/L (ref 135–145)
Total Bilirubin: 1.3 mg/dL — ABNORMAL HIGH (ref 0.3–1.2)
Total Protein: 6.1 g/dL — ABNORMAL LOW (ref 6.5–8.1)

## 2021-08-15 LAB — CBC WITH DIFFERENTIAL (CANCER CENTER ONLY)
Abs Immature Granulocytes: 0.07 10*3/uL (ref 0.00–0.07)
Basophils Absolute: 0 10*3/uL (ref 0.0–0.1)
Basophils Relative: 0 %
Eosinophils Absolute: 0.1 10*3/uL (ref 0.0–0.5)
Eosinophils Relative: 1 %
HCT: 35.4 % — ABNORMAL LOW (ref 39.0–52.0)
Hemoglobin: 11.3 g/dL — ABNORMAL LOW (ref 13.0–17.0)
Immature Granulocytes: 2 %
Lymphocytes Relative: 74 %
Lymphs Abs: 3.1 10*3/uL (ref 0.7–4.0)
MCH: 29.2 pg (ref 26.0–34.0)
MCHC: 31.9 g/dL (ref 30.0–36.0)
MCV: 91.5 fL (ref 80.0–100.0)
Monocytes Absolute: 0.2 10*3/uL (ref 0.1–1.0)
Monocytes Relative: 5 %
Neutro Abs: 0.7 10*3/uL — ABNORMAL LOW (ref 1.7–7.7)
Neutrophils Relative %: 18 %
Platelet Count: 94 10*3/uL — ABNORMAL LOW (ref 150–400)
RBC: 3.87 MIL/uL — ABNORMAL LOW (ref 4.22–5.81)
RDW: 13.2 % (ref 11.5–15.5)
WBC Count: 4.1 10*3/uL (ref 4.0–10.5)
nRBC: 0 % (ref 0.0–0.2)

## 2021-08-15 NOTE — Progress Notes (Signed)
Hematology and Oncology Follow Up Visit  Glenroy Crossen [id number] [birth year] 80 y.o. 08/15/2021 9:00 AM [name], [name], MD   Principle Diagnosis: 80 year old man with relapsed CLL confirmed in 2021.  He initially presented with lymphocytosis in 2013 and his original diagnosis.  Secondary diagnosis: T2N0 rectal cancer diagnosed in 2009 is currently in remission after surgical resection.    Prior Therapy: He is status post FCR chemotherapy completed on on April of 2013 and accomplished complete response.  He developed relapsed disease and May 2021.   Current therapy: Ibrutinib 420 mg daily started in May 2021.  Interim History: Mr. Wah is here for a follow-up evaluation.  Since the last visit, he reports no major changes in his health.  He continues to tolerate ibrutinib without any major complaints.  He denies any nausea, vomiting or abdominal pain.  He does report that marginal appetite and his weight is stable overall.          Medications: Updated on review. Current Outpatient Medications  Medication Sig Dispense Refill   amLODipine (NORVASC) 5 MG tablet Take 5 mg by mouth every morning.     aspirin 81 MG tablet Take 81 mg by mouth every morning.      atorvastatin (LIPITOR) 40 MG tablet Take 40 mg by mouth every evening.      guaiFENesin-dextromethorphan (ROBITUSSIN DM) 100-10 MG/5ML syrup Take 5 mLs by mouth every 6 (six) hours as needed for cough. 118 mL 0   hydroxypropyl methylcellulose / hypromellose (ISOPTO TEARS / GONIOVISC) 2.5 % ophthalmic solution Place 1 drop into both eyes in the morning and at bedtime.     ibrutinib (IMBRUVICA) 420 MG TABS TAKE 1 TABLET BY MOUTH DAILY 28 tablet 0   lisinopril (ZESTRIL) 20 MG tablet Take 20 mg by mouth every morning.     metFORMIN (GLUCOPHAGE) 1000 MG tablet Take 1,000 mg by mouth 2 (two) times daily with a meal.  0   metoprolol tartrate (LOPRESSOR) 25 MG tablet Take 0.5 tablets (12.5 mg total) by mouth 2 (two)  times daily. 30 tablet 2   ondansetron (ZOFRAN-ODT) 8 MG disintegrating tablet TAKE 1 TABLET BY MOUTH EVERY 8 HOURS AS NEEDED FOR NAUSEA AND VOMITING 20 tablet 0   pantoprazole (PROTONIX) 40 MG tablet Take 1 tablet (40 mg total) by mouth daily. 90 tablet 0   prochlorperazine (COMPAZINE) 10 MG tablet TAKE 1 TABLET BY MOUTH EVERY 6 HOURS AS NEEDED FOR NAUSEA OR VOMITING. 30 tablet 1   SHINGRIX injection      zolpidem (AMBIEN) 10 MG tablet Take 5 mg by mouth at bedtime as needed for sleep.     No current facility-administered medications for this visit.     Allergies:  Allergies  Allergen Reactions   Hydrocodone-Acetaminophen Other (See Comments)    Loss of weight     Physical Exam:      Blood pressure 118/75, pulse (!) 106, temperature (!) 97.5 F (36.4 C), temperature source Temporal, resp. rate 16, weight 122 lb 4 oz (55.5 kg), SpO2 100 %.        ECOG: 1     General appearance: Comfortable appearing without any discomfort Head: Normocephalic without any trauma Oropharynx: Mucous membranes are moist and pink without any thrush or ulcers. Eyes: Pupils are equal and round reactive to light. Lymph nodes: No cervical, supraclavicular, inguinal or axillary lymphadenopathy.   Heart:regular rate and rhythm.  S1 and S2 without leg edema. Lung: Clear without any rhonchi or wheezes.  No dullness  to percussion. Abdomin: Soft, nontender, nondistended with good bowel sounds.  No hepatosplenomegaly. Musculoskeletal: No joint deformity or effusion.  Full range of motion noted. Neurological: No deficits noted on motor, sensory and deep tendon reflex exam. Skin: No petechial rash or dryness.  Appeared moist.             Lab Results: Lab Results  Component Value Date   WBC 8.2 05/23/2021   HGB 10.6 (L) 05/23/2021   HCT 33.4 (L) 05/23/2021   MCV 91.0 05/23/2021   PLT 109 (L) 05/23/2021     Chemistry      Component Value Date/Time   NA 140 05/23/2021 0916   NA 140  07/13/2017 1456   K 4.4 05/23/2021 0916   K 4.6 07/13/2017 1456   CL 108 05/23/2021 0916   CL 104 01/10/2013 1128   CO2 25 05/23/2021 0916   CO2 24 07/13/2017 1456   BUN 26 (H) 05/23/2021 0916   BUN 18.9 07/13/2017 1456   CREATININE 2.13 (H) 05/23/2021 0916   CREATININE 1.2 07/13/2017 1456      Component Value Date/Time   CALCIUM 8.9 05/23/2021 0916   CALCIUM 9.5 07/13/2017 1456   ALKPHOS 109 05/23/2021 0916   ALKPHOS 121 07/13/2017 1456   AST 14 (L) 05/23/2021 0916   AST 17 07/13/2017 1456   ALT 10 05/23/2021 0916   ALT 19 07/13/2017 1456   BILITOT 1.0 05/23/2021 0916   BILITOT 0.60 07/13/2017 1456               Impression and Plan:  80 year old man with  1.  Relapsed CLL noted in 2021.  He presented with lymphocytosis in 2013.   The natural course of this disease was reviewed at this time and treatment choices were discussed.  He has achieved and here in hematological remission on ibrutinib without any major complaints.  Risks and benefits of continuing this treatment were reviewed.  Complications include bleeding, palpitation among others as well as cardiac issues were discussed.  After discussion today he is agreeable to proceed.  Alternative treatment options including systemic chemotherapy.  He is agreeable to continue at this time.  The duration of therapy was discussed today and at this time I recommended indefinite treatment with the consideration of treatment break in the future.   2.  Anemia: Related to CLL and renal insufficiency.   3. Thrombocytopenia: Related to CLL and overall remains mild and asymptomatic.   4.  Anorexia: Appears to be multifactorial in nature and overall his appetite and weight is stable.  Continue to emphasize the importance of adequate nutrition in the interim.   5. Followup: In 3 months for repeat follow-up.  30  minutes were spent on this visit.  The time was dedicated to reviewing laboratory data, disease status update and  treatment choices for the future.   [name], MD 11/18/20229:00 AM

## 2021-08-26 ENCOUNTER — Other Ambulatory Visit (HOSPITAL_COMMUNITY): Payer: Self-pay

## 2021-08-27 DIAGNOSIS — Z1389 Encounter for screening for other disorder: Secondary | ICD-10-CM | POA: Diagnosis not present

## 2021-08-27 DIAGNOSIS — E46 Unspecified protein-calorie malnutrition: Secondary | ICD-10-CM | POA: Diagnosis not present

## 2021-08-27 DIAGNOSIS — C9110 Chronic lymphocytic leukemia of B-cell type not having achieved remission: Secondary | ICD-10-CM | POA: Diagnosis not present

## 2021-08-27 DIAGNOSIS — D696 Thrombocytopenia, unspecified: Secondary | ICD-10-CM | POA: Diagnosis not present

## 2021-08-27 DIAGNOSIS — I129 Hypertensive chronic kidney disease with stage 1 through stage 4 chronic kidney disease, or unspecified chronic kidney disease: Secondary | ICD-10-CM | POA: Diagnosis not present

## 2021-08-27 DIAGNOSIS — G4700 Insomnia, unspecified: Secondary | ICD-10-CM | POA: Diagnosis not present

## 2021-08-27 DIAGNOSIS — Z0000 Encounter for general adult medical examination without abnormal findings: Secondary | ICD-10-CM | POA: Diagnosis not present

## 2021-08-27 DIAGNOSIS — K219 Gastro-esophageal reflux disease without esophagitis: Secondary | ICD-10-CM | POA: Diagnosis not present

## 2021-08-27 DIAGNOSIS — N1831 Chronic kidney disease, stage 3a: Secondary | ICD-10-CM | POA: Diagnosis not present

## 2021-08-27 DIAGNOSIS — E7800 Pure hypercholesterolemia, unspecified: Secondary | ICD-10-CM | POA: Diagnosis not present

## 2021-08-27 DIAGNOSIS — E1169 Type 2 diabetes mellitus with other specified complication: Secondary | ICD-10-CM | POA: Diagnosis not present

## 2021-08-27 DIAGNOSIS — N529 Male erectile dysfunction, unspecified: Secondary | ICD-10-CM | POA: Diagnosis not present

## 2021-08-29 ENCOUNTER — Other Ambulatory Visit (HOSPITAL_COMMUNITY): Payer: Self-pay

## 2021-08-29 ENCOUNTER — Other Ambulatory Visit: Payer: Self-pay | Admitting: Oncology

## 2021-08-29 MED ORDER — IMBRUVICA 420 MG PO TABS
1.0000 | ORAL_TABLET | Freq: Every day | ORAL | 0 refills | Status: DC
  Filled 2021-08-29: qty 28, 28d supply, fill #0

## 2021-08-31 ENCOUNTER — Other Ambulatory Visit (HOSPITAL_COMMUNITY): Payer: Self-pay

## 2021-09-02 DIAGNOSIS — Z20822 Contact with and (suspected) exposure to covid-19: Secondary | ICD-10-CM | POA: Diagnosis not present

## 2021-09-04 ENCOUNTER — Telehealth: Payer: Self-pay | Admitting: *Deleted

## 2021-09-04 DIAGNOSIS — N1832 Chronic kidney disease, stage 3b: Secondary | ICD-10-CM | POA: Diagnosis not present

## 2021-09-04 DIAGNOSIS — D509 Iron deficiency anemia, unspecified: Secondary | ICD-10-CM | POA: Diagnosis not present

## 2021-09-04 NOTE — Telephone Encounter (Signed)
Pt wife called to advise about grant that husband is receiving from the [employer]. She is asking if it is time for renewal. In basket was sent to [place] [place], to f/u with wife.

## 2021-09-05 ENCOUNTER — Telehealth: Payer: Self-pay

## 2021-09-05 ENCOUNTER — Other Ambulatory Visit (HOSPITAL_COMMUNITY): Payer: Self-pay

## 2021-09-05 NOTE — Telephone Encounter (Signed)
Oral Oncology Patient Advocate Encounter  Was successful in securing patient a $8000 grant from [employer] to provide copayment coverage for Imbruvica.  This will keep the out of pocket expense at $0.     [employer] ID: [id number]  I have spoken with the patient.   The billing information is as follows and has been shared with [place]: [id number] PCN: [id number] Member ID: [id number] Group ID: [id number] Dates of Eligibility: 08/06/21 through 08/05/22  Fund:  [facility] [place] Phone [phone] Fax [phone] 09/05/2021 10:18 AM

## 2021-09-08 DIAGNOSIS — E46 Unspecified protein-calorie malnutrition: Secondary | ICD-10-CM | POA: Diagnosis not present

## 2021-09-08 DIAGNOSIS — N1832 Chronic kidney disease, stage 3b: Secondary | ICD-10-CM | POA: Diagnosis not present

## 2021-09-08 DIAGNOSIS — C9110 Chronic lymphocytic leukemia of B-cell type not having achieved remission: Secondary | ICD-10-CM | POA: Diagnosis not present

## 2021-09-08 DIAGNOSIS — K219 Gastro-esophageal reflux disease without esophagitis: Secondary | ICD-10-CM | POA: Diagnosis not present

## 2021-09-08 DIAGNOSIS — E1122 Type 2 diabetes mellitus with diabetic chronic kidney disease: Secondary | ICD-10-CM | POA: Diagnosis not present

## 2021-09-08 DIAGNOSIS — I129 Hypertensive chronic kidney disease with stage 1 through stage 4 chronic kidney disease, or unspecified chronic kidney disease: Secondary | ICD-10-CM | POA: Diagnosis not present

## 2021-09-08 DIAGNOSIS — D509 Iron deficiency anemia, unspecified: Secondary | ICD-10-CM | POA: Diagnosis not present

## 2021-09-08 DIAGNOSIS — E785 Hyperlipidemia, unspecified: Secondary | ICD-10-CM | POA: Diagnosis not present

## 2021-09-24 ENCOUNTER — Other Ambulatory Visit: Payer: Self-pay | Admitting: Oncology

## 2021-09-24 ENCOUNTER — Other Ambulatory Visit (HOSPITAL_COMMUNITY): Payer: Self-pay

## 2021-09-24 MED ORDER — IMBRUVICA 420 MG PO TABS
1.0000 | ORAL_TABLET | Freq: Every day | ORAL | 0 refills | Status: DC
  Filled 2021-09-24: qty 28, 28d supply, fill #0

## 2021-09-25 ENCOUNTER — Other Ambulatory Visit (HOSPITAL_COMMUNITY): Payer: Self-pay

## 2021-09-29 ENCOUNTER — Other Ambulatory Visit: Payer: Self-pay | Admitting: Oncology

## 2021-09-29 DIAGNOSIS — C9110 Chronic lymphocytic leukemia of B-cell type not having achieved remission: Secondary | ICD-10-CM

## 2021-10-15 ENCOUNTER — Encounter: Payer: Self-pay | Admitting: Nurse Practitioner

## 2021-10-24 ENCOUNTER — Other Ambulatory Visit (HOSPITAL_COMMUNITY): Payer: Self-pay

## 2021-10-24 ENCOUNTER — Other Ambulatory Visit: Payer: Self-pay | Admitting: Oncology

## 2021-10-24 MED ORDER — IMBRUVICA 420 MG PO TABS
1.0000 | ORAL_TABLET | Freq: Every day | ORAL | 0 refills | Status: DC
  Filled 2021-10-24: qty 28, 28d supply, fill #0

## 2021-10-31 ENCOUNTER — Ambulatory Visit: Payer: Medicare Other | Admitting: Nurse Practitioner

## 2021-11-03 ENCOUNTER — Telehealth: Payer: Self-pay | Admitting: Oncology

## 2021-11-03 NOTE — Telephone Encounter (Signed)
Rescheduled 02/17 appointment to 03/07 due to provider pal, called and spoke with patient's spouse. Patient will be notified of upcoming appointment.

## 2021-11-14 ENCOUNTER — Inpatient Hospital Stay: Payer: Medicare Other

## 2021-11-14 ENCOUNTER — Other Ambulatory Visit: Payer: Self-pay | Admitting: Oncology

## 2021-11-14 ENCOUNTER — Inpatient Hospital Stay: Payer: Medicare Other | Admitting: Oncology

## 2021-11-14 ENCOUNTER — Other Ambulatory Visit (HOSPITAL_COMMUNITY): Payer: Self-pay

## 2021-11-14 MED ORDER — IBRUTINIB 420 MG PO TABS
420.0000 mg | ORAL_TABLET | Freq: Every day | ORAL | 0 refills | Status: DC
  Filled 2021-11-14: qty 28, 28d supply, fill #0

## 2021-11-18 ENCOUNTER — Other Ambulatory Visit (HOSPITAL_COMMUNITY): Payer: Self-pay

## 2021-11-18 ENCOUNTER — Other Ambulatory Visit: Payer: Self-pay | Admitting: Oncology

## 2021-11-18 DIAGNOSIS — C9110 Chronic lymphocytic leukemia of B-cell type not having achieved remission: Secondary | ICD-10-CM

## 2021-11-20 ENCOUNTER — Other Ambulatory Visit (HOSPITAL_COMMUNITY): Payer: Self-pay

## 2021-11-21 ENCOUNTER — Encounter: Payer: Self-pay | Admitting: Nurse Practitioner

## 2021-11-21 ENCOUNTER — Ambulatory Visit (INDEPENDENT_AMBULATORY_CARE_PROVIDER_SITE_OTHER): Payer: Medicare Other | Admitting: Nurse Practitioner

## 2021-11-21 VITALS — BP 124/60 | HR 79 | Ht 67.0 in | Wt 121.0 lb

## 2021-11-21 DIAGNOSIS — D509 Iron deficiency anemia, unspecified: Secondary | ICD-10-CM

## 2021-11-21 DIAGNOSIS — R634 Abnormal weight loss: Secondary | ICD-10-CM | POA: Diagnosis not present

## 2021-11-21 MED ORDER — NA SULFATE-K SULFATE-MG SULF 17.5-3.13-1.6 GM/177ML PO SOLN: 1.0000 | Freq: Once | ORAL | 0 refills | Status: AC

## 2021-11-21 NOTE — Patient Instructions (Signed)
If you are age 79 or older, your body mass index should be between 23-30. Your Body mass index is 18.95 kg/m. If this is out of the aforementioned range listed, please consider follow up with your Primary Care Provider.  If you are age 7 or younger, your body mass index should be between 19-25. Your Body mass index is 18.95 kg/m. If this is out of the aformentioned range listed, please consider follow up with your Primary Care Provider.   You have been scheduled for an endoscopy and colonoscopy. Please follow the written instructions given to you at your visit today. Please pick up your prep supplies at the pharmacy within the next 1-3 days. If you use inhalers (even only as needed), please bring them with you on the day of your procedure.   The [facility] providers would like to encourage you to use [facility] to communicate with providers for non-urgent requests or questions.  Due to long hold times on the telephone, sending your provider a message by [facility] may be a faster and more efficient way to get a response.  Please allow 48 business hours for a response.  Please remember that this is for non-urgent requests.   It was a pleasure to see you today!  Thank you for trusting me with your gastrointestinal care!    Tye Savoy , NP

## 2021-11-21 NOTE — Progress Notes (Signed)
ASSESSMENT AND PLAN     # 81 yo male referred by Nephrology for iron deficiency anemia, GERD and weight loss. He has a  history of colon cancer ( ? Year) , s/p resection.  Limited details. Previously followed by by Dr. Oletta Lamas with [facility]. Last colonoscopy was possibly in 2018  we called for records) . Anemia possibly related to CKD and CLL --For evaluation of unexplained weight loss / loss of appetite and anemia will schedule for EGD and colonoscopy. The risks and benefits of EGD and colonoscopy with possible biopsies were discussed with the patient who agrees to proceed.  --Hold iron for 10 days prior to procedures --Unclear about history of GERD as mentioned by Nephrology.  Patient denies any history of GERD even though he is on pantoprazole.  He did not even realize pantoprazole was a medication for acid reflux.  # CLL, recurrent. On Imbruvica. Followed by Dr. Alen Blew  # CKD 3  HISTORY OF PRESENT ILLNESS     Chief Complaint : poor appetite  [name] is a 81 y.o. male with a past medical history significant for DM, CLL ( recurrent), DM, CKD 3, HTN. See PMH below for any additional history.   Patient has a remote history of colon cancer, previously followed by Dr. Oletta Lamas with [facility]. Last colonoscopy in Epic was in 2013 ( normal) but patient believes he had one more recent.   He is referred by Dr. Joylene Grapes with [facility] for GERD and iron deficiency anemia. He also has malnutrition.  He lost about 15 pounds over last two years but says weight has been stable for last couple of months. He cannot regain any of the weight. He has no appetite. Food doesn't taste good. He doesn't know if loss of appetite could be related to any of his medications. Some in frequent nausea / vomiting. No abdominal pain. No bowel changes or blood in stool. No black stools. Takes 81 mg asa daily, no other NSAIDS. He started himself on iron  about 6 months ago.  Patient says he has no GERD  symptoms. Protonix is on home medications list but he didn't know it was for GERD and never recalls having any symptoms.   ** Labs sent over by Nephrology one 09/04/21 show hgb 11.5, iron sat 16, TIBC 277, ferritin 32,   Data Reviewed:  CBC Latest Ref Rng & Units 08/15/2021 05/23/2021 02/21/2021  WBC 4.0 - 10.5 K/uL 4.1 8.2 11.7(H)  Hemoglobin 13.0 - 17.0 g/dL 11.3(L) 10.6(L) 10.7(L)  Hematocrit 39.0 - 52.0 % 35.4(L) 33.4(L) 33.7(L)  Platelets 150 - 400 K/uL 94(L) 109(L) 109(L)    No results found for: LIPASE CMP Latest Ref Rng & Units 08/15/2021 05/23/2021 02/21/2021  Glucose 70 - 99 mg/dL 367(H) 247(H) 180(H)  BUN 8 - 23 mg/dL 23 26(H) 27(H)  Creatinine 0.61 - 1.24 mg/dL 2.18(H) 2.13(H) 1.92(H)  Sodium 135 - 145 mmol/L 139 140 142  Potassium 3.5 - 5.1 mmol/L 4.2 4.4 4.7  Chloride 98 - 111 mmol/L 105 108 107  CO2 22 - 32 mmol/L 25 25 25   Calcium 8.9 - 10.3 mg/dL 8.8(L) 8.9 9.5  Total Protein 6.5 - 8.1 g/dL 6.1(L) 5.6(L) 6.1(L)  Total Bilirubin 0.3 - 1.2 mg/dL 1.3(H) 1.0 0.8  Alkaline Phos 38 - 126 U/L 163(H) 109 109  AST 15 - 41 U/L 14(L) 14(L) 12(L)  ALT 0 - 44 U/L 9 10 8      Past Medical History:  Diagnosis Date  CLL (chronic lymphocytic leukemia) ([place])    chemotherapy   Colon cancer ([place])    Colorectal cancer ([place])    COLORECTAL DX 2/09, treated surgically   Diabetes mellitus    Hypercholesterolemia    Hypertension    Pneumonia    Bilateral pneumonia     Past Surgical History:  Procedure Laterality Date   HEMICOLECTOMY     nasal polyps     S/P nasal surgery   Family History  Problem Relation Age of Onset   Ovarian cancer Mother    Breast cancer Maternal Aunt    Social History   Tobacco Use   Smoking status: Former    Packs/day: 0.80    Years: 35.00    Pack years: 28.00    Types: Cigarettes   Smokeless tobacco: Never  Vaping Use   Vaping Use: Never used  Substance Use Topics   Alcohol use: No   Drug use: No   Current Outpatient Medications   Medication Sig Dispense Refill   amLODipine (NORVASC) 5 MG tablet Take 5 mg by mouth every morning.     aspirin 81 MG tablet Take 81 mg by mouth every morning.      atorvastatin (LIPITOR) 40 MG tablet Take 40 mg by mouth every evening.      guaiFENesin-dextromethorphan (ROBITUSSIN DM) 100-10 MG/5ML syrup Take 5 mLs by mouth every 6 (six) hours as needed for cough. 118 mL 0   hydroxypropyl methylcellulose / hypromellose (ISOPTO TEARS / GONIOVISC) 2.5 % ophthalmic solution Place 1 drop into both eyes in the morning and at bedtime.     ibrutinib (IMBRUVICA) 420 MG tablet TAKE 1 TABLET BY MOUTH DAILY 28 tablet 0   lisinopril (ZESTRIL) 20 MG tablet Take 20 mg by mouth every morning.     metFORMIN (GLUCOPHAGE) 1000 MG tablet Take 1,000 mg by mouth 2 (two) times daily with a meal.  0   ondansetron (ZOFRAN-ODT) 8 MG disintegrating tablet TAKE 1 TABLET BY MOUTH EVERY 8 HOURS AS NEEDED FOR NAUSEA AND VOMITING 20 tablet 0   prochlorperazine (COMPAZINE) 10 MG tablet TAKE 1 TABLET BY MOUTH EVERY 6 HOURS AS NEEDED FOR NAUSEA OR VOMITING. 30 tablet 1   SHINGRIX injection      zolpidem (AMBIEN) 10 MG tablet Take 5 mg by mouth at bedtime as needed for sleep.     metoprolol tartrate (LOPRESSOR) 25 MG tablet Take 0.5 tablets (12.5 mg total) by mouth 2 (two) times daily. 30 tablet 2   pantoprazole (PROTONIX) 40 MG tablet Take 1 tablet (40 mg total) by mouth daily. 90 tablet 0   No current facility-administered medications for this visit.   Allergies  Allergen Reactions   Hydrocodone-Acetaminophen Other (See Comments)    Loss of weight     Review of Systems: Positive for sleeping problems, swelling of feet and legs, urine leakage.  All other systems reviewed and negative except where noted in HPI.    PHYSICAL EXAM :    Wt Readings from Last 3 Encounters:  11/21/21 121 lb (54.9 kg)  08/15/21 122 lb 4 oz (55.5 kg)  05/23/21 122 lb 14.4 oz (55.7 kg)    BP 124/60    Pulse 79    Ht 5\' 7"  (1.702 m)    Wt  121 lb (54.9 kg)    BMI 18.95 kg/m  Constitutional:  Generally well appearing  thin male in no acute distress. Psychiatric: Pleasant. Normal mood and affect. Behavior is normal. EENT: Pupils normal.  Conjunctivae are normal.  No scleral icterus. Neck supple.  Cardiovascular: Normal rate, regular rhythm. No edema Pulmonary/chest: Effort normal and breath sounds normal. No wheezing, rales or rhonchi. Abdominal: Soft, nondistended, nontender. Bowel sounds active throughout. There are no masses palpable. No hepatomegaly. Neurological: Alert and oriented to person place and time. Skin: Skin is warm and dry. No rashes noted.  Edward Savoy, NP  11/21/2021, 11:47 AM  Cc:  Referring Provider Reesa Chew, MD

## 2021-11-24 DIAGNOSIS — Z20828 Contact with and (suspected) exposure to other viral communicable diseases: Secondary | ICD-10-CM | POA: Diagnosis not present

## 2021-12-01 DIAGNOSIS — N1832 Chronic kidney disease, stage 3b: Secondary | ICD-10-CM | POA: Diagnosis not present

## 2021-12-01 DIAGNOSIS — D509 Iron deficiency anemia, unspecified: Secondary | ICD-10-CM | POA: Diagnosis not present

## 2021-12-01 NOTE — Progress Notes (Signed)
Addendum: Reviewed and agree with assessment and management plan. Letia Guidry M, MD  

## 2021-12-02 ENCOUNTER — Inpatient Hospital Stay: Payer: Medicare Other | Attending: Oncology

## 2021-12-02 ENCOUNTER — Other Ambulatory Visit: Payer: Self-pay

## 2021-12-02 ENCOUNTER — Inpatient Hospital Stay (HOSPITAL_BASED_OUTPATIENT_CLINIC_OR_DEPARTMENT_OTHER): Payer: Medicare Other | Admitting: Oncology

## 2021-12-02 VITALS — BP 135/65 | HR 83 | Temp 97.8°F | Resp 15 | Ht 67.0 in | Wt 120.3 lb

## 2021-12-02 DIAGNOSIS — C9110 Chronic lymphocytic leukemia of B-cell type not having achieved remission: Secondary | ICD-10-CM

## 2021-12-02 DIAGNOSIS — Z85048 Personal history of other malignant neoplasm of rectum, rectosigmoid junction, and anus: Secondary | ICD-10-CM | POA: Insufficient documentation

## 2021-12-02 DIAGNOSIS — N189 Chronic kidney disease, unspecified: Secondary | ICD-10-CM | POA: Insufficient documentation

## 2021-12-02 DIAGNOSIS — Z7984 Long term (current) use of oral hypoglycemic drugs: Secondary | ICD-10-CM | POA: Insufficient documentation

## 2021-12-02 DIAGNOSIS — Z7982 Long term (current) use of aspirin: Secondary | ICD-10-CM | POA: Insufficient documentation

## 2021-12-02 DIAGNOSIS — Z79899 Other long term (current) drug therapy: Secondary | ICD-10-CM | POA: Diagnosis not present

## 2021-12-02 DIAGNOSIS — D631 Anemia in chronic kidney disease: Secondary | ICD-10-CM | POA: Insufficient documentation

## 2021-12-02 DIAGNOSIS — D6959 Other secondary thrombocytopenia: Secondary | ICD-10-CM | POA: Diagnosis not present

## 2021-12-02 LAB — CBC WITH DIFFERENTIAL (CANCER CENTER ONLY)
Abs Immature Granulocytes: 0.14 10*3/uL — ABNORMAL HIGH (ref 0.00–0.07)
Basophils Absolute: 0 10*3/uL (ref 0.0–0.1)
Basophils Relative: 1 %
Eosinophils Absolute: 0.1 10*3/uL (ref 0.0–0.5)
Eosinophils Relative: 3 %
HCT: 34.3 % — ABNORMAL LOW (ref 39.0–52.0)
Hemoglobin: 10.9 g/dL — ABNORMAL LOW (ref 13.0–17.0)
Immature Granulocytes: 5 %
Lymphocytes Relative: 57 %
Lymphs Abs: 1.8 10*3/uL (ref 0.7–4.0)
MCH: 27.4 pg (ref 26.0–34.0)
MCHC: 31.8 g/dL (ref 30.0–36.0)
MCV: 86.2 fL (ref 80.0–100.0)
Monocytes Absolute: 0.4 10*3/uL (ref 0.1–1.0)
Monocytes Relative: 14 %
Neutro Abs: 0.6 10*3/uL — ABNORMAL LOW (ref 1.7–7.7)
Neutrophils Relative %: 20 %
Platelet Count: 150 10*3/uL (ref 150–400)
RBC: 3.98 MIL/uL — ABNORMAL LOW (ref 4.22–5.81)
RDW: 13.1 % (ref 11.5–15.5)
WBC Count: 3.1 10*3/uL — ABNORMAL LOW (ref 4.0–10.5)
nRBC: 0 % (ref 0.0–0.2)

## 2021-12-02 LAB — CMP (CANCER CENTER ONLY)
ALT: 7 U/L (ref 0–44)
AST: 12 U/L — ABNORMAL LOW (ref 15–41)
Albumin: 3.7 g/dL (ref 3.5–5.0)
Alkaline Phosphatase: 137 U/L — ABNORMAL HIGH (ref 38–126)
Anion gap: 9 (ref 5–15)
BUN: 34 mg/dL — ABNORMAL HIGH (ref 8–23)
CO2: 27 mmol/L (ref 22–32)
Calcium: 9.4 mg/dL (ref 8.9–10.3)
Chloride: 104 mmol/L (ref 98–111)
Creatinine: 2.55 mg/dL — ABNORMAL HIGH (ref 0.61–1.24)
GFR, Estimated: 25 mL/min — ABNORMAL LOW (ref >60)
Glucose, Bld: 174 mg/dL — ABNORMAL HIGH (ref 70–99)
Potassium: 5.1 mmol/L (ref 3.5–5.1)
Sodium: 140 mmol/L (ref 135–145)
Total Bilirubin: 1 mg/dL (ref 0.3–1.2)
Total Protein: 6.6 g/dL (ref 6.5–8.1)

## 2021-12-02 NOTE — Progress Notes (Signed)
Hematology and Oncology Follow Up Visit ? ?[name] ?[id number] ?09-Jan-1941 81 y.o. ?12/02/2021 2:48 PM ?[name], MD  ? ?Principle Diagnosis: 81 year old man with CLL diagnosed in 2013.he developed relapsed disease in 2021 with lymphocytosis and lymphadenopathy. ? ?Secondary diagnosis: T2N0 rectal cancer diagnosed in 2009 is currently in remission after surgical resection. ? ? ? ?Prior Therapy: He is status post FCR chemotherapy completed on on April of 2013 and accomplished complete response.  He developed relapsed disease and May 2021. ? ? ?Current therapy: Ibrutinib 420 mg daily started in May 2021. ? ?Interim History: [name] is here for a follow-up visit.  Since last visit, he reports no major changes in his health.  He continues to have issues with weight loss and decrease in his appetite despite using appetite stimulants.  He denies any nausea, vomiting or abdominal pain.  He denies any hospitalizations or illnesses.  He denies any lymphadenopathy or constitutional symptoms. ? ? ? ? ? ? ? ? ?Medications: Reviewed without changes. ?Current Outpatient Medications  ?Medication Sig Dispense Refill  ? amLODipine (NORVASC) 5 MG tablet Take 5 mg by mouth every morning.    ? aspirin 81 MG tablet Take 81 mg by mouth every morning.     ? atorvastatin (LIPITOR) 40 MG tablet Take 40 mg by mouth every evening.     ? guaiFENesin-dextromethorphan (ROBITUSSIN DM) 100-10 MG/5ML syrup Take 5 mLs by mouth every 6 (six) hours as needed for cough. 118 mL 0  ? hydroxypropyl methylcellulose / hypromellose (ISOPTO TEARS / GONIOVISC) 2.5 % ophthalmic solution Place 1 drop into both eyes in the morning and at bedtime.    ? ibrutinib (IMBRUVICA) 420 MG tablet TAKE 1 TABLET BY MOUTH DAILY 28 tablet 0  ? lisinopril (ZESTRIL) 20 MG tablet Take 20 mg by mouth every morning.    ? metFORMIN (GLUCOPHAGE) 1000 MG tablet Take 1,000 mg by mouth 2 (two) times daily with a meal.  0  ? metoprolol tartrate (LOPRESSOR) 25  MG tablet Take 0.5 tablets (12.5 mg total) by mouth 2 (two) times daily. 30 tablet 2  ? ondansetron (ZOFRAN-ODT) 8 MG disintegrating tablet TAKE 1 TABLET BY MOUTH EVERY 8 HOURS AS NEEDED FOR NAUSEA AND VOMITING 20 tablet 0  ? pantoprazole (PROTONIX) 40 MG tablet Take 1 tablet (40 mg total) by mouth daily. 90 tablet 0  ? prochlorperazine (COMPAZINE) 10 MG tablet TAKE 1 TABLET BY MOUTH EVERY 6 HOURS AS NEEDED FOR NAUSEA OR VOMITING. 30 tablet 1  ? SHINGRIX injection     ? zolpidem (AMBIEN) 10 MG tablet Take 5 mg by mouth at bedtime as needed for sleep.    ? ?No current facility-administered medications for this visit.  ? ? ? ?Allergies:  ?Allergies  ?Allergen Reactions  ? Hydrocodone-Acetaminophen Other (See Comments)  ?  Loss of weight  ? ? ? ?Physical Exam: ? ? ? ? ? ? ? ? ? ?Blood pressure 135/65, pulse 83, temperature 97.8 ?F (36.6 ?C), temperature source Temporal, resp. rate 15, height '5\' 7"'$  (1.702 m), weight 120 lb 4.8 oz (54.6 kg), SpO2 100 %. ? ? ? ? ?ECOG: 1 ?  ? ?General appearance: Alert, awake without any distress. ?Head: Atraumatic without abnormalities ?Oropharynx: Without any thrush or ulcers. ?Eyes: No scleral icterus. ?Lymph nodes: No lymphadenopathy noted in the cervical, supraclavicular, or axillary nodes ?Heart:regular rate and rhythm, without any murmurs or gallops.   ?Lung: Clear to auscultation without any rhonchi, wheezes or dullness to percussion. ?Abdomin: Soft,  nontender without any shifting dullness or ascites. ?Musculoskeletal: No clubbing or cyanosis. ?Neurological: No motor or sensory deficits. ?Skin: No rashes or lesions. ? ? ? ? ? ? ? ? ? ? ? ?Lab Results: ?Lab Results  ?Component Value Date  ? WBC 4.1 08/15/2021  ? HGB 11.3 (L) 08/15/2021  ? HCT 35.4 (L) 08/15/2021  ? MCV 91.5 08/15/2021  ? PLT 94 (L) 08/15/2021  ? ?  Chemistry   ?   ?Component Value Date/Time  ? NA 139 08/15/2021 0908  ? NA 140 07/13/2017 1456  ? K 4.2 08/15/2021 0908  ? K 4.6 07/13/2017 1456  ? CL 105 08/15/2021  0908  ? CL 104 01/10/2013 1128  ? CO2 25 08/15/2021 0908  ? CO2 24 07/13/2017 1456  ? BUN 23 08/15/2021 0908  ? BUN 18.9 07/13/2017 1456  ? CREATININE 2.18 (H) 08/15/2021 0908  ? CREATININE 1.2 07/13/2017 1456  ?    ?Component Value Date/Time  ? CALCIUM 8.8 (L) 08/15/2021 0908  ? CALCIUM 9.5 07/13/2017 1456  ? ALKPHOS 163 (H) 08/15/2021 0908  ? ALKPHOS 121 07/13/2017 1456  ? AST 14 (L) 08/15/2021 0908  ? AST 17 07/13/2017 1456  ? ALT 9 08/15/2021 0908  ? ALT 19 07/13/2017 1456  ? BILITOT 1.3 (H) 08/15/2021 0908  ? BILITOT 0.60 07/13/2017 1456  ?  ? ? ? ? ?  ?  ? ? ? ?Impression and Plan: ? ?81 year old man with ? ?1.  CLL diagnosed in 2013 with relapsed disease in 2021.   ? ?He has achieved a hematological remission on ibrutinib without any major complications.  Risks and benefits of continuing this treatment versus therapy discontinuation versus switching to [place] were discussed. ? ?Laboratory data from today continue to show a hematological remission with improvement in his lymphocytosis as well as platelet count.  At this time I recommended a treatment break given his preference and weight loss.  We will reevaluate in 3 months and potentially restart it or switch to a different agent given his side effects. ? ? ? ?2.  Anemia: Continues to improve with treatment of his CLL.  His anemia is predominantly related to lymphoproliferative disorder chronic kidney disease. ? ? ?3. Thrombocytopenia: Related to ibrutinib with counts remain acceptable.  No active bleeding noted we will continue to monitor. ? ? ?4.  Anorexia: We have discussed strategies to boost his appetite and stopping ibrutinib should help. ? ? ?5. Followup: In 3 months for repeat follow-up. ? ?30  minutes were dedicated to this encounter.  Time was spent on reviewing laboratory data, disease status update, treatment choices and addressing complications related to cancer and cancer therapy. ? ? ?[name], MD ?3/7/20232:48 PM ?

## 2021-12-06 DIAGNOSIS — Z20822 Contact with and (suspected) exposure to covid-19: Secondary | ICD-10-CM | POA: Diagnosis not present

## 2021-12-08 DIAGNOSIS — Z85038 Personal history of other malignant neoplasm of large intestine: Secondary | ICD-10-CM | POA: Diagnosis not present

## 2021-12-08 DIAGNOSIS — N1832 Chronic kidney disease, stage 3b: Secondary | ICD-10-CM | POA: Diagnosis not present

## 2021-12-08 DIAGNOSIS — E46 Unspecified protein-calorie malnutrition: Secondary | ICD-10-CM | POA: Diagnosis not present

## 2021-12-08 DIAGNOSIS — D509 Iron deficiency anemia, unspecified: Secondary | ICD-10-CM | POA: Diagnosis not present

## 2021-12-08 DIAGNOSIS — E1122 Type 2 diabetes mellitus with diabetic chronic kidney disease: Secondary | ICD-10-CM | POA: Diagnosis not present

## 2021-12-08 DIAGNOSIS — C9110 Chronic lymphocytic leukemia of B-cell type not having achieved remission: Secondary | ICD-10-CM | POA: Diagnosis not present

## 2021-12-08 DIAGNOSIS — I129 Hypertensive chronic kidney disease with stage 1 through stage 4 chronic kidney disease, or unspecified chronic kidney disease: Secondary | ICD-10-CM | POA: Diagnosis not present

## 2021-12-09 ENCOUNTER — Other Ambulatory Visit (HOSPITAL_COMMUNITY): Payer: Self-pay

## 2021-12-31 ENCOUNTER — Other Ambulatory Visit: Payer: Self-pay

## 2021-12-31 DIAGNOSIS — C9110 Chronic lymphocytic leukemia of B-cell type not having achieved remission: Secondary | ICD-10-CM

## 2022-01-01 ENCOUNTER — Other Ambulatory Visit: Payer: Self-pay | Admitting: Oncology

## 2022-01-01 DIAGNOSIS — C9110 Chronic lymphocytic leukemia of B-cell type not having achieved remission: Secondary | ICD-10-CM

## 2022-01-07 DIAGNOSIS — Z20822 Contact with and (suspected) exposure to covid-19: Secondary | ICD-10-CM | POA: Diagnosis not present

## 2022-01-08 ENCOUNTER — Other Ambulatory Visit: Payer: Self-pay | Admitting: *Deleted

## 2022-01-08 DIAGNOSIS — C9110 Chronic lymphocytic leukemia of B-cell type not having achieved remission: Secondary | ICD-10-CM

## 2022-01-08 MED ORDER — ONDANSETRON 8 MG PO TBDP: ORAL_TABLET | ORAL | 0 refills | Status: DC

## 2022-01-12 ENCOUNTER — Ambulatory Visit (AMBULATORY_SURGERY_CENTER): Payer: Medicare Other | Admitting: Internal Medicine

## 2022-01-12 ENCOUNTER — Encounter: Payer: Self-pay | Admitting: Internal Medicine

## 2022-01-12 VITALS — BP 160/83 | HR 104 | Temp 98.4°F | Resp 15 | Ht 67.0 in | Wt 121.0 lb

## 2022-01-12 DIAGNOSIS — B9681 Helicobacter pylori [H. pylori] as the cause of diseases classified elsewhere: Secondary | ICD-10-CM | POA: Diagnosis not present

## 2022-01-12 DIAGNOSIS — D123 Benign neoplasm of transverse colon: Secondary | ICD-10-CM | POA: Diagnosis not present

## 2022-01-12 DIAGNOSIS — D128 Benign neoplasm of rectum: Secondary | ICD-10-CM | POA: Diagnosis not present

## 2022-01-12 DIAGNOSIS — K2950 Unspecified chronic gastritis without bleeding: Secondary | ICD-10-CM | POA: Diagnosis not present

## 2022-01-12 DIAGNOSIS — R634 Abnormal weight loss: Secondary | ICD-10-CM

## 2022-01-12 DIAGNOSIS — D509 Iron deficiency anemia, unspecified: Secondary | ICD-10-CM | POA: Diagnosis not present

## 2022-01-12 DIAGNOSIS — K2970 Gastritis, unspecified, without bleeding: Secondary | ICD-10-CM

## 2022-01-12 DIAGNOSIS — Z85038 Personal history of other malignant neoplasm of large intestine: Secondary | ICD-10-CM

## 2022-01-12 MED ORDER — SODIUM CHLORIDE 0.9 % IV SOLN: 500.0000 mL | Freq: Once | INTRAVENOUS | Status: DC

## 2022-01-12 NOTE — Progress Notes (Signed)
? ?GASTROENTEROLOGY PROCEDURE H&P NOTE  ? ?Primary Care Physician: ?[name], MD ? ? ? ?Reason for Procedure:   IDA, personal hx colon cancer  ? ?Plan:    EGD/colon ? ?Patient is appropriate for endoscopic procedure(s) in the ambulatory ([place]) setting. ? ?The nature of the procedure, as well as the risks, benefits, and alternatives were carefully and thoroughly reviewed with the patient. Ample time for discussion and questions allowed. The patient understood, was satisfied, and agreed to proceed.  ? ? ? ?HPI: ?[name] is a 81 y.o. male who presents for EGD/colon.  Medical history as below.  Tolerated the prep.  No recent chest pain or shortness of breath.  No abdominal pain today. ? ?Past Medical History:  ?Diagnosis Date  ? CLL (chronic lymphocytic leukemia) ([place])   ? chemotherapy  ? Colon cancer ([place])   ? Colorectal cancer ([place])   ? COLORECTAL DX 2/09, treated surgically  ? Diabetes mellitus   ? Hypercholesterolemia   ? Hypertension   ? Pneumonia   ? Bilateral pneumonia  ? ? ?Past Surgical History:  ?Procedure Laterality Date  ? HEMICOLECTOMY    ? nasal polyps    ? S/P nasal surgery  ? ? ?Prior to Admission medications   ?Medication Sig Start Date End Date Taking? Authorizing Provider  ?amLODipine (NORVASC) 5 MG tablet Take 5 mg by mouth every morning. 11/12/19  Yes [provider]  ?aspirin 81 MG tablet Take 81 mg by mouth every morning.    Yes [provider]  ?atorvastatin (LIPITOR) 40 MG tablet Take 40 mg by mouth every evening.  01/10/13  Yes [provider]  ?famotidine (PEPCID) 40 MG tablet Take 40 mg by mouth at bedtime. 11/13/21  Yes [provider]  ?hydroxypropyl methylcellulose / hypromellose (ISOPTO TEARS / GONIOVISC) 2.5 % ophthalmic solution Place 1 drop into both eyes in the morning and at bedtime.   Yes [provider]  ?ibrutinib (IMBRUVICA) 420 MG tablet TAKE 1 TABLET BY MOUTH DAILY 11/14/21 11/14/22 Yes Wyatt Portela, MD  ?lisinopril  (ZESTRIL) 20 MG tablet Take 20 mg by mouth every morning. 12/14/18  Yes [provider]  ?metFORMIN (GLUCOPHAGE) 1000 MG tablet Take 1,000 mg by mouth 2 (two) times daily with a meal. 06/05/17  Yes [provider]  ?ondansetron (ZOFRAN-ODT) 8 MG disintegrating tablet TAKE 1 TABLET BY MOUTH EVERY 8 HOURS AS NEEDED FOR NAUSEA AND VOMITING 01/08/22  Yes Wyatt Portela, MD  ?prochlorperazine (COMPAZINE) 10 MG tablet TAKE 1 TABLET BY MOUTH EVERY 6 HOURS AS NEEDED FOR NAUSEA OR VOMITING. 01/01/22  Yes Wyatt Portela, MD  ?zolpidem (AMBIEN) 10 MG tablet Take 5 mg by mouth at bedtime as needed for sleep. 10/31/19  Yes [provider]  ?guaiFENesin-dextromethorphan (ROBITUSSIN DM) 100-10 MG/5ML syrup Take 5 mLs by mouth every 6 (six) hours as needed for cough. 10/25/20   Arrien, Jimmy Picket, MD  ?metoprolol tartrate (LOPRESSOR) 25 MG tablet Take 0.5 tablets (12.5 mg total) by mouth 2 (two) times daily. 03/27/19 06/25/19  Amin, Jeanella Flattery, MD  ?pantoprazole (PROTONIX) 40 MG tablet Take 1 tablet (40 mg total) by mouth daily. ?Patient not taking: Reported on 01/12/2022 03/27/19 06/25/19  Damita Lack, MD  ?[facility] injection  08/17/19   [provider]  ? ? ?Current Outpatient Medications  ?Medication Sig Dispense Refill  ? amLODipine (NORVASC) 5 MG tablet Take 5 mg by mouth every morning.    ? aspirin 81 MG tablet Take 81 mg  by mouth every morning.     ? atorvastatin (LIPITOR) 40 MG tablet Take 40 mg by mouth every evening.     ? famotidine (PEPCID) 40 MG tablet Take 40 mg by mouth at bedtime.    ? hydroxypropyl methylcellulose / hypromellose (ISOPTO TEARS / GONIOVISC) 2.5 % ophthalmic solution Place 1 drop into both eyes in the morning and at bedtime.    ? ibrutinib (IMBRUVICA) 420 MG tablet TAKE 1 TABLET BY MOUTH DAILY 28 tablet 0  ? lisinopril (ZESTRIL) 20 MG tablet Take 20 mg by mouth every morning.    ? metFORMIN (GLUCOPHAGE) 1000 MG tablet Take 1,000 mg by mouth 2 (two) times daily  with a meal.  0  ? ondansetron (ZOFRAN-ODT) 8 MG disintegrating tablet TAKE 1 TABLET BY MOUTH EVERY 8 HOURS AS NEEDED FOR NAUSEA AND VOMITING 20 tablet 0  ? prochlorperazine (COMPAZINE) 10 MG tablet TAKE 1 TABLET BY MOUTH EVERY 6 HOURS AS NEEDED FOR NAUSEA OR VOMITING. 30 tablet 1  ? zolpidem (AMBIEN) 10 MG tablet Take 5 mg by mouth at bedtime as needed for sleep.    ? guaiFENesin-dextromethorphan (ROBITUSSIN DM) 100-10 MG/5ML syrup Take 5 mLs by mouth every 6 (six) hours as needed for cough. 118 mL 0  ? metoprolol tartrate (LOPRESSOR) 25 MG tablet Take 0.5 tablets (12.5 mg total) by mouth 2 (two) times daily. 30 tablet 2  ? pantoprazole (PROTONIX) 40 MG tablet Take 1 tablet (40 mg total) by mouth daily. (Patient not taking: Reported on 01/12/2022) 90 tablet 0  ? SHINGRIX injection     ? ?Current Facility-Administered Medications  ?Medication Dose Route Frequency Provider Last Rate Last Admin  ? 0.9 %  sodium chloride infusion  500 mL Intravenous Once Aneliz Carbary, Lajuan Lines, MD      ? ? ?Allergies as of 01/12/2022 - Review Complete 01/12/2022  ?Allergen Reaction Noted  ? Hydrocodone-acetaminophen Other (See Comments) 10/22/2017  ? ? ?Family History  ?Problem Relation Age of Onset  ? Ovarian cancer Mother   ? Breast cancer Maternal Aunt   ? ? ?Social History  ? ?Socioeconomic History  ? Marital status: Married  ?  Spouse name: Not on file  ? Number of children: Not on file  ? Years of education: Not on file  ? Highest education level: Not on file  ?Occupational History  ? Not on file  ?Tobacco Use  ? Smoking status: Former  ?  Packs/day: 0.80  ?  Years: 35.00  ?  Pack years: 28.00  ?  Types: Cigarettes  ? Smokeless tobacco: Never  ?Vaping Use  ? Vaping Use: Never used  ?Substance and Sexual Activity  ? Alcohol use: No  ? Drug use: No  ? Sexual activity: Not on file  ?Other Topics Concern  ? Not on file  ?Social History Narrative  ? Lives with wife.  Drives, does not need a cane or walker to get around,.   ? ?Social  Determinants of Health  ? ?Financial Resource Strain: Not on file  ?Food Insecurity: Not on file  ?Transportation Needs: Not on file  ?Physical Activity: Not on file  ?Stress: Not on file  ?Social Connections: Not on file  ?Intimate Partner Violence: Not on file  ? ? ?Physical Exam: ?Vital signs in last 24 hours: ?'@BP'$  136/71   Pulse (!) 112   Temp 98.4 ?F (36.9 ?C)   Ht '5\' 7"'$  (1.702 m)   Wt 121 lb (54.9 kg)   SpO2 98%   BMI 18.95  kg/m?  ?GEN: NAD ?EYE: Sclerae anicteric ?ENT: MMM ?CV: Non-tachycardic ?Pulm: CTA b/l ?GI: Soft, NT/ND ?NEURO:  Alert & Oriented x 3 ? ? ?Zenovia Jarred, MD ?[facility] ? ?01/12/2022 2:44 PM ? ?

## 2022-01-12 NOTE — Progress Notes (Signed)
VS-CW  Pt's states no medical or surgical changes since previsit or office visit.  

## 2022-01-12 NOTE — Op Note (Signed)
[facility] ?Patient Name: [name] ?Procedure Date: 01/12/2022 2:47 PM ?MRN: [id number] ?Endoscopist: Jerene Bears , MD ?Age: 81 ?Referring MD:  ?Date of Birth: October 06, 1940 ?Gender: Male ?Account #: [id number] ?Procedure:                Colonoscopy ?Indications:              Iron deficiency anemia, Weight loss with anorexia,  ?                          personal history of colon cancer treated/resected  ?                          2009 ?Medicines:                Monitored Anesthesia Care ?Procedure:                Pre-Anesthesia Assessment: ?                          - Prior to the procedure, a History and Physical  ?                          was performed, and patient medications and  ?                          allergies were reviewed. The patient's tolerance of  ?                          previous anesthesia was also reviewed. The risks  ?                          and benefits of the procedure and the sedation  ?                          options and risks were discussed with the patient.  ?                          All questions were answered, and informed consent  ?                          was obtained. Prior Anticoagulants: The patient has  ?                          taken no previous anticoagulant or antiplatelet  ?                          agents. ASA Grade Assessment: III - A patient with  ?                          severe systemic disease. After reviewing the risks  ?                          and benefits, the patient was deemed in  ?  satisfactory condition to undergo the procedure. ?                          After obtaining informed consent, the colonoscope  ?                          was passed under direct vision. Throughout the  ?                          procedure, the patient's blood pressure, pulse, and  ?                          oxygen saturations were monitored continuously. The  ?                          Olympus [id number] (#[id number]) Colonoscope was  ?                           introduced through the anus and advanced to the  ?                          cecum, identified by appendiceal orifice and  ?                          ileocecal valve. The colonoscopy was performed  ?                          without difficulty. The patient tolerated the  ?                          procedure well. The quality of the bowel  ?                          preparation was good. The ileocecal valve,  ?                          appendiceal orifice, and rectum were photographed. ?Scope In: 3:01:47 PM ?Scope Out: 3:16:53 PM ?Scope Withdrawal Time: 0 hours 10 minutes 56 seconds  ?Total Procedure Duration: 0 hours 15 minutes 6 seconds  ?Findings:                 The digital rectal exam was normal. ?                          A 5 mm polyp was found in the transverse colon. The  ?                          polyp was sessile. The polyp was removed with a  ?                          cold snare. Resection and retrieval were complete. ?                          A 4 mm polyp was found in the distal rectum.  The  ?                          polyp was sessile. The polyp was removed with a  ?                          cold snare. Resection and retrieval were complete. ?                          There was evidence of a prior end-to-side  ?                          colo-colonic anastomosis in the sigmoid colon. This  ?                          was patent and was characterized by healthy  ?                          appearing mucosa. The anastomosis was traversed. ?                          The retroflexed view of the distal rectum and anal  ?                          verge was normal and showed no anal or rectal  ?                          abnormalities. ?Complications:            No immediate complications. ?Estimated Blood Loss:     Estimated blood loss was minimal. ?Impression:               - One 5 mm polyp in the transverse colon, removed  ?                          with a cold snare. Resected and  retrieved. ?                          - One 4 mm polyp in the distal rectum, removed with  ?                          a cold snare. Resected and retrieved. ?                          - Patent end-to-side colo-colonic anastomosis,  ?                          characterized by healthy appearing mucosa. ?                          - The distal rectum and anal verge are normal on  ?                          retroflexion view. ?Recommendation:           -  Patient has a contact number available for  ?                          emergencies. The signs and symptoms of potential  ?                          delayed complications were discussed with the  ?                          patient. Return to normal activities tomorrow.  ?                          Written discharge instructions were provided to the  ?                          patient. ?                          - Resume previous diet. ?                          - Continue present medications. ?                          - Await pathology results. ?                          - Will discuss IDA with Dr. Alen Blew to determine if  ?                          capsule endoscopy will be recommended. ?                          - No recommendation at this time regarding repeat  ?                          colonoscopy due to age. ?Jerene Bears, MD ?01/12/2022 3:29:42 PM ?This report has been signed electronically. ?

## 2022-01-12 NOTE — Progress Notes (Signed)
Called to room to assist during endoscopic procedure.  Patient ID and intended procedure confirmed with present staff. Received instructions for my participation in the procedure from the performing physician.  

## 2022-01-12 NOTE — Patient Instructions (Signed)
Thank you for coming to see [place] today. ?Resume previous diet and medications today. ?Return to normal daily activities tomorrow. ?Do not recommend any more routine colonoscopies based on current age guidelines. ? ? ?YOU HAD AN ENDOSCOPIC PROCEDURE TODAY AT [facility]:   Refer to the procedure report that was given to you for any specific questions about what was found during the examination.  If the procedure report does not answer your questions, please call your gastroenterologist to clarify.  If you requested that your care partner not be given the details of your procedure findings, then the procedure report has been included in a sealed envelope for you to review at your convenience later. ? ?YOU SHOULD EXPECT: Some feelings of bloating in the abdomen. Passage of more gas than usual.  Walking can help get rid of the air that was put into your GI tract during the procedure and reduce the bloating. If you had a lower endoscopy (such as a colonoscopy or flexible sigmoidoscopy) you may notice spotting of blood in your stool or on the toilet paper. If you underwent a bowel prep for your procedure, you may not have a normal bowel movement for a few days. ? ?Please Note:  You might notice some irritation and congestion in your nose or some drainage.  This is from the oxygen used during your procedure.  There is no need for concern and it should clear up in a day or so. ? ?SYMPTOMS TO REPORT IMMEDIATELY: ? ?Following lower endoscopy (colonoscopy or flexible sigmoidoscopy): ? Excessive amounts of blood in the stool ? Significant tenderness or worsening of abdominal pains ? Swelling of the abdomen that is new, acute ? Fever of 100?F or higher ? ?Following upper endoscopy (EGD) ? Vomiting of blood or coffee ground material ? New chest pain or pain under the shoulder blades ? Painful or persistently difficult swallowing ? New shortness of breath ? Fever of 100?F or higher ? Black, tarry-looking  stools ? ?For urgent or emergent issues, a gastroenterologist can be reached at any hour by calling [phone]. ?Do not use MyChart messaging for urgent concerns.  ? ? ?DIET:  We do recommend a small meal at first, but then you may proceed to your regular diet.  Drink plenty of fluids but you should avoid alcoholic beverages for 24 hours. ? ?ACTIVITY:  You should plan to take it easy for the rest of today and you should NOT DRIVE or use heavy machinery until tomorrow (because of the sedation medicines used during the test).   ? ?FOLLOW UP: ?Our staff will call the number listed on your records 48-72 hours following your procedure to check on you and address any questions or concerns that you may have regarding the information given to you following your procedure. If we do not reach you, we will leave a message.  We will attempt to reach you two times.  During this call, we will ask if you have developed any symptoms of COVID 19. If you develop any symptoms (ie: fever, flu-like symptoms, shortness of breath, cough etc.) before then, please call [phone].  If you test positive for Covid 19 in the 2 weeks post procedure, please call and report this information to [place].   ? ?If any biopsies were taken you will be contacted by phone or by letter within the next 1-3 weeks.  Please call us at [phone] if you have not heard about the biopsies in 3 weeks.  ? ? ?  SIGNATURES/CONFIDENTIALITY: ?You and/or your care partner have signed paperwork which will be entered into your electronic medical record.  These signatures attest to the fact that that the information above on your After Visit Summary has been reviewed and is understood.  Full responsibility of the confidentiality of this discharge information lies with you and/or your care-partner.  ?

## 2022-01-12 NOTE — Op Note (Signed)
[name] ?Patient Name: [name] ?Procedure Date: 01/12/2022 2:48 PM ?MRN: [id number] ?Endoscopist: Jerene Bears , MD ?Age: 81 ?Referring MD:  ?Date of Birth: 03/10/1941 ?Gender: Male ?Account #: [id number] ?Procedure:                Upper GI endoscopy ?Indications:              Iron deficiency anemia, Anorexia, Weight loss ?Medicines:                Monitored Anesthesia Care ?Procedure:                Pre-Anesthesia Assessment: ?                          - Prior to the procedure, a History and Physical  ?                          was performed, and patient medications and  ?                          allergies were reviewed. The patient's tolerance of  ?                          previous anesthesia was also reviewed. The risks  ?                          and benefits of the procedure and the sedation  ?                          options and risks were discussed with the patient.  ?                          All questions were answered, and informed consent  ?                          was obtained. Prior Anticoagulants: The patient has  ?                          taken no previous anticoagulant or antiplatelet  ?                          agents. ASA Grade Assessment: III - A patient with  ?                          severe systemic disease. After reviewing the risks  ?                          and benefits, the patient was deemed in  ?                          satisfactory condition to undergo the procedure. ?                          After obtaining informed consent, the endoscope was  ?  passed under direct vision. Throughout the  ?                          procedure, the patient's blood pressure, pulse, and  ?                          oxygen saturations were monitored continuously. The  ?                          GIF HQ190 #[id number] was introduced through the  ?                          mouth, and advanced to the third part of duodenum.  ?                          The upper GI  endoscopy was accomplished without  ?                          difficulty. The patient tolerated the procedure  ?                          well. ?Scope In: ?Scope Out: ?Findings:                 The examined esophagus was normal. ?                          Mild inflammation characterized by congestion  ?                          (edema) and erythema was found in the gastric  ?                          antrum. Biopsies were taken with a cold forceps for  ?                          histology and Helicobacter pylori testing. ?                          The examined duodenum was normal. ?Complications:            No immediate complications. ?Estimated Blood Loss:     Estimated blood loss was minimal. ?Impression:               - Normal esophagus. ?                          - Gastritis. Biopsied. ?                          - Normal examined duodenum. ?Recommendation:           - Patient has a contact number available for  ?                          emergencies. The signs and symptoms of potential  ?  delayed complications were discussed with the  ?                          patient. Return to normal activities tomorrow.  ?                          Written discharge instructions were provided to the  ?                          patient. ?                          - Resume previous diet. ?                          - Continue present medications. ?                          - Await pathology results. ?                          - See the other procedure note for documentation of  ?                          additional recommendations. ?Jerene Bears, MD ?01/12/2022 3:21:08 PM ?This report has been signed electronically. ?

## 2022-01-12 NOTE — Progress Notes (Signed)
To Pacu, VSS. Report to Rn.tb 

## 2022-01-13 ENCOUNTER — Telehealth: Payer: Self-pay

## 2022-01-13 ENCOUNTER — Telehealth: Payer: Self-pay | Admitting: Internal Medicine

## 2022-01-13 NOTE — Telephone Encounter (Signed)
Inbound call from patient wife. Patient had procedure 4/17. Reports he is having pains right below rib cage. Patient took tums in the middle of the night for some relief. It helped a little but this morning is still experiencing the pain.  ?

## 2022-01-13 NOTE — Telephone Encounter (Signed)
Thanks for the update ?Agree with your recommendations ?I was on call for practice last night and I did not receive a text from answering service ?I will have Barb Merino, Environmental education officer, check with our answering service ?I apologize she was not able to directly reach me given post-procedure pain ?JMP ? ?

## 2022-01-13 NOTE — Telephone Encounter (Signed)
Pt's wife called back again. RN discussed use of GasX, warm liquids, heating pad and ambulation to help with discomfort. Pt reported that he had the pain again this morning and he took some Aleve and had some relief. RN discussed that he was likely having some residual gas discomfort or pressure may have needed to be applied to his abdomen during the procedure and that may have caused some soreness. Pt's wife verbalized understanding of instructions. Told to call back or seek care at the emergency department if the pain worsens.  ? ?Pt's wife c/o not being able to reach anyone this morning. States she called the number provided on the AVS before 8a and got a message that the office was closed and would not open until 8a. She called back at 8a and got the same message. She was able to get through and left a message. RN called back in about 15 minutes and left a voicemail. Wife states she had phone in her hand the entire time. She states she never had a call or notification of a voicemail and that maybe RN called the wrong number. RN stated that the number was dialed correctly using the number on the chart. Pt's wife just wanted someone to be aware that she was upset that she could not get in touch with anyone when her husband was having pain.  ? ? ?

## 2022-01-13 NOTE — Telephone Encounter (Signed)
Agree, please try to check on him later this morning ? ?

## 2022-01-13 NOTE — Telephone Encounter (Signed)
Notified Patient of prior authorization approval for Ondansetron 8 mg ODT. Medication is approved through 01/11/2023. No other concerns or needs verbalized at this time. ?

## 2022-01-13 NOTE — Telephone Encounter (Signed)
Attempted to return wife's call to discuss pt's condition. No answer. Left message that he may be having residual gas pains and to try GasX if the pt has not already tried. Stated in message that a staff member would try to call back again.  ?

## 2022-01-14 ENCOUNTER — Telehealth: Payer: Self-pay | Admitting: *Deleted

## 2022-01-14 NOTE — Telephone Encounter (Signed)
?  Follow up Call- ? ? ?  01/12/2022  ?  2:15 PM  ?Call back number  ?Post procedure Call Back phone  # [phone]  ?Permission to leave phone message Yes  ?  ? ?Patient questions: ?Phone kept ringing. ?

## 2022-01-14 NOTE — Telephone Encounter (Signed)
?  Follow up Call- ? ? ?  01/12/2022  ?  2:15 PM  ?Call back number  ?Post procedure Call Back phone  # [phone]  ?Permission to leave phone message Yes  ?  ? ?Patient questions: ?Person answered the phone and hung up. ?

## 2022-01-14 NOTE — Telephone Encounter (Addendum)
I verified that the number on the patient's AVS is the correct number to call [phone].  According to [employer] (our answering service) the system was working correctly, Georgette Shell, [employer] office supervisor communicated with them directly. They only had 4 calls come in for the night.  No calls came in on the [phone]  ?

## 2022-01-19 ENCOUNTER — Other Ambulatory Visit: Payer: Self-pay

## 2022-01-19 MED ORDER — BIS SUBCIT-METRONID-TETRACYC 140-125-125 MG PO CAPS: 3.0000 | ORAL_CAPSULE | Freq: Three times a day (TID) | ORAL | 0 refills | Status: DC

## 2022-01-19 MED ORDER — PANTOPRAZOLE SODIUM 40 MG PO TBEC: 40.0000 mg | DELAYED_RELEASE_TABLET | Freq: Two times a day (BID) | ORAL | 0 refills | Status: DC

## 2022-01-30 DIAGNOSIS — Z20822 Contact with and (suspected) exposure to covid-19: Secondary | ICD-10-CM | POA: Diagnosis not present

## 2022-02-02 ENCOUNTER — Other Ambulatory Visit: Payer: Self-pay | Admitting: Oncology

## 2022-02-02 DIAGNOSIS — C9110 Chronic lymphocytic leukemia of B-cell type not having achieved remission: Secondary | ICD-10-CM

## 2022-02-09 ENCOUNTER — Encounter (HOSPITAL_BASED_OUTPATIENT_CLINIC_OR_DEPARTMENT_OTHER): Payer: Self-pay | Admitting: Obstetrics and Gynecology

## 2022-02-09 ENCOUNTER — Inpatient Hospital Stay (HOSPITAL_COMMUNITY): Payer: Medicare Other

## 2022-02-09 ENCOUNTER — Emergency Department (HOSPITAL_BASED_OUTPATIENT_CLINIC_OR_DEPARTMENT_OTHER): Payer: Medicare Other

## 2022-02-09 ENCOUNTER — Inpatient Hospital Stay (HOSPITAL_BASED_OUTPATIENT_CLINIC_OR_DEPARTMENT_OTHER)
Admission: EM | Admit: 2022-02-09 | Discharge: 2022-02-11 | DRG: 808 | Disposition: A | Discharge status: Home / Self Care | Payer: Medicare Other | Attending: Internal Medicine | Admitting: Internal Medicine

## 2022-02-09 ENCOUNTER — Emergency Department (HOSPITAL_BASED_OUTPATIENT_CLINIC_OR_DEPARTMENT_OTHER): Payer: Medicare Other | Admitting: Radiology

## 2022-02-09 ENCOUNTER — Other Ambulatory Visit: Payer: Self-pay

## 2022-02-09 DIAGNOSIS — Z7982 Long term (current) use of aspirin: Secondary | ICD-10-CM | POA: Diagnosis not present

## 2022-02-09 DIAGNOSIS — R000 Tachycardia, unspecified: Secondary | ICD-10-CM | POA: Diagnosis not present

## 2022-02-09 DIAGNOSIS — D61818 Other pancytopenia: Secondary | ICD-10-CM | POA: Diagnosis present

## 2022-02-09 DIAGNOSIS — C9110 Chronic lymphocytic leukemia of B-cell type not having achieved remission: Secondary | ICD-10-CM | POA: Diagnosis present

## 2022-02-09 DIAGNOSIS — E1122 Type 2 diabetes mellitus with diabetic chronic kidney disease: Secondary | ICD-10-CM | POA: Diagnosis present

## 2022-02-09 DIAGNOSIS — C189 Malignant neoplasm of colon, unspecified: Secondary | ICD-10-CM | POA: Diagnosis present

## 2022-02-09 DIAGNOSIS — N189 Chronic kidney disease, unspecified: Secondary | ICD-10-CM | POA: Diagnosis not present

## 2022-02-09 DIAGNOSIS — R531 Weakness: Secondary | ICD-10-CM | POA: Diagnosis not present

## 2022-02-09 DIAGNOSIS — Z8041 Family history of malignant neoplasm of ovary: Secondary | ICD-10-CM | POA: Diagnosis not present

## 2022-02-09 DIAGNOSIS — Z681 Body mass index (BMI) 19 or less, adult: Secondary | ICD-10-CM

## 2022-02-09 DIAGNOSIS — Z87891 Personal history of nicotine dependence: Secondary | ICD-10-CM

## 2022-02-09 DIAGNOSIS — E7800 Pure hypercholesterolemia, unspecified: Secondary | ICD-10-CM | POA: Diagnosis not present

## 2022-02-09 DIAGNOSIS — Z7984 Long term (current) use of oral hypoglycemic drugs: Secondary | ICD-10-CM | POA: Diagnosis not present

## 2022-02-09 DIAGNOSIS — Z803 Family history of malignant neoplasm of breast: Secondary | ICD-10-CM | POA: Diagnosis not present

## 2022-02-09 DIAGNOSIS — R112 Nausea with vomiting, unspecified: Secondary | ICD-10-CM

## 2022-02-09 DIAGNOSIS — Z79899 Other long term (current) drug therapy: Secondary | ICD-10-CM | POA: Diagnosis not present

## 2022-02-09 DIAGNOSIS — S0990XA Unspecified injury of head, initial encounter: Secondary | ICD-10-CM | POA: Diagnosis not present

## 2022-02-09 DIAGNOSIS — I10 Essential (primary) hypertension: Secondary | ICD-10-CM | POA: Diagnosis not present

## 2022-02-09 DIAGNOSIS — R197 Diarrhea, unspecified: Secondary | ICD-10-CM | POA: Diagnosis present

## 2022-02-09 DIAGNOSIS — R6510 Systemic inflammatory response syndrome (SIRS) of non-infectious origin without acute organ dysfunction: Secondary | ICD-10-CM

## 2022-02-09 DIAGNOSIS — E1165 Type 2 diabetes mellitus with hyperglycemia: Secondary | ICD-10-CM | POA: Diagnosis present

## 2022-02-09 DIAGNOSIS — Z885 Allergy status to narcotic agent status: Secondary | ICD-10-CM

## 2022-02-09 DIAGNOSIS — Z66 Do not resuscitate: Secondary | ICD-10-CM | POA: Diagnosis present

## 2022-02-09 DIAGNOSIS — R5381 Other malaise: Secondary | ICD-10-CM | POA: Diagnosis not present

## 2022-02-09 DIAGNOSIS — W1830XA Fall on same level, unspecified, initial encounter: Secondary | ICD-10-CM | POA: Diagnosis present

## 2022-02-09 DIAGNOSIS — Z886 Allergy status to analgesic agent status: Secondary | ICD-10-CM

## 2022-02-09 DIAGNOSIS — Z85048 Personal history of other malignant neoplasm of rectum, rectosigmoid junction, and anus: Secondary | ICD-10-CM | POA: Diagnosis not present

## 2022-02-09 DIAGNOSIS — Z9221 Personal history of antineoplastic chemotherapy: Secondary | ICD-10-CM | POA: Diagnosis not present

## 2022-02-09 DIAGNOSIS — Z7189 Other specified counseling: Secondary | ICD-10-CM

## 2022-02-09 DIAGNOSIS — I129 Hypertensive chronic kidney disease with stage 1 through stage 4 chronic kidney disease, or unspecified chronic kidney disease: Secondary | ICD-10-CM | POA: Diagnosis present

## 2022-02-09 DIAGNOSIS — D709 Neutropenia, unspecified: Secondary | ICD-10-CM

## 2022-02-09 DIAGNOSIS — A419 Sepsis, unspecified organism: Secondary | ICD-10-CM | POA: Insufficient documentation

## 2022-02-09 DIAGNOSIS — C9112 Chronic lymphocytic leukemia of B-cell type in relapse: Secondary | ICD-10-CM | POA: Diagnosis not present

## 2022-02-09 DIAGNOSIS — N281 Cyst of kidney, acquired: Secondary | ICD-10-CM | POA: Diagnosis not present

## 2022-02-09 DIAGNOSIS — E43 Unspecified severe protein-calorie malnutrition: Secondary | ICD-10-CM | POA: Diagnosis present

## 2022-02-09 DIAGNOSIS — E119 Type 2 diabetes mellitus without complications: Secondary | ICD-10-CM

## 2022-02-09 DIAGNOSIS — N179 Acute kidney failure, unspecified: Secondary | ICD-10-CM | POA: Diagnosis present

## 2022-02-09 DIAGNOSIS — E860 Dehydration: Secondary | ICD-10-CM | POA: Diagnosis not present

## 2022-02-09 DIAGNOSIS — J90 Pleural effusion, not elsewhere classified: Secondary | ICD-10-CM | POA: Diagnosis not present

## 2022-02-09 LAB — URINALYSIS, ROUTINE W REFLEX MICROSCOPIC
Bilirubin Urine: NEGATIVE
Glucose, UA: 100 mg/dL — AB
Hgb urine dipstick: NEGATIVE
Ketones, ur: NEGATIVE mg/dL
Leukocytes,Ua: NEGATIVE
Nitrite: NEGATIVE
Protein, ur: 100 mg/dL — AB
Specific Gravity, Urine: 1.014 (ref 1.005–1.030)
pH: 6 (ref 5.0–8.0)

## 2022-02-09 LAB — COMPREHENSIVE METABOLIC PANEL
ALT: 11 U/L (ref 0–44)
AST: 16 U/L (ref 15–41)
Albumin: 4.1 g/dL (ref 3.5–5.0)
Alkaline Phosphatase: 105 U/L (ref 38–126)
Anion gap: 13 (ref 5–15)
BUN: 42 mg/dL — ABNORMAL HIGH (ref 8–23)
CO2: 25 mmol/L (ref 22–32)
Calcium: 10.7 mg/dL — ABNORMAL HIGH (ref 8.9–10.3)
Chloride: 102 mmol/L (ref 98–111)
Creatinine, Ser: 2.63 mg/dL — ABNORMAL HIGH (ref 0.61–1.24)
GFR, Estimated: 24 mL/min — ABNORMAL LOW (ref >60)
Glucose, Bld: 286 mg/dL — ABNORMAL HIGH (ref 70–99)
Potassium: 4.7 mmol/L (ref 3.5–5.1)
Sodium: 140 mmol/L (ref 135–145)
Total Bilirubin: 1 mg/dL (ref 0.3–1.2)
Total Protein: 6.9 g/dL (ref 6.5–8.1)

## 2022-02-09 LAB — CBC WITH DIFFERENTIAL/PLATELET
Abs Immature Granulocytes: 0.01 10*3/uL (ref 0.00–0.07)
Basophils Absolute: 0 10*3/uL (ref 0.0–0.1)
Basophils Relative: 0 %
Eosinophils Absolute: 0 10*3/uL (ref 0.0–0.5)
Eosinophils Relative: 1 %
HCT: 32.9 % — ABNORMAL LOW (ref 39.0–52.0)
Hemoglobin: 10.4 g/dL — ABNORMAL LOW (ref 13.0–17.0)
Immature Granulocytes: 0 %
Lymphocytes Relative: 64 %
Lymphs Abs: 1.8 10*3/uL (ref 0.7–4.0)
MCH: 26 pg (ref 26.0–34.0)
MCHC: 31.6 g/dL (ref 30.0–36.0)
MCV: 82.3 fL (ref 80.0–100.0)
Monocytes Absolute: 0.9 10*3/uL (ref 0.1–1.0)
Monocytes Relative: 32 %
Neutro Abs: 0.1 10*3/uL — CL (ref 1.7–7.7)
Neutrophils Relative %: 3 %
Platelets: 86 10*3/uL — ABNORMAL LOW (ref 150–400)
RBC: 4 MIL/uL — ABNORMAL LOW (ref 4.22–5.81)
RDW: 17 % — ABNORMAL HIGH (ref 11.5–15.5)
WBC: 2.8 10*3/uL — ABNORMAL LOW (ref 4.0–10.5)
nRBC: 0 % (ref 0.0–0.2)

## 2022-02-09 LAB — LACTIC ACID, PLASMA: Lactic Acid, Venous: 1.3 mmol/L (ref 0.5–1.9)

## 2022-02-09 LAB — HEMOGLOBIN A1C
Hgb A1c MFr Bld: 6.9 % — ABNORMAL HIGH (ref 4.8–5.6)
Mean Plasma Glucose: 151.33 mg/dL

## 2022-02-09 LAB — LIPASE, BLOOD: Lipase: 32 U/L (ref 11–51)

## 2022-02-09 LAB — PROTIME-INR
INR: 1 (ref 0.8–1.2)
Prothrombin Time: 12.7 seconds (ref 11.4–15.2)

## 2022-02-09 LAB — GLUCOSE, CAPILLARY
Glucose-Capillary: 336 mg/dL — ABNORMAL HIGH (ref 70–99)
Glucose-Capillary: 128 mg/dL — ABNORMAL HIGH (ref 70–99)

## 2022-02-09 LAB — MRSA NEXT GEN BY PCR, NASAL: MRSA by PCR Next Gen: NOT DETECTED

## 2022-02-09 MED ORDER — INSULIN ASPART 100 UNIT/ML IJ SOLN
0.0000 [IU] | Freq: Every day | INTRAMUSCULAR | Status: DC
  Administered 2022-02-10: 2 [IU] via SUBCUTANEOUS

## 2022-02-09 MED ORDER — SODIUM CHLORIDE 0.9 % IV SOLN: Freq: Once | INTRAVENOUS | Status: AC

## 2022-02-09 MED ORDER — SODIUM CHLORIDE 0.9 % IV BOLUS
1000.0000 mL | Freq: Once | INTRAVENOUS | Status: AC
Start: 2022-02-09 — End: 2022-02-09
  Administered 2022-02-09: 1000 mL via INTRAVENOUS

## 2022-02-09 MED ORDER — LACTATED RINGERS IV SOLN: INTRAVENOUS | Status: DC

## 2022-02-09 MED ORDER — SODIUM CHLORIDE 0.9 % IV SOLN
2.0000 g | Freq: Once | INTRAVENOUS | Status: AC
  Administered 2022-02-09: 2 g via INTRAVENOUS
  Filled 2022-02-09: qty 12.5

## 2022-02-09 MED ORDER — VANCOMYCIN HCL IN DEXTROSE 1-5 GM/200ML-% IV SOLN
1000.0000 mg | Freq: Once | INTRAVENOUS | Status: AC
  Administered 2022-02-09: 1000 mg via INTRAVENOUS
  Filled 2022-02-09: qty 200

## 2022-02-09 MED ORDER — VANCOMYCIN HCL 750 MG/150ML IV SOLN
750.0000 mg | INTRAVENOUS | Status: DC
  Filled 2022-02-09: qty 150

## 2022-02-09 MED ORDER — ACETAMINOPHEN 650 MG RE SUPP: 650.0000 mg | Freq: Four times a day (QID) | RECTAL | Status: DC | PRN

## 2022-02-09 MED ORDER — ZOLPIDEM TARTRATE 5 MG PO TABS
5.0000 mg | ORAL_TABLET | Freq: Once | ORAL | Status: AC
  Administered 2022-02-09: 5 mg via ORAL
  Filled 2022-02-09: qty 1

## 2022-02-09 MED ORDER — SODIUM CHLORIDE 0.9 % IV SOLN: 2.0000 g | INTRAVENOUS | Status: DC

## 2022-02-09 MED ORDER — PROCHLORPERAZINE EDISYLATE 10 MG/2ML IJ SOLN: 10.0000 mg | Freq: Four times a day (QID) | INTRAMUSCULAR | Status: DC | PRN

## 2022-02-09 MED ORDER — CHLORHEXIDINE GLUCONATE CLOTH 2 % EX PADS
6.0000 | MEDICATED_PAD | Freq: Every day | CUTANEOUS | Status: DC
  Administered 2022-02-09 – 2022-02-11 (×3): 6 via TOPICAL

## 2022-02-09 MED ORDER — ACETAMINOPHEN 325 MG PO TABS
650.0000 mg | ORAL_TABLET | Freq: Four times a day (QID) | ORAL | Status: DC | PRN
Start: 2022-02-09 — End: 2022-02-11

## 2022-02-09 MED ORDER — HEPARIN SODIUM (PORCINE) 5000 UNIT/ML IJ SOLN
5000.0000 [IU] | Freq: Three times a day (TID) | INTRAMUSCULAR | Status: DC
  Administered 2022-02-09 – 2022-02-10 (×3): 5000 [IU] via SUBCUTANEOUS
  Filled 2022-02-09 (×3): qty 1

## 2022-02-09 MED ORDER — AMLODIPINE BESYLATE 5 MG PO TABS
2.5000 mg | ORAL_TABLET | Freq: Once | ORAL | Status: AC
  Administered 2022-02-09: 2.5 mg via ORAL
  Filled 2022-02-09: qty 1

## 2022-02-09 MED ORDER — INSULIN ASPART 100 UNIT/ML IJ SOLN
0.0000 [IU] | Freq: Three times a day (TID) | INTRAMUSCULAR | Status: DC
  Administered 2022-02-09: 7 [IU] via SUBCUTANEOUS
  Administered 2022-02-10: 1 [IU] via SUBCUTANEOUS
  Administered 2022-02-10: 3 [IU] via SUBCUTANEOUS
  Administered 2022-02-10: 2 [IU] via SUBCUTANEOUS
  Administered 2022-02-11: 1 [IU] via SUBCUTANEOUS

## 2022-02-09 NOTE — Progress Notes (Signed)
Pharmacy Antibiotic Note ? ?[name] is a 81 y.o. male admitted on 02/09/2022 with  neutropenia .  Pharmacy has been consulted for vancomycin and cefepime dosing. Pt is afebrile and WBC is low at 2.8. SCr is elevated at 2.63.  ? ?Plan: ?Vancomycin 1g IV x 1 then '750mg'$  IV Q48H ?Cefepime 2gm IV Q24H ?F/u renal fxn, C&S, clinical status and peak/trough at [facility] ? ?Height: '5\' 7"'$  (170.2 cm) ?Weight: 55 kg (121 lb 4.1 oz) ?IBW/kg (Calculated) : 66.1 ? ?Temp (24hrs), Avg:98.8 ?F (37.1 ?C), Min:98.8 ?F (37.1 ?C), Max:98.8 ?F (37.1 ?C) ? ?Recent Labs  ?Lab 02/09/22 ?1023  ?WBC 2.8*  ?CREATININE 2.63*  ?LATICACIDVEN 1.3  ?  ?Estimated Creatinine Clearance: 17.7 mL/min (A) (by C-G formula based on SCr of 2.63 mg/dL (H)).   ? ?Allergies  ?Allergen Reactions  ? Hydrocodone-Acetaminophen Other (See Comments)  ?  Loss of weight  ? ? ?Thank you for allowing pharmacy to be a part of this patient?s care. ? ?Delmus Warwick, Rande Lawman ?02/09/2022 12:15 PM ? ?

## 2022-02-09 NOTE — ED Triage Notes (Signed)
Patient reports to the ER for weakness. Patient has CLL and is a patient of Dr. Barbaraann Faster. Patient had a medication change in March. Patient's wife reports the weakness has been progressive since march but today he cannot walk down stairs which he could do yesterday. Patient had a fall yesterday in the bathroom. Denies hitting his head when he fell in the bathroom.  ?

## 2022-02-09 NOTE — H&P (Signed)
?History and Physical  ? ? ?Patient: [name] DJM:[id number] DOB: 1940-12-01 ?DOA: 02/09/2022 ?DOS: the patient was seen and examined on 02/09/2022 ?PCP: [name], MD  ?Patient coming from: Home ? ?Chief Complaint:  ?Chief Complaint  ?Patient presents with  ? Weakness  ? ?HPI: [name] is a 81 y.o. male with medical history significant of CLL, HTN, HLD, rectal cancer, CLL, DM2. Presenting with generalized weakness. He reports that he was taken off a leukemia medication 2 months ago. Since that time he has had N/V and weakness. His typical pattern is to be able to tolerate breakfast; but by the time he gets to lunch and dinner, he has bouts on N/V. He denies any fevers or chills. He has intermittent diarrhea. He has no sick contacts. He has felt progressively weaker over this time until the point that this morning he was off balance and felt he was going to fall. His family became concerned and brought him to the ED for evaluation.  ? ?Review of Systems: As mentioned in the history of present illness. All other systems reviewed and are negative. ?Past Medical History:  ?Diagnosis Date  ? CLL (chronic lymphocytic leukemia) ([place])   ? chemotherapy  ? Colon cancer ([place])   ? Colorectal cancer ([place])   ? COLORECTAL DX 2/09, treated surgically  ? Diabetes mellitus   ? Hypercholesterolemia   ? Hypertension   ? Pneumonia   ? Bilateral pneumonia  ? ?Past Surgical History:  ?Procedure Laterality Date  ? HEMICOLECTOMY    ? nasal polyps    ? S/P nasal surgery  ? ?Social History:  reports that he has quit smoking. His smoking use included cigarettes. He has a 28.00 pack-year smoking history. He has never used smokeless tobacco. He reports that he does not drink alcohol and does not use drugs. ? ?Allergies  ?Allergen Reactions  ? Hydrocodone-Acetaminophen Other (See Comments)  ?  Loss of weight  ? ? ?Family History  ?Problem Relation Age of Onset  ? Ovarian cancer Mother   ? Breast cancer Maternal Aunt   ? ? ?Prior to  Admission medications   ?Medication Sig Start Date End Date Taking? Authorizing Provider  ?metFORMIN (GLUCOPHAGE) 1000 MG tablet Take 1,000 mg by mouth 2 (two) times daily with a meal. 06/05/17  Yes [provider]  ?amLODipine (NORVASC) 5 MG tablet Take 5 mg by mouth every morning. 11/12/19   [provider]  ?aspirin 81 MG tablet Take 81 mg by mouth every morning.     [provider]  ?atorvastatin (LIPITOR) 40 MG tablet Take 40 mg by mouth every evening.  01/10/13   [provider]  ?bismuth-metronidazole-tetracycline (PYLERA) [phone] MG capsule Take 3 capsules by mouth 4 (four) times daily -  before meals and at bedtime. 01/19/22   Pyrtle, Lajuan Lines, MD  ?famotidine (PEPCID) 40 MG tablet Take 40 mg by mouth at bedtime. 11/13/21   [provider]  ?guaiFENesin-dextromethorphan (ROBITUSSIN DM) 100-10 MG/5ML syrup Take 5 mLs by mouth every 6 (six) hours as needed for cough. 10/25/20   Arrien, Jimmy Picket, MD  ?hydroxypropyl methylcellulose / hypromellose (ISOPTO TEARS / GONIOVISC) 2.5 % ophthalmic solution Place 1 drop into both eyes in the morning and at bedtime.    [provider]  ?ibrutinib (IMBRUVICA) 420 MG tablet TAKE 1 TABLET BY MOUTH DAILY 11/14/21 11/14/22  Wyatt Portela, MD  ?lisinopril (ZESTRIL) 20 MG tablet Take 20 mg by mouth every morning. 12/14/18   [provider]  ?  metoprolol tartrate (LOPRESSOR) 25 MG tablet Take 0.5 tablets (12.5 mg total) by mouth 2 (two) times daily. 03/27/19 06/25/19  Amin, Jeanella Flattery, MD  ?ondansetron (ZOFRAN-ODT) 8 MG disintegrating tablet TAKE 1 TABLET BY MOUTH EVERY 8 HOURS AS NEEDED FOR NAUSEA AND VOMITING 02/02/22   Wyatt Portela, MD  ?pantoprazole (PROTONIX) 40 MG tablet Take 1 tablet (40 mg total) by mouth daily. ?Patient not taking: Reported on 01/12/2022 03/27/19 06/25/19  Damita Lack, MD  ?pantoprazole (PROTONIX) 40 MG tablet Take 1 tablet (40 mg total) by mouth 2 (two) times daily. 01/19/22   Pyrtle,  Lajuan Lines, MD  ?prochlorperazine (COMPAZINE) 10 MG tablet TAKE 1 TABLET BY MOUTH EVERY 6 HOURS AS NEEDED FOR NAUSEA OR VOMITING. 01/01/22   Wyatt Portela, MD  ?zolpidem (AMBIEN) 10 MG tablet Take 5 mg by mouth at bedtime as needed for sleep. 10/31/19   [provider]  ? ? ?Physical Exam: ?Vitals:  ? 02/09/22 1230 02/09/22 1259 02/09/22 1300 02/09/22 1420  ?BP: (!) 159/80  (!) 154/84 140/64  ?Pulse: (!) 122  (!) 123 (!) 120  ?Resp: 18  (!) 23 20  ?Temp:  98.3 ?F (36.8 ?C)  98.9 ?F (37.2 ?C)  ?TempSrc:  Oral  Oral  ?SpO2: 98%  99% 94%  ?Weight:    52.8 kg  ?Height:    '5\' 7"'$  (1.702 m)  ? ?General: 81 y.o. chronically ill appearing male resting in bed in NAD ?Eyes: PERRL, normal sclera ?ENMT: Nares patent w/o discharge, orophaynx clear, dentition normal, ears w/o discharge/lesions/ulcers, cachetic  ?Neck: thin, trachea midline ?Cardiovascular: tachy, +S1, S2, no m/g/r, equal pulses throughout ?Respiratory: CTABL, no w/r/r, normal WOB ?GI: BS+, NDNT, no masses noted, no organomegaly noted ?MSK: No e/c/c ?Neuro: A&O x 3, no focal deficits ?Psyc: Appropriate interaction and affect, calm/cooperative ? ?Data Reviewed: ? ?Na+  140 ?K+  4.7 ?Glucose  286 ?BUN  42 ?Scr  2.63 ?Ca2+  10.7 ? ?CXR: ?1. Small bilateral pleural effusions. ?2. No sign of consolidation. ? ?KUB: Negative. ? ?CTH: No acute intracranial abnormality seen. ? ?Assessment and Plan: ?SIRS ?    - admitted to inpt, SDU ?    - Bld Cx pending, UA negative, CXR negative ?    - right now, being covered broadly for infection; will continue abx through today ?    - he's dehydrated for sure; will continue fluids ?    - spoke with onco about neutropenia; no GCSF at this point; appreciate their assistance ? ?AKI ?Dehydration ?    - fluids, renal US, watch nephrotoxins ? ?Hypercalcemia ?    - secondary to CLL ?    - fluids, follow ? ?DM2 uncontrolled ?    - A1c, SSI, glucose checks, DM diet ? ?Pancytopenia w/ neutropenia ?CLL ?Colon CA ?    - spoke with onco about  neutropenia; no GCSF at this point; appreciate their assistance ?    - onco will follow ? ?HTN ?    - resume home regimen when confirmed ? ?HLD ?    - resume home regimen when confirmed ? ?Severe protein calorie malnutrition ?    - resume home regimen when confirmed ? ?Advance Care Planning:   Code Status: DNR ? ?Consults: Onco ? ?Family Communication: None at bedside ? ?Severity of Illness: ?The appropriate patient status for this patient is INPATIENT. Inpatient status is judged to be reasonable and necessary in order to provide the required intensity of service to ensure the patient's  safety. The patient's presenting symptoms, physical exam findings, and initial radiographic and laboratory data in the context of their chronic comorbidities is felt to place them at high risk for further clinical deterioration. Furthermore, it is not anticipated that the patient will be medically stable for discharge from the hospital within 2 midnights of admission.  ? ?* I certify that at the point of admission it is my clinical judgment that the patient will require inpatient hospital care spanning beyond 2 midnights from the point of admission due to high intensity of service, high risk for further deterioration and high frequency of surveillance required.* ? ?Author: ?Jonnie Finner, DO ?02/09/2022 2:57 PM ? ?For on call review [email or web address].  ?

## 2022-02-09 NOTE — Progress Notes (Signed)
Plan of Care Note for accepted transfer ? ? ?Patient: [name] MRN: [id number]   DOA: 02/09/2022 ? ?Facility requesting transfer: [facility] ?Requesting Provider: Dr. Autumn Messing ?Reason for transfer: SIRS/sepsis of unknown source ?Facility course: 81 yo M w/ history of colorectal cancer and CLL. Presenting with generalized weakness, poor PO intake, N/V. Has neutropenia w/ normal lactic. Normal CXR and UA. He was started on fluids and broad spec abx. Coming for SIRS/Sepsis of unknown source w/ neutropenia.  ? ?Plan of care: ?The patient is accepted for admission to [facility] unit, at [facility]..  ?While holding at [facility], medical decision making remains the responsibility of the EDP. Upon arrival to [facility], [facility] was assume care.  ? ?Author: ?Jonnie Finner, DO ?02/09/2022 ? ?Check [email or web address] for on-call coverage. ? ?Nursing staff, Please call [place] number on Amion as soon as patient's arrival, so appropriate admitting provider can evaluate the pt. ? ?

## 2022-02-09 NOTE — ED Provider Notes (Signed)
?[facility] EMERGENCY DEPT ?Provider Note ? ? ?CSN: [id number] ?Arrival date & time: 02/09/22  5956 ? ?  ? ?History ? ?Chief Complaint  ?Patient presents with  ? Weakness  ? ? ?[name] is a 81 y.o. male. ? ?Patient here with generalized weakness.  Poor p.o. intake, 2 episodes of emesis yesterday.  History of colorectal cancer, diabetes, CLL.  He has been on some maintenance treatments.  Denies any abdominal pain, cough or sputum production.  Denies any fever.  Had a mechanical fall yesterday due to severe weakness.  Has been too weak to do daily activities here today and yesterday.  Nothing has made it worse or better.  Wife states that he is not been doing well since stopping the medication that he took for his cancer several months ago.  Denies any pain with urination. ? ?The history is provided by the patient and the spouse.  ? ?  ? ?Home Medications ?Prior to Admission medications   ?Medication Sig Start Date End Date Taking? Authorizing Provider  ?amLODipine (NORVASC) 5 MG tablet Take 5 mg by mouth every morning. 11/12/19   [provider]  ?aspirin 81 MG tablet Take 81 mg by mouth every morning.     [provider]  ?atorvastatin (LIPITOR) 40 MG tablet Take 40 mg by mouth every evening.  01/10/13   [provider]  ?bismuth-metronidazole-tetracycline (PYLERA) [phone] MG capsule Take 3 capsules by mouth 4 (four) times daily -  before meals and at bedtime. 01/19/22   Pyrtle, Lajuan Lines, MD  ?famotidine (PEPCID) 40 MG tablet Take 40 mg by mouth at bedtime. 11/13/21   [provider]  ?guaiFENesin-dextromethorphan (ROBITUSSIN DM) 100-10 MG/5ML syrup Take 5 mLs by mouth every 6 (six) hours as needed for cough. 10/25/20   Arrien, Jimmy Picket, MD  ?hydroxypropyl methylcellulose / hypromellose (ISOPTO TEARS / GONIOVISC) 2.5 % ophthalmic solution Place 1 drop into both eyes in the morning and at bedtime.    [provider]  ?ibrutinib (IMBRUVICA) 420 MG  tablet TAKE 1 TABLET BY MOUTH DAILY 11/14/21 11/14/22  Wyatt Portela, MD  ?lisinopril (ZESTRIL) 20 MG tablet Take 20 mg by mouth every morning. 12/14/18   [provider]  ?metFORMIN (GLUCOPHAGE) 1000 MG tablet Take 1,000 mg by mouth 2 (two) times daily with a meal. 06/05/17   [provider]  ?metoprolol tartrate (LOPRESSOR) 25 MG tablet Take 0.5 tablets (12.5 mg total) by mouth 2 (two) times daily. 03/27/19 06/25/19  Amin, Jeanella Flattery, MD  ?ondansetron (ZOFRAN-ODT) 8 MG disintegrating tablet TAKE 1 TABLET BY MOUTH EVERY 8 HOURS AS NEEDED FOR NAUSEA AND VOMITING 02/02/22   Wyatt Portela, MD  ?pantoprazole (PROTONIX) 40 MG tablet Take 1 tablet (40 mg total) by mouth daily. ?Patient not taking: Reported on 01/12/2022 03/27/19 06/25/19  Damita Lack, MD  ?pantoprazole (PROTONIX) 40 MG tablet Take 1 tablet (40 mg total) by mouth 2 (two) times daily. 01/19/22   Pyrtle, Lajuan Lines, MD  ?prochlorperazine (COMPAZINE) 10 MG tablet TAKE 1 TABLET BY MOUTH EVERY 6 HOURS AS NEEDED FOR NAUSEA OR VOMITING. 01/01/22   Wyatt Portela, MD  ?[facility] injection  08/17/19   [provider]  ?zolpidem (AMBIEN) 10 MG tablet Take 5 mg by mouth at bedtime as needed for sleep. 10/31/19   [provider]  ?   ? ?Allergies    ?Hydrocodone-acetaminophen   ? ?Review of Systems   ?Review of Systems ? ?Physical Exam ?Updated Vital Signs ?BP Marland Kitchen)  151/93   Pulse (!) 127   Temp 98.8 ?F (37.1 ?C)   Resp (!) 24   Ht '5\' 7"'$  (1.702 m)   Wt 55 kg   SpO2 96%   BMI 18.99 kg/m?  ?Physical Exam ?Vitals and nursing note reviewed.  ?Constitutional:   ?   General: He is not in acute distress. ?   Appearance: He is well-developed. He is ill-appearing.  ?HENT:  ?   Head: Normocephalic and atraumatic.  ?   Mouth/Throat:  ?   Mouth: Mucous membranes are dry.  ?Eyes:  ?   Extraocular Movements: Extraocular movements intact.  ?   Conjunctiva/sclera: Conjunctivae normal.  ?   Pupils: Pupils are equal, round, and reactive to light.   ?Cardiovascular:  ?   Rate and Rhythm: Normal rate and regular rhythm.  ?   Pulses: Normal pulses.  ?   Heart sounds: Normal heart sounds. No murmur heard. ?Pulmonary:  ?   Effort: Pulmonary effort is normal. No respiratory distress.  ?   Breath sounds: Normal breath sounds.  ?Abdominal:  ?   Palpations: Abdomen is soft.  ?   Tenderness: There is no abdominal tenderness.  ?Musculoskeletal:     ?   General: No swelling. Normal range of motion.  ?   Cervical back: Neck supple.  ?Skin: ?   General: Skin is warm and dry.  ?   Capillary Refill: Capillary refill takes less than 2 seconds.  ?Neurological:  ?   General: No focal deficit present.  ?   Mental Status: He is alert and oriented to person, place, and time.  ?   Cranial Nerves: No cranial nerve deficit.  ?   Sensory: No sensory deficit.  ?   Motor: No weakness.  ?Psychiatric:     ?   Mood and Affect: Mood normal.  ? ? ?ED Results / Procedures / Treatments   ?Labs ?(all labs ordered are listed, but only abnormal results are displayed) ?Labs Reviewed  ?COMPREHENSIVE METABOLIC PANEL - Abnormal; Notable for the following components:  ?    Result Value  ? Glucose, Bld 286 (*)   ? BUN 42 (*)   ? Creatinine, Ser 2.63 (*)   ? Calcium 10.7 (*)   ? GFR, Estimated 24 (*)   ? All other components within normal limits  ?CBC WITH DIFFERENTIAL/PLATELET - Abnormal; Notable for the following components:  ? WBC 2.8 (*)   ? RBC 4.00 (*)   ? Hemoglobin 10.4 (*)   ? HCT 32.9 (*)   ? RDW 17.0 (*)   ? Platelets 86 (*)   ? Neutro Abs 0.1 (*)   ? All other components within normal limits  ?URINALYSIS, ROUTINE W REFLEX MICROSCOPIC - Abnormal; Notable for the following components:  ? APPearance HAZY (*)   ? Glucose, UA 100 (*)   ? Protein, ur 100 (*)   ? Bacteria, UA RARE (*)   ? All other components within normal limits  ?CULTURE, BLOOD (ROUTINE X 2)  ?CULTURE, BLOOD (ROUTINE X 2)  ?URINE CULTURE  ?LACTIC ACID, PLASMA  ?PROTIME-INR  ?LIPASE, BLOOD  ? ? ?EKG ?EKG  Interpretation ? ?Date/Time:  Monday Feb 09 2022 09:46:06 EDT ?Ventricular Rate:  150 ?PR Interval:  138 ?QRS Duration: 66 ?QT Interval:  318 ?QTC Calculation: 502 ?R Axis:   90 ?Text Interpretation: Sinus tachycardia Rightward axis Borderline ECG When compared with ECG of 23-Oct-2020 01:06, PREVIOUS ECG IS PRESENT Confirmed by Ronnald Nian, Jaisha Villacres ([id number]) on 02/09/2022 9:50:20  AM ? ?Radiology ?DG Chest 2 View ? ?Result Date: 02/09/2022 ?CLINICAL DATA:  81 year old male presenting with suspected sepsis and weakness. EXAM: CHEST - 2 VIEW COMPARISON:  October 22, 2020. FINDINGS: Cardiomediastinal contours and hilar structures are normal. Lungs are clear. Small bilateral pleural effusions. On limited assessment there is no acute skeletal process. IMPRESSION: 1. Small bilateral pleural effusions. 2. No sign of consolidation. Electronically Signed   By: Zetta Bills M.D.   On: 02/09/2022 10:02  ? ?CT Head Wo Contrast ? ?Result Date: 02/09/2022 ?CLINICAL DATA:  Weakness, head injury after fall. EXAM: CT HEAD WITHOUT CONTRAST TECHNIQUE: Contiguous axial images were obtained from the base of the skull through the vertex without intravenous contrast. RADIATION DOSE REDUCTION: This exam was performed according to the departmental dose-optimization program which includes automated exposure control, adjustment of the mA and/or kV according to patient size and/or use of iterative reconstruction technique. COMPARISON:  October 22, 2020. FINDINGS: Brain: Mild chronic ischemic white matter disease is noted. No mass effect or midline shift is noted. Ventricular size is within normal limits. There is no evidence of mass lesion, hemorrhage or acute infarction. Vascular: No hyperdense vessel or unexpected calcification. Skull: Normal. Negative for fracture or focal lesion. Sinuses/Orbits: No acute finding. Other: None. IMPRESSION: No acute intracranial abnormality seen. Electronically Signed   By: Marijo Conception M.D.   On: 02/09/2022 11:05    ? ?Procedures ?Marland KitchenCritical Care ?Performed by: Lennice Sites, DO ?Authorized by: Lennice Sites, DO  ? ?Critical care provider statement:  ?  Critical care time (minutes):  35 ?  Critical care was necessary to treat or prevent imminent or life

## 2022-02-09 NOTE — ED Notes (Signed)
Patient transported to X-ray 

## 2022-02-10 DIAGNOSIS — N179 Acute kidney failure, unspecified: Secondary | ICD-10-CM

## 2022-02-10 DIAGNOSIS — C9110 Chronic lymphocytic leukemia of B-cell type not having achieved remission: Secondary | ICD-10-CM

## 2022-02-10 DIAGNOSIS — C9112 Chronic lymphocytic leukemia of B-cell type in relapse: Secondary | ICD-10-CM | POA: Diagnosis not present

## 2022-02-10 DIAGNOSIS — E1165 Type 2 diabetes mellitus with hyperglycemia: Secondary | ICD-10-CM

## 2022-02-10 DIAGNOSIS — C189 Malignant neoplasm of colon, unspecified: Secondary | ICD-10-CM

## 2022-02-10 DIAGNOSIS — E43 Unspecified severe protein-calorie malnutrition: Secondary | ICD-10-CM

## 2022-02-10 DIAGNOSIS — R112 Nausea with vomiting, unspecified: Secondary | ICD-10-CM

## 2022-02-10 DIAGNOSIS — I10 Essential (primary) hypertension: Secondary | ICD-10-CM

## 2022-02-10 DIAGNOSIS — Z7189 Other specified counseling: Secondary | ICD-10-CM

## 2022-02-10 DIAGNOSIS — R197 Diarrhea, unspecified: Secondary | ICD-10-CM

## 2022-02-10 DIAGNOSIS — N1832 Chronic kidney disease, stage 3b: Secondary | ICD-10-CM

## 2022-02-10 DIAGNOSIS — R5381 Other malaise: Secondary | ICD-10-CM

## 2022-02-10 DIAGNOSIS — E860 Dehydration: Secondary | ICD-10-CM | POA: Diagnosis not present

## 2022-02-10 DIAGNOSIS — D709 Neutropenia, unspecified: Secondary | ICD-10-CM | POA: Diagnosis not present

## 2022-02-10 DIAGNOSIS — D61818 Other pancytopenia: Secondary | ICD-10-CM | POA: Diagnosis not present

## 2022-02-10 LAB — DIFFERENTIAL
Abs Immature Granulocytes: 0 10*3/uL (ref 0.00–0.07)
Basophils Absolute: 0 10*3/uL (ref 0.0–0.1)
Basophils Relative: 1 %
Eosinophils Absolute: 0.1 10*3/uL (ref 0.0–0.5)
Eosinophils Relative: 3 %
Immature Granulocytes: 0 %
Lymphocytes Relative: 85 %
Lymphs Abs: 1.4 10*3/uL (ref 0.7–4.0)
Monocytes Absolute: 0.1 10*3/uL (ref 0.1–1.0)
Monocytes Relative: 7 %
Neutro Abs: 0.1 10*3/uL — CL (ref 1.7–7.7)
Neutrophils Relative %: 4 %

## 2022-02-10 LAB — CBC
HCT: 27.3 % — ABNORMAL LOW (ref 39.0–52.0)
Hemoglobin: 8.7 g/dL — ABNORMAL LOW (ref 13.0–17.0)
MCH: 26.9 pg (ref 26.0–34.0)
MCHC: 31.9 g/dL (ref 30.0–36.0)
MCV: 84.3 fL (ref 80.0–100.0)
Platelets: 61 10*3/uL — ABNORMAL LOW (ref 150–400)
RBC: 3.24 MIL/uL — ABNORMAL LOW (ref 4.22–5.81)
RDW: 16.7 % — ABNORMAL HIGH (ref 11.5–15.5)
WBC: 1.7 10*3/uL — ABNORMAL LOW (ref 4.0–10.5)
nRBC: 0 % (ref 0.0–0.2)

## 2022-02-10 LAB — COMPREHENSIVE METABOLIC PANEL
ALT: 11 U/L (ref 0–44)
AST: 13 U/L — ABNORMAL LOW (ref 15–41)
Albumin: 3 g/dL — ABNORMAL LOW (ref 3.5–5.0)
Alkaline Phosphatase: 76 U/L (ref 38–126)
Anion gap: 8 (ref 5–15)
BUN: 34 mg/dL — ABNORMAL HIGH (ref 8–23)
CO2: 23 mmol/L (ref 22–32)
Calcium: 9.1 mg/dL (ref 8.9–10.3)
Chloride: 108 mmol/L (ref 98–111)
Creatinine, Ser: 1.93 mg/dL — ABNORMAL HIGH (ref 0.61–1.24)
GFR, Estimated: 35 mL/min — ABNORMAL LOW (ref >60)
Glucose, Bld: 147 mg/dL — ABNORMAL HIGH (ref 70–99)
Potassium: 4.3 mmol/L (ref 3.5–5.1)
Sodium: 139 mmol/L (ref 135–145)
Total Bilirubin: 1.1 mg/dL (ref 0.3–1.2)
Total Protein: 5.6 g/dL — ABNORMAL LOW (ref 6.5–8.1)

## 2022-02-10 LAB — GLUCOSE, CAPILLARY
Glucose-Capillary: 150 mg/dL — ABNORMAL HIGH (ref 70–99)
Glucose-Capillary: 215 mg/dL — ABNORMAL HIGH (ref 70–99)
Glucose-Capillary: 176 mg/dL — ABNORMAL HIGH (ref 70–99)
Glucose-Capillary: 207 mg/dL — ABNORMAL HIGH (ref 70–99)

## 2022-02-10 LAB — URINE CULTURE: Culture: NO GROWTH

## 2022-02-10 MED ORDER — PANTOPRAZOLE SODIUM 40 MG PO TBEC
40.0000 mg | DELAYED_RELEASE_TABLET | Freq: Two times a day (BID) | ORAL | Status: DC
  Administered 2022-02-10 – 2022-02-11 (×3): 40 mg via ORAL
  Filled 2022-02-10 (×3): qty 1

## 2022-02-10 MED ORDER — INSULIN GLARGINE-YFGN 100 UNIT/ML ~~LOC~~ SOLN
10.0000 [IU] | Freq: Every day | SUBCUTANEOUS | Status: DC
  Administered 2022-02-10 – 2022-02-11 (×2): 10 [IU] via SUBCUTANEOUS
  Filled 2022-02-10 (×2): qty 0.1

## 2022-02-10 MED ORDER — VANCOMYCIN HCL 750 MG/150ML IV SOLN
750.0000 mg | INTRAVENOUS | Status: DC
Start: 2022-02-11 — End: 2022-02-10

## 2022-02-10 MED ORDER — ZOLPIDEM TARTRATE 5 MG PO TABS
5.0000 mg | ORAL_TABLET | Freq: Every day | ORAL | Status: DC
  Administered 2022-02-10: 5 mg via ORAL
  Filled 2022-02-10: qty 1

## 2022-02-10 MED ORDER — AMLODIPINE BESYLATE 5 MG PO TABS
2.5000 mg | ORAL_TABLET | Freq: Every day | ORAL | Status: DC
  Administered 2022-02-10 – 2022-02-11 (×2): 2.5 mg via ORAL
  Filled 2022-02-10 (×2): qty 1

## 2022-02-10 MED ORDER — METOPROLOL TARTRATE 12.5 MG HALF TABLET
12.5000 mg | ORAL_TABLET | Freq: Two times a day (BID) | ORAL | Status: DC
  Administered 2022-02-10 – 2022-02-11 (×3): 12.5 mg via ORAL
  Filled 2022-02-10 (×3): qty 1

## 2022-02-10 MED ORDER — ADULT MULTIVITAMIN W/MINERALS CH
1.0000 | ORAL_TABLET | Freq: Every day | ORAL | Status: DC
  Administered 2022-02-10 – 2022-02-11 (×2): 1 via ORAL
  Filled 2022-02-10 (×2): qty 1

## 2022-02-10 MED ORDER — ENSURE ENLIVE PO LIQD
237.0000 mL | Freq: Three times a day (TID) | ORAL | Status: DC
  Administered 2022-02-10 – 2022-02-11 (×3): 237 mL via ORAL

## 2022-02-10 NOTE — Progress Notes (Signed)
Pharmacy Antibiotic Note ? ?[name] is a 81 y.o. male admitted on 02/09/2022 with neutropenia.  Pharmacy has been consulted for Vancomycin and Cefepime dosing. ? ?Plan: ?Continue Cefepime 2g IV q24h ?Vancomycin 1g IV x1 then 750 mg IV q36h.  (SCr 1.93, est AUC 417) ?Measure Vanc levels as needed.  Goal AUC = 400 - 550 ?Follow up renal function, culture results, and clinical course. ? ? ?Height: '5\' 7"'$  (170.2 cm) ?Weight: 52.8 kg (116 lb 6.5 oz) ?IBW/kg (Calculated) : 66.1 ? ?Temp (24hrs), Avg:98.2 ?F (36.8 ?C), Min:97.7 ?F (36.5 ?C), Max:98.9 ?F (37.2 ?C) ? ?Recent Labs  ?Lab 02/09/22 ?1023 02/10/22 ?0241  ?WBC 2.8* 1.7*  ?CREATININE 2.63* 1.93*  ?LATICACIDVEN 1.3  --   ?  ?Estimated Creatinine Clearance: 23.2 mL/min (A) (by C-G formula based on SCr of 1.93 mg/dL (H)).   ? ?Allergies  ?Allergen Reactions  ? Hydrocodone-Acetaminophen Other (See Comments)  ?  Loss of weight  ? ? ?Antimicrobials this admission: ?Vanc 5/15>> ?Cefepime 5/15>> ? ?Dose adjustments this admission: ? ? ?Microbiology results: ?5/15 MRSA PCR: not detected ?5/15 UCx: NGF ?5/15 Bcx: ngtd ? ?Thank you for allowing pharmacy to be a part of this patient?s care. ? ?Gretta Arab PharmD, BCPS ?Clinical Pharmacist ?[facility] [phone] ?02/10/2022 8:43 AM ? ? ?

## 2022-02-10 NOTE — Hospital Course (Addendum)
81 year old M with PMH of CLL, rectal cancer, NIDDM-2, CKD-3B, H. pylori infection, HTN and HLD presenting with nausea, vomiting, intermittent diarrhea, poor p.o. intake and generalized weakness, and admitted for SIRS, AKI, dehydration and neutropenia/pancytopenia.  Cultures obtained.  Started on broad-spectrum antibiotics and IV fluid. ? ?The next day, felt better.  AKI improved.  Cultures negative.  Antibiotics discontinued.  But pancytopenia/neutropenia worse.  Oncology following.  Remains on IV fluid.  PT/OT eval pending. ?

## 2022-02-10 NOTE — TOC Initial Note (Signed)
Transition of Care (TOC) - Initial/Assessment Note  ? ? ?Patient Details  ?Name: [name] ?MRN: [id number] ?Date of Birth: 03/13/1941 ? ?Transition of Care [facility]) CM/SW Contact:    ?Leeroy Cha, RN ?Phone Number: ?02/10/2022, 7:27 AM ? ?Clinical Narrative:                 ? ?Transition of Care (TOC) Screening Note ? ? ?Patient Details  ?Name: [name] ?Date of Birth: [birth year] ? ? ?Transition of Care [facility]) CM/SW Contact:    ?Leeroy Cha, RN ?Phone Number: ?02/10/2022, 7:27 AM ? ? ? ?Transition of Care Department [facility]) has reviewed patient and no TOC needs have been identified at this time. We will continue to monitor patient advancement through interdisciplinary progression rounds. If new patient transition needs arise, please place a TOC consult. ? ? ? ?Expected Discharge Plan: Home/Self Care ?Barriers to Discharge: Continued Medical Work up ? ? ?Patient Goals and CMS Choice ?Patient states their goals for this hospitalization and ongoing recovery are:: to go back to my home ?CMS Medicare.gov Compare Post Acute Care list provided to:: Patient ?  ? ?Expected Discharge Plan and Services ?Expected Discharge Plan: Home/Self Care ?  ?Discharge Planning Services: CM Consult ?  ?Living arrangements for the past 2 months: [place] ?                ?  ?  ?  ?  ?  ?  ?  ?  ?  ?  ? ?Prior Living Arrangements/Services ?Living arrangements for the past 2 months: [place] ?Lives with:: Spouse ?Patient language and need for interpreter reviewed:: Yes ?Do you feel safe going back to the place where you live?: Yes      ?Need for Family Participation in Patient Care: Yes (Comment) (wife) ?Care giver support system in place?: Yes (comment) (wife) ?  ?Criminal Activity/Legal Involvement Pertinent to Current Situation/Hospitalization: No - Comment as needed ? ?Activities of Daily Living ?Home Assistive Devices/Equipment: None ?ADL Screening (condition at time of admission) ?Patient's cognitive  ability adequate to safely complete daily activities?: Yes ?Is the patient deaf or have difficulty hearing?: No ?Does the patient have difficulty seeing, even when wearing glasses/contacts?: No ?Does the patient have difficulty concentrating, remembering, or making decisions?: No ?Patient able to express need for assistance with ADLs?: Yes ?Does the patient have difficulty dressing or bathing?: No ?Independently performs ADLs?: Yes (appropriate for developmental age) ?Does the patient have difficulty walking or climbing stairs?: No ?Weakness of Legs: None ?Weakness of Arms/Hands: None ? ?Permission Sought/Granted ?  ?  ?   ?   ?   ?   ? ?Emotional Assessment ?Appearance:: Appears stated age ?Attitude/Demeanor/Rapport: Engaged ?Affect (typically observed): Calm ?Orientation: : Oriented to Place, Oriented to Self, Oriented to  Time, Oriented to Situation ?Alcohol / Substance Use: Not Applicable ?Psych Involvement: No (comment) ? ?Admission diagnosis:  Weakness [R53.1] ?Tachycardia [R00.0] ?Sepsis ([place]) [A41.9] ?Neutropenia, unspecified type ([place]) [D70.9] ?Patient Active Problem List  ? Diagnosis Date Noted  ? Sepsis ([place]) 02/09/2022  ? Pancytopenia ([place]) 02/09/2022  ? Neutropenia ([place]) 02/09/2022  ? Protein-calorie malnutrition, severe ([place]) 02/09/2022  ? AKI (acute kidney injury) ([place]) 02/09/2022  ? Dehydration 02/09/2022  ? Pneumonia due to COVID-19 virus 10/22/2020  ? SIRS (systemic inflammatory response syndrome) ([place]) 03/25/2019  ? Hypertensive urgency 03/25/2019  ? Thrombocytopenia ([place]) 03/25/2019  ? Influenza A 09/18/2013  ? Syncope 09/17/2013  ? Diabetes mellitus ([place]) 09/17/2013  ? Hypertension   ?  Hypercholesterolemia   ? Chronic lymphocytic leukemia ([place]) 08/01/2011  ? Colon cancer ([place]) 08/01/2011  ? ?PCP:  [name], MD ?Pharmacy:   ?[id number] [address] [place] [facility] [address]?Phone: [phone]: [phone]? ?[facility]?Phone: [phone]: [phone]? ? ? ? ?Social Determinants of Health (SDOH) Interventions ?  ? ?Readmission Risk Interventions ?   ? View : No data to display.  ?  ?  ?  ? ? ? ?

## 2022-02-10 NOTE — Progress Notes (Signed)
IP PROGRESS NOTE ? ?Subjective:  ? ?Events noted.  Patient Known to me with history of CLL was currently on Imbruvica and recently withheld due to side effects.  Since that time he did report actually worsening symptoms including fatigue, nausea and dizziness.  His CBC on March 7 showed white cell count to be 3.1 with normal platelets and on admission his white cell count currently at 1.7 with a platelet count of 61.  Hemoglobin was 8.7. ? ?Clinically, he reports feeling better at this time without any major complaints.  He denies any fevers or chills. ? ? ?Objective: ? ?Vital signs in last 24 hours: ?Temp:  [97.7 ?F (36.5 ?C)-98.9 ?F (37.2 ?C)] 97.9 ?F (36.6 ?C) (05/16 0739) ?Pulse Rate:  [103-150] 110 (05/16 0600) ?Resp:  [11-26] 15 (05/16 0600) ?BP: (140-171)/(62-93) 162/81 (05/16 0600) ?SpO2:  [94 %-100 %] 99 % (05/16 0600) ?Weight:  [116 lb 6.5 oz (52.8 kg)-121 lb 4.1 oz (55 kg)] 116 lb 6.5 oz (52.8 kg) (05/15 1420) ?Weight change:  ?Last BM Date : 02/08/22 ? ?Intake/Output from previous day: ?05/15 0701 - 05/16 0700 ?In: 3207.2 [P.O.:240; I.V.:1680.1; IV Piggyback:1287.1] ?Out: 1300 [Urine:1300] ?General: Alert, awake without distress. ?Head: Normocephalic atraumatic. ?Mouth: mucous membranes moist, pharynx normal without lesions ?Eyes: No scleral icterus.  Pupils are equal and round reactive to light. ?Resp: clear to auscultation bilaterally without rhonchi or wheezes or dullness to percussion. ?Cardio: regular rate and rhythm, S1, S2 normal, no murmur, click, rub or gallop ?GI: soft, non-tender; bowel sounds normal; no masses,  no organomegaly ?Musculoskeletal: No joint deformity or effusion. ?Neurological: No motor, sensory deficits.  Intact deep tendon reflexes. ?Skin: No rashes or lesions. ? ? ?Lab Results: ?Recent Labs  ?  02/09/22 ?1023 02/10/22 ?0241  ?WBC 2.8* 1.7*  ?HGB 10.4* 8.7*  ?HCT 32.9* 27.3*  ?PLT 86* 61*  ? ? ?BMET ?Recent Labs  ?  02/09/22 ?1023 02/10/22 ?0241  ?NA 140 139  ?K 4.7 4.3  ?CL  102 108  ?CO2 25 23  ?GLUCOSE 286* 147*  ?BUN 42* 34*  ?CREATININE 2.63* 1.93*  ?CALCIUM 10.7* 9.1  ? ? ?Studies/Results: ?DG Chest 2 View ? ?Result Date: 02/09/2022 ?CLINICAL DATA:  81 year old male presenting with suspected sepsis and weakness. EXAM: CHEST - 2 VIEW COMPARISON:  October 22, 2020. FINDINGS: Cardiomediastinal contours and hilar structures are normal. Lungs are clear. Small bilateral pleural effusions. On limited assessment there is no acute skeletal process. IMPRESSION: 1. Small bilateral pleural effusions. 2. No sign of consolidation. Electronically Signed   By: Zetta Bills M.D.   On: 02/09/2022 10:02  ? ?DG Abd 1 View ? ?Result Date: 02/09/2022 ?CLINICAL DATA:  Nausea, vomiting. EXAM: ABDOMEN - 1 VIEW COMPARISON:  November 26, 2007. FINDINGS: The bowel gas pattern is normal. No radio-opaque calculi or other significant radiographic abnormality are seen. IMPRESSION: Negative. Electronically Signed   By: Marijo Conception M.D.   On: 02/09/2022 15:40  ? ?CT Head Wo Contrast ? ?Result Date: 02/09/2022 ?CLINICAL DATA:  Weakness, head injury after fall. EXAM: CT HEAD WITHOUT CONTRAST TECHNIQUE: Contiguous axial images were obtained from the base of the skull through the vertex without intravenous contrast. RADIATION DOSE REDUCTION: This exam was performed according to the departmental dose-optimization program which includes automated exposure control, adjustment of the mA and/or kV according to patient size and/or use of iterative reconstruction technique. COMPARISON:  October 22, 2020. FINDINGS: Brain: Mild chronic ischemic white matter disease is noted. No mass effect or midline shift  is noted. Ventricular size is within normal limits. There is no evidence of mass lesion, hemorrhage or acute infarction. Vascular: No hyperdense vessel or unexpected calcification. Skull: Normal. Negative for fracture or focal lesion. Sinuses/Orbits: No acute finding. Other: None. IMPRESSION: No acute intracranial  abnormality seen. Electronically Signed   By: Marijo Conception M.D.   On: 02/09/2022 11:05  ? ?US RENAL ? ?Result Date: 02/09/2022 ?CLINICAL DATA:  Acute kidney injury. EXAM: RENAL / URINARY TRACT ULTRASOUND COMPLETE COMPARISON:  None Available. FINDINGS: Right Kidney: Renal measurements: 11.11 x 4.5 x 4.0 cm = volume: 101 mL. Anechoic lesions with increased through transmission measure up to 2.5 x 1.8 x 2.6 cm. Increased parenchymal echogenicity. No hydronephrosis or solid mass. Left Kidney: Renal measurements: 10.7 x 5.0 x 4.2 cm = volume: 116 mL. Anechoic lesion with increased through transmission measures 1.8 x 1.7 x 1.6 cm. Increased parenchymal echogenicity. No hydronephrosis or solid mass. Bladder: Appears normal for degree of bladder distention. Other: None. IMPRESSION: 1. Increased renal parenchymal echogenicity, compatible with chronic medical renal disease. 2. Bilateral renal cysts. Electronically Signed   By: Lorin Picket M.D.   On: 02/09/2022 16:30   ? ?Medications: I have reviewed the patient's current medications. ? ?Assessment/Plan: ? ?81 year old with: ? ?1.  CLL longstanding dating back to 2014.  He was recently on Imbruvica was withheld due to to complete response to therapy.  His counts has declined since discontinuation of therapy and I recommended resuming that.  He can start taking it as an outpatient. ? ?2.  Pancytopenia: Likely related to CLL and therapy discontinuation.  Anticipate improvement after restarting Imbruvica.  He does not need any growth factor support at this time. ? ?3.  Infectious disease considerations: He is currently on broad-spectrum antibiotics.  These can be discontinued if cultures are negative without any fevers. ? ?4.  Acute kidney injury: His creatinine improves with hydration clinically appears to be improving. ? ?5.  Follow-up: We will arrange follow-up upon discharge.  ? ?35  minutes were dedicated to this visit.  50% of the time was face-to-face.  The time was  spent on reviewing laboratory data, imaging studies, discussing treatment options, discussing differential diagnosis and answering questions regarding future plan. ? ? ? LOS: 1 day  ? ?[name] 02/10/2022, 7:42 AM ? ?

## 2022-02-10 NOTE — Assessment & Plan Note (Addendum)
Low suspicion for infection at this time. Per oncology, likely due to discontinuation of Imbruvica.  Neutropenia worse today. ?-Oncology recommends resuming that.  He can start taking it as an outpatient. ?-Given GSF 5/17 ?

## 2022-02-10 NOTE — Assessment & Plan Note (Signed)
PT/OT

## 2022-02-10 NOTE — Assessment & Plan Note (Addendum)
-  Oncology following. Given dose of GSF 5/17 ?

## 2022-02-10 NOTE — Assessment & Plan Note (Addendum)
Recent Labs  ?  02/21/21 ?1433 05/23/21 ?0916 08/15/21 ?0908 12/02/21 ?1446 02/09/22 ?1023 02/10/22 ?0241 02/11/22 ?0451  ?BUN 27* 26* 23 34* 42* 34* 29*  ?CREATININE 1.92* 2.13* 2.18* 2.55* 2.63* 1.93* 1.66*  ?Likely prerenal from GI loss and poor p.o. intake.  AKI resolved.  Creatinine seems to be back to baseline.  Renal US suggests medical renal disease. ? ?

## 2022-02-10 NOTE — Assessment & Plan Note (Addendum)
Likely from dehydration.  Infectious work-up unrevealing.  Resolved except for leukopenia. ?-Discontinued antibiotics ?

## 2022-02-10 NOTE — Assessment & Plan Note (Signed)
Nutrition Status: ?Nutrition Problem: Severe Malnutrition ?Etiology: chronic illness, cancer and cancer related treatments ?Signs/Symptoms: moderate fat depletion, severe muscle depletion, energy intake < or equal to 75% for > or equal to 1 month ?Interventions: Liberalize Diet, MVI, Ensure Enlive (each supplement provides 350kcal and 20 grams of protein) ?

## 2022-02-10 NOTE — Assessment & Plan Note (Signed)
DNR/DNI

## 2022-02-10 NOTE — Assessment & Plan Note (Addendum)
A1c 6.9%.  On metformin at home.  He is on metformin at home. ?Recent Labs  ?Lab 02/10/22 ?0738 02/10/22 ?1125 02/10/22 ?1636 02/10/22 ?2137 02/11/22 ?0727  ?GLUCAP 150* 215* 176* 207* 144*  ?-Continued SSI-sensitive while in hospital ?-Added Semglee 10 units daily while in hospital ?-Renal function improved with GFR >40 and trending up, thus resumed metformin on d/c ? ?

## 2022-02-10 NOTE — Assessment & Plan Note (Signed)
-   Outpatient follow-up °

## 2022-02-10 NOTE — Assessment & Plan Note (Addendum)
Per oncology °

## 2022-02-10 NOTE — Assessment & Plan Note (Addendum)
Likely from GI loss and poor p.o. intake.  Improved.   ?

## 2022-02-10 NOTE — Assessment & Plan Note (Addendum)
He was recently on Pylera which could contribute.  He was of CLL medication for about a month or 2.  Seems to have resolved. ? ?

## 2022-02-10 NOTE — Assessment & Plan Note (Signed)
Improved.   ?-Resume home medications. ?

## 2022-02-10 NOTE — Progress Notes (Signed)
?PROGRESS NOTE ? ?[name] SAY:[id number] DOB: [birth year]  ? ?PCP: [name], MD ? ?Patient is from: Home.  Lives with his wife. ? ?DOA: 02/09/2022 LOS: 1 ? ?Chief complaints ?Chief Complaint  ?Patient presents with  ? Weakness  ?  ? ?Brief Narrative / Interim history: ?81 year old M with PMH of CLL, rectal cancer, NIDDM-2, CKD-3B, H. pylori infection, HTN and HLD presenting with nausea, vomiting, intermittent diarrhea, poor p.o. intake and generalized weakness, and admitted for SIRS, AKI, dehydration and neutropenia/pancytopenia.  Cultures obtained.  Started on broad-spectrum antibiotics and IV fluid. ? ?The next day, felt better.  AKI improved.  Cultures negative.  Antibiotics discontinued.  But pancytopenia/neutropenia worse.  Oncology following.  Remains on IV fluid.  PT/OT eval pending.  ? ?Subjective: ?Seen and examined earlier this afternoon.  No major events overnight of this morning.  He reports left-sided lower back pain that he attributes to his kidney.  This is not an acute pain.  Pain is intermittent.  He denies UTI symptoms.  Has not had further diarrhea or vomiting. ? ?Objective: ?Vitals:  ? 02/10/22 1100 02/10/22 1127 02/10/22 1200 02/10/22 1300  ?BP: (!) 141/64  131/73 (!) 146/66  ?Pulse: 88  89 93  ?Resp: '19  13 20  '$ ?Temp:  97.9 ?F (36.6 ?C)    ?TempSrc:  Oral    ?SpO2: 100%  100% 100%  ?Weight:      ?Height:      ? ? ?Examination: ? ?GENERAL: Appears frail.  Nontoxic. ?HEENT: MMM.  Vision and hearing grossly intact.  ?NECK: Supple.  No apparent JVD.  ?RESP:  No IWOB.  Fair aeration bilaterally. ?CVS:  RRR. Heart sounds normal.  ?ABD/GI/GU: BS+. Abd soft, NTND.  No CVA tenderness. ?MSK/EXT:  Moves extremities.  Significant muscle mass and subcu fat loss. ?SKIN: no apparent skin lesion or wound ?NEURO: Awake, alert and oriented x4 except date.  No apparent focal neuro deficit. ?PSYCH: Calm. Normal affect.  ? ?Procedures:  ?None ? ?Microbiology summarized: ?MRSA PCR screen nonreactive. ?Urine  and blood cultures NGTD. ? ?Assessment and Plan: ?Neutropenia in patient with hx of CLL ?Low suspicion for infection at this time. Per oncology, likely due to discontinuation of Imbruvica.  Neutropenia worse today. ?-Oncology recommends resuming that.  He can start taking it as an outpatient. ?-Oncology does not feel GSF is indicated yet. ?-Continue monitoring ? ?Nausea, vomiting and diarrhea ?He was recently on Pylera which could contribute.  He was of CLL medication for about a month or 2.  Seems to have resolved. ?-Continue IV fluid ? ?Dehydration ?Likely from GI loss and poor p.o. intake.  Improved.  Continue IV fluid. ? ?AKI on DKD-3B ?Recent Labs  ?  02/21/21 ?1433 05/23/21 ?0916 08/15/21 ?0908 12/02/21 ?1446 02/09/22 ?1023 02/10/22 ?0241  ?BUN 27* 26* 23 34* 42* 34*  ?CREATININE 1.92* 2.13* 2.18* 2.55* 2.63* 1.93*  ?Likely prerenal from GI loss and poor p.o. intake.  AKI resolved.  Creatinine seems to be back to baseline.  Renal US suggests medical renal disease. ?-Continue IV fluid ?-Continue monitoring ? ? ?SIRS (systemic inflammatory response syndrome) ([facility]) ?Likely from dehydration.  Infectious work-up unrevealing.  Resolved except for leukopenia. ?-Continue IV fluid hydration ?-Discontinue antibiotics ? ?Chronic lymphocytic leukemia ([place]) ?Per oncology ?-Continue monitoring ? ?Protein-calorie malnutrition, severe ([place]) ?Nutrition Status: ?Nutrition Problem: Severe Malnutrition ?Etiology: chronic illness, cancer and cancer related treatments ?Signs/Symptoms: moderate fat depletion, severe muscle depletion, energy intake < or equal to 75% for > or equal to 1 month ?  Interventions: Liberalize Diet, MVI, Ensure Enlive (each supplement provides 350kcal and 20 grams of protein) ? ?Pancytopenia ([place]) ?Looks worse today partly due to hemodilution from IV fluid. ?-Continue monitoring ?-Oncology following ? ?Colon cancer ([place]) ?Outpatient follow-up ? ?Goals of care, counseling/discussion ?DNR/DNI ? ?Physical  deconditioning ?PT/OT ? ?Diabetes mellitus ([place]) ?A1c 6.9%.  On metformin at home.  He is on metformin at home. ?Recent Labs  ?Lab 02/09/22 ?1647 02/09/22 ?2200 02/10/22 ?7622 02/10/22 ?1125  ?GLUCAP 336* 128* 150* 215*  ?-Continue SSI-sensitive. ?-Add Semglee 10 units daily ?-Need to discontinue metformin on discharge given his renal function ?-Continue home atorvastatin ? ? ?Hypertension ?Improved.   ?-Resume home medications. ? ? ?DVT prophylaxis:  ?Place and maintain sequential compression device Start: 02/10/22 1445 ? ?Code Status: DNR/DNI ?Family Communication: Updated patient's wife at bedside. ?Level of care: Med-Surg ?Status is: Inpatient ?Remains inpatient appropriate because: Neutropenia/pancytopenia, AKI and dehydration ? ? ?Final disposition: Likely home once medically stable. ?Consultants:  ?Oncology ? ?Sch Meds:  ?Scheduled Meds: ? amLODipine  2.5 mg Oral Daily  ? Chlorhexidine Gluconate Cloth  6 each Topical Daily  ? feeding supplement  237 mL Oral TID BM  ? insulin aspart  0-5 Units Subcutaneous QHS  ? insulin aspart  0-9 Units Subcutaneous TID WC  ? insulin glargine-yfgn  10 Units Subcutaneous Daily  ? metoprolol tartrate  12.5 mg Oral BID  ? multivitamin with minerals  1 tablet Oral Daily  ? pantoprazole  40 mg Oral BID  ? zolpidem  5 mg Oral QHS  ? ?Continuous Infusions: ? lactated ringers 100 mL/hr at 02/10/22 1130  ? ?PRN Meds:.acetaminophen **OR** acetaminophen, prochlorperazine ? ?Antimicrobials: ?Anti-infectives (From admission, onward)  ? ? Start     Dose/Rate Route Frequency Ordered Stop  ? 02/11/22 1400  vancomycin (VANCOREADY) IVPB 750 mg/150 mL  Status:  Discontinued       ? 750 mg ?150 mL/hr over 60 Minutes Intravenous Every 48 hours 02/09/22 1215 02/10/22 1136  ? 02/11/22 0200  vancomycin (VANCOREADY) IVPB 750 mg/150 mL  Status:  Discontinued       ? 750 mg ?150 mL/hr over 60 Minutes Intravenous Every 36 hours 02/10/22 1136 02/10/22 1240  ? 02/10/22 1300  ceFEPIme (MAXIPIME) 2 g in  sodium chloride 0.9 % 100 mL IVPB  Status:  Discontinued       ? 2 g ?200 mL/hr over 30 Minutes Intravenous Every 24 hours 02/09/22 1213 02/10/22 1240  ? 02/09/22 1215  ceFEPIme (MAXIPIME) 2 g in sodium chloride 0.9 % 100 mL IVPB       ? 2 g ?200 mL/hr over 30 Minutes Intravenous  Once 02/09/22 1208 02/09/22 1256  ? 02/09/22 1215  vancomycin (VANCOCIN) IVPB 1000 mg/200 mL premix       ? 1,000 mg ?200 mL/hr over 60 Minutes Intravenous  Once 02/09/22 1208 02/09/22 1353  ? ?  ? ? ? ?I have personally reviewed the following labs and images: ?CBC: ?Recent Labs  ?Lab 02/09/22 ?1023 02/10/22 ?0241  ?WBC 2.8* 1.7*  ?NEUTROABS 0.1* 0.1*  ?HGB 10.4* 8.7*  ?HCT 32.9* 27.3*  ?MCV 82.3 84.3  ?PLT 86* 61*  ? ?BMP &GFR ?Recent Labs  ?Lab 02/09/22 ?1023 02/10/22 ?0241  ?NA 140 139  ?K 4.7 4.3  ?CL 102 108  ?CO2 25 23  ?GLUCOSE 286* 147*  ?BUN 42* 34*  ?CREATININE 2.63* 1.93*  ?CALCIUM 10.7* 9.1  ? ?Estimated Creatinine Clearance: 23.2 mL/min (A) (by C-G formula based on SCr of 1.93 mg/dL (  H)). ?Liver & Pancreas: ?Recent Labs  ?Lab 02/09/22 ?1023 02/10/22 ?0241  ?AST 16 13*  ?ALT 11 11  ?ALKPHOS 105 76  ?BILITOT 1.0 1.1  ?PROT 6.9 5.6*  ?ALBUMIN 4.1 3.0*  ? ?Recent Labs  ?Lab 02/09/22 ?1023  ?LIPASE 32  ? ?No results for input(s): AMMONIA in the last 168 hours. ?Diabetic: ?Recent Labs  ?  02/09/22 ?1526  ?HGBA1C 6.9*  ? ?Recent Labs  ?Lab 02/09/22 ?1647 02/09/22 ?2200 02/10/22 ?2423 02/10/22 ?1125  ?GLUCAP 336* 128* 150* 215*  ? ?Cardiac Enzymes: ?No results for input(s): CKTOTAL, CKMB, CKMBINDEX, TROPONINI in the last 168 hours. ?No results for input(s): PROBNP in the last 8760 hours. ?Coagulation Profile: ?Recent Labs  ?Lab 02/09/22 ?1023  ?INR 1.0  ? ?Thyroid Function Tests: ?No results for input(s): TSH, T4TOTAL, FREET4, T3FREE, THYROIDAB in the last 72 hours. ?Lipid Profile: ?No results for input(s): CHOL, HDL, LDLCALC, TRIG, CHOLHDL, LDLDIRECT in the last 72 hours. ?Anemia Panel: ?No results for input(s): VITAMINB12, FOLATE,  FERRITIN, TIBC, IRON, RETICCTPCT in the last 72 hours. ?Urine analysis: ?   ?Component Value Date/Time  ? COLORURINE YELLOW 02/09/2022 1023  ? APPEARANCEUR HAZY (A) 02/09/2022 1023  ? LABSPEC 1.014 02/09/2022

## 2022-02-10 NOTE — Progress Notes (Signed)
Initial Nutrition Assessment ? ?DOCUMENTATION CODES:  ? ?Severe malnutrition in context of chronic illness, Underweight ? ?INTERVENTION:  ? ?-Ensure Plus High Protein po TID, each supplement provides 350 kcal and 20 grams of protein.  ? ?-Multivitamin with minerals daily ? ?-Liberalize diet to regular given poor PO intakes and severe malnutrition ? ?-Re-weigh patient ? ? ?NUTRITION DIAGNOSIS:  ? ?Severe Malnutrition related to chronic illness, cancer and cancer related treatments as evidenced by moderate fat depletion, severe muscle depletion, energy intake < or equal to 75% for > or equal to 1 month. ? ?GOAL:  ? ?Patient will meet greater than or equal to 90% of their needs ? ?MONITOR:  ? ?PO intake, Supplement acceptance, Labs, Weight trends, I & O's ? ?REASON FOR ASSESSMENT:  ? ?Consult ?Assessment of nutrition requirement/status ? ?ASSESSMENT:  ? ?81 y.o. male with medical history significant of CLL, HTN, HLD, rectal cancer, CLL, DM2. Presenting with generalized weakness. He reports that he was taken off a leukemia medication 2 months ago. Since that time he has had N/V and weakness. Admitted for SIRS. ? ?Patient in room, sitting in chair, lunch tray on table. Pt states he is trying to eat but nothing tastes good. He has had poor appetite for about a year now. Foods have been tasting excessively bland. Denies chewing or swallowing difficulty. ?Pt states he will eat a small amount at meal times. Has recently been having N/V later in the day. Intermittent diarrhea as well.  ? ?For lunch he has only eaten a bite of his chicken salad sandwich. Drinking hot tea.  ?States he drinks Ensure at home, ~2-3 daily. Will order Ensure supplements and provided one during visit.  ? ?Liberalized diet to regular given poor POs. Pt states he typically manages his blood sugars well at home.  ? ?Pt reports 20 lbs of weight loss. States he has not been weighed in the hospital and he weighed ~111 lbs at home when he weighed himself  PTA. ?Current weight recorded: 116 lbs. ? ?Medications: Lactated ringers ? ?Labs reviewed: ? CBGs: 128-336 ? ?NUTRITION - FOCUSED PHYSICAL EXAM: ? ?Flowsheet Row Most Recent Value  ?Orbital Region Moderate depletion  ?Upper Arm Region Severe depletion  ?Thoracic and Lumbar Region Moderate depletion  ?Buccal Region Moderate depletion  ?Temple Region Severe depletion  ?Clavicle Bone Region Severe depletion  ?Clavicle and Acromion Bone Region Severe depletion  ?Scapular Bone Region Severe depletion  ?Dorsal Hand Moderate depletion  ?Patellar Region Severe depletion  ?Anterior Thigh Region Severe depletion  ?Posterior Calf Region Severe depletion  ?Edema (RD Assessment) None  ?Hair Reviewed  ?Eyes Reviewed  ?Mouth Reviewed  ?Skin Reviewed  ? ?  ? ? ?Diet Order:   ?Diet Order   ? ?       ?  Diet regular Room service appropriate? Yes; Fluid consistency: Thin  Diet effective now       ?  ? ?  ?  ? ?  ? ? ?EDUCATION NEEDS:  ? ?No education needs have been identified at this time ? ?Skin:  Skin Assessment: Reviewed RN Assessment ? ?Last BM:  5/14 ? ?Height:  ? ?Ht Readings from Last 1 Encounters:  ?02/09/22 '5\' 7"'$  (1.702 m)  ? ? ?Weight:  ? ?Wt Readings from Last 1 Encounters:  ?02/09/22 52.8 kg  ? ? ?BMI:  Body mass index is 18.23 kg/m?. ? ?Estimated Nutritional Needs:  ? ?Kcal:  1800-2000 ? ?Protein:  95-105g ? ?Fluid:  2L/day ? ?Clayton Bibles, MS, RD,  LDN ?Inpatient Clinical Dietitian ?Contact information available via Amion ? ?

## 2022-02-11 ENCOUNTER — Other Ambulatory Visit: Payer: Self-pay | Admitting: *Deleted

## 2022-02-11 ENCOUNTER — Other Ambulatory Visit (HOSPITAL_COMMUNITY): Payer: Self-pay

## 2022-02-11 ENCOUNTER — Telehealth: Payer: Self-pay

## 2022-02-11 DIAGNOSIS — E860 Dehydration: Secondary | ICD-10-CM | POA: Diagnosis not present

## 2022-02-11 DIAGNOSIS — D61818 Other pancytopenia: Secondary | ICD-10-CM | POA: Diagnosis not present

## 2022-02-11 DIAGNOSIS — C9110 Chronic lymphocytic leukemia of B-cell type not having achieved remission: Secondary | ICD-10-CM | POA: Diagnosis not present

## 2022-02-11 DIAGNOSIS — C9112 Chronic lymphocytic leukemia of B-cell type in relapse: Secondary | ICD-10-CM | POA: Diagnosis not present

## 2022-02-11 LAB — CBC WITH DIFFERENTIAL/PLATELET
Abs Immature Granulocytes: 0.02 10*3/uL (ref 0.00–0.07)
Basophils Absolute: 0 10*3/uL (ref 0.0–0.1)
Basophils Relative: 0 %
Eosinophils Absolute: 0 10*3/uL (ref 0.0–0.5)
Eosinophils Relative: 4 %
HCT: 27 % — ABNORMAL LOW (ref 39.0–52.0)
Hemoglobin: 8.7 g/dL — ABNORMAL LOW (ref 13.0–17.0)
Immature Granulocytes: 2 %
Lymphocytes Relative: 77 %
Lymphs Abs: 0.7 10*3/uL (ref 0.7–4.0)
MCH: 26.8 pg (ref 26.0–34.0)
MCHC: 32.2 g/dL (ref 30.0–36.0)
MCV: 83.1 fL (ref 80.0–100.0)
Monocytes Absolute: 0.1 10*3/uL (ref 0.1–1.0)
Monocytes Relative: 11 %
Neutro Abs: 0.1 10*3/uL — CL (ref 1.7–7.7)
Neutrophils Relative %: 6 %
Platelets: 63 10*3/uL — ABNORMAL LOW (ref 150–400)
RBC: 3.25 MIL/uL — ABNORMAL LOW (ref 4.22–5.81)
RDW: 16.4 % — ABNORMAL HIGH (ref 11.5–15.5)
WBC: 0.9 10*3/uL — CL (ref 4.0–10.5)
nRBC: 0 % (ref 0.0–0.2)

## 2022-02-11 LAB — RENAL FUNCTION PANEL
Albumin: 2.8 g/dL — ABNORMAL LOW (ref 3.5–5.0)
Anion gap: 7 (ref 5–15)
BUN: 29 mg/dL — ABNORMAL HIGH (ref 8–23)
CO2: 24 mmol/L (ref 22–32)
Calcium: 8.7 mg/dL — ABNORMAL LOW (ref 8.9–10.3)
Chloride: 107 mmol/L (ref 98–111)
Creatinine, Ser: 1.66 mg/dL — ABNORMAL HIGH (ref 0.61–1.24)
GFR, Estimated: 42 mL/min — ABNORMAL LOW (ref >60)
Glucose, Bld: 156 mg/dL — ABNORMAL HIGH (ref 70–99)
Phosphorus: 2 mg/dL — ABNORMAL LOW (ref 2.5–4.6)
Potassium: 4 mmol/L (ref 3.5–5.1)
Sodium: 138 mmol/L (ref 135–145)

## 2022-02-11 LAB — MAGNESIUM: Magnesium: 1.4 mg/dL — ABNORMAL LOW (ref 1.7–2.4)

## 2022-02-11 LAB — GLUCOSE, CAPILLARY: Glucose-Capillary: 144 mg/dL — ABNORMAL HIGH (ref 70–99)

## 2022-02-11 MED ORDER — IBRUTINIB 420 MG PO TABS
420.0000 mg | ORAL_TABLET | Freq: Every day | ORAL | 0 refills | Status: AC
  Filled 2022-02-11 – 2022-02-13 (×2): qty 28, 28d supply, fill #0

## 2022-02-11 MED ORDER — TBO-FILGRASTIM 300 MCG/0.5ML ~~LOC~~ SOSY
300.0000 ug | PREFILLED_SYRINGE | Freq: Once | SUBCUTANEOUS | Status: DC
  Filled 2022-02-11: qty 0.5

## 2022-02-11 NOTE — Discharge Summary (Signed)
?Physician Discharge Summary ?  ?Patient: [name] MRN: [id number] DOB: Dec 02, 1940  ?Admit date:     02/09/2022  ?Discharge date: 02/11/22  ?Discharge Physician: Marylu Lund  ? ?PCP: [name], MD  ? ?Recommendations at discharge:  ? ? Follow up with PCP in 1-2 weeks ?Follow up with Dr. Alen Blew as scheduled ? ?Discharge Diagnoses: ?Active Problems: ?  Neutropenia in patient with hx of CLL ?  Chronic lymphocytic leukemia ([place]) ?  SIRS (systemic inflammatory response syndrome) (HCC) ?  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  Dehydration ?  Nausea, vomiting and diarrhea ?  Colon cancer ([place]) ?  Pancytopenia ([place]) ?  Protein-calorie malnutrition, severe ([place]) ?  Hypertension ?  Hypercholesterolemia ?  Diabetes mellitus ([place]) ?  Physical deconditioning ?  Goals of care, counseling/discussion ?  Diarrhea ? ?Resolved Problems: ?  * No resolved hospital problems. * ? ?Hospital Course: ?81 year old M with PMH of CLL, rectal cancer, NIDDM-2, CKD-3B, H. pylori infection, HTN and HLD presenting with nausea, vomiting, intermittent diarrhea, poor p.o. intake and generalized weakness, and admitted for SIRS, AKI, dehydration and neutropenia/pancytopenia.  Cultures obtained.  Started on broad-spectrum antibiotics and IV fluid. ? ?The next day, felt better.  AKI improved.  Cultures negative.  Antibiotics discontinued.  But pancytopenia/neutropenia worse.  Oncology following.  Remains on IV fluid.  PT/OT eval pending. ? ?Assessment and Plan: ?Neutropenia in patient with hx of CLL ?Low suspicion for infection at this time. Per oncology, likely due to discontinuation of Imbruvica.  Neutropenia worse today. ?-Oncology recommends resuming that.  He can start taking it as an outpatient. ?-Given GSF 5/17 ? ?Nausea, vomiting and diarrhea ?He was recently on Pylera which could contribute.  He was of CLL medication for about a month or 2.  Seems to have resolved. ? ? ?Dehydration ?Likely from GI loss and poor p.o. intake.  Improved.   ? ?AKI on  DKD-3B ?Recent Labs  ?  02/21/21 ?1433 05/23/21 ?0916 08/15/21 ?0908 12/02/21 ?1446 02/09/22 ?1023 02/10/22 ?0241 02/11/22 ?0451  ?BUN 27* 26* 23 34* 42* 34* 29*  ?CREATININE 1.92* 2.13* 2.18* 2.55* 2.63* 1.93* 1.66*  ?Likely prerenal from GI loss and poor p.o. intake.  AKI resolved.  Creatinine seems to be back to baseline.  Renal US suggests medical renal disease. ? ? ?SIRS (systemic inflammatory response syndrome) (HCC) ?Likely from dehydration.  Infectious work-up unrevealing.  Resolved except for leukopenia. ?-Discontinued antibiotics ? ?Chronic lymphocytic leukemia ([place]) ?Per oncology ? ? ?Protein-calorie malnutrition, severe ([place]) ?Nutrition Status: ?Nutrition Problem: Severe Malnutrition ?Etiology: chronic illness, cancer and cancer related treatments ?Signs/Symptoms: moderate fat depletion, severe muscle depletion, energy intake < or equal to 75% for > or equal to 1 month ?Interventions: Liberalize Diet, MVI, Ensure Enlive (each supplement provides 350kcal and 20 grams of protein) ? ?Pancytopenia ([place]) ?-Oncology following. Given dose of GSF 5/17 ? ?Colon cancer ([place]) ?Outpatient follow-up ? ?Goals of care, counseling/discussion ?DNR/DNI ? ?Physical deconditioning ?PT/OT ? ?Diabetes mellitus ([place]) ?A1c 6.9%.  On metformin at home.  He is on metformin at home. ?Recent Labs  ?Lab 02/10/22 ?0738 02/10/22 ?1125 02/10/22 ?1636 02/10/22 ?2137 02/11/22 ?0727  ?GLUCAP 150* 215* 176* 207* 144*  ?-Continued SSI-sensitive while in hospital ?-Added Semglee 10 units daily while in hospital ?-Renal function improved with GFR >40 and trending up, thus resumed metformin on d/c ? ? ?Hypertension ?Improved.   ?-Resume home medications. ? ? ? ? ?  ? ? ?Consultants: Oncology ?Procedures performed:   ?Disposition: Home ?Diet recommendation:  ?Carb modified diet ?  DISCHARGE MEDICATION: ?Allergies as of 02/11/2022   ? ?   Reactions  ? Hydrocodone-acetaminophen Other (See Comments)  ? Loss of weight  ? ?  ? ?  ?Medication List  ?   ? ?STOP taking these medications   ? ?bismuth-metronidazole-tetracycline 140-125-125 MG capsule ?Commonly known as: PYLERA ?  ?guaiFENesin-dextromethorphan 100-10 MG/5ML syrup ?Commonly known as: ROBITUSSIN DM ?  ? ?  ? ?TAKE these medications   ? ?amLODipine 2.5 MG tablet ?Commonly known as: NORVASC ?Take 2.5 mg by mouth daily. ?  ?aspirin 81 MG tablet ?Take 81 mg by mouth every morning. ?  ?atorvastatin 40 MG tablet ?Commonly known as: LIPITOR ?Take 40 mg by mouth daily. ?  ?famotidine 40 MG tablet ?Commonly known as: PEPCID ?Take 40 mg by mouth at bedtime. ?  ?hydroxypropyl methylcellulose / hypromellose 2.5 % ophthalmic solution ?Commonly known as: ISOPTO TEARS / GONIOVISC ?Place 1 drop into both eyes in the morning. ?  ?lisinopril 20 MG tablet ?Commonly known as: ZESTRIL ?Take 20 mg by mouth every morning. ?  ?metFORMIN 1000 MG tablet ?Commonly known as: GLUCOPHAGE ?Take 1,000 mg by mouth 2 (two) times daily with a meal. ?  ?metoprolol tartrate 25 MG tablet ?Commonly known as: LOPRESSOR ?Take 12.5 mg by mouth 2 (two) times daily. ?  ?ondansetron 8 MG disintegrating tablet ?Commonly known as: ZOFRAN-ODT ?TAKE 1 TABLET BY MOUTH EVERY 8 HOURS AS NEEDED FOR NAUSEA AND VOMITING ?What changed: See the new instructions. ?  ?pantoprazole 40 MG tablet ?Commonly known as: PROTONIX ?Take 1 tablet (40 mg total) by mouth 2 (two) times daily. ?What changed: when to take this ?  ?prochlorperazine 10 MG tablet ?Commonly known as: COMPAZINE ?TAKE 1 TABLET BY MOUTH EVERY 6 HOURS AS NEEDED FOR NAUSEA OR VOMITING. ?  ?zolpidem 10 MG tablet ?Commonly known as: AMBIEN ?Take 5 mg by mouth at bedtime. ?  ? ?  ? ? Follow-up Information   ? ? [name], MD Follow up in 2 week(s).   ?Specialty: Family Medicine ?Why: Hospital follow up ?Contact information: ?[address] ?[address] ?[place] [phone] ?[phone] ? ? ?  ?  ? ?  ?  ? ?  ? ?Discharge Exam: ?Filed Weights  ? 02/09/22 0941 02/09/22 1420  ?Weight: 55 kg 52.8 kg   ? ?General exam: Awake, laying in bed, in nad ?Respiratory system: Normal respiratory effort, no wheezing ?Cardiovascular system: regular rate, s1, s2 ?Gastrointestinal system: Soft, nondistended, positive BS ?Central nervous system: CN2-12 grossly intact, strength intact ?Extremities: Perfused, no clubbing ?Skin: Normal skin turgor, no notable skin lesions seen ?Psychiatry: Mood normal // no visual hallucinations  ? ?Condition at discharge: fair ? ?The results of significant diagnostics from this hospitalization (including imaging, microbiology, ancillary and laboratory) are listed below for reference.  ? ?Imaging Studies: ?DG Chest 2 View ? ?Result Date: 02/09/2022 ?CLINICAL DATA:  81 year old male presenting with suspected sepsis and weakness. EXAM: CHEST - 2 VIEW COMPARISON:  October 22, 2020. FINDINGS: Cardiomediastinal contours and hilar structures are normal. Lungs are clear. Small bilateral pleural effusions. On limited assessment there is no acute skeletal process. IMPRESSION: 1. Small bilateral pleural effusions. 2. No sign of consolidation. Electronically Signed   By: Zetta Bills M.D.   On: 02/09/2022 10:02  ? ?DG Abd 1 View ? ?Result Date: 02/09/2022 ?CLINICAL DATA:  Nausea, vomiting. EXAM: ABDOMEN - 1 VIEW COMPARISON:  November 26, 2007. FINDINGS: The bowel gas pattern is normal. No radio-opaque calculi or other significant radiographic abnormality  are seen. IMPRESSION: Negative. Electronically Signed   By: Marijo Conception M.D.   On: 02/09/2022 15:40  ? ?CT Head Wo Contrast ? ?Result Date: 02/09/2022 ?CLINICAL DATA:  Weakness, head injury after fall. EXAM: CT HEAD WITHOUT CONTRAST TECHNIQUE: Contiguous axial images were obtained from the base of the skull through the vertex without intravenous contrast. RADIATION DOSE REDUCTION: This exam was performed according to the departmental dose-optimization program which includes automated exposure control, adjustment of the mA and/or kV according to patient  size and/or use of iterative reconstruction technique. COMPARISON:  October 22, 2020. FINDINGS: Brain: Mild chronic ischemic white matter disease is noted. No mass effect or midline shift is noted. Ventricular size i

## 2022-02-11 NOTE — Telephone Encounter (Signed)
Oral Oncology Patient Advocate Encounter ? ?After completing a benefits investigation, prior authorization for [name] is not required at this time through [facility]. ? ?Patient's copay is $250.   ? ?Wynn Maudlin CPHT ?Specialty Pharmacy Patient Advocate ?[place] ?Phone [phone] ?Fax [phone] ?02/11/2022 3:51 PM ? ? ?  ? ?

## 2022-02-11 NOTE — Progress Notes (Signed)
IP PROGRESS NOTE ? ?Subjective:  ? ? ?[name] feels reasonably well without any complaints.  He still reports quite a bit of fatigue but no other complaints.  No fevers chills or sweats.  No bleeding. ? ?Objective: ? ?Vital signs in last 24 hours: ?Temp:  [97.6 ?F (36.4 ?C)-99.8 ?F (37.7 ?C)] 99.8 ?F (37.7 ?C) (05/17 0602) ?Pulse Rate:  [88-117] 107 (05/17 0602) ?Resp:  [13-20] 15 (05/17 0602) ?BP: (131-162)/(64-83) 156/80 (05/17 0602) ?SpO2:  [98 %-100 %] 99 % (05/17 0602) ?Weight change:  ?Last BM Date : 02/08/22 ? ?Intake/Output from previous day: ?05/16 0701 - 05/17 0700 ?In: 1747.7 [P.O.:960; I.V.:787.7] ?Out: 1500 [Urine:1500] ? ? ? ?General appearance: Comfortable appearing without any discomfort ?Head: Normocephalic without any trauma ?Oropharynx: Mucous membranes are moist and pink without any thrush or ulcers. ?Eyes: Pupils are equal and round reactive to light. ?Lymph nodes: No cervical, supraclavicular, inguinal or axillary lymphadenopathy.   ?Heart:regular rate and rhythm.  S1 and S2 without leg edema. ?Lung: Clear without any rhonchi or wheezes.  No dullness to percussion. ?Abdomin: Soft, nontender, nondistended with good bowel sounds.  No hepatosplenomegaly. ?Musculoskeletal: No joint deformity or effusion.  Full range of motion noted. ?Neurological: No deficits noted on motor, sensory and deep tendon reflex exam. ?Skin: No petechial rash or dryness.  Appeared moist.  ?. ? ? ? ?Lab Results: ?Recent Labs  ?  02/10/22 ?0241 02/11/22 ?0451  ?WBC 1.7* 0.9*  ?HGB 8.7* 8.7*  ?HCT 27.3* 27.0*  ?PLT 61* 63*  ? ? ?BMET ?Recent Labs  ?  02/10/22 ?0241 02/11/22 ?0451  ?NA 139 138  ?K 4.3 4.0  ?CL 108 107  ?CO2 23 24  ?GLUCOSE 147* 156*  ?BUN 34* 29*  ?CREATININE 1.93* 1.66*  ?CALCIUM 9.1 8.7*  ? ? ?Studies/Results: ?DG Chest 2 View ? ?Result Date: 02/09/2022 ?CLINICAL DATA:  81 year old male presenting with suspected sepsis and weakness. EXAM: CHEST - 2 VIEW COMPARISON:  October 22, 2020. FINDINGS:  Cardiomediastinal contours and hilar structures are normal. Lungs are clear. Small bilateral pleural effusions. On limited assessment there is no acute skeletal process. IMPRESSION: 1. Small bilateral pleural effusions. 2. No sign of consolidation. Electronically Signed   By: Zetta Bills M.D.   On: 02/09/2022 10:02  ? ?DG Abd 1 View ? ?Result Date: 02/09/2022 ?CLINICAL DATA:  Nausea, vomiting. EXAM: ABDOMEN - 1 VIEW COMPARISON:  November 26, 2007. FINDINGS: The bowel gas pattern is normal. No radio-opaque calculi or other significant radiographic abnormality are seen. IMPRESSION: Negative. Electronically Signed   By: Marijo Conception M.D.   On: 02/09/2022 15:40  ? ?CT Head Wo Contrast ? ?Result Date: 02/09/2022 ?CLINICAL DATA:  Weakness, head injury after fall. EXAM: CT HEAD WITHOUT CONTRAST TECHNIQUE: Contiguous axial images were obtained from the base of the skull through the vertex without intravenous contrast. RADIATION DOSE REDUCTION: This exam was performed according to the departmental dose-optimization program which includes automated exposure control, adjustment of the mA and/or kV according to patient size and/or use of iterative reconstruction technique. COMPARISON:  October 22, 2020. FINDINGS: Brain: Mild chronic ischemic white matter disease is noted. No mass effect or midline shift is noted. Ventricular size is within normal limits. There is no evidence of mass lesion, hemorrhage or acute infarction. Vascular: No hyperdense vessel or unexpected calcification. Skull: Normal. Negative for fracture or focal lesion. Sinuses/Orbits: No acute finding. Other: None. IMPRESSION: No acute intracranial abnormality seen. Electronically Signed   By: Bobbe Medico.D.  On: 02/09/2022 11:05  ? ?US RENAL ? ?Result Date: 02/09/2022 ?CLINICAL DATA:  Acute kidney injury. EXAM: RENAL / URINARY TRACT ULTRASOUND COMPLETE COMPARISON:  None Available. FINDINGS: Right Kidney: Renal measurements: 11.11 x 4.5 x 4.0 cm =  volume: 101 mL. Anechoic lesions with increased through transmission measure up to 2.5 x 1.8 x 2.6 cm. Increased parenchymal echogenicity. No hydronephrosis or solid mass. Left Kidney: Renal measurements: 10.7 x 5.0 x 4.2 cm = volume: 116 mL. Anechoic lesion with increased through transmission measures 1.8 x 1.7 x 1.6 cm. Increased parenchymal echogenicity. No hydronephrosis or solid mass. Bladder: Appears normal for degree of bladder distention. Other: None. IMPRESSION: 1. Increased renal parenchymal echogenicity, compatible with chronic medical renal disease. 2. Bilateral renal cysts. Electronically Signed   By: Lorin Picket M.D.   On: 02/09/2022 16:30   ? ?Medications: I have reviewed the patient's current medications. ? ?Assessment/Plan: ? ?81 year old with: ? ?1.  CLL with relapsed disease manifesting with lymphocytosis and pancytopenia.  I recommended restarting ibrutinib which she will do as an outpatient. ? ?2.  Neutropenia: He has predominantly lymphocytosis related to CLL likely the cause of his neutropenia in addition to a possible infection process. ? ?I recommend given a dose of Granix to boost his neutrophil count in anticipation of discharge in the near future. ? ? ?3.  Infectious disease considerations: He is currently on broad-spectrum antibiotics.  These can be discontinued if cultures are negative without any fevers. ? ?4.  Acute kidney injury: Returned to baseline after hydration. ? ?5.  Follow-up: I have no objections to discharge.  The primary team feel he is ready. ? ?25  minutes were dedicated to this visit.  50% of the time was face-to-face.  The time was dedicated to reviewing his differential diagnosis, management choices and addressing complications related to his cancer and cancer therapy. ? ? LOS: 2 days  ? ?[name] 02/11/2022, 7:24 AM ? ?

## 2022-02-12 ENCOUNTER — Other Ambulatory Visit: Payer: Self-pay | Admitting: Oncology

## 2022-02-12 DIAGNOSIS — C9110 Chronic lymphocytic leukemia of B-cell type not having achieved remission: Secondary | ICD-10-CM

## 2022-02-12 DIAGNOSIS — J069 Acute upper respiratory infection, unspecified: Secondary | ICD-10-CM | POA: Diagnosis not present

## 2022-02-13 ENCOUNTER — Other Ambulatory Visit (HOSPITAL_COMMUNITY): Payer: Self-pay

## 2022-02-14 LAB — CULTURE, BLOOD (ROUTINE X 2)
Culture: NO GROWTH
Culture: NO GROWTH
Special Requests: ADEQUATE
Special Requests: ADEQUATE

## 2022-02-19 ENCOUNTER — Observation Stay (HOSPITAL_COMMUNITY): Payer: Medicare Other

## 2022-02-19 ENCOUNTER — Other Ambulatory Visit: Payer: Self-pay

## 2022-02-19 ENCOUNTER — Encounter (HOSPITAL_BASED_OUTPATIENT_CLINIC_OR_DEPARTMENT_OTHER): Payer: Self-pay

## 2022-02-19 ENCOUNTER — Telehealth: Payer: Self-pay | Admitting: Oncology

## 2022-02-19 ENCOUNTER — Inpatient Hospital Stay (HOSPITAL_BASED_OUTPATIENT_CLINIC_OR_DEPARTMENT_OTHER)
Admission: EM | Admit: 2022-02-19 | Discharge: 2022-03-08 | DRG: 871 | Disposition: A | Discharge status: Home Health | Payer: Medicare Other | Attending: Internal Medicine | Admitting: Internal Medicine

## 2022-02-19 DIAGNOSIS — D696 Thrombocytopenia, unspecified: Secondary | ICD-10-CM | POA: Diagnosis present

## 2022-02-19 DIAGNOSIS — Z794 Long term (current) use of insulin: Secondary | ICD-10-CM | POA: Diagnosis not present

## 2022-02-19 DIAGNOSIS — R5081 Fever presenting with conditions classified elsewhere: Secondary | ICD-10-CM | POA: Diagnosis not present

## 2022-02-19 DIAGNOSIS — E119 Type 2 diabetes mellitus without complications: Secondary | ICD-10-CM

## 2022-02-19 DIAGNOSIS — R1313 Dysphagia, pharyngeal phase: Secondary | ICD-10-CM | POA: Diagnosis present

## 2022-02-19 DIAGNOSIS — Z7984 Long term (current) use of oral hypoglycemic drugs: Secondary | ICD-10-CM

## 2022-02-19 DIAGNOSIS — C9112 Chronic lymphocytic leukemia of B-cell type in relapse: Secondary | ICD-10-CM | POA: Diagnosis present

## 2022-02-19 DIAGNOSIS — D701 Agranulocytosis secondary to cancer chemotherapy: Secondary | ICD-10-CM | POA: Diagnosis not present

## 2022-02-19 DIAGNOSIS — Z7401 Bed confinement status: Secondary | ICD-10-CM

## 2022-02-19 DIAGNOSIS — I3139 Other pericardial effusion (noninflammatory): Secondary | ICD-10-CM | POA: Diagnosis present

## 2022-02-19 DIAGNOSIS — I129 Hypertensive chronic kidney disease with stage 1 through stage 4 chronic kidney disease, or unspecified chronic kidney disease: Secondary | ICD-10-CM | POA: Diagnosis present

## 2022-02-19 DIAGNOSIS — R7881 Bacteremia: Secondary | ICD-10-CM | POA: Diagnosis not present

## 2022-02-19 DIAGNOSIS — J90 Pleural effusion, not elsewhere classified: Secondary | ICD-10-CM | POA: Diagnosis present

## 2022-02-19 DIAGNOSIS — E878 Other disorders of electrolyte and fluid balance, not elsewhere classified: Secondary | ICD-10-CM | POA: Diagnosis not present

## 2022-02-19 DIAGNOSIS — R531 Weakness: Secondary | ICD-10-CM | POA: Diagnosis not present

## 2022-02-19 DIAGNOSIS — N179 Acute kidney failure, unspecified: Secondary | ICD-10-CM | POA: Diagnosis not present

## 2022-02-19 DIAGNOSIS — Z515 Encounter for palliative care: Secondary | ICD-10-CM

## 2022-02-19 DIAGNOSIS — R6510 Systemic inflammatory response syndrome (SIRS) of non-infectious origin without acute organ dysfunction: Secondary | ICD-10-CM

## 2022-02-19 DIAGNOSIS — Z4682 Encounter for fitting and adjustment of non-vascular catheter: Secondary | ICD-10-CM | POA: Diagnosis not present

## 2022-02-19 DIAGNOSIS — R918 Other nonspecific abnormal finding of lung field: Secondary | ICD-10-CM | POA: Diagnosis not present

## 2022-02-19 DIAGNOSIS — Z7982 Long term (current) use of aspirin: Secondary | ICD-10-CM

## 2022-02-19 DIAGNOSIS — Z9049 Acquired absence of other specified parts of digestive tract: Secondary | ICD-10-CM

## 2022-02-19 DIAGNOSIS — Z6822 Body mass index (BMI) 22.0-22.9, adult: Secondary | ICD-10-CM

## 2022-02-19 DIAGNOSIS — Z85048 Personal history of other malignant neoplasm of rectum, rectosigmoid junction, and anus: Secondary | ICD-10-CM

## 2022-02-19 DIAGNOSIS — I10 Essential (primary) hypertension: Secondary | ICD-10-CM | POA: Diagnosis present

## 2022-02-19 DIAGNOSIS — D630 Anemia in neoplastic disease: Secondary | ICD-10-CM | POA: Diagnosis present

## 2022-02-19 DIAGNOSIS — E43 Unspecified severe protein-calorie malnutrition: Secondary | ICD-10-CM | POA: Diagnosis not present

## 2022-02-19 DIAGNOSIS — K7689 Other specified diseases of liver: Secondary | ICD-10-CM | POA: Diagnosis not present

## 2022-02-19 DIAGNOSIS — C189 Malignant neoplasm of colon, unspecified: Secondary | ICD-10-CM | POA: Diagnosis present

## 2022-02-19 DIAGNOSIS — Z66 Do not resuscitate: Secondary | ICD-10-CM | POA: Diagnosis present

## 2022-02-19 DIAGNOSIS — R296 Repeated falls: Secondary | ICD-10-CM | POA: Diagnosis present

## 2022-02-19 DIAGNOSIS — E1165 Type 2 diabetes mellitus with hyperglycemia: Secondary | ICD-10-CM | POA: Diagnosis not present

## 2022-02-19 DIAGNOSIS — Z8041 Family history of malignant neoplasm of ovary: Secondary | ICD-10-CM

## 2022-02-19 DIAGNOSIS — A4181 Sepsis due to Enterococcus: Principal | ICD-10-CM | POA: Diagnosis present

## 2022-02-19 DIAGNOSIS — J9811 Atelectasis: Secondary | ICD-10-CM | POA: Diagnosis not present

## 2022-02-19 DIAGNOSIS — N1832 Chronic kidney disease, stage 3b: Secondary | ICD-10-CM | POA: Diagnosis not present

## 2022-02-19 DIAGNOSIS — B952 Enterococcus as the cause of diseases classified elsewhere: Secondary | ICD-10-CM

## 2022-02-19 DIAGNOSIS — K921 Melena: Secondary | ICD-10-CM | POA: Diagnosis not present

## 2022-02-19 DIAGNOSIS — Z79899 Other long term (current) drug therapy: Secondary | ICD-10-CM

## 2022-02-19 DIAGNOSIS — E876 Hypokalemia: Secondary | ICD-10-CM | POA: Diagnosis not present

## 2022-02-19 DIAGNOSIS — E8779 Other fluid overload: Secondary | ICD-10-CM | POA: Diagnosis not present

## 2022-02-19 DIAGNOSIS — T451X5A Adverse effect of antineoplastic and immunosuppressive drugs, initial encounter: Secondary | ICD-10-CM | POA: Diagnosis present

## 2022-02-19 DIAGNOSIS — X58XXXA Exposure to other specified factors, initial encounter: Secondary | ICD-10-CM | POA: Diagnosis present

## 2022-02-19 DIAGNOSIS — E1122 Type 2 diabetes mellitus with diabetic chronic kidney disease: Secondary | ICD-10-CM | POA: Diagnosis present

## 2022-02-19 DIAGNOSIS — D61818 Other pancytopenia: Secondary | ICD-10-CM | POA: Diagnosis present

## 2022-02-19 DIAGNOSIS — R197 Diarrhea, unspecified: Secondary | ICD-10-CM | POA: Diagnosis not present

## 2022-02-19 DIAGNOSIS — Z885 Allergy status to narcotic agent status: Secondary | ICD-10-CM

## 2022-02-19 DIAGNOSIS — D702 Other drug-induced agranulocytosis: Secondary | ICD-10-CM | POA: Diagnosis present

## 2022-02-19 DIAGNOSIS — R6520 Severe sepsis without septic shock: Secondary | ICD-10-CM | POA: Diagnosis not present

## 2022-02-19 DIAGNOSIS — E7800 Pure hypercholesterolemia, unspecified: Secondary | ICD-10-CM | POA: Diagnosis present

## 2022-02-19 DIAGNOSIS — N281 Cyst of kidney, acquired: Secondary | ICD-10-CM | POA: Diagnosis not present

## 2022-02-19 DIAGNOSIS — D709 Neutropenia, unspecified: Secondary | ICD-10-CM | POA: Diagnosis present

## 2022-02-19 DIAGNOSIS — Z87891 Personal history of nicotine dependence: Secondary | ICD-10-CM

## 2022-02-19 DIAGNOSIS — H18413 Arcus senilis, bilateral: Secondary | ICD-10-CM | POA: Diagnosis present

## 2022-02-19 DIAGNOSIS — I700 Atherosclerosis of aorta: Secondary | ICD-10-CM | POA: Diagnosis present

## 2022-02-19 DIAGNOSIS — J9600 Acute respiratory failure, unspecified whether with hypoxia or hypercapnia: Secondary | ICD-10-CM | POA: Diagnosis not present

## 2022-02-19 DIAGNOSIS — A419 Sepsis, unspecified organism: Secondary | ICD-10-CM | POA: Diagnosis not present

## 2022-02-19 DIAGNOSIS — Z4659 Encounter for fitting and adjustment of other gastrointestinal appliance and device: Secondary | ICD-10-CM | POA: Diagnosis not present

## 2022-02-19 DIAGNOSIS — R627 Adult failure to thrive: Secondary | ICD-10-CM | POA: Diagnosis not present

## 2022-02-19 DIAGNOSIS — E860 Dehydration: Secondary | ICD-10-CM | POA: Diagnosis not present

## 2022-02-19 DIAGNOSIS — Z803 Family history of malignant neoplasm of breast: Secondary | ICD-10-CM

## 2022-02-19 DIAGNOSIS — E871 Hypo-osmolality and hyponatremia: Secondary | ICD-10-CM | POA: Diagnosis not present

## 2022-02-19 DIAGNOSIS — R739 Hyperglycemia, unspecified: Secondary | ICD-10-CM

## 2022-02-19 DIAGNOSIS — Z7189 Other specified counseling: Secondary | ICD-10-CM | POA: Diagnosis not present

## 2022-02-19 DIAGNOSIS — I358 Other nonrheumatic aortic valve disorders: Secondary | ICD-10-CM | POA: Diagnosis present

## 2022-02-19 DIAGNOSIS — C9110 Chronic lymphocytic leukemia of B-cell type not having achieved remission: Secondary | ICD-10-CM | POA: Diagnosis not present

## 2022-02-19 DIAGNOSIS — E8809 Other disorders of plasma-protein metabolism, not elsewhere classified: Secondary | ICD-10-CM | POA: Diagnosis present

## 2022-02-19 LAB — CBC WITH DIFFERENTIAL/PLATELET
Abs Immature Granulocytes: 0 10*3/uL (ref 0.00–0.07)
Basophils Absolute: 0 10*3/uL (ref 0.0–0.1)
Basophils Relative: 0 %
Eosinophils Absolute: 0 10*3/uL (ref 0.0–0.5)
Eosinophils Relative: 0 %
HCT: 28.3 % — ABNORMAL LOW (ref 39.0–52.0)
Hemoglobin: 9.4 g/dL — ABNORMAL LOW (ref 13.0–17.0)
Immature Granulocytes: 0 %
Lymphocytes Relative: 82 %
Lymphs Abs: 1 10*3/uL (ref 0.7–4.0)
MCH: 26.4 pg (ref 26.0–34.0)
MCHC: 33.2 g/dL (ref 30.0–36.0)
MCV: 79.5 fL — ABNORMAL LOW (ref 80.0–100.0)
Monocytes Absolute: 0.1 10*3/uL (ref 0.1–1.0)
Monocytes Relative: 7 %
Neutro Abs: 0.1 10*3/uL — CL (ref 1.7–7.7)
Neutrophils Relative %: 11 %
Platelets: 195 10*3/uL (ref 150–400)
RBC: 3.56 MIL/uL — ABNORMAL LOW (ref 4.22–5.81)
RDW: 16.7 % — ABNORMAL HIGH (ref 11.5–15.5)
WBC: 1.2 10*3/uL — CL (ref 4.0–10.5)
nRBC: 0 % (ref 0.0–0.2)

## 2022-02-19 LAB — COMPREHENSIVE METABOLIC PANEL
ALT: 10 U/L (ref 0–44)
AST: 10 U/L — ABNORMAL LOW (ref 15–41)
Albumin: 3.5 g/dL (ref 3.5–5.0)
Alkaline Phosphatase: 103 U/L (ref 38–126)
Anion gap: 13 (ref 5–15)
BUN: 41 mg/dL — ABNORMAL HIGH (ref 8–23)
CO2: 22 mmol/L (ref 22–32)
Calcium: 9.1 mg/dL (ref 8.9–10.3)
Chloride: 96 mmol/L — ABNORMAL LOW (ref 98–111)
Creatinine, Ser: 1.74 mg/dL — ABNORMAL HIGH (ref 0.61–1.24)
GFR, Estimated: 39 mL/min — ABNORMAL LOW (ref >60)
Glucose, Bld: 327 mg/dL — ABNORMAL HIGH (ref 70–99)
Potassium: 4.1 mmol/L (ref 3.5–5.1)
Sodium: 131 mmol/L — ABNORMAL LOW (ref 135–145)
Total Bilirubin: 0.9 mg/dL (ref 0.3–1.2)
Total Protein: 6.2 g/dL — ABNORMAL LOW (ref 6.5–8.1)

## 2022-02-19 LAB — URINALYSIS, ROUTINE W REFLEX MICROSCOPIC
Bilirubin Urine: NEGATIVE
Glucose, UA: >1000 mg/dL — AB
Ketones, ur: NEGATIVE mg/dL
Leukocytes,Ua: NEGATIVE
Nitrite: NEGATIVE
Protein, ur: 30 mg/dL — AB
Specific Gravity, Urine: 1.015 (ref 1.005–1.030)
pH: 5.5 (ref 5.0–8.0)

## 2022-02-19 LAB — LACTIC ACID, PLASMA
Lactic Acid, Venous: 3.4 mmol/L (ref 0.5–1.9)
Lactic Acid, Venous: 1.8 mmol/L (ref 0.5–1.9)

## 2022-02-19 LAB — GLUCOSE, CAPILLARY
Glucose-Capillary: 256 mg/dL — ABNORMAL HIGH (ref 70–99)
Glucose-Capillary: 156 mg/dL — ABNORMAL HIGH (ref 70–99)

## 2022-02-19 LAB — PATHOLOGIST SMEAR REVIEW

## 2022-02-19 LAB — CBG MONITORING, ED: Glucose-Capillary: 173 mg/dL — ABNORMAL HIGH (ref 70–99)

## 2022-02-19 MED ORDER — IBRUTINIB 420 MG PO TABS: 420.0000 mg | ORAL_TABLET | Freq: Every day | ORAL | Status: DC

## 2022-02-19 MED ORDER — IOHEXOL 9 MG/ML PO SOLN
500.0000 mL | ORAL | Status: AC
  Administered 2022-02-19 (×2): 500 mL via ORAL

## 2022-02-19 MED ORDER — IOHEXOL 9 MG/ML PO SOLN
ORAL | Status: AC
  Filled 2022-02-19: qty 1000

## 2022-02-19 MED ORDER — LACTATED RINGERS IV SOLN: INTRAVENOUS | Status: DC

## 2022-02-19 MED ORDER — INSULIN ASPART 100 UNIT/ML IJ SOLN
0.0000 [IU] | Freq: Every day | INTRAMUSCULAR | Status: DC
  Administered 2022-02-20 – 2022-02-21 (×2): 2 [IU] via SUBCUTANEOUS
  Administered 2022-02-22: 0 [IU] via SUBCUTANEOUS

## 2022-02-19 MED ORDER — ORAL CARE MOUTH RINSE
15.0000 mL | Freq: Two times a day (BID) | OROMUCOSAL | Status: DC
  Administered 2022-02-19 – 2022-02-23 (×9): 15 mL via OROMUCOSAL

## 2022-02-19 MED ORDER — LACTATED RINGERS IV BOLUS
1000.0000 mL | Freq: Once | INTRAVENOUS | Status: AC
  Administered 2022-02-19: 1000 mL via INTRAVENOUS

## 2022-02-19 MED ORDER — ENSURE ENLIVE PO LIQD
237.0000 mL | Freq: Two times a day (BID) | ORAL | Status: DC
  Administered 2022-02-19: 237 mL via ORAL

## 2022-02-19 MED ORDER — GUAIFENESIN-DM 100-10 MG/5ML PO SYRP
5.0000 mL | ORAL_SOLUTION | ORAL | Status: DC | PRN
  Administered 2022-02-19 – 2022-02-20 (×2): 5 mL via ORAL
  Filled 2022-02-19 (×2): qty 10

## 2022-02-19 MED ORDER — FAMOTIDINE 20 MG PO TABS
40.0000 mg | ORAL_TABLET | Freq: Every day | ORAL | Status: DC
  Administered 2022-02-19: 40 mg via ORAL
  Filled 2022-02-19: qty 2

## 2022-02-19 MED ORDER — ACETAMINOPHEN 325 MG PO TABS
650.0000 mg | ORAL_TABLET | Freq: Four times a day (QID) | ORAL | Status: DC | PRN
  Administered 2022-02-19: 650 mg via ORAL
  Filled 2022-02-19: qty 2

## 2022-02-19 MED ORDER — LABETALOL HCL 5 MG/ML IV SOLN
10.0000 mg | INTRAVENOUS | Status: DC | PRN
  Administered 2022-02-19 – 2022-02-20 (×2): 10 mg via INTRAVENOUS
  Filled 2022-02-19 (×2): qty 4

## 2022-02-19 MED ORDER — METOPROLOL TARTRATE 12.5 MG HALF TABLET
12.5000 mg | ORAL_TABLET | Freq: Two times a day (BID) | ORAL | Status: DC
Start: 2022-02-19 — End: 2022-02-20
  Administered 2022-02-19 – 2022-02-20 (×3): 12.5 mg via ORAL
  Filled 2022-02-19 (×3): qty 1

## 2022-02-19 MED ORDER — ASPIRIN 81 MG PO TBEC
81.0000 mg | DELAYED_RELEASE_TABLET | Freq: Every morning | ORAL | Status: DC
  Administered 2022-02-19 – 2022-02-20 (×2): 81 mg via ORAL
  Filled 2022-02-19 (×2): qty 1

## 2022-02-19 MED ORDER — HEPARIN SODIUM (PORCINE) 5000 UNIT/ML IJ SOLN
5000.0000 [IU] | Freq: Three times a day (TID) | INTRAMUSCULAR | Status: DC
  Administered 2022-02-19 – 2022-03-01 (×30): 5000 [IU] via SUBCUTANEOUS
  Filled 2022-02-19 (×30): qty 1

## 2022-02-19 MED ORDER — INSULIN ASPART 100 UNIT/ML IJ SOLN
5.0000 [IU] | Freq: Once | INTRAMUSCULAR | Status: AC
  Administered 2022-02-19: 5 [IU] via SUBCUTANEOUS

## 2022-02-19 MED ORDER — ATORVASTATIN CALCIUM 40 MG PO TABS
40.0000 mg | ORAL_TABLET | Freq: Every day | ORAL | Status: DC
  Administered 2022-02-19 – 2022-02-20 (×2): 40 mg via ORAL
  Filled 2022-02-19 (×2): qty 1

## 2022-02-19 MED ORDER — INSULIN ASPART 100 UNIT/ML IJ SOLN
0.0000 [IU] | Freq: Three times a day (TID) | INTRAMUSCULAR | Status: DC
  Administered 2022-02-19: 5 [IU] via SUBCUTANEOUS
  Administered 2022-02-20 (×3): 2 [IU] via SUBCUTANEOUS
  Administered 2022-02-21: 3 [IU] via SUBCUTANEOUS
  Administered 2022-02-21: 5 [IU] via SUBCUTANEOUS
  Administered 2022-02-21: 3 [IU] via SUBCUTANEOUS
  Administered 2022-02-22: 7 [IU] via SUBCUTANEOUS
  Administered 2022-02-22 (×2): 2 [IU] via SUBCUTANEOUS
  Administered 2022-02-23: 9 [IU] via SUBCUTANEOUS

## 2022-02-19 MED ORDER — PROCHLORPERAZINE MALEATE 10 MG PO TABS: 10.0000 mg | ORAL_TABLET | Freq: Four times a day (QID) | ORAL | Status: DC | PRN

## 2022-02-19 MED ORDER — CHLORHEXIDINE GLUCONATE CLOTH 2 % EX PADS
6.0000 | MEDICATED_PAD | Freq: Every day | CUTANEOUS | Status: DC
  Administered 2022-02-19 – 2022-02-22 (×3): 6 via TOPICAL

## 2022-02-19 MED ORDER — PANTOPRAZOLE SODIUM 40 MG PO TBEC
40.0000 mg | DELAYED_RELEASE_TABLET | Freq: Every day | ORAL | Status: DC
  Administered 2022-02-19: 40 mg via ORAL
  Filled 2022-02-19: qty 1

## 2022-02-19 MED ORDER — TBO-FILGRASTIM 300 MCG/0.5ML ~~LOC~~ SOSY
300.0000 ug | PREFILLED_SYRINGE | Freq: Every day | SUBCUTANEOUS | Status: AC
  Administered 2022-02-19 – 2022-02-21 (×3): 300 ug via SUBCUTANEOUS
  Filled 2022-02-19 (×3): qty 0.5

## 2022-02-19 MED ORDER — ONDANSETRON HCL 4 MG PO TABS: 4.0000 mg | ORAL_TABLET | Freq: Four times a day (QID) | ORAL | Status: DC | PRN

## 2022-02-19 MED ORDER — DRONABINOL 5 MG PO CAPS
5.0000 mg | ORAL_CAPSULE | Freq: Two times a day (BID) | ORAL | Status: DC
  Administered 2022-02-19 – 2022-02-20 (×2): 5 mg via ORAL
  Filled 2022-02-19 (×2): qty 1

## 2022-02-19 MED ORDER — ONDANSETRON HCL 4 MG/2ML IJ SOLN: 4.0000 mg | Freq: Four times a day (QID) | INTRAMUSCULAR | Status: DC | PRN

## 2022-02-19 NOTE — ED Provider Notes (Signed)
[facility] Provider Note: Georgena Spurling, MD, FACEP  CSN: [id number] MRN: [id number] ARRIVAL: 02/19/22 at 0200 ROOM: DB009/DB009   CHIEF COMPLAINT  Weakness   HISTORY OF PRESENT ILLNESS  02/19/22 4:59 AM [name] is a 81 y.o. male with a history of chronic lymphocytic leukemia.  He has had ongoing weakness and was admitted to [facility] on 02/09/2022 for weakness, dehydration, malnutrition and neutropenia.  He had been on Imbruvica but this was discontinued by his oncologist and he associates the beginning of his decline with that discontinuation.  The Imbruvica was restarted on his recent hospitalization but he has not improved despite this.  He has not been eating or drinking since he got home from the hospital and has had several falls (not severe, traumatic falls but rather sliding to the ground out of weakness, usually when getting up to go to the bathroom).  His wife has to help him perform activities of daily living.  He denies pain, shortness of breath or nausea.   Past Medical History:  Diagnosis Date   CLL (chronic lymphocytic leukemia) ([place])    chemotherapy   Colon cancer ([place])    Colorectal cancer ([place])    COLORECTAL DX 2/09, treated surgically   Diabetes mellitus    Hypercholesterolemia    Hypertension    Pneumonia    Bilateral pneumonia    Past Surgical History:  Procedure Laterality Date   HEMICOLECTOMY     nasal polyps     S/P nasal surgery    Family History  Problem Relation Age of Onset   Ovarian cancer Mother    Breast cancer Maternal Aunt     Social History   Tobacco Use   Smoking status: Former    Packs/day: 0.80    Years: 35.00    Pack years: 28.00    Types: Cigarettes   Smokeless tobacco: Never  Vaping Use   Vaping Use: Never used  Substance Use Topics   Alcohol use: No   Drug use: No    Prior to Admission medications   Medication Sig Start Date End Date Taking? Authorizing Provider  amLODipine (NORVASC) 2.5 MG  tablet Take 2.5 mg by mouth daily.    [provider]  aspirin 81 MG tablet Take 81 mg by mouth every morning.     [provider]  atorvastatin (LIPITOR) 40 MG tablet Take 40 mg by mouth daily. 01/10/13   [provider]  famotidine (PEPCID) 40 MG tablet Take 40 mg by mouth at bedtime. 11/13/21   [provider]  hydroxypropyl methylcellulose / hypromellose (ISOPTO TEARS / GONIOVISC) 2.5 % ophthalmic solution Place 1 drop into both eyes in the morning.    [provider]  ibrutinib (IMBRUVICA) 420 MG tablet TAKE 1 TABLET BY MOUTH DAILY 02/11/22 02/11/23  Wyatt Portela, MD  lisinopril (ZESTRIL) 20 MG tablet Take 20 mg by mouth every morning. 12/14/18   [provider]  metFORMIN (GLUCOPHAGE) 1000 MG tablet Take 1,000 mg by mouth 2 (two) times daily with a meal. 06/05/17   [provider]  metoprolol tartrate (LOPRESSOR) 25 MG tablet Take 12.5 mg by mouth 2 (two) times daily.    [provider]  ondansetron (ZOFRAN-ODT) 8 MG disintegrating tablet TAKE 1 TABLET BY MOUTH EVERY 8 HOURS AS NEEDED FOR NAUSEA AND VOMITING Patient taking differently: Take 8 mg by mouth every 8 (eight) hours as needed for nausea or vomiting. 02/02/22   Wyatt Portela, MD  pantoprazole (PROTONIX)  40 MG tablet Take 1 tablet (40 mg total) by mouth 2 (two) times daily. Patient taking differently: Take 40 mg by mouth daily. 01/19/22   Pyrtle, Lajuan Lines, MD  prochlorperazine (COMPAZINE) 10 MG tablet TAKE 1 TABLET BY MOUTH EVERY 6 HOURS AS NEEDED FOR NAUSEA OR VOMITING. 02/13/22   Wyatt Portela, MD  zolpidem (AMBIEN) 10 MG tablet Take 5 mg by mouth at bedtime. 10/31/19   [provider]    Allergies Hydrocodone-acetaminophen   REVIEW OF SYSTEMS  Negative except as noted here or in the History of Present Illness.   PHYSICAL EXAMINATION  Initial Vital Signs Blood pressure (!) 145/62, pulse (!) 128, temperature 98.9 F (37.2 C), resp. rate (!) 21, SpO2 100  %.  Examination General: Well-developed, cachectic male in no acute distress; appearance consistent with age of record HENT: normocephalic; atraumatic Eyes: pupils equal, round and reactive to light; extraocular muscles intact; arcus senilis bilaterally Neck: supple Heart: regular rate and rhythm; tachycardia Lungs: clear to auscultation bilaterally Abdomen: soft; nondistended; nontender; bowel sounds present Extremities: No deformity; full range of motion; pulses normal Neurologic: Awake, alert; motor function intact in all extremities and symmetric; no facial droop Skin: Warm and dry Psychiatric: Flat affect   RESULTS  Summary of this visit's results, reviewed and interpreted by myself:   EKG Interpretation  Date/Time:    Ventricular Rate:    PR Interval:    QRS Duration:   QT Interval:    QTC Calculation:   R Axis:     Text Interpretation:         Laboratory Studies: Results for orders placed or performed during the hospital encounter of 02/19/22 (from the past 24 hour(s))  Lactic acid, plasma     Status: Abnormal   Collection Time: 02/19/22  2:35 AM  Result Value Ref Range   Lactic Acid, Venous 3.4 (HH) 0.5 - 1.9 mmol/L  Comprehensive metabolic panel     Status: Abnormal   Collection Time: 02/19/22  2:35 AM  Result Value Ref Range   Sodium 131 (L) 135 - 145 mmol/L   Potassium 4.1 3.5 - 5.1 mmol/L   Chloride 96 (L) 98 - 111 mmol/L   CO2 22 22 - 32 mmol/L   Glucose, Bld 327 (H) 70 - 99 mg/dL   BUN 41 (H) 8 - 23 mg/dL   Creatinine, Ser 1.74 (H) 0.61 - 1.24 mg/dL   Calcium 9.1 8.9 - 10.3 mg/dL   Total Protein 6.2 (L) 6.5 - 8.1 g/dL   Albumin 3.5 3.5 - 5.0 g/dL   AST 10 (L) 15 - 41 U/L   ALT 10 0 - 44 U/L   Alkaline Phosphatase 103 38 - 126 U/L   Total Bilirubin 0.9 0.3 - 1.2 mg/dL   GFR, Estimated 39 (L) >60 mL/min   Anion gap 13 5 - 15  CBC with Differential     Status: Abnormal   Collection Time: 02/19/22  2:35 AM  Result Value Ref Range   WBC 1.2 (LL)  4.0 - 10.5 K/uL   RBC 3.56 (L) 4.22 - 5.81 MIL/uL   Hemoglobin 9.4 (L) 13.0 - 17.0 g/dL   HCT 28.3 (L) 39.0 - 52.0 %   MCV 79.5 (L) 80.0 - 100.0 fL   MCH 26.4 26.0 - 34.0 pg   MCHC 33.2 30.0 - 36.0 g/dL   RDW 16.7 (H) 11.5 - 15.5 %   Platelets 195 150 - 400 K/uL   nRBC 0.0 0.0 - 0.2 %  Neutrophils Relative % 11 %   Neutro Abs 0.1 (LL) 1.7 - 7.7 K/uL   Lymphocytes Relative 82 %   Lymphs Abs 1.0 0.7 - 4.0 K/uL   Monocytes Relative 7 %   Monocytes Absolute 0.1 0.1 - 1.0 K/uL   Eosinophils Relative 0 %   Eosinophils Absolute 0.0 0.0 - 0.5 K/uL   Basophils Relative 0 %   Basophils Absolute 0.0 0.0 - 0.1 K/uL   Immature Granulocytes 0 %   Abs Immature Granulocytes 0.00 0.00 - 0.07 K/uL  Urinalysis, Routine w reflex microscopic Urine, Clean Catch     Status: Abnormal   Collection Time: 02/19/22  2:38 AM  Result Value Ref Range   Color, Urine YELLOW YELLOW   APPearance CLEAR CLEAR   Specific Gravity, Urine 1.015 1.005 - 1.030   pH 5.5 5.0 - 8.0   Glucose, UA >1,000 (A) NEGATIVE mg/dL   Hgb urine dipstick TRACE (A) NEGATIVE   Bilirubin Urine NEGATIVE NEGATIVE   Ketones, ur NEGATIVE NEGATIVE mg/dL   Protein, ur 30 (A) NEGATIVE mg/dL   Nitrite NEGATIVE NEGATIVE   Leukocytes,Ua NEGATIVE NEGATIVE   RBC / HPF 0-5 0 - 5 RBC/hpf   WBC, UA 0-5 0 - 5 WBC/hpf   Squamous Epithelial / LPF 0-5 0 - 5   Imaging Studies: No results found.  ED COURSE and MDM  Nursing notes, initial and subsequent vitals signs, including pulse oximetry, reviewed and interpreted by myself.  Vitals:   02/19/22 0208 02/19/22 0500  BP: (!) 145/62 (!) 149/62  Pulse: (!) 128 (!) 127  Resp: (!) 21 (!) 22  Temp: 98.9 F (37.2 C)   SpO2: 100% 100%   Medications  lactated ringers bolus 1,000 mL (0 mLs Intravenous Stopped 02/19/22 0617)  insulin aspart (novoLOG) injection 5 Units (5 Units Subcutaneous Given 02/19/22 0518)  lactated ringers bolus 1,000 mL (1,000 mLs Intravenous New Bag/Given 02/19/22 0618)   IV  hydration and subcu insulin started for dehydration and hyperglycemia.  Patient is failing to thrive and is unable to perform activities of daily living.  We will have him admitted.  It may be time to consider hospice care or skilled nursing placement.  5:29 AM Dr. Hal Hope accepts for admission to hospitalist service.   PROCEDURES  Procedures   ED DIAGNOSES     ICD-10-CM   1. Failure to thrive in adult  R62.7     2. Hyperglycemia  R73.9     3. Dehydration  E86.0     4. CLL (chronic lymphocytic leukemia) (HCC)  C91.10     5. Chemotherapy-induced neutropenia ([place])  D70.1    T45.1X5A          Tyrisha Benninger, Jenny Reichmann, MD 02/19/22 [phone]

## 2022-02-19 NOTE — ED Notes (Signed)
Pt explained delay in receiving an inpatient bed. Pt in no distress, resting comfortably.

## 2022-02-19 NOTE — Telephone Encounter (Signed)
Patient has been called and voicemail was left regarding upcoming appointments.

## 2022-02-19 NOTE — ED Triage Notes (Signed)
Pt has CLL, pt has had weakness that has been going on for a while, was admitted at [facility] last week for dehydration and malnutrition. Family reports that he has not been eating and drinking since he got home form the hospital and had several falls.

## 2022-02-19 NOTE — H&P (Addendum)
History and Physical    [name] GUR:[id number] DOB: 09-07-1941 DOA: 02/19/2022  I have briefly reviewed the patient's prior medical records in [place]  PCP: [name], MD  Patient coming from: home  Chief Complaint: Generalized weakness, recurrent falls  HPI: [name] is a 81 y.o. male with medical history significant of CLL currently getting therapy with Imbruvica, DM 2, hypertension, colorectal cancer in 2009, hyperlipidemia, who comes into the hospital with complaints of generalized weakness and recurrent falls.  He was recently hospitalized and discharged 7 days ago with a similar presentation, had poor p.o. intake, poor appetite and was admitted with failure to thrive.  He was neutropenic at that time and there was concern for infection, he was placed on antibiotics but eventually discontinued since there was a very low suspicion for that.  Since discharge, over the last week, he tells me he has been having nausea and intermittent vomiting, but no diarrhea.  He has been feeling weaker.  He is doing okay with nutritional supplements and water but is unable to eat anything since he has no appetite.  He has been having recurrent falls, no syncope but mostly sliding to the floor and unable to get up due to profound weakness.  He presented to the hospital, was found to be tachycardic, weak and was admitted.  Currently he denies any chest pain, denies any shortness of breath.  He does complain of a cough which has been chronic but nonproductive.  No abdominal pain, no diarrhea.  No fever or chills at home.  No new rashes, numbness or tingling.  ED Course: In the ED he is afebrile 98.8, breathing comfortably on room air, slightly hypertensive with a blood pressure in the 160s.  Blood work shows a sodium of 131, chloride 96, glucose 327, BUN 41 and creatinine 1.7.  Lactic acid was 3.4.  WBC is 1.2 with a neutrophil of 0.1, hemoglobin 9.4.  Platelets are normal at 195.  Urinalysis  with glucosuria otherwise fairly unremarkable.  Review of Systems: All systems reviewed, and apart from HPI, all negative  Past Medical History:  Diagnosis Date   CLL (chronic lymphocytic leukemia) ([place])    chemotherapy   Colon cancer ([place])    Colorectal cancer ([place])    COLORECTAL DX 2/09, treated surgically   Diabetes mellitus    Hypercholesterolemia    Hypertension    Pneumonia    Bilateral pneumonia    Past Surgical History:  Procedure Laterality Date   HEMICOLECTOMY     nasal polyps     S/P nasal surgery     reports that he has quit smoking. His smoking use included cigarettes. He has a 28.00 pack-year smoking history. He has never used smokeless tobacco. He reports that he does not drink alcohol and does not use drugs.  Allergies  Allergen Reactions   Hydrocodone-Acetaminophen Other (See Comments)    Loss of weight    Family History  Problem Relation Age of Onset   Ovarian cancer Mother    Breast cancer Maternal Aunt     Prior to Admission medications   Medication Sig Start Date End Date Taking? Authorizing Provider  amLODipine (NORVASC) 2.5 MG tablet Take 2.5 mg by mouth daily.   Yes [provider]  aspirin 81 MG tablet Take 81 mg by mouth every morning.    Yes [provider]  atorvastatin (LIPITOR) 40 MG tablet Take 40 mg by mouth daily. 01/10/13  Yes [provider]  famotidine (PEPCID)  40 MG tablet Take 40 mg by mouth at bedtime. 11/13/21  Yes [provider]  hydroxypropyl methylcellulose / hypromellose (ISOPTO TEARS / GONIOVISC) 2.5 % ophthalmic solution Place 1 drop into both eyes in the morning.   Yes [provider]  ibrutinib (IMBRUVICA) 420 MG tablet TAKE 1 TABLET BY MOUTH DAILY 02/11/22 02/11/23 Yes Shadad, Mathis Dad, MD  lisinopril (ZESTRIL) 20 MG tablet Take 20 mg by mouth every morning. 12/14/18  Yes [provider]  metFORMIN (GLUCOPHAGE) 1000 MG tablet Take 1,000 mg by mouth 2 (two) times daily with  a meal. 06/05/17  Yes [provider]  metoprolol tartrate (LOPRESSOR) 25 MG tablet Take 12.5 mg by mouth 2 (two) times daily.   Yes [provider]  ondansetron (ZOFRAN-ODT) 8 MG disintegrating tablet TAKE 1 TABLET BY MOUTH EVERY 8 HOURS AS NEEDED FOR NAUSEA AND VOMITING Patient taking differently: Take 8 mg by mouth every 8 (eight) hours as needed for nausea or vomiting. 02/02/22  Yes Wyatt Portela, MD  pantoprazole (PROTONIX) 40 MG tablet Take 1 tablet (40 mg total) by mouth 2 (two) times daily. Patient taking differently: Take 40 mg by mouth daily. 01/19/22  Yes Pyrtle, Lajuan Lines, MD  prochlorperazine (COMPAZINE) 10 MG tablet TAKE 1 TABLET BY MOUTH EVERY 6 HOURS AS NEEDED FOR NAUSEA OR VOMITING. 02/13/22  Yes Wyatt Portela, MD  zolpidem (AMBIEN) 10 MG tablet Take 5 mg by mouth at bedtime. 10/31/19  Yes [provider]    Physical Exam: Vitals:   02/19/22 1100 02/19/22 1130 02/19/22 1201 02/19/22 1255  BP: (!) 148/63 (!) 149/66  (!) 160/59  Pulse: (!) 135 (!) 134  (!) 134  Resp: (!) 21 17  (!) 22  Temp:   99.1 F (37.3 C) 98.8 F (37.1 C)  TempSrc:   Oral Oral  SpO2: 99% 98%  100%  Weight:    51 kg  Height:    '5\' 7"'$  (1.702 m)    Constitutional: NAD, calm, comfortable Eyes: PERRL, lids and conjunctivae normal ENMT: Mucous membranes are dry Neck: normal, supple Respiratory: clear to auscultation bilaterally, no wheezing, no crackles. Normal respiratory effort Cardiovascular: Regular rate and rhythm, no murmurs / rubs / gallops. No extremity edema.  Tachycardic Abdomen: no tenderness, no masses palpated. Bowel sounds positive.  Musculoskeletal: no clubbing / cyanosis. Normal muscle tone.  Skin: no rashes, lesions, ulcers. No induration Neurologic: Nonfocal  Labs on Admission: I have personally reviewed following labs and imaging studies  CBC: Recent Labs  Lab 02/19/22 0235  WBC 1.2*  NEUTROABS 0.1*  HGB 9.4*  HCT 28.3*  MCV 79.5*  PLT 833   Basic  Metabolic Panel: Recent Labs  Lab 02/19/22 0235  NA 131*  K 4.1  CL 96*  CO2 22  GLUCOSE 327*  BUN 41*  CREATININE 1.74*  CALCIUM 9.1   Liver Function Tests: Recent Labs  Lab 02/19/22 0235  AST 10*  ALT 10  ALKPHOS 103  BILITOT 0.9  PROT 6.2*  ALBUMIN 3.5   Coagulation Profile: No results for input(s): INR, PROTIME in the last 168 hours. BNP (last 3 results) No results for input(s): PROBNP in the last 8760 hours. CBG: Recent Labs  Lab 02/19/22 0925  GLUCAP 173*   Thyroid Function Tests: No results for input(s): TSH, T4TOTAL, FREET4, T3FREE, THYROIDAB in the last 72 hours. Urine analysis:    Component Value Date/Time   COLORURINE YELLOW 02/19/2022 [place] 02/19/2022 0238   LABSPEC 1.015  02/19/2022 0238   PHURINE 5.5 02/19/2022 0238   GLUCOSEU >1,000 (A) 02/19/2022 0238   HGBUR TRACE (A) 02/19/2022 0238   BILIRUBINUR NEGATIVE 02/19/2022 0238   KETONESUR NEGATIVE 02/19/2022 0238   PROTEINUR 30 (A) 02/19/2022 0238   UROBILINOGEN 0.2 09/17/2013 0640   NITRITE NEGATIVE 02/19/2022 0238   LEUKOCYTESUR NEGATIVE 02/19/2022 0238     Radiological Exams on Admission: DG CHEST PORT 1 VIEW  Result Date: 02/19/2022 CLINICAL DATA:  Weakness.  History of CLL. EXAM: PORTABLE CHEST 1 VIEW COMPARISON:  Chest radiographs 02/09/2022 FINDINGS: The cardiomediastinal silhouette is unchanged with normal heart size. Aortic atherosclerosis is noted. No airspace consolidation, edema, pleural effusion, or pneumothorax is identified. No acute osseous abnormality is seen. IMPRESSION: No active disease. Electronically Signed   By: Logan Bores M.D.   On: 02/19/2022 13:47    EKG: Independently reviewed.  Telemetry reviewed, sinus tachycardia  Assessment/Plan Principal Problem:   Dehydration Active Problems:   Neutropenia in patient with hx of CLL   Chronic lymphocytic leukemia (HCC)   SIRS (systemic inflammatory response syndrome) (HCC)   Colon cancer (HCC)    Protein-calorie malnutrition, severe (HCC)   Hypertension   Hypercholesterolemia   Diabetes mellitus ([place])   Hyponatremia   Stage 3b chronic kidney disease (CKD) ([place])  Principal problem Profound dehydration, failure to thrive-patient with progressive weakness, poor p.o. intake and worsening dehydration.  Has received 2 L of bolus in the ED, continue IV fluids.  His lactic acid was initially elevated at 3.4 but normalized after fluids.  He has had a significant appetite loss, start Marinol.  Liberalize diet  Active problems SIRS-likely due to profound dehydration, does not have any symptoms to suggest an infectious process.  Chest x-ray unremarkable, urinalysis does not suggest infection.  Chronic kidney disease stage IIIb-baseline creatinine around 1.4-1.6, currently 1.7.  Close to baseline.  Anticipate there is a degree of dehydration, continue IV fluids  CLL - continue home meds  Hyponatremia-due to dehydration, continue fluids  Sinus tachycardia-likely due to #1, he is also hypertensive and resume home metoprolol  Essential hypertension-hypertensive on admission, resume home metoprolol.  Hold lisinopril in the setting of his CKD, hold Norvasc.  Monitor blood pressure  Hyperlipidemia-continue statin  Anemia-likely due to underlying malignancy, no bleeding, continue to monitor hemoglobin.  Likely to drop tomorrow due to IV fluids  Leukopenia, neutropenia-due to CLL, I will discuss with oncology  Type 2 diabetes mellitus-hyperglycemic initially on BMP with sugar of 327, improved now.  Hold home metformin, placed on sliding scale  Severe protein calorie malnutrition-consult RD.  Marinol as above.  Liberalize diet.  Addendum: discussed with Dr Alen Blew. Patient has been having a history of poor appetite / weight loss earlier this year, evaluated by GI. His Imbruvica was discontinued in March due to remission of his CLL, and apparently afterwards patient continued to decline more with  loss of appetite, GI issues and resulted in these 2 hospitalizations. EGD and Colonoscopy by GI unremarkable in April 2023. He was treated for H pylori.   DVT prophylaxis: Heparin Code Status: DNR Family Communication: No family at bedside Disposition Plan: To be determined Bed Type: Stepdown Consults called: None Obs/Inp: Observation, order placed by accepting physician, likely need to move to inpatient tomorrow   Marzetta Board, MD, PhD [facility]  Contact via [email or web address]  02/19/2022, 1:52 PM

## 2022-02-20 ENCOUNTER — Inpatient Hospital Stay (HOSPITAL_COMMUNITY): Payer: Medicare Other

## 2022-02-20 DIAGNOSIS — E43 Unspecified severe protein-calorie malnutrition: Secondary | ICD-10-CM | POA: Diagnosis present

## 2022-02-20 DIAGNOSIS — Z794 Long term (current) use of insulin: Secondary | ICD-10-CM | POA: Diagnosis not present

## 2022-02-20 DIAGNOSIS — R5081 Fever presenting with conditions classified elsewhere: Secondary | ICD-10-CM | POA: Diagnosis not present

## 2022-02-20 DIAGNOSIS — R531 Weakness: Secondary | ICD-10-CM | POA: Diagnosis not present

## 2022-02-20 DIAGNOSIS — T451X5A Adverse effect of antineoplastic and immunosuppressive drugs, initial encounter: Secondary | ICD-10-CM | POA: Diagnosis not present

## 2022-02-20 DIAGNOSIS — Z85048 Personal history of other malignant neoplasm of rectum, rectosigmoid junction, and anus: Secondary | ICD-10-CM | POA: Diagnosis not present

## 2022-02-20 DIAGNOSIS — N179 Acute kidney failure, unspecified: Secondary | ICD-10-CM | POA: Diagnosis not present

## 2022-02-20 DIAGNOSIS — Z66 Do not resuscitate: Secondary | ICD-10-CM | POA: Diagnosis present

## 2022-02-20 DIAGNOSIS — R7881 Bacteremia: Secondary | ICD-10-CM | POA: Diagnosis not present

## 2022-02-20 DIAGNOSIS — B952 Enterococcus as the cause of diseases classified elsewhere: Secondary | ICD-10-CM | POA: Diagnosis not present

## 2022-02-20 DIAGNOSIS — Z4682 Encounter for fitting and adjustment of non-vascular catheter: Secondary | ICD-10-CM | POA: Diagnosis not present

## 2022-02-20 DIAGNOSIS — E8779 Other fluid overload: Secondary | ICD-10-CM | POA: Diagnosis not present

## 2022-02-20 DIAGNOSIS — Z7189 Other specified counseling: Secondary | ICD-10-CM | POA: Diagnosis not present

## 2022-02-20 DIAGNOSIS — D709 Neutropenia, unspecified: Secondary | ICD-10-CM

## 2022-02-20 DIAGNOSIS — I358 Other nonrheumatic aortic valve disorders: Secondary | ICD-10-CM | POA: Diagnosis present

## 2022-02-20 DIAGNOSIS — I129 Hypertensive chronic kidney disease with stage 1 through stage 4 chronic kidney disease, or unspecified chronic kidney disease: Secondary | ICD-10-CM | POA: Diagnosis present

## 2022-02-20 DIAGNOSIS — Z4659 Encounter for fitting and adjustment of other gastrointestinal appliance and device: Secondary | ICD-10-CM | POA: Diagnosis not present

## 2022-02-20 DIAGNOSIS — R6520 Severe sepsis without septic shock: Secondary | ICD-10-CM | POA: Diagnosis not present

## 2022-02-20 DIAGNOSIS — D701 Agranulocytosis secondary to cancer chemotherapy: Secondary | ICD-10-CM | POA: Diagnosis present

## 2022-02-20 DIAGNOSIS — X58XXXA Exposure to other specified factors, initial encounter: Secondary | ICD-10-CM | POA: Diagnosis present

## 2022-02-20 DIAGNOSIS — A419 Sepsis, unspecified organism: Secondary | ICD-10-CM | POA: Diagnosis not present

## 2022-02-20 DIAGNOSIS — D61818 Other pancytopenia: Secondary | ICD-10-CM | POA: Diagnosis present

## 2022-02-20 DIAGNOSIS — N1832 Chronic kidney disease, stage 3b: Secondary | ICD-10-CM

## 2022-02-20 DIAGNOSIS — K921 Melena: Secondary | ICD-10-CM | POA: Diagnosis not present

## 2022-02-20 DIAGNOSIS — D702 Other drug-induced agranulocytosis: Secondary | ICD-10-CM | POA: Diagnosis present

## 2022-02-20 DIAGNOSIS — D630 Anemia in neoplastic disease: Secondary | ICD-10-CM | POA: Diagnosis present

## 2022-02-20 DIAGNOSIS — E860 Dehydration: Secondary | ICD-10-CM | POA: Diagnosis present

## 2022-02-20 DIAGNOSIS — R627 Adult failure to thrive: Secondary | ICD-10-CM | POA: Diagnosis present

## 2022-02-20 DIAGNOSIS — J90 Pleural effusion, not elsewhere classified: Secondary | ICD-10-CM | POA: Diagnosis present

## 2022-02-20 DIAGNOSIS — J9811 Atelectasis: Secondary | ICD-10-CM | POA: Diagnosis not present

## 2022-02-20 DIAGNOSIS — C9112 Chronic lymphocytic leukemia of B-cell type in relapse: Secondary | ICD-10-CM | POA: Diagnosis present

## 2022-02-20 DIAGNOSIS — E1165 Type 2 diabetes mellitus with hyperglycemia: Secondary | ICD-10-CM | POA: Diagnosis present

## 2022-02-20 DIAGNOSIS — E871 Hypo-osmolality and hyponatremia: Secondary | ICD-10-CM | POA: Diagnosis present

## 2022-02-20 DIAGNOSIS — A4181 Sepsis due to Enterococcus: Secondary | ICD-10-CM | POA: Diagnosis present

## 2022-02-20 DIAGNOSIS — Z515 Encounter for palliative care: Secondary | ICD-10-CM | POA: Diagnosis not present

## 2022-02-20 DIAGNOSIS — I3139 Other pericardial effusion (noninflammatory): Secondary | ICD-10-CM | POA: Diagnosis present

## 2022-02-20 DIAGNOSIS — I700 Atherosclerosis of aorta: Secondary | ICD-10-CM | POA: Diagnosis present

## 2022-02-20 DIAGNOSIS — C9110 Chronic lymphocytic leukemia of B-cell type not having achieved remission: Secondary | ICD-10-CM | POA: Diagnosis not present

## 2022-02-20 DIAGNOSIS — D696 Thrombocytopenia, unspecified: Secondary | ICD-10-CM | POA: Diagnosis present

## 2022-02-20 DIAGNOSIS — J9600 Acute respiratory failure, unspecified whether with hypoxia or hypercapnia: Secondary | ICD-10-CM | POA: Diagnosis not present

## 2022-02-20 DIAGNOSIS — E878 Other disorders of electrolyte and fluid balance, not elsewhere classified: Secondary | ICD-10-CM | POA: Diagnosis not present

## 2022-02-20 DIAGNOSIS — C189 Malignant neoplasm of colon, unspecified: Secondary | ICD-10-CM | POA: Diagnosis not present

## 2022-02-20 LAB — COMPREHENSIVE METABOLIC PANEL
ALT: 15 U/L (ref 0–44)
AST: 18 U/L (ref 15–41)
Albumin: 2.4 g/dL — ABNORMAL LOW (ref 3.5–5.0)
Alkaline Phosphatase: 79 U/L (ref 38–126)
Anion gap: 9 (ref 5–15)
BUN: 34 mg/dL — ABNORMAL HIGH (ref 8–23)
CO2: 22 mmol/L (ref 22–32)
Calcium: 8 mg/dL — ABNORMAL LOW (ref 8.9–10.3)
Chloride: 101 mmol/L (ref 98–111)
Creatinine, Ser: 1.63 mg/dL — ABNORMAL HIGH (ref 0.61–1.24)
GFR, Estimated: 43 mL/min — ABNORMAL LOW (ref >60)
Glucose, Bld: 208 mg/dL — ABNORMAL HIGH (ref 70–99)
Potassium: 3.7 mmol/L (ref 3.5–5.1)
Sodium: 132 mmol/L — ABNORMAL LOW (ref 135–145)
Total Bilirubin: 1.1 mg/dL (ref 0.3–1.2)
Total Protein: 4.9 g/dL — ABNORMAL LOW (ref 6.5–8.1)

## 2022-02-20 LAB — CBC
HCT: 26.1 % — ABNORMAL LOW (ref 39.0–52.0)
Hemoglobin: 8.6 g/dL — ABNORMAL LOW (ref 13.0–17.0)
MCH: 27 pg (ref 26.0–34.0)
MCHC: 33 g/dL (ref 30.0–36.0)
MCV: 81.8 fL (ref 80.0–100.0)
Platelets: 148 10*3/uL — ABNORMAL LOW (ref 150–400)
RBC: 3.19 MIL/uL — ABNORMAL LOW (ref 4.22–5.81)
RDW: 17 % — ABNORMAL HIGH (ref 11.5–15.5)
WBC: 0.9 10*3/uL — CL (ref 4.0–10.5)
nRBC: 0 % (ref 0.0–0.2)

## 2022-02-20 LAB — GLUCOSE, CAPILLARY
Glucose-Capillary: 177 mg/dL — ABNORMAL HIGH (ref 70–99)
Glucose-Capillary: 184 mg/dL — ABNORMAL HIGH (ref 70–99)
Glucose-Capillary: 190 mg/dL — ABNORMAL HIGH (ref 70–99)
Glucose-Capillary: 191 mg/dL — ABNORMAL HIGH (ref 70–99)
Glucose-Capillary: 213 mg/dL — ABNORMAL HIGH (ref 70–99)
Glucose-Capillary: 216 mg/dL — ABNORMAL HIGH (ref 70–99)

## 2022-02-20 MED ORDER — PANTOPRAZOLE SODIUM 40 MG PO TBEC: 40.0000 mg | DELAYED_RELEASE_TABLET | Freq: Two times a day (BID) | ORAL | Status: DC

## 2022-02-20 MED ORDER — SODIUM CHLORIDE 0.9 % IV SOLN: INTRAVENOUS | Status: DC | PRN

## 2022-02-20 MED ORDER — ADULT MULTIVITAMIN W/MINERALS CH
1.0000 | ORAL_TABLET | Freq: Every day | ORAL | Status: DC
  Administered 2022-02-21 – 2022-02-28 (×8): 1
  Filled 2022-02-20 (×8): qty 1

## 2022-02-20 MED ORDER — METOPROLOL TARTRATE 25 MG PO TABS
12.5000 mg | ORAL_TABLET | Freq: Two times a day (BID) | ORAL | Status: DC
  Administered 2022-02-20: 12.5 mg
  Filled 2022-02-20: qty 1

## 2022-02-20 MED ORDER — THIAMINE HCL 100 MG PO TABS
100.0000 mg | ORAL_TABLET | Freq: Every day | ORAL | Status: DC
  Administered 2022-02-20 – 2022-02-28 (×9): 100 mg
  Filled 2022-02-20 (×9): qty 1

## 2022-02-20 MED ORDER — ACETAMINOPHEN 325 MG PO TABS: 650.0000 mg | ORAL_TABLET | Freq: Four times a day (QID) | ORAL | Status: DC | PRN

## 2022-02-20 MED ORDER — ADULT MULTIVITAMIN W/MINERALS CH
1.0000 | ORAL_TABLET | Freq: Every day | ORAL | Status: DC
  Administered 2022-02-20: 1 via ORAL
  Filled 2022-02-20: qty 1

## 2022-02-20 MED ORDER — ATORVASTATIN CALCIUM 40 MG PO TABS
40.0000 mg | ORAL_TABLET | Freq: Every day | ORAL | Status: DC
  Administered 2022-02-21 – 2022-02-28 (×8): 40 mg
  Filled 2022-02-20 (×8): qty 1

## 2022-02-20 MED ORDER — ONDANSETRON HCL 4 MG/2ML IJ SOLN
4.0000 mg | Freq: Three times a day (TID) | INTRAMUSCULAR | Status: DC
  Administered 2022-02-20: 4 mg via INTRAVENOUS
  Filled 2022-02-20: qty 2

## 2022-02-20 MED ORDER — ONDANSETRON HCL 4 MG/2ML IJ SOLN
4.0000 mg | Freq: Three times a day (TID) | INTRAMUSCULAR | Status: DC
  Administered 2022-02-20 – 2022-03-01 (×13): 4 mg via INTRAVENOUS
  Filled 2022-02-20 (×14): qty 2

## 2022-02-20 MED ORDER — OSMOLITE 1.5 CAL PO LIQD
1000.0000 mL | ORAL | Status: DC
  Administered 2022-02-20 – 2022-02-27 (×8): 1000 mL
  Filled 2022-02-20 (×17): qty 1000

## 2022-02-20 MED ORDER — ENSURE ENLIVE PO LIQD: 237.0000 mL | Freq: Three times a day (TID) | ORAL | Status: DC

## 2022-02-20 MED ORDER — MIRTAZAPINE 15 MG PO TABS
7.5000 mg | ORAL_TABLET | Freq: Every day | ORAL | Status: DC
  Administered 2022-02-20 – 2022-03-02 (×9): 7.5 mg
  Filled 2022-02-20 (×9): qty 1

## 2022-02-20 MED ORDER — SODIUM CHLORIDE 0.9 % IV SOLN
2.0000 g | INTRAVENOUS | Status: DC
  Administered 2022-02-20: 2 g via INTRAVENOUS
  Filled 2022-02-20 (×2): qty 12.5

## 2022-02-20 MED ORDER — GUAIFENESIN-DM 100-10 MG/5ML PO SYRP
5.0000 mL | ORAL_SOLUTION | ORAL | Status: DC | PRN
Start: 2022-02-20 — End: 2022-02-23
  Administered 2022-02-22: 5 mL
  Filled 2022-02-20: qty 10

## 2022-02-20 MED ORDER — ASPIRIN 81 MG PO CHEW
81.0000 mg | CHEWABLE_TABLET | Freq: Every day | ORAL | Status: DC
  Administered 2022-02-21 – 2022-02-28 (×8): 81 mg
  Filled 2022-02-20 (×8): qty 1

## 2022-02-20 MED ORDER — ONDANSETRON HCL 4 MG PO TABS
4.0000 mg | ORAL_TABLET | Freq: Three times a day (TID) | ORAL | Status: DC
Start: 2022-02-20 — End: 2022-03-03
  Administered 2022-02-21 – 2022-03-02 (×14): 4 mg
  Filled 2022-02-20 (×14): qty 1

## 2022-02-20 MED ORDER — VITAL HIGH PROTEIN PO LIQD: 1000.0000 mL | ORAL | Status: DC

## 2022-02-20 MED ORDER — ONDANSETRON HCL 4 MG PO TABS: 4.0000 mg | ORAL_TABLET | Freq: Three times a day (TID) | ORAL | Status: DC

## 2022-02-20 MED ORDER — VANCOMYCIN HCL 1750 MG/350ML IV SOLN
1750.0000 mg | INTRAVENOUS | Status: DC
  Filled 2022-02-20: qty 350

## 2022-02-20 MED ORDER — PANTOPRAZOLE 2 MG/ML SUSPENSION
40.0000 mg | Freq: Two times a day (BID) | ORAL | Status: DC
  Administered 2022-02-20 – 2022-03-02 (×17): 40 mg
  Filled 2022-02-20 (×22): qty 20

## 2022-02-20 MED ORDER — PROSOURCE TF PO LIQD
45.0000 mL | Freq: Two times a day (BID) | ORAL | Status: DC
  Administered 2022-02-20 – 2022-02-28 (×16): 45 mL
  Filled 2022-02-20 (×21): qty 45

## 2022-02-20 MED ORDER — FREE WATER
100.0000 mL | Status: DC
  Administered 2022-02-20 – 2022-02-25 (×29): 100 mL

## 2022-02-20 MED ORDER — VANCOMYCIN HCL 1250 MG/250ML IV SOLN
1250.0000 mg | INTRAVENOUS | Status: DC
  Administered 2022-02-20: 1250 mg via INTRAVENOUS
  Filled 2022-02-20: qty 250

## 2022-02-20 NOTE — Progress Notes (Signed)
Pharmacy Antibiotic Note  [name] is a 81 y.o. male admitted on 02/19/2022 with febrile neutropenia.  Pharmacy has been consulted for vancomycin and cefepime dosing.  Plan: Cefepime 2g IV q24h Vancomycin '1250mg'$  IV q48h for estimated AUC 517 using SCr 1.63 Check vancomycin levels at steady state, goal AUC 400-550 Follow up renal function & cultures  Height: '5\' 7"'$  (170.2 cm) Weight: 51 kg (112 lb 8 oz) IBW/kg (Calculated) : 66.1  Temp (24hrs), Avg:99.9 F (37.7 C), Min:98.8 F (37.1 C), Max:102.3 F (39.1 C)  Recent Labs  Lab 02/19/22 0235 02/19/22 0927 02/20/22 0310  WBC 1.2*  --  0.9*  CREATININE 1.74*  --  1.63*  LATICACIDVEN 3.4* 1.8  --     Estimated Creatinine Clearance: 26.5 mL/min (A) (by C-G formula based on SCr of 1.63 mg/dL (H)).    Allergies  Allergen Reactions   Hydrocodone-Acetaminophen Other (See Comments)    Loss of weight    Antimicrobials this admission: 5/26 Vancomycin >> 5/26 Cefepime >>  Dose adjustments this admission:  Microbiology results: 5/26 BCx:  Thank you for allowing pharmacy to be a part of this patient's care.  Peggyann Juba, PharmD, BCPS Pharmacy: [phone] 02/20/2022 7:04 AM

## 2022-02-20 NOTE — Progress Notes (Signed)
NUTRITION NOTE  Patient seen for full initial assessment earlier this afternoon and note entered at that time. He is s/p MBS and SLP shares that patient is high risk for aspiration and recommendation is for NPO. Discussion with care team--plan for bedside placement of small bore NGT and RD to initiate TF.  Patient at high risk for refeeding. Will initiate 100 mg thiamine via NGT/day.  Will order Osmolite 1.5 @ 25 ml/hr to advance by 10 ml every 24 hours to reach goal rate of 55 ml/hr with 45 ml Prosource TF BID and 100 ml free water every 4 hours.  At goal rate, this regimen will provide 2060 kcal, 105 grams protein, and 1606 ml free water.   Estimated Nutritional Needs:  Kcal:  2000-2200 kcal Protein:  100-115 grams Fluid:  >/= 2.2 L/day    Jarome Matin, MS, RD, LDN Registered Dietitian II Inpatient Clinical Nutrition RD pager # and on-call/weekend pager # available in [facility]

## 2022-02-20 NOTE — Progress Notes (Addendum)
Initial Nutrition Assessment  DOCUMENTATION CODES:   Severe malnutrition in context of chronic illness, Underweight  INTERVENTION:  - will increase Ensure Plus High Protein and will increase to TID, each supplement provides 350 kcal and 20 grams of protein.  - will order 1 tablet multivitamin with minerals/day.  - will check serum vitamin D and serum zinc d/t recurrence of CLL.  - encouraged patient to have wife and/or wife's sister bring in foods and beverages he enjoys and any seasonings he enjoys adding to foods.   NUTRITION DIAGNOSIS:   Severe Malnutrition related to chronic illness, cancer and cancer related treatments as evidenced by moderate fat depletion, severe muscle depletion, percent weight loss.  GOAL:   Patient will meet greater than or equal to 90% of their needs  MONITOR:   PO intake, Supplement acceptance, Labs, Weight trends  REASON FOR ASSESSMENT:   Consult Assessment of nutrition requirement/status  ASSESSMENT:   81 year old male with medical history of CLL initially diagnosed in 2014, s/p chemo and currently on Imbruvica, type 2 DM, HTN, colorectal cancer in 2009, and hypercholesterolemia. He presented to the ED due to progressive generalized weakness. He has been experiencing weight loss and poor appetite for several months. He was seen by GI outpatient in 10/2021 and workup showed no significant findings. He was taken off Imbruvica in 12/2021 as it was thought to be the cause but he continued to decline. He was admitted 2 weeks ago and returned to the ED on 5/25. He was restarted on Imbruvica after recent discharge. Repeat CT scan of the abdomen and pelvis on 5/25 shows recurrence of his CLL. Oncology consulted. He was admitted for dehydration.  Patient laying in bed with no visitors present at the time of RD visit. He reports being married and that his wife has a sister in the area.   Patient was seen by another RD on 02/10/22. Nutrition-impacting symptoms  ongoing since that time: very poor appetite, bland taste. He shares that he had raisin bran with milk for breakfast this AM. He enjoys Ensure and is agreeable to order for TID. Encouraged using Ensure to take PO medications.   Encouraged patient to have his wife and/or her sister bring in any food or beverages he enjoys and would be able to consume a larger quantity of and to bring in any seasonings (such as crushed red pepper, hot sauce, dried herbs) that he likes in order to add more flavor to foods.  Marinol was ordered BID starting yesterday evening. Patient has not had this medication prior to yesterday evening. Shared with him it may take a few days to notice any change.   Patient confirms feeling weak and sometimes dizzy or lightheaded at home. He has a cane and walker available to him at home to aid with ambulation.  Weight yesterday was 112 lb and weight on 01/12/22 was 121 lb. This indicates 9 lb weight loss (7% body weight) in the past 1 month.     Labs reviewed; CBGs: 184 mg/dl, Na: 132 mmol/l, BUN: 34 mg/dl, creatinine: 1.63 mg/dl, Ca: 8 mg/dl, GFR: 43 ml/min.  Medications reviewed; 5 mg marinol BID started 5/25 evening, sliding scale novolog, 40 mg oral protonix BID.  IVF; LR @ 125 ml/hr.    NUTRITION - FOCUSED PHYSICAL EXAM:  Flowsheet Row Most Recent Value  Orbital Region Moderate depletion  Upper Arm Region Severe depletion  Thoracic and Lumbar Region Moderate depletion  Buccal Region Moderate depletion  Temple Region Severe depletion  Clavicle  Bone Region Severe depletion  Clavicle and Acromion Bone Region Severe depletion  Scapular Bone Region Severe depletion  Dorsal Hand Severe depletion  Patellar Region Severe depletion  Anterior Thigh Region Severe depletion  Posterior Calf Region Severe depletion  Edema (RD Assessment) Mild  [BLE, around ankle area]  Hair Reviewed  Eyes Reviewed  Mouth Reviewed  Skin Reviewed  Nails Reviewed       Diet Order:   Diet  Order             Diet regular Room service appropriate? Yes; Fluid consistency: Thin  Diet effective now                   EDUCATION NEEDS:   Education needs have been addressed  Skin:  Skin Assessment: Reviewed RN Assessment  Last BM:  5/26 (type 3 x1, medium amount)  Height:   Ht Readings from Last 1 Encounters:  02/19/22 '5\' 7"'$  (1.702 m)    Weight:   Wt Readings from Last 1 Encounters:  02/19/22 51 kg     BMI:  Body mass index is 17.62 kg/m.  Estimated Nutritional Needs:  Kcal:  2000-2200 kcal Protein:  100-115 grams Fluid:  >/= 2.2 L/day     Jarome Matin, MS, RD, LDN Registered Dietitian II Inpatient Clinical Nutrition RD pager # and on-call/weekend pager # available in [facility]

## 2022-02-20 NOTE — Progress Notes (Signed)
PHARMACY NOTE:  ANTIMICROBIAL RENAL DOSAGE ADJUSTMENT  Current antimicrobial regimen includes a mismatch between antimicrobial dosage and estimated renal function.  As per policy approved by the Pharmacy & Therapeutics and Medical Executive Committees, the antimicrobial dosage will be adjusted accordingly.  Current antimicrobial dosage:  Cefepime 2gm IV q8h  Indication: febrile neutropenia  Renal Function:  Estimated Creatinine Clearance: 26.5 mL/min (A) (by C-G formula based on SCr of 1.63 mg/dL (H)). '[]'$      On intermittent HD, scheduled: '[]'$      On CRRT    Antimicrobial dosage has been changed to:  Cefepime 2gm IV q24h  Additional comments:   Thank you for allowing pharmacy to be a part of this patient's care.  Netta Cedars, [facility] 02/20/2022 6:46 AM

## 2022-02-20 NOTE — Progress Notes (Signed)
PROGRESS NOTE  [name] UKG:[id number] DOB: 08-17-1941 DOA: 02/19/2022 PCP: [name], MD   LOS: 0 days   Brief Narrative / Interim history: 81 year old male with history of CLL initially diagnosed in 2014, status postchemotherapy and currently on Imbruvica, DM 2, HTN, colorectal cancer 2009, who came into the hospital with progressive and generalized weakness.  Patient has been experiencing some weight loss and poor appetite for the past several months, was seen by GI in February as an outpatient and underwent work-up without any significant major findings.  He was taken off Imbruvica in April as it was believed that it may be related to that, however he continued to decline.  He was hospitalized just 2 weeks ago and then again on 5/25 with failure to thrive.  When he left the hospital last time he was placed back on Imbruvica.  Repeat CT scan of the abdomen and pelvis on 5/25 shows recurrence of his CLL.  Oncology consulted  Subjective / 24h Interval events: He says he is doing well this morning.  Denies any chest pain, no vomiting this morning.  Complains of persistent poor appetite.  Assesement and Plan: Principal Problem:   Dehydration Active Problems:   Neutropenia in patient with hx of CLL   Chronic lymphocytic leukemia (HCC)   SIRS (systemic inflammatory response syndrome) (HCC)   Colon cancer (HCC)   Protein-calorie malnutrition, severe (HCC)   Hypertension   Hypercholesterolemia   Diabetes mellitus ([place])   Hyponatremia   Stage 3b chronic kidney disease (CKD) ([place])   Neutropenic fever ([place])  Principal problem Neutropenic fever-patient was afebrile on admission but during his first night in the hospital developed a fever of 102.3.  There is no apparent source, fever may be related very well to his underlying malignancy.  Cultures obtained, for now keep on empiric antibiotics with vancomycin and cefepime.  He has been having a worsening cough however his chest x-ray was  clear.  He is receiving Granix  Active problems Profound dehydration, failure to thrive-patient with progressive weakness, poor p.o. intake and worsening dehydration.  Continue IV fluids.  Started on Marinol.  Liberalize diet, RD consulted   Chronic kidney disease stage IIIb-baseline creatinine around 1.4-1.6, improving with IV fluids   CLL -initially believed to be in remission but CT scan done on 5/25 shows recurrent disease.  Oncology consulted and following   Hyponatremia-due to dehydration, improving with fluids   Sinus tachycardia-likely due to dehydration, but also active malignancy   Essential hypertension-hypertensive on admission, resume home metoprolol.  Hold lisinopril in the setting of his CKD, hold Norvasc.  Blood pressure is acceptable this morning   Hyperlipidemia-continue statin  Anemia-likely due to underlying malignancy, no bleeding, continue to monitor hemoglobin.  Dropping a little bit this morning due to IV fluids  Leukopenia, neutropenia-due to recurrent malignancy.  Receiving Granix.  Antibiotics for neutropenic fever   Type 2 diabetes mellitus-hyperglycemic initially on BMP with sugar of 327, improved now.  Continue sliding scale   Severe protein calorie malnutrition-consult RD.  Marinol as above.  Liberalize diet.    Scheduled Meds:  aspirin EC  81 mg Oral q morning   atorvastatin  40 mg Oral Daily   Chlorhexidine Gluconate Cloth  6 each Topical Daily   dronabinol  5 mg Oral BID AC   famotidine  40 mg Oral QHS   feeding supplement  237 mL Oral BID BM   heparin  5,000 Units Subcutaneous Q8H   insulin aspart  0-5 Units Subcutaneous  QHS   insulin aspart  0-9 Units Subcutaneous TID WC   mouth rinse  15 mL Mouth Rinse BID   metoprolol tartrate  12.5 mg Oral BID   pantoprazole  40 mg Oral Daily   Tbo-filgastrim (GRANIX) SQ  300 mcg Subcutaneous q1800   Continuous Infusions:  sodium chloride 10 mL/hr at 02/20/22 0810   ceFEPime (MAXIPIME) IV 2 g (02/20/22  4193)   lactated ringers 125 mL/hr at 02/20/22 0809   vancomycin 1,250 mg (02/20/22 0854)   PRN Meds:.sodium chloride, acetaminophen, guaiFENesin-dextromethorphan, labetalol, ondansetron **OR** ondansetron (ZOFRAN) IV, prochlorperazine  Diet Orders (From admission, onward)     Start     Ordered   02/19/22 1335  Diet regular Room service appropriate? Yes; Fluid consistency: Thin  Diet effective now       Question Answer Comment  Room service appropriate? Yes   Fluid consistency: Thin      02/19/22 1335            DVT prophylaxis: heparin injection 5,000 Units Start: 02/19/22 1430   Lab Results  Component Value Date   PLT 148 (L) 02/20/2022      Code Status: DNR  Family Communication: no family at bedside   Status is: Observation  The patient will require care spanning > 2 midnights and should be moved to inpatient because: See A/P   Level of care: Stepdown  Consultants:  Oncology  Procedures:  none  Antimicrobials Vancomycin 5/26 >> Cefepime 5/26 >>   Objective: Vitals:   02/20/22 0400 02/20/22 0500 02/20/22 0800 02/20/22 0811  BP: (!) 154/59 (!) 131/53 (!) 148/58   Pulse: (!) 114 (!) 110 (!) 115   Resp: '18 18 19   '$ Temp:    98.8 F (37.1 C)  TempSrc:    Oral  SpO2: 99% 99% 98%   Weight:      Height:        Intake/Output Summary (Last 24 hours) at 02/20/2022 1006 Last data filed at 02/20/2022 7902 Gross per 24 hour  Intake 1789.27 ml  Output 1000 ml  Net 789.27 ml   Wt Readings from Last 3 Encounters:  02/19/22 51 kg  02/09/22 52.8 kg  01/12/22 54.9 kg    Examination:  Constitutional: NAD Eyes: no scleral icterus ENMT: Mucous membranes are moist.  Neck: normal, supple Respiratory: Faint bibasilar rhonchi, no wheezing Cardiovascular: Regular rate and rhythm, no murmurs / rubs / gallops. No LE edema.  Tachycardic Abdomen: non distended, no tenderness. Bowel sounds positive.  Musculoskeletal: no clubbing / cyanosis.  Skin: no  rashes Neurologic: No focal deficits   Data Reviewed: I have independently reviewed following labs and imaging studies   CBC Recent Labs  Lab 02/19/22 0235 02/20/22 0310  WBC 1.2* 0.9*  HGB 9.4* 8.6*  HCT 28.3* 26.1*  PLT 195 148*  MCV 79.5* 81.8  MCH 26.4 27.0  MCHC 33.2 33.0  RDW 16.7* 17.0*  LYMPHSABS 1.0  --   MONOABS 0.1  --   EOSABS 0.0  --   BASOSABS 0.0  --     Recent Labs  Lab 02/19/22 0235 02/19/22 0927 02/20/22 0310  NA 131*  --  132*  K 4.1  --  3.7  CL 96*  --  101  CO2 22  --  22  GLUCOSE 327*  --  208*  BUN 41*  --  34*  CREATININE 1.74*  --  1.63*  CALCIUM 9.1  --  8.0*  AST 10*  --  18  ALT 10  --  15  ALKPHOS 103  --  79  BILITOT 0.9  --  1.1  ALBUMIN 3.5  --  2.4*  LATICACIDVEN 3.4* 1.8  --     ------------------------------------------------------------------------------------------------------------------ No results for input(s): CHOL, HDL, LDLCALC, TRIG, CHOLHDL, LDLDIRECT in the last 72 hours.  Lab Results  Component Value Date   HGBA1C 6.9 (H) 02/09/2022   ------------------------------------------------------------------------------------------------------------------ No results for input(s): TSH, T4TOTAL, T3FREE, THYROIDAB in the last 72 hours.  Invalid input(s): FREET3  Cardiac Enzymes No results for input(s): CKMB, TROPONINI, MYOGLOBIN in the last 168 hours.  Invalid input(s): CK ------------------------------------------------------------------------------------------------------------------ No results found for: BNP  CBG: Recent Labs  Lab 02/19/22 0925 02/19/22 1641 02/19/22 2113 02/20/22 0748  GLUCAP 173* 256* 156* 184*    No results found for this or any previous visit (from the past 240 hour(s)).   Radiology Studies: CT ABDOMEN PELVIS WO CONTRAST  Result Date: 02/19/2022 CLINICAL DATA:  CLL. History of colon cancer. Metastatic disease evaluation. Cough, nausea/vomiting. EXAM: CT CHEST, ABDOMEN AND PELVIS  WITHOUT CONTRAST TECHNIQUE: Multidetector CT imaging of the chest, abdomen and pelvis was performed following the standard protocol without IV contrast. RADIATION DOSE REDUCTION: This exam was performed according to the departmental dose-optimization program which includes automated exposure control, adjustment of the mA and/or kV according to patient size and/or use of iterative reconstruction technique. COMPARISON:  CTA chest dated 10/22/2020. CT abdomen/pelvis dated 02/28/2020. FINDINGS: CT CHEST FINDINGS Cardiovascular: The heart is normal in size. Small pericardial effusion. No evidence of thoracic aortic aneurysm. Atherosclerotic calcifications of the aortic arch. Mild three-vessel coronary atherosclerosis. Mediastinum/Nodes: Mediastinal lymphadenopathy, poorly evaluated due to lack of interest contrast administration, noting a dominant 15 mm short axis subcarinal node (series 3/image 29). This previously measured 11 mm. Visualized thyroid is unremarkable. Lungs/Pleura: Small bilateral pleural effusions, right greater than left. Associated patchy bilateral lower lobe opacities, favoring atelectasis. No frank interstitial edema. Evaluation lung parenchyma is constrained by respiratory motion. Within that constraint, there are no suspicious pulmonary nodules. No pneumothorax. Musculoskeletal: No focal osseous lesions. CT ABDOMEN PELVIS FINDINGS Hepatobiliary: Unenhanced liver is notable for scattered hepatic cysts measuring up to 14 mm (series 3/image 1), benign. Gallbladder is unremarkable. No intrahepatic or extrahepatic ductal dilatation. Pancreas: Within normal limits. Spleen: Within normal limits. Adrenals/Urinary Tract: Adrenal glands are within normal limits. Bilateral renal cysts, measuring up to 2.2 cm in the right upper kidney (series 3/image 60). No hydronephrosis. Bladder is within normal limits. Stomach/Bowel: Stomach is within normal limits. No evidence of bowel obstruction. Appendix is not  discretely visualized. Status post left hemicolectomy with suture line in the lower pelvis (series 3/image 105). Vascular/Lymphatic: No evidence of abdominal aortic aneurysm. Atherosclerotic calcifications of the abdominal aorta and branch vessels. Bulky upper abdominal lymphadenopathy, poorly evaluated on unenhanced CT but new from recent CTA, including a dominant 3.9 cm node in the porta hepatis (series 3/image 58). Additional 2.4 cm short axis node in the splenic hilum (series 3/image 9), new. Para-aortic lymphadenopathy, including a dominant 16 mm short axis node on the left (series 3/image 65), previously 12 mm. Reproductive: Prostate is notable for dystrophic calcifications. Other: Small volume abdominopelvic ascites. Musculoskeletal: Visualized osseous structures are within normal limits. IMPRESSION: Limited evaluation due to lack of intravenous contrast administration. Bulky upper abdominal lymphadenopathy, new. Progressive mediastinal and retroperitoneal lymphadenopathy. These are poorly evaluated but favor progressive lymphoma. Status post left hemicolectomy. No findings specific for recurrent or metastatic disease. Small bilateral pleural effusions, right greater than left. Small  volume abdominopelvic ascites. Electronically Signed   By: Julian Hy M.D.   On: 02/19/2022 21:14   CT CHEST WO CONTRAST  Result Date: 02/19/2022 CLINICAL DATA:  CLL. History of colon cancer. Metastatic disease evaluation. Cough, nausea/vomiting. EXAM: CT CHEST, ABDOMEN AND PELVIS WITHOUT CONTRAST TECHNIQUE: Multidetector CT imaging of the chest, abdomen and pelvis was performed following the standard protocol without IV contrast. RADIATION DOSE REDUCTION: This exam was performed according to the departmental dose-optimization program which includes automated exposure control, adjustment of the mA and/or kV according to patient size and/or use of iterative reconstruction technique. COMPARISON:  CTA chest dated  10/22/2020. CT abdomen/pelvis dated 02/28/2020. FINDINGS: CT CHEST FINDINGS Cardiovascular: The heart is normal in size. Small pericardial effusion. No evidence of thoracic aortic aneurysm. Atherosclerotic calcifications of the aortic arch. Mild three-vessel coronary atherosclerosis. Mediastinum/Nodes: Mediastinal lymphadenopathy, poorly evaluated due to lack of interest contrast administration, noting a dominant 15 mm short axis subcarinal node (series 3/image 29). This previously measured 11 mm. Visualized thyroid is unremarkable. Lungs/Pleura: Small bilateral pleural effusions, right greater than left. Associated patchy bilateral lower lobe opacities, favoring atelectasis. No frank interstitial edema. Evaluation lung parenchyma is constrained by respiratory motion. Within that constraint, there are no suspicious pulmonary nodules. No pneumothorax. Musculoskeletal: No focal osseous lesions. CT ABDOMEN PELVIS FINDINGS Hepatobiliary: Unenhanced liver is notable for scattered hepatic cysts measuring up to 14 mm (series 3/image 1), benign. Gallbladder is unremarkable. No intrahepatic or extrahepatic ductal dilatation. Pancreas: Within normal limits. Spleen: Within normal limits. Adrenals/Urinary Tract: Adrenal glands are within normal limits. Bilateral renal cysts, measuring up to 2.2 cm in the right upper kidney (series 3/image 60). No hydronephrosis. Bladder is within normal limits. Stomach/Bowel: Stomach is within normal limits. No evidence of bowel obstruction. Appendix is not discretely visualized. Status post left hemicolectomy with suture line in the lower pelvis (series 3/image 105). Vascular/Lymphatic: No evidence of abdominal aortic aneurysm. Atherosclerotic calcifications of the abdominal aorta and branch vessels. Bulky upper abdominal lymphadenopathy, poorly evaluated on unenhanced CT but new from recent CTA, including a dominant 3.9 cm node in the porta hepatis (series 3/image 58). Additional 2.4 cm short  axis node in the splenic hilum (series 3/image 9), new. Para-aortic lymphadenopathy, including a dominant 16 mm short axis node on the left (series 3/image 65), previously 12 mm. Reproductive: Prostate is notable for dystrophic calcifications. Other: Small volume abdominopelvic ascites. Musculoskeletal: Visualized osseous structures are within normal limits. IMPRESSION: Limited evaluation due to lack of intravenous contrast administration. Bulky upper abdominal lymphadenopathy, new. Progressive mediastinal and retroperitoneal lymphadenopathy. These are poorly evaluated but favor progressive lymphoma. Status post left hemicolectomy. No findings specific for recurrent or metastatic disease. Small bilateral pleural effusions, right greater than left. Small volume abdominopelvic ascites. Electronically Signed   By: Julian Hy M.D.   On: 02/19/2022 21:14   DG CHEST PORT 1 VIEW  Result Date: 02/19/2022 CLINICAL DATA:  Weakness.  History of CLL. EXAM: PORTABLE CHEST 1 VIEW COMPARISON:  Chest radiographs 02/09/2022 FINDINGS: The cardiomediastinal silhouette is unchanged with normal heart size. Aortic atherosclerosis is noted. No airspace consolidation, edema, pleural effusion, or pneumothorax is identified. No acute osseous abnormality is seen. IMPRESSION: No active disease. Electronically Signed   By: Logan Bores M.D.   On: 02/19/2022 13:47     Marzetta Board, MD, PhD [facility]  Between 7 am - 7 pm I am available, please contact me via Amion (for emergencies) or Securechat (non urgent messages)  Between 7 pm - 7 am I  am not available, please contact night coverage MD/APP via Amion

## 2022-02-20 NOTE — Progress Notes (Signed)
IP PROGRESS NOTE  Subjective:   [name] is known to me with history of recurrent CLL has been on Imbruvica with complete response and now developing pancytopenia and lymphadenopathy.  Patient hospitalized for failure to thrive and neutropenia.  He also developed a fever overnight.  Clinically, he feels reasonably fair at this time with occasional cough.  He continues to have issues with eating.  Objective:  Vital signs in last 24 hours: Temp:  [98.8 F (37.1 C)-102.3 F (39.1 C)] 99.3 F (37.4 C) (05/25 2354) Pulse Rate:  [110-143] 110 (05/26 0500) Resp:  [16-24] 18 (05/26 0500) BP: (128-186)/(53-85) 131/53 (05/26 0500) SpO2:  [95 %-100 %] 99 % (05/26 0500) Weight:  [112 lb 8 oz (51 kg)] 112 lb 8 oz (51 kg) (05/25 1255) Weight change:  Last BM Date : 02/18/22  Intake/Output from previous day: 05/25 0701 - 05/26 0700 In: 2790 [P.O.:400; I.V.:1389.3; IV Piggyback:1000.7] Out: 1000 [Urine:1000]   General appearance: Comfortable appearing without any discomfort Head: Normocephalic without any trauma Oropharynx: Mucous membranes are moist and pink without any thrush or ulcers. Eyes: Pupils are equal and round reactive to light. Lymph nodes: No cervical, supraclavicular, inguinal or axillary lymphadenopathy.   Heart:regular rate and rhythm.  S1 and S2 without leg edema. Lung: Clear without any rhonchi or wheezes.  No dullness to percussion. Abdomin: Soft, nontender, nondistended with good bowel sounds.  No hepatosplenomegaly. Musculoskeletal: No joint deformity or effusion.  Full range of motion noted. Neurological: No deficits noted on motor, sensory and deep tendon reflex exam. Skin: No petechial rash or dryness.  Appeared moist.     Lab Results: Recent Labs    02/19/22 0235 02/20/22 0310  WBC 1.2* 0.9*  HGB 9.4* 8.6*  HCT 28.3* 26.1*  PLT 195 148*    BMET Recent Labs    02/19/22 0235 02/20/22 0310  NA 131* 132*  K 4.1 3.7  CL 96* 101  CO2 22 22  GLUCOSE  327* 208*  BUN 41* 34*  CREATININE 1.74* 1.63*  CALCIUM 9.1 8.0*    Studies/Results: CT ABDOMEN PELVIS WO CONTRAST  Result Date: 02/19/2022 CLINICAL DATA:  CLL. History of colon cancer. Metastatic disease evaluation. Cough, nausea/vomiting. EXAM: CT CHEST, ABDOMEN AND PELVIS WITHOUT CONTRAST TECHNIQUE: Multidetector CT imaging of the chest, abdomen and pelvis was performed following the standard protocol without IV contrast. RADIATION DOSE REDUCTION: This exam was performed according to the departmental dose-optimization program which includes automated exposure control, adjustment of the mA and/or kV according to patient size and/or use of iterative reconstruction technique. COMPARISON:  CTA chest dated 10/22/2020. CT abdomen/pelvis dated 02/28/2020. FINDINGS: CT CHEST FINDINGS Cardiovascular: The heart is normal in size. Small pericardial effusion. No evidence of thoracic aortic aneurysm. Atherosclerotic calcifications of the aortic arch. Mild three-vessel coronary atherosclerosis. Mediastinum/Nodes: Mediastinal lymphadenopathy, poorly evaluated due to lack of interest contrast administration, noting a dominant 15 mm short axis subcarinal node (series 3/image 29). This previously measured 11 mm. Visualized thyroid is unremarkable. Lungs/Pleura: Small bilateral pleural effusions, right greater than left. Associated patchy bilateral lower lobe opacities, favoring atelectasis. No frank interstitial edema. Evaluation lung parenchyma is constrained by respiratory motion. Within that constraint, there are no suspicious pulmonary nodules. No pneumothorax. Musculoskeletal: No focal osseous lesions. CT ABDOMEN PELVIS FINDINGS Hepatobiliary: Unenhanced liver is notable for scattered hepatic cysts measuring up to 14 mm (series 3/image 1), benign. Gallbladder is unremarkable. No intrahepatic or extrahepatic ductal dilatation. Pancreas: Within normal limits. Spleen: Within normal limits. Adrenals/Urinary Tract:  Adrenal glands  are within normal limits. Bilateral renal cysts, measuring up to 2.2 cm in the right upper kidney (series 3/image 60). No hydronephrosis. Bladder is within normal limits. Stomach/Bowel: Stomach is within normal limits. No evidence of bowel obstruction. Appendix is not discretely visualized. Status post left hemicolectomy with suture line in the lower pelvis (series 3/image 105). Vascular/Lymphatic: No evidence of abdominal aortic aneurysm. Atherosclerotic calcifications of the abdominal aorta and branch vessels. Bulky upper abdominal lymphadenopathy, poorly evaluated on unenhanced CT but new from recent CTA, including a dominant 3.9 cm node in the porta hepatis (series 3/image 58). Additional 2.4 cm short axis node in the splenic hilum (series 3/image 9), new. Para-aortic lymphadenopathy, including a dominant 16 mm short axis node on the left (series 3/image 65), previously 12 mm. Reproductive: Prostate is notable for dystrophic calcifications. Other: Small volume abdominopelvic ascites. Musculoskeletal: Visualized osseous structures are within normal limits. IMPRESSION: Limited evaluation due to lack of intravenous contrast administration. Bulky upper abdominal lymphadenopathy, new. Progressive mediastinal and retroperitoneal lymphadenopathy. These are poorly evaluated but favor progressive lymphoma. Status post left hemicolectomy. No findings specific for recurrent or metastatic disease. Small bilateral pleural effusions, right greater than left. Small volume abdominopelvic ascites. Electronically Signed   By: Julian Hy M.D.   On: 02/19/2022 21:14   CT CHEST WO CONTRAST  Result Date: 02/19/2022 CLINICAL DATA:  CLL. History of colon cancer. Metastatic disease evaluation. Cough, nausea/vomiting. EXAM: CT CHEST, ABDOMEN AND PELVIS WITHOUT CONTRAST TECHNIQUE: Multidetector CT imaging of the chest, abdomen and pelvis was performed following the standard protocol without IV contrast. RADIATION  DOSE REDUCTION: This exam was performed according to the departmental dose-optimization program which includes automated exposure control, adjustment of the mA and/or kV according to patient size and/or use of iterative reconstruction technique. COMPARISON:  CTA chest dated 10/22/2020. CT abdomen/pelvis dated 02/28/2020. FINDINGS: CT CHEST FINDINGS Cardiovascular: The heart is normal in size. Small pericardial effusion. No evidence of thoracic aortic aneurysm. Atherosclerotic calcifications of the aortic arch. Mild three-vessel coronary atherosclerosis. Mediastinum/Nodes: Mediastinal lymphadenopathy, poorly evaluated due to lack of interest contrast administration, noting a dominant 15 mm short axis subcarinal node (series 3/image 29). This previously measured 11 mm. Visualized thyroid is unremarkable. Lungs/Pleura: Small bilateral pleural effusions, right greater than left. Associated patchy bilateral lower lobe opacities, favoring atelectasis. No frank interstitial edema. Evaluation lung parenchyma is constrained by respiratory motion. Within that constraint, there are no suspicious pulmonary nodules. No pneumothorax. Musculoskeletal: No focal osseous lesions. CT ABDOMEN PELVIS FINDINGS Hepatobiliary: Unenhanced liver is notable for scattered hepatic cysts measuring up to 14 mm (series 3/image 1), benign. Gallbladder is unremarkable. No intrahepatic or extrahepatic ductal dilatation. Pancreas: Within normal limits. Spleen: Within normal limits. Adrenals/Urinary Tract: Adrenal glands are within normal limits. Bilateral renal cysts, measuring up to 2.2 cm in the right upper kidney (series 3/image 60). No hydronephrosis. Bladder is within normal limits. Stomach/Bowel: Stomach is within normal limits. No evidence of bowel obstruction. Appendix is not discretely visualized. Status post left hemicolectomy with suture line in the lower pelvis (series 3/image 105). Vascular/Lymphatic: No evidence of abdominal aortic  aneurysm. Atherosclerotic calcifications of the abdominal aorta and branch vessels. Bulky upper abdominal lymphadenopathy, poorly evaluated on unenhanced CT but new from recent CTA, including a dominant 3.9 cm node in the porta hepatis (series 3/image 58). Additional 2.4 cm short axis node in the splenic hilum (series 3/image 9), new. Para-aortic lymphadenopathy, including a dominant 16 mm short axis node on the left (series 3/image 65), previously 12 mm. Reproductive:  Prostate is notable for dystrophic calcifications. Other: Small volume abdominopelvic ascites. Musculoskeletal: Visualized osseous structures are within normal limits. IMPRESSION: Limited evaluation due to lack of intravenous contrast administration. Bulky upper abdominal lymphadenopathy, new. Progressive mediastinal and retroperitoneal lymphadenopathy. These are poorly evaluated but favor progressive lymphoma. Status post left hemicolectomy. No findings specific for recurrent or metastatic disease. Small bilateral pleural effusions, right greater than left. Small volume abdominopelvic ascites. Electronically Signed   By: Julian Hy M.D.   On: 02/19/2022 21:14   DG CHEST PORT 1 VIEW  Result Date: 02/19/2022 CLINICAL DATA:  Weakness.  History of CLL. EXAM: PORTABLE CHEST 1 VIEW COMPARISON:  Chest radiographs 02/09/2022 FINDINGS: The cardiomediastinal silhouette is unchanged with normal heart size. Aortic atherosclerosis is noted. No airspace consolidation, edema, pleural effusion, or pneumothorax is identified. No acute osseous abnormality is seen. IMPRESSION: No active disease. Electronically Signed   By: Logan Bores M.D.   On: 02/19/2022 13:47    Medications: I have reviewed the patient's current medications.  Assessment/Plan:  81 year old with:  1.  Relapsed CLL with initial diagnosis in 2014.  Received systemic chemotherapy and currently on Imbruvica.  CT scan showed progressive lymphadenopathy could be responsible for his  symptoms.  The natural course of this disease was discussed at this time and treatment choices were reviewed with the patient as well as the prognosis.  It is unclear if this disease is refractory to Imbruvica and the fact that he has been off of it for a few months.  Alternative treatment options including venetoclax or systemic chemotherapy.  His pancytopenia is worrisome for infiltrative bone marrow disease which could make treatment very complicated and risky.  At this time, I recommended that continuing improving and monitoring his counts.  If no improvement, bone marrow biopsy and possible switch of therapy could be considered.  2.  Neutropenia: I agree with Granix and phylactic antibiotics given his fever.  His neutropenia is likely infiltrative CLL into the bone marrow.  3.  Prognosis and goals of care: Agree with DNR CODE STATUS.  His prognosis is guarded given his relapsed disease, frail status and developing progressive lymphadenopathy.  4.  GI concerns: He is experiencing anorexia and fullness.  GI consultation would be reasonable and consideration for radiation therapy could also be discussed.  Radiation to the abdominal lymph node could be problematic and can cause enteritis and worsening symptoms however.  I recommend continued supportive management over the next few days with growth factor support and supportive management and will reevaluate early next week.  35  minutes were dedicated to this visit.  More than 50% of the time was face-to-face.  The time was spent on reviewing laboratory data, imaging studies, discussing treatment options, and answering questions regarding future plan.     LOS: 0 days   [name] 02/20/2022, 7:17 AM

## 2022-02-20 NOTE — Progress Notes (Signed)
Modified Barium Swallow Progress Note  Patient Details  Name: [name] MRN: [id number] Date of Birth: 02/21/1941  Today's Date: 02/20/2022  Modified Barium Swallow completed.  Full report located under Chart Review in the Imaging Section.  Brief recommendations include the following:  Clinical Impression Pt presents with mild oral, moderate-severe  pharyngeal dysphagia, characterized as follows: Orally, pt exhibits slightly decreased bolus prep, and posterior spillage over tongue base. Pharyngeal swallow is characterized by trigger of the swallow reflex at the vallecular sinus on thin liquids and puree, and at the pyriform sinus on nectar thick liquids. Trace tongue base and vallecular sinus residue noted across consistencies. Pyriform residue present across consistencies, and increased with viscosity of texture. Pt exhibited trace aspiration from pyriform sinus residue on all consistencies given with significantly delayed or absent cough response. Risk of aspiration increases further as residue thins with secretions or additional boluses. Recommend NPO at this time, including medications. Medical team was informed of results and recommendations. Results were also discussed with pt and wife. SLP will follow at bedside to begin dysphagia treatment with the goal for return to PO status safely.  Swallow Evaluation Recommendations  SLP Diet Recommendations: NPO   Medication Administration: Via alternative means   Oral Care Recommendations: Oral care QID;Staff/trained caregiver to provide oral care   Other Recommendations: Have oral suction available   Georgena Weisheit B. Quentin Ore, [facility], [place] Speech Language Pathologist Office: [phone]  Shonna Chock 02/20/2022,3:32 PM

## 2022-02-20 NOTE — Evaluation (Signed)
Clinical/Bedside Swallow Evaluation Patient Details  Name: [name] MRN: [id number] Date of Birth: 13-Jul-1941  Today's Date: 02/20/2022 Time: SLP Start Time (ACUTE ONLY): 1250 SLP Stop Time (ACUTE ONLY): 1305 SLP Time Calculation (min) (ACUTE ONLY): 15 min  Past Medical History:  Past Medical History:  Diagnosis Date   CLL (chronic lymphocytic leukemia) ([place])    chemotherapy   Colon cancer ([place])    Colorectal cancer ([place])    COLORECTAL DX 2/09, treated surgically   Diabetes mellitus    Hypercholesterolemia    Hypertension    Pneumonia    Bilateral pneumonia   Past Surgical History:  Past Surgical History:  Procedure Laterality Date   HEMICOLECTOMY     nasal polyps     S/P nasal surgery   HPI:  81yo male admitted 02/19/22 with generalized weakness, recurrent falls. PMH: CLL, DM2, HTN, colorectal cancer (2009), HLD. Recent hospitalization due to poor appetite/intake, FTT.    Assessment / Plan / Recommendation  Clinical Impression  Pt seen at bedside for assessment of swallow function and safety. Pt was seated (partially reclined) and drinking thin liquids upon arrival of SLP. Pt was noted to have cough response after thin liquid trials. He was positioned to be upright, but continued to cough. Pt was noted to try to continue drinking/eating while coughing, and required cues to wait until he stopped coughing. After congested nonproductive coughing ceased, pt was given small boluses of puree, which appeared to be tolerated without cough response. Boluses of nectar thick liquid appeared to be tolerated initially, however, pt began to cough as soon as he placed a small piece of graham cracker in his mouth. Congested nonproductive cough continued, and PO trials were terminated. Pt would benefit from MBS to objectively assess swallow physiology and identify least restrictive diet. SLP Visit Diagnosis: Dysphagia, unspecified (R13.10)    Aspiration Risk  Severe aspiration risk;Risk for  inadequate nutrition/hydration    Diet Recommendation Defer diet recommendation until after instrumental study       Other  Recommendations Pending MBS   Recommendations for follow up therapy are one component of a multi-disciplinary discharge planning process, led by the attending physician.  Recommendations may be updated based on patient status, additional functional criteria and insurance authorization.  Follow up Recommendations  Pending MBS     Assistance Recommended at Discharge  Pending MBS  Functional Status Assessment  Pending MBS  Frequency and Duration  Pending MBS         Prognosis Prognosis for Safe Diet Advancement: Fair      Swallow Study   General Date of Onset: 02/19/22 HPI: 81yo male admitted 02/19/22 with generalized weakness, recurrent falls. PMH: CLL, DM2, HTN, colorectal cancer (2009), HLD. Recent hospitalization due to poor appetite/intake, FTT. Type of Study: Bedside Swallow Evaluation Previous Swallow Assessment: none found Diet Prior to this Study: Regular;Thin liquids Temperature Spikes Noted: No Respiratory Status: Room air History of Recent Intubation: No Behavior/Cognition: Alert;Cooperative;Pleasant mood Oral Cavity Assessment: Within Functional Limits Oral Care Completed by SLP: No Oral Cavity - Dentition: Adequate natural dentition Vision: Functional for self-feeding Self-Feeding Abilities: Able to feed self;Needs set up;Needs assist Patient Positioning: Upright in bed Baseline Vocal Quality: Normal Volitional Cough: Strong;Congested Volitional Swallow: Able to elicit    Oral/Motor/Sensory Function Overall Oral Motor/Sensory Function: Within functional limits   Ice Chips Ice chips: Not tested   Thin Liquid Thin Liquid: Not tested Other Comments: Pt was observed drinking thin liquids upon arrival.    Nectar Thick Nectar Thick  Liquid: Impaired Presentation: Cup;Straw Pharyngeal Phase Impairments: Cough - Delayed   Honey Thick Honey  Thick Liquid: Not tested   Puree Puree: Within functional limits Presentation: Spoon   Solid     Solid: Within functional limits     Mekisha Bittel B. Quentin Ore, [facility], [place] Speech Language Pathologist Office: [phone]  Shonna Chock 02/20/2022,1:51 PM

## 2022-02-21 ENCOUNTER — Inpatient Hospital Stay (HOSPITAL_COMMUNITY): Payer: Medicare Other

## 2022-02-21 DIAGNOSIS — A419 Sepsis, unspecified organism: Secondary | ICD-10-CM | POA: Diagnosis not present

## 2022-02-21 DIAGNOSIS — Z794 Long term (current) use of insulin: Secondary | ICD-10-CM

## 2022-02-21 DIAGNOSIS — D709 Neutropenia, unspecified: Secondary | ICD-10-CM | POA: Diagnosis not present

## 2022-02-21 DIAGNOSIS — C9110 Chronic lymphocytic leukemia of B-cell type not having achieved remission: Secondary | ICD-10-CM | POA: Diagnosis not present

## 2022-02-21 DIAGNOSIS — D702 Other drug-induced agranulocytosis: Secondary | ICD-10-CM

## 2022-02-21 DIAGNOSIS — R7881 Bacteremia: Secondary | ICD-10-CM

## 2022-02-21 DIAGNOSIS — R6520 Severe sepsis without septic shock: Secondary | ICD-10-CM

## 2022-02-21 DIAGNOSIS — E860 Dehydration: Secondary | ICD-10-CM | POA: Diagnosis not present

## 2022-02-21 DIAGNOSIS — N1832 Chronic kidney disease, stage 3b: Secondary | ICD-10-CM | POA: Diagnosis not present

## 2022-02-21 DIAGNOSIS — J9600 Acute respiratory failure, unspecified whether with hypoxia or hypercapnia: Secondary | ICD-10-CM

## 2022-02-21 DIAGNOSIS — B952 Enterococcus as the cause of diseases classified elsewhere: Secondary | ICD-10-CM | POA: Diagnosis not present

## 2022-02-21 LAB — CBC WITH DIFFERENTIAL/PLATELET
Abs Immature Granulocytes: 0 10*3/uL (ref 0.00–0.07)
Basophils Absolute: 0 10*3/uL (ref 0.0–0.1)
Basophils Relative: 0 %
Eosinophils Absolute: 0 10*3/uL (ref 0.0–0.5)
Eosinophils Relative: 1 %
HCT: 26.8 % — ABNORMAL LOW (ref 39.0–52.0)
Hemoglobin: 8.8 g/dL — ABNORMAL LOW (ref 13.0–17.0)
Lymphocytes Relative: 56 %
Lymphs Abs: 0.5 10*3/uL — ABNORMAL LOW (ref 0.7–4.0)
MCH: 27.2 pg (ref 26.0–34.0)
MCHC: 32.8 g/dL (ref 30.0–36.0)
MCV: 83 fL (ref 80.0–100.0)
Metamyelocytes Relative: 2 %
Monocytes Absolute: 0.1 10*3/uL (ref 0.1–1.0)
Monocytes Relative: 7 %
Myelocytes: 2 %
Neutro Abs: 0.3 10*3/uL — CL (ref 1.7–7.7)
Neutrophils Relative %: 32 %
Platelet Morphology: NORMAL
Platelets: 151 10*3/uL (ref 150–400)
RBC: 3.23 MIL/uL — ABNORMAL LOW (ref 4.22–5.81)
RDW: 17.6 % — ABNORMAL HIGH (ref 11.5–15.5)
WBC: 0.9 10*3/uL — CL (ref 4.0–10.5)
nRBC: 0 % (ref 0.0–0.2)

## 2022-02-21 LAB — BLOOD CULTURE ID PANEL (REFLEXED) - BCID2

## 2022-02-21 LAB — COMPREHENSIVE METABOLIC PANEL
ALT: 26 U/L (ref 0–44)
AST: 31 U/L (ref 15–41)
Albumin: 2.2 g/dL — ABNORMAL LOW (ref 3.5–5.0)
Alkaline Phosphatase: 78 U/L (ref 38–126)
Anion gap: 8 (ref 5–15)
BUN: 36 mg/dL — ABNORMAL HIGH (ref 8–23)
CO2: 23 mmol/L (ref 22–32)
Calcium: 7.7 mg/dL — ABNORMAL LOW (ref 8.9–10.3)
Chloride: 100 mmol/L (ref 98–111)
Creatinine, Ser: 1.54 mg/dL — ABNORMAL HIGH (ref 0.61–1.24)
GFR, Estimated: 46 mL/min — ABNORMAL LOW (ref >60)
Glucose, Bld: 247 mg/dL — ABNORMAL HIGH (ref 70–99)
Potassium: 3.4 mmol/L — ABNORMAL LOW (ref 3.5–5.1)
Sodium: 131 mmol/L — ABNORMAL LOW (ref 135–145)
Total Bilirubin: 0.8 mg/dL (ref 0.3–1.2)
Total Protein: 4.7 g/dL — ABNORMAL LOW (ref 6.5–8.1)

## 2022-02-21 LAB — ECHOCARDIOGRAM COMPLETE
AR max vel: 2.24 cm2
AV Area VTI: 2.2 cm2
AV Area mean vel: 2.09 cm2
AV Mean grad: 3 mmHg
AV Peak grad: 6.2 mmHg
Ao pk vel: 1.24 m/s
Area-P 1/2: 4.49 cm2
Calc EF: 79.7 %
Height: 67 in
MV VTI: 2.35 cm2
S' Lateral: 1.1 cm
Single Plane A2C EF: 75.2 %
Single Plane A4C EF: 82.7 %
Weight: 1947.1 oz

## 2022-02-21 LAB — GLUCOSE, CAPILLARY
Glucose-Capillary: 238 mg/dL — ABNORMAL HIGH (ref 70–99)
Glucose-Capillary: 243 mg/dL — ABNORMAL HIGH (ref 70–99)
Glucose-Capillary: 275 mg/dL — ABNORMAL HIGH (ref 70–99)
Glucose-Capillary: 207 mg/dL — ABNORMAL HIGH (ref 70–99)
Glucose-Capillary: 227 mg/dL — ABNORMAL HIGH (ref 70–99)

## 2022-02-21 LAB — MAGNESIUM
Magnesium: 1.6 mg/dL — ABNORMAL LOW (ref 1.7–2.4)
Magnesium: 1.9 mg/dL (ref 1.7–2.4)

## 2022-02-21 LAB — VITAMIN D 25 HYDROXY (VIT D DEFICIENCY, FRACTURES): Vit D, 25-Hydroxy: 35.23 ng/mL (ref 30–100)

## 2022-02-21 LAB — PHOSPHORUS
Phosphorus: 2.7 mg/dL (ref 2.5–4.6)
Phosphorus: 2.1 mg/dL — ABNORMAL LOW (ref 2.5–4.6)

## 2022-02-21 MED ORDER — METOPROLOL TARTRATE 25 MG PO TABS
25.0000 mg | ORAL_TABLET | Freq: Two times a day (BID) | ORAL | Status: DC
  Administered 2022-02-21 – 2022-02-27 (×13): 25 mg
  Filled 2022-02-21 (×13): qty 1

## 2022-02-21 MED ORDER — POTASSIUM CHLORIDE 20 MEQ PO PACK
40.0000 meq | PACK | Freq: Once | ORAL | Status: AC
  Administered 2022-02-21: 40 meq
  Filled 2022-02-21: qty 2

## 2022-02-21 MED ORDER — SODIUM CHLORIDE 0.9 % IV SOLN
2.0000 g | Freq: Three times a day (TID) | INTRAVENOUS | Status: DC
  Administered 2022-02-21: 2 g via INTRAVENOUS
  Filled 2022-02-21 (×2): qty 2000

## 2022-02-21 MED ORDER — AMLODIPINE BESYLATE 5 MG PO TABS
2.5000 mg | ORAL_TABLET | Freq: Every day | ORAL | Status: DC
  Administered 2022-02-21 – 2022-02-27 (×7): 2.5 mg via NASOGASTRIC
  Filled 2022-02-21 (×7): qty 1

## 2022-02-21 MED ORDER — SODIUM CHLORIDE 0.9 % IV SOLN
2.0000 g | Freq: Four times a day (QID) | INTRAVENOUS | Status: DC
  Administered 2022-02-21 – 2022-02-23 (×9): 2 g via INTRAVENOUS
  Filled 2022-02-21: qty 2000
  Filled 2022-02-21: qty 2
  Filled 2022-02-21 (×8): qty 2000
  Filled 2022-02-21: qty 2

## 2022-02-21 MED ORDER — MAGNESIUM SULFATE 2 GM/50ML IV SOLN
2.0000 g | Freq: Once | INTRAVENOUS | Status: AC
  Administered 2022-02-21: 2 g via INTRAVENOUS
  Filled 2022-02-21: qty 50

## 2022-02-21 NOTE — Progress Notes (Signed)
PHARMACY - PHYSICIAN COMMUNICATION CRITICAL VALUE ALERT - BLOOD CULTURE IDENTIFICATION (BCID)  [name] is an 81 y.o. male who presented to [facility] on 02/19/2022 with a chief complaint of progressive and generalized weakness  Assessment:  4/4 enterococcus faecium, van a/b not detected  Name of physician (or Provider) Contacted: ouma  Current antibiotics: vancomycin and cefepime  Changes to prescribed antibiotics recommended:  Stop vanc and cefepime Start ampicillin 2gm IV q8h (renally adjusted)  Results for orders placed or performed during the hospital encounter of 02/19/22  Blood Culture ID Panel (Reflexed) (Collected: 02/20/2022  7:50 AM)  Result Value Ref Range   Enterococcus faecalis NOT DETECTED NOT DETECTED   Enterococcus Faecium DETECTED (A) NOT DETECTED   Listeria monocytogenes NOT DETECTED NOT DETECTED   Staphylococcus species NOT DETECTED NOT DETECTED   Staphylococcus aureus (BCID) NOT DETECTED NOT DETECTED   Staphylococcus epidermidis NOT DETECTED NOT DETECTED   Staphylococcus lugdunensis NOT DETECTED NOT DETECTED   Streptococcus species NOT DETECTED NOT DETECTED   Streptococcus agalactiae NOT DETECTED NOT DETECTED   Streptococcus pneumoniae NOT DETECTED NOT DETECTED   Streptococcus pyogenes NOT DETECTED NOT DETECTED   A.calcoaceticus-baumannii NOT DETECTED NOT DETECTED   Bacteroides fragilis NOT DETECTED NOT DETECTED   Enterobacterales NOT DETECTED NOT DETECTED   Enterobacter cloacae complex NOT DETECTED NOT DETECTED   Escherichia coli NOT DETECTED NOT DETECTED   Klebsiella aerogenes NOT DETECTED NOT DETECTED   Klebsiella oxytoca NOT DETECTED NOT DETECTED   Klebsiella pneumoniae NOT DETECTED NOT DETECTED   Proteus species NOT DETECTED NOT DETECTED   Salmonella species NOT DETECTED NOT DETECTED   Serratia marcescens NOT DETECTED NOT DETECTED   Haemophilus influenzae NOT DETECTED NOT DETECTED   Neisseria meningitidis NOT DETECTED NOT DETECTED    Pseudomonas aeruginosa NOT DETECTED NOT DETECTED   Stenotrophomonas maltophilia NOT DETECTED NOT DETECTED   Candida albicans NOT DETECTED NOT DETECTED   Candida auris NOT DETECTED NOT DETECTED   Candida glabrata NOT DETECTED NOT DETECTED   Candida krusei NOT DETECTED NOT DETECTED   Candida parapsilosis NOT DETECTED NOT DETECTED   Candida tropicalis NOT DETECTED NOT DETECTED   Cryptococcus neoformans/gattii NOT DETECTED NOT DETECTED   Vancomycin resistance NOT DETECTED NOT DETECTED   Dolly Rias RPh 02/21/2022, 2:17 AM

## 2022-02-21 NOTE — Progress Notes (Signed)
PHARMACY NOTE:  ANTIMICROBIAL RENAL DOSAGE ADJUSTMENT  Current antimicrobial regimen includes a mismatch between antimicrobial dosage and estimated renal function.  As per policy approved by the Pharmacy & Therapeutics and Medical Executive Committees, the antimicrobial dosage will be adjusted accordingly.  Current antimicrobial dosage:  ampicillin 2gm q8h  Indication: bacteremia  Renal Function:  Estimated Creatinine Clearance: 30.4 mL/min (A) (by C-G formula based on SCr of 1.54 mg/dL (H)). '[]'$      On intermittent HD, scheduled: '[]'$      On CRRT    Antimicrobial dosage has been changed to:  ampicillin 2gm q6h    Thank you for allowing pharmacy to be a part of this patient's care.  Lynelle Doctor, [facility] 02/21/2022 9:41 AM

## 2022-02-21 NOTE — Progress Notes (Signed)
PROGRESS NOTE  [name] KTG:[id number] DOB: January 09, 1941 DOA: 02/19/2022 PCP: [name], MD   LOS: 1 day   Brief Narrative / Interim history: 81 year old male with history of CLL initially diagnosed in 2014, status postchemotherapy and currently on Imbruvica, DM 2, HTN, colorectal cancer 2009, who came into the hospital with progressive and generalized weakness.  Patient has been experiencing some weight loss and poor appetite for the past several months, was seen by GI in February as an outpatient and underwent work-up without any significant major findings.  He was taken off Imbruvica in April as it was believed that it may be related to that, however he continued to decline.  He was hospitalized just 2 weeks ago and then again on 5/25 with failure to thrive.  When he left the hospital last time he was placed back on Imbruvica.  Repeat CT scan of the abdomen and pelvis on 5/25 shows recurrence of his CLL.  Oncology consulted  Subjective / 24h Interval events: He says he is doing well this morning.  Denies any chest pain, no vomiting this morning.  Complains of persistent poor appetite.  Assesement and Plan: Principal Problem:   Dehydration Active Problems:   Neutropenia in patient with hx of CLL   Chronic lymphocytic leukemia (HCC)   SIRS (systemic inflammatory response syndrome) (HCC)   Colon cancer (HCC)   Protein-calorie malnutrition, severe (HCC)   Hypertension   Hypercholesterolemia   Diabetes mellitus ([place])   Hyponatremia   Stage 3b chronic kidney disease (CKD) (HCC)   Neutropenic fever ([place])  Principal problem Neutropenic fever, sepsis due to Enterococcus bacteremia-patient met sepsis criteria on admission with tachycardia, tachypnea, neutropenia.  He was not initially febrile but did develop a fever within his first 24 hours of being in the hospital.  Blood cultures were obtained and he was started on broad-spectrum antibiotics with vancomycin and cefepime.  Cultures  eventually speciated Enterococcus, and he was switched to ampicillin.  ID consulted, work-up pending, 2D echo pending.  He is receiving Granix as well  Active problems Profound dehydration, failure to thrive, dysphagia-patient with progressive weakness, poor p.o. intake and worsening dehydration.  Unfortunately due to weakness he failed swallow eval on 5/26, and failed an MBS.  Dobbhoff was placed 5/26 and he was started on tube feeds.  Seems to be tolerating well.  Continue.  High risk of aspiration  Chronic kidney disease stage IIIb-baseline creatinine around 1.4-1.6, currently at baseline  Hypokalemia-replete and recheck in the morning  Hypomagnesemia-replete and recheck in the morning   CLL -initially believed to be in remission but CT scan done on 5/25 shows recurrent disease.  Oncology consulted and following   Hyponatremia-due to dehydration, stable, continue to monitor   Sinus tachycardia-likely due to dehydration, but also active malignancy   Essential hypertension-hypertensive on admission, resumed home metoprolol.  Hold lisinopril in the setting of his CKD.  More hypertensive, Norvasc resumed 9/36  Hyperlipidemia-continue statin  Anemia-likely due to underlying malignancy, no bleeding, continue to monitor hemoglobin.  Dropping a little bit this morning due to IV fluids  Leukopenia, neutropenia-due to recurrent malignancy.  Receiving Granix.  Antibiotics for neutropenic fever   Type 2 diabetes mellitus-hyperglycemic initially on BMP with sugar of 327, improved now.  Continue sliding scale   Severe protein calorie malnutrition-consult RD.  Marinol as above.      Scheduled Meds:  amLODipine  2.5 mg Per NG tube Daily   aspirin  81 mg Per Tube Daily   atorvastatin  40 mg Per Tube Daily   Chlorhexidine Gluconate Cloth  6 each Topical Daily   feeding supplement  237 mL Oral TID BM   feeding supplement (PROSource TF)  45 mL Per Tube BID   free water  100 mL Per Tube Q4H    heparin  5,000 Units Subcutaneous Q8H   insulin aspart  0-5 Units Subcutaneous QHS   insulin aspart  0-9 Units Subcutaneous TID WC   mouth rinse  15 mL Mouth Rinse BID   metoprolol tartrate  25 mg Per Tube BID   mirtazapine  7.5 mg Per Tube QHS   multivitamin with minerals  1 tablet Per Tube Daily   ondansetron  4 mg Per Tube Q8H   Or   ondansetron (ZOFRAN) IV  4 mg Intravenous Q8H   pantoprazole sodium  40 mg Per Tube BID   potassium chloride  40 mEq Per Tube Once   Tbo-filgastrim (GRANIX) SQ  300 mcg Subcutaneous q1800   thiamine  100 mg Per Tube Daily   Continuous Infusions:  sodium chloride Stopped (02/20/22 0850)   ampicillin (OMNIPEN) IV     feeding supplement (OSMOLITE 1.5 CAL) 1,000 mL (02/20/22 1755)   lactated ringers 125 mL/hr at 02/21/22 0515   magnesium sulfate bolus IVPB     PRN Meds:.sodium chloride, acetaminophen, guaiFENesin-dextromethorphan, labetalol, prochlorperazine     DVT prophylaxis: heparin injection 5,000 Units Start: 02/19/22 1430   Lab Results  Component Value Date   PLT 151 02/21/2022      Code Status: DNR  Family Communication: Wife at bedside  Status is: Inpatient   Level of care: Progressive  Consultants:  Oncology Infectious disease  Procedures:  Blood cultures 5/26-Enterococcus  Antimicrobials Vancomycin 5/26 >> 5/27 Cefepime 5/26 >> 5/27 Ampicillin 5/27 >>   Objective: Vitals:   02/21/22 0311 02/21/22 0420 02/21/22 0500 02/21/22 0636  BP: (!) 154/57   (!) 154/63  Pulse:    (!) 104  Resp: (!) 26   16  Temp: 98.5 F (36.9 C)   98.7 F (37.1 C)  TempSrc: Oral   Oral  SpO2: 100%   96%  Weight:  54.3 kg 55.2 kg   Height:        Intake/Output Summary (Last 24 hours) at 02/21/2022 0945 Last data filed at 02/21/2022 0515 Gross per 24 hour  Intake 3554.09 ml  Output 750 ml  Net 2804.09 ml    Wt Readings from Last 3 Encounters:  02/21/22 55.2 kg  02/09/22 52.8 kg  01/12/22 54.9 kg     Examination:  Constitutional: NAD Eyes: lids and conjunctivae normal, no scleral icterus ENMT: mmm Neck: normal, supple Respiratory: clear to auscultation bilaterally, no wheezing, no crackles.  Cardiovascular: Regular rate and rhythm, no murmurs / rubs / gallops. No LE edema. Abdomen: soft, no distention, no tenderness. Bowel sounds positive.  Skin: no rashes Neurologic: no focal deficits, equal strength   Data Reviewed: I have independently reviewed following labs and imaging studies   CBC Recent Labs  Lab 02/19/22 0235 02/20/22 0310 02/21/22 0632  WBC 1.2* 0.9* 0.9*  HGB 9.4* 8.6* 8.8*  HCT 28.3* 26.1* 26.8*  PLT 195 148* 151  MCV 79.5* 81.8 83.0  MCH 26.4 27.0 27.2  MCHC 33.2 33.0 32.8  RDW 16.7* 17.0* 17.6*  LYMPHSABS 1.0  --  0.5*  MONOABS 0.1  --  0.1  EOSABS 0.0  --  0.0  BASOSABS 0.0  --  0.0     Recent Labs  Lab 02/19/22  6644 02/19/22 0927 02/20/22 0310 02/21/22 0632  NA 131*  --  132* 131*  K 4.1  --  3.7 3.4*  CL 96*  --  101 100  CO2 22  --  22 23  GLUCOSE 327*  --  208* 247*  BUN 41*  --  34* 36*  CREATININE 1.74*  --  1.63* 1.54*  CALCIUM 9.1  --  8.0* 7.7*  AST 10*  --  18 31  ALT 10  --  15 26  ALKPHOS 103  --  79 78  BILITOT 0.9  --  1.1 0.8  ALBUMIN 3.5  --  2.4* 2.2*  MG  --   --   --  1.6*  LATICACIDVEN 3.4* 1.8  --   --      ------------------------------------------------------------------------------------------------------------------ No results for input(s): CHOL, HDL, LDLCALC, TRIG, CHOLHDL, LDLDIRECT in the last 72 hours.  Lab Results  Component Value Date   HGBA1C 6.9 (H) 02/09/2022   ------------------------------------------------------------------------------------------------------------------ No results for input(s): TSH, T4TOTAL, T3FREE, THYROIDAB in the last 72 hours.  Invalid input(s): FREET3  Cardiac Enzymes No results for input(s): CKMB, TROPONINI, MYOGLOBIN in the last 168 hours.  Invalid  input(s): CK ------------------------------------------------------------------------------------------------------------------ No results found for: BNP  CBG: Recent Labs  Lab 02/20/22 1733 02/20/22 2102 02/20/22 2339 02/21/22 0307 02/21/22 0720  GLUCAP 190* 213* 216* 238* 243*     Recent Results (from the past 240 hour(s))  Culture, blood (Routine X 2) w Reflex to ID Panel     Status: Abnormal (Preliminary result)   Collection Time: 02/20/22  7:50 AM   Specimen: BLOOD  Result Value Ref Range Status   Specimen Description   Final    BLOOD LEFT ANTECUBITAL Performed at [facility], [place] [address] [place], [place] [address]    Special Requests   Final    BOTTLES DRAWN AEROBIC AND ANAEROBIC Blood Culture adequate volume Performed at [place] [address] [place], [place] [address]    Culture  Setup Time   Final    GRAM POSITIVE COCCI IN BOTH AEROBIC AND ANAEROBIC BOTTLES CRITICAL RESULT CALLED TO, READ BACK BY AND VERIFIED WITH: E JACKSON,PHARMD@0200  02/21/22 [place]    Culture (A)  Final    ENTEROCOCCUS FAECIUM SUSCEPTIBILITIES TO FOLLOW Performed at [facility], [place] [address], [place], [place] [address]    Report Status PENDING  Incomplete  Blood Culture ID Panel (Reflexed)     Status: Abnormal   Collection Time: 02/20/22  7:50 AM  Result Value Ref Range Status   Enterococcus faecalis NOT DETECTED NOT DETECTED Final   Enterococcus Faecium DETECTED (A) NOT DETECTED Final    Comment: CRITICAL RESULT CALLED TO, READ BACK BY AND VERIFIED WITH: E JACKSON,PHARMD@0200  02/21/22 [place]    Listeria monocytogenes NOT DETECTED NOT DETECTED Final   Staphylococcus species NOT DETECTED NOT DETECTED Final   Staphylococcus aureus (BCID) NOT DETECTED NOT DETECTED Final   Staphylococcus epidermidis NOT DETECTED NOT DETECTED Final   Staphylococcus lugdunensis NOT DETECTED NOT DETECTED Final   Streptococcus species NOT DETECTED NOT  DETECTED Final   Streptococcus agalactiae NOT DETECTED NOT DETECTED Final   Streptococcus pneumoniae NOT DETECTED NOT DETECTED Final   Streptococcus pyogenes NOT DETECTED NOT DETECTED Final   A.calcoaceticus-baumannii NOT DETECTED NOT DETECTED Final   Bacteroides fragilis NOT DETECTED NOT DETECTED Final   Enterobacterales NOT DETECTED NOT DETECTED Final   Enterobacter cloacae complex NOT DETECTED NOT DETECTED Final   Escherichia coli NOT  DETECTED NOT DETECTED Final   Klebsiella aerogenes NOT DETECTED NOT DETECTED Final   Klebsiella oxytoca NOT DETECTED NOT DETECTED Final   Klebsiella pneumoniae NOT DETECTED NOT DETECTED Final   Proteus species NOT DETECTED NOT DETECTED Final   Salmonella species NOT DETECTED NOT DETECTED Final   Serratia marcescens NOT DETECTED NOT DETECTED Final   Haemophilus influenzae NOT DETECTED NOT DETECTED Final   Neisseria meningitidis NOT DETECTED NOT DETECTED Final   Pseudomonas aeruginosa NOT DETECTED NOT DETECTED Final   Stenotrophomonas maltophilia NOT DETECTED NOT DETECTED Final   Candida albicans NOT DETECTED NOT DETECTED Final   Candida auris NOT DETECTED NOT DETECTED Final   Candida glabrata NOT DETECTED NOT DETECTED Final   Candida krusei NOT DETECTED NOT DETECTED Final   Candida parapsilosis NOT DETECTED NOT DETECTED Final   Candida tropicalis NOT DETECTED NOT DETECTED Final   Cryptococcus neoformans/gattii NOT DETECTED NOT DETECTED Final   Vancomycin resistance NOT DETECTED NOT DETECTED Final    Comment: Performed at [facility], [place] [address] [place], [place] [address]  Culture, blood (Routine X 2) w Reflex to ID Panel     Status: None (Preliminary result)   Collection Time: 02/20/22  7:54 AM   Specimen: BLOOD  Result Value Ref Range Status   Specimen Description   Final    BLOOD BLOOD RIGHT FOREARM Performed at [facility] [address], [place], [place] [address]    Special Requests   Final    BOTTLES DRAWN  AEROBIC AND ANAEROBIC Blood Culture adequate volume Performed at [place] [address] [place], [place] [address]    Culture  Setup Time   Final    GRAM POSITIVE COCCI IN BOTH AEROBIC AND ANAEROBIC BOTTLES CRITICAL VALUE NOTED.  VALUE IS CONSISTENT WITH PREVIOUSLY REPORTED AND CALLED VALUE.    Culture   Final    NO GROWTH < 24 HOURS Performed at [facility], [place] [address] [place], [place] [address]    Report Status PENDING  Incomplete     Radiology Studies: DG Abd Portable 1V  Result Date: 02/20/2022 CLINICAL DATA:  NG tube placement EXAM: PORTABLE ABDOMEN - 1 VIEW COMPARISON:  02/09/2022 FINDINGS: Esophageal tube tip overlies the proximal stomach. There is a small amount of enteral contrast in the stomach and colon. IMPRESSION: Esophageal tube tip overlies the gastric fundus Electronically Signed   By: Donavan Foil M.D.   On: 02/20/2022 15:58   DG Swallowing Func-Speech Pathology  Result Date: 02/20/2022 Table formatting from the original result was not included. Objective Swallowing Evaluation: Type of Study: MBS-Modified Barium Swallow Study  Patient Details Name: [name] MRN: [id number] Date of Birth: [birth year] Today's Date: 02/20/2022 Time: SLP Start Time (ACUTE ONLY): 6578 -SLP Stop Time (ACUTE ONLY): 4696 SLP Time Calculation (min) (ACUTE ONLY): 20 min Past Medical History: Past Medical History: Diagnosis Date  CLL (chronic lymphocytic leukemia) ([place])   chemotherapy  Colon cancer ([place])   Colorectal cancer ([place])   COLORECTAL DX 2/09, treated surgically  Diabetes mellitus   Hypercholesterolemia   Hypertension   Pneumonia   Bilateral pneumonia Past Surgical History: Past Surgical History: Procedure Laterality Date  HEMICOLECTOMY    nasal polyps    S/P nasal surgery HPI: 81yo male admitted 02/19/22 with generalized weakness, recurrent falls. PMH: CLL, DM2, HTN, colorectal cancer (2009), HLD. Recent hospitalization due to poor appetite/intake, FTT.   Subjective: Pt seen in radiology for MBS.  Recommendations for follow up therapy  are one component of a multi-disciplinary discharge planning process, led by the attending physician.  Recommendations may be updated based on patient status, additional functional criteria and insurance authorization. Assessment / Plan / Recommendation   02/20/2022   3:18 PM Clinical Impressions Clinical Impression Pt presents with mild oral, moderate-severe  pharyngeal dysphagia, characterized as follows: Orally, pt exhibits slightly decreased bolus prep, and posterior spillage over tongue base. Pharyngeal swallow is characterized by trigger of the swallow reflex at the vallecular sinus on thin liquids and puree, and at the pyriform sinus on nectar thick liquids. Trace tongue base and vallecular sinus residue noted across consistencies. Pyriform residue present across consistencies, and increased with viscosity of texture. Pt exhibited trace aspiration from pyriform sinus residue on all consistencies given with significantly delayed or absent cough response. Risk of aspiration increases further as residue thins with secretions or additional boluses. Recommend NPO at this time, including medications. Medical team was informed of results and recommendations. Results were also discussed with pt and wife. SLP will follow at bedside to begin dysphagia treatment with the goal for return to PO status safely.  SLP Visit Diagnosis Dysphagia, oropharyngeal phase (R13.12) Impact on safety and function Severe aspiration risk;Risk for inadequate nutrition/hydration     02/20/2022   3:18 PM Treatment Recommendations Treatment Recommendations Therapy as outlined in treatment plan below     02/20/2022   3:28 PM Prognosis Prognosis for Safe Diet Advancement Fair Barriers to Reach Goals Severity of deficits   02/20/2022   3:18 PM Diet Recommendations SLP Diet Recommendations NPO Medication Administration Via alternative means     02/20/2022   3:18 PM Other  Recommendations Oral Care Recommendations Oral care QID;Staff/trained caregiver to provide oral care Other Recommendations Have oral suction available Follow Up Recommendations Other (comment) Assistance recommended at discharge Frequent or constant Supervision/Assistance Functional Status Assessment Patient has had a recent decline in their functional status and demonstrates the ability to make significant improvements in function in a reasonable and predictable amount of time.   02/20/2022   3:18 PM Frequency and Duration  Speech Therapy Frequency (ACUTE ONLY) min 1 x/week Treatment Duration 1 week     02/20/2022   3:18 PM Oral Phase Oral Phase [facility]    02/20/2022   3:18 PM Pharyngeal Phase Pharyngeal Phase Impaired    02/20/2022   3:18 PM Cervical Esophageal Phase  Cervical Esophageal Phase Impaired Celia B. Quentin Ore, [facility], CCC-SLP Speech Language Pathologist Office: [phone] Shonna Chock 02/20/2022, 3:30 PM                       Marzetta Board, MD, PhD [facility]  Between 7 am - 7 pm I am available, please contact me via Amion (for emergencies) or Securechat (non urgent messages)  Between 7 pm - 7 am I am not available, please contact night coverage MD/APP via Amion

## 2022-02-21 NOTE — Consult Note (Signed)
Date of Admission:  02/19/2022          Reason for Consult: Enterococcus faecium  bacteremia   Referring Provider: CHAMP auto consult and Costin Gherghe,MD   Assessment:  Enterococcus faecium  bacteremia rule out endocarditis Pericardial effusion Pleural effusion CLL with recurrence DM Colon cancer hx Neutropenia  CKD  Plan:  Narrowed to Ampicillin Follow-up culture data Consider TEE to evaluate aortic valve for possible endocarditis and also his pericardial effusion Given recurrence of CLL and complicated overall picture would consider palliative care consult   Active Problems:   Chronic lymphocytic leukemia (HCC)   Colon cancer (HCC)   Hypertension   Hypercholesterolemia   Diabetes mellitus ([place])   SIRS (systemic inflammatory response syndrome) (HCC)   Sepsis ([place])   Neutropenia in patient with hx of CLL   Protein-calorie malnutrition, severe ([place])   Dehydration   Hyponatremia   Stage 3b chronic kidney disease (CKD) (HCC)   Neutropenic fever (HCC)   Scheduled Meds:  amLODipine  2.5 mg Per NG tube Daily   aspirin  81 mg Per Tube Daily   atorvastatin  40 mg Per Tube Daily   Chlorhexidine Gluconate Cloth  6 each Topical Daily   feeding supplement  237 mL Oral TID BM   feeding supplement (PROSource TF)  45 mL Per Tube BID   free water  100 mL Per Tube Q4H   heparin  5,000 Units Subcutaneous Q8H   insulin aspart  0-5 Units Subcutaneous QHS   insulin aspart  0-9 Units Subcutaneous TID WC   mouth rinse  15 mL Mouth Rinse BID   metoprolol tartrate  25 mg Per Tube BID   mirtazapine  7.5 mg Per Tube QHS   multivitamin with minerals  1 tablet Per Tube Daily   ondansetron  4 mg Per Tube Q8H   Or   ondansetron (ZOFRAN) IV  4 mg Intravenous Q8H   pantoprazole sodium  40 mg Per Tube BID   Tbo-filgastrim (GRANIX) SQ  300 mcg Subcutaneous q1800   thiamine  100 mg Per Tube Daily   Continuous Infusions:  sodium chloride Stopped (02/20/22 0850)   ampicillin  (OMNIPEN) IV 2 g (02/21/22 1239)   feeding supplement (OSMOLITE 1.5 CAL) 1,000 mL (02/20/22 1755)   lactated ringers 50 mL/hr at 02/21/22 1000   PRN Meds:.sodium chloride, acetaminophen, guaiFENesin-dextromethorphan, labetalol, prochlorperazine  HPI: [name] is a 81 y.o. male history of colon cancer status post hemicolectomy, CLL diagnosed in 2014 status postchemotherapy and having been on Imbruvica diabetes mellitus who presented the hospital progressive weakness and dizziness.  He was having weight loss and poor appetite for several months and developing a cough recently.  He had been taken off Imbruvica in April as it was thought to potentially be contributing to his decline.  He was hospitalized 2 weeks ago with failure to thrive.  In the interim he was placed back on Imbruvica.  He is now admitted as mentioned with cough failure to thought thrive and weakness.  Blood cultures were taken which have now grown Enterococcus faecium.  He has been on broad-spectrum antibiotics that have been appropriately narrowed to ampicillin.  Formal susceptibility for the Enterococcus is still pending.  2D echocardiogram shows some sclerotic area on the aortic valve that could potentially represent endocarditis.  He also does have a pericardial effusion seen on 2D echocardiogram.  Repeat CT of the abdomen pelvis shows recurrence of his CLL and also pleural effusions.  [place] he was  neutropenic and he is now receiving G-CSF  I have ordered repeat blood cultures.  I think ultimately if he is going to have aggressive care that is driven by diagnostics he will need a transesophageal echocardiogram to evaluate his valves and his pericardial effusion.  1 could also consider empiric treatment for bacterial endocarditis as well.  There is no clear source of his bacteremia he does not have evidence of urinary tract infection and his CT abdomen pelvis does not show any evidence of overt infection in the  abdomen.  I do think he would benefit from palliative care consult if oncology agreeable to that given his complex picture and the recurrence of his cancer.  I spent 81 minutes with the patient including than 50% of the time in face to face counseling of the patient guarding his bloodstream infection and my concern for endocarditis, personally reviewing CT chest abdomen pelvis updated culture along with review of medical records in preparation for the visit and during the visit and in coordination of  his care.   Review of Systems: Review of Systems  Constitutional:  Positive for fever and weight loss. Negative for chills and malaise/fatigue.  HENT:  Negative for congestion and sore throat.   Eyes:  Negative for blurred vision and photophobia.  Respiratory:  Positive for cough. Negative for shortness of breath and wheezing.   Cardiovascular:  Negative for chest pain, palpitations and leg swelling.  Gastrointestinal:  Negative for abdominal pain, blood in stool, constipation, diarrhea, heartburn, melena, nausea and vomiting.  Genitourinary:  Negative for dysuria, flank pain and hematuria.  Musculoskeletal:  Negative for back pain, falls, joint pain and myalgias.  Skin:  Negative for itching and rash.  Neurological:  Positive for dizziness. Negative for focal weakness, loss of consciousness, weakness and headaches.  Endo/Heme/Allergies:  Does not bruise/bleed easily.  Psychiatric/Behavioral:  Negative for depression and suicidal ideas. The patient does not have insomnia.    Past Medical History:  Diagnosis Date   CLL (chronic lymphocytic leukemia) ([place])    chemotherapy   Colon cancer ([place])    Colorectal cancer ([place])    COLORECTAL DX 2/09, treated surgically   Diabetes mellitus    Hypercholesterolemia    Hypertension    Pneumonia    Bilateral pneumonia    Social History   Tobacco Use   Smoking status: Former    Packs/day: 0.80    Years: 35.00    Pack years: 28.00    Types:  Cigarettes   Smokeless tobacco: Never  Vaping Use   Vaping Use: Never used  Substance Use Topics   Alcohol use: No   Drug use: No    Family History  Problem Relation Age of Onset   Ovarian cancer Mother    Breast cancer Maternal Aunt    Allergies  Allergen Reactions   Hydrocodone-Acetaminophen Other (See Comments)    Loss of weight    OBJECTIVE: Blood pressure 137/60, pulse (!) 110, temperature 98.4 F (36.9 C), temperature source Oral, resp. rate 16, height '5\' 7"'$  (1.702 m), weight 55.2 kg, SpO2 97 %.  Physical Exam Constitutional:      Appearance: He is well-developed. He is ill-appearing.  HENT:     Head: Normocephalic.  Eyes:     Conjunctiva/sclera: Conjunctivae normal.  Cardiovascular:     Rate and Rhythm: Normal rate and regular rhythm.     Heart sounds: No murmur heard.   No friction rub. No gallop.  Pulmonary:     Effort: Pulmonary effort  is normal. No respiratory distress.     Breath sounds: No stridor. No wheezing or rhonchi.  Abdominal:     General: Bowel sounds are normal. There is no distension.     Palpations: Abdomen is soft.  Musculoskeletal:        General: No tenderness. Normal range of motion.     Cervical back: Normal range of motion and neck supple.  Skin:    General: Skin is warm and dry.     Coloration: Skin is not pale.     Findings: No erythema or rash.  Neurological:     General: No focal deficit present.     Mental Status: He is alert and oriented to person, place, and time.  Psychiatric:        Attention and Perception: Attention normal.        Mood and Affect: Mood is depressed.        Speech: Speech is delayed.        Behavior: Behavior normal.        Thought Content: Thought content normal.        Cognition and Memory: Cognition normal.        Judgment: Judgment normal.    Lab Results Lab Results  Component Value Date   WBC 0.9 (LL) 02/21/2022   HGB 8.8 (L) 02/21/2022   HCT 26.8 (L) 02/21/2022   MCV 83.0 02/21/2022    PLT 151 02/21/2022    Lab Results  Component Value Date   CREATININE 1.54 (H) 02/21/2022   BUN 36 (H) 02/21/2022   NA 131 (L) 02/21/2022   K 3.4 (L) 02/21/2022   CL 100 02/21/2022   CO2 23 02/21/2022    Lab Results  Component Value Date   ALT 26 02/21/2022   AST 31 02/21/2022   ALKPHOS 78 02/21/2022   BILITOT 0.8 02/21/2022     Microbiology: Recent Results (from the past 240 hour(s))  Culture, blood (Routine X 2) w Reflex to ID Panel     Status: Abnormal (Preliminary result)   Collection Time: 02/20/22  7:50 AM   Specimen: BLOOD  Result Value Ref Range Status   Specimen Description   Final    BLOOD LEFT ANTECUBITAL Performed at [facility], [place] [address] [place], [place] [address]    Special Requests   Final    BOTTLES DRAWN AEROBIC AND ANAEROBIC Blood Culture adequate volume Performed at [facility] [address] [place], [place] [address]    Culture  Setup Time   Final    GRAM POSITIVE COCCI IN BOTH AEROBIC AND ANAEROBIC BOTTLES CRITICAL RESULT CALLED TO, READ BACK BY AND VERIFIED WITH: E JACKSON,PHARMD'@0200'$  02/21/22 [place]    Culture (A)  Final    ENTEROCOCCUS FAECIUM SUSCEPTIBILITIES TO FOLLOW Performed at [facility], [place] [address], [place], [place] [address]    Report Status PENDING  Incomplete  Blood Culture ID Panel (Reflexed)     Status: Abnormal   Collection Time: 02/20/22  7:50 AM  Result Value Ref Range Status   Enterococcus faecalis NOT DETECTED NOT DETECTED Final   Enterococcus Faecium DETECTED (A) NOT DETECTED Final    Comment: CRITICAL RESULT CALLED TO, READ BACK BY AND VERIFIED WITH: E JACKSON,PHARMD'@0200'$  02/21/22 [place]    Listeria monocytogenes NOT DETECTED NOT DETECTED Final   Staphylococcus species NOT DETECTED NOT DETECTED Final   Staphylococcus aureus (BCID) NOT DETECTED NOT DETECTED Final   Staphylococcus epidermidis NOT DETECTED NOT DETECTED Final  Staphylococcus lugdunensis NOT DETECTED NOT  DETECTED Final   Streptococcus species NOT DETECTED NOT DETECTED Final   Streptococcus agalactiae NOT DETECTED NOT DETECTED Final   Streptococcus pneumoniae NOT DETECTED NOT DETECTED Final   Streptococcus pyogenes NOT DETECTED NOT DETECTED Final   A.calcoaceticus-baumannii NOT DETECTED NOT DETECTED Final   Bacteroides fragilis NOT DETECTED NOT DETECTED Final   Enterobacterales NOT DETECTED NOT DETECTED Final   Enterobacter cloacae complex NOT DETECTED NOT DETECTED Final   Escherichia coli NOT DETECTED NOT DETECTED Final   Klebsiella aerogenes NOT DETECTED NOT DETECTED Final   Klebsiella oxytoca NOT DETECTED NOT DETECTED Final   Klebsiella pneumoniae NOT DETECTED NOT DETECTED Final   Proteus species NOT DETECTED NOT DETECTED Final   Salmonella species NOT DETECTED NOT DETECTED Final   Serratia marcescens NOT DETECTED NOT DETECTED Final   Haemophilus influenzae NOT DETECTED NOT DETECTED Final   Neisseria meningitidis NOT DETECTED NOT DETECTED Final   Pseudomonas aeruginosa NOT DETECTED NOT DETECTED Final   Stenotrophomonas maltophilia NOT DETECTED NOT DETECTED Final   Candida albicans NOT DETECTED NOT DETECTED Final   Candida auris NOT DETECTED NOT DETECTED Final   Candida glabrata NOT DETECTED NOT DETECTED Final   Candida krusei NOT DETECTED NOT DETECTED Final   Candida parapsilosis NOT DETECTED NOT DETECTED Final   Candida tropicalis NOT DETECTED NOT DETECTED Final   Cryptococcus neoformans/gattii NOT DETECTED NOT DETECTED Final   Vancomycin resistance NOT DETECTED NOT DETECTED Final    Comment: Performed at [facility], [address] [place], [place] [address]  Culture, blood (Routine X 2) w Reflex to ID Panel     Status: None (Preliminary result)   Collection Time: 02/20/22  7:54 AM   Specimen: BLOOD  Result Value Ref Range Status   Specimen Description   Final    BLOOD BLOOD RIGHT FOREARM Performed at [facility] [address], [place], [place]  [address]    Special Requests   Final    BOTTLES DRAWN AEROBIC AND ANAEROBIC Blood Culture adequate volume Performed at [facility] [address] [place], [place] [address]    Culture  Setup Time   Final    GRAM POSITIVE COCCI IN BOTH AEROBIC AND ANAEROBIC BOTTLES CRITICAL VALUE NOTED.  VALUE IS CONSISTENT WITH PREVIOUSLY REPORTED AND CALLED VALUE.    Culture   Final    NO GROWTH < 24 HOURS Performed at [facility], [place] [address], [place],  [address]    Report Status PENDING  Incomplete    Alcide Evener, [facility] [phone] pager  02/21/2022, 3:32 PM

## 2022-02-22 DIAGNOSIS — E860 Dehydration: Secondary | ICD-10-CM | POA: Diagnosis not present

## 2022-02-22 DIAGNOSIS — R627 Adult failure to thrive: Principal | ICD-10-CM

## 2022-02-22 DIAGNOSIS — E878 Other disorders of electrolyte and fluid balance, not elsewhere classified: Secondary | ICD-10-CM

## 2022-02-22 DIAGNOSIS — B952 Enterococcus as the cause of diseases classified elsewhere: Secondary | ICD-10-CM | POA: Diagnosis not present

## 2022-02-22 DIAGNOSIS — Z7189 Other specified counseling: Secondary | ICD-10-CM

## 2022-02-22 DIAGNOSIS — R7881 Bacteremia: Secondary | ICD-10-CM | POA: Diagnosis not present

## 2022-02-22 DIAGNOSIS — C9110 Chronic lymphocytic leukemia of B-cell type not having achieved remission: Secondary | ICD-10-CM | POA: Diagnosis not present

## 2022-02-22 DIAGNOSIS — R5081 Fever presenting with conditions classified elsewhere: Secondary | ICD-10-CM | POA: Diagnosis not present

## 2022-02-22 DIAGNOSIS — N1832 Chronic kidney disease, stage 3b: Secondary | ICD-10-CM | POA: Diagnosis not present

## 2022-02-22 DIAGNOSIS — D709 Neutropenia, unspecified: Secondary | ICD-10-CM | POA: Diagnosis not present

## 2022-02-22 LAB — CBC WITH DIFFERENTIAL/PLATELET
Abs Immature Granulocytes: 0.02 10*3/uL (ref 0.00–0.07)
Basophils Absolute: 0 10*3/uL (ref 0.0–0.1)
Basophils Relative: 0 %
Eosinophils Absolute: 0 10*3/uL (ref 0.0–0.5)
Eosinophils Relative: 0 %
HCT: 27.1 % — ABNORMAL LOW (ref 39.0–52.0)
Hemoglobin: 8.6 g/dL — ABNORMAL LOW (ref 13.0–17.0)
Immature Granulocytes: 2 %
Lymphocytes Relative: 63 %
Lymphs Abs: 0.7 10*3/uL (ref 0.7–4.0)
MCH: 26.8 pg (ref 26.0–34.0)
MCHC: 31.7 g/dL (ref 30.0–36.0)
MCV: 84.4 fL (ref 80.0–100.0)
Monocytes Absolute: 0.1 10*3/uL (ref 0.1–1.0)
Monocytes Relative: 6 %
Neutro Abs: 0.3 10*3/uL — CL (ref 1.7–7.7)
Neutrophils Relative %: 29 %
Platelet Morphology: NORMAL
Platelets: 125 10*3/uL — ABNORMAL LOW (ref 150–400)
RBC: 3.21 MIL/uL — ABNORMAL LOW (ref 4.22–5.81)
RDW: 17.9 % — ABNORMAL HIGH (ref 11.5–15.5)
WBC: 1 10*3/uL — CL (ref 4.0–10.5)
nRBC: 0 % (ref 0.0–0.2)

## 2022-02-22 LAB — GLUCOSE, CAPILLARY
Glucose-Capillary: 153 mg/dL — ABNORMAL HIGH (ref 70–99)
Glucose-Capillary: 168 mg/dL — ABNORMAL HIGH (ref 70–99)
Glucose-Capillary: 240 mg/dL — ABNORMAL HIGH (ref 70–99)
Glucose-Capillary: 266 mg/dL — ABNORMAL HIGH (ref 70–99)
Glucose-Capillary: 270 mg/dL — ABNORMAL HIGH (ref 70–99)
Glucose-Capillary: 308 mg/dL — ABNORMAL HIGH (ref 70–99)

## 2022-02-22 LAB — COMPREHENSIVE METABOLIC PANEL
ALT: 29 U/L (ref 0–44)
AST: 32 U/L (ref 15–41)
Albumin: 1.9 g/dL — ABNORMAL LOW (ref 3.5–5.0)
Alkaline Phosphatase: 82 U/L (ref 38–126)
Anion gap: 7 (ref 5–15)
BUN: 36 mg/dL — ABNORMAL HIGH (ref 8–23)
CO2: 22 mmol/L (ref 22–32)
Calcium: 7.3 mg/dL — ABNORMAL LOW (ref 8.9–10.3)
Chloride: 100 mmol/L (ref 98–111)
Creatinine, Ser: 1.71 mg/dL — ABNORMAL HIGH (ref 0.61–1.24)
GFR, Estimated: 40 mL/min — ABNORMAL LOW (ref >60)
Glucose, Bld: 323 mg/dL — ABNORMAL HIGH (ref 70–99)
Potassium: 3.8 mmol/L (ref 3.5–5.1)
Sodium: 129 mmol/L — ABNORMAL LOW (ref 135–145)
Total Bilirubin: 0.6 mg/dL (ref 0.3–1.2)
Total Protein: 4.4 g/dL — ABNORMAL LOW (ref 6.5–8.1)

## 2022-02-22 LAB — PHOSPHORUS
Phosphorus: 1.9 mg/dL — ABNORMAL LOW (ref 2.5–4.6)
Phosphorus: 3.6 mg/dL (ref 2.5–4.6)

## 2022-02-22 LAB — MAGNESIUM
Magnesium: 1.8 mg/dL (ref 1.7–2.4)
Magnesium: 1.8 mg/dL (ref 1.7–2.4)

## 2022-02-22 MED ORDER — TBO-FILGRASTIM 300 MCG/0.5ML ~~LOC~~ SOSY
300.0000 ug | PREFILLED_SYRINGE | Freq: Every day | SUBCUTANEOUS | Status: AC
  Administered 2022-02-22 – 2022-02-24 (×3): 300 ug via SUBCUTANEOUS
  Filled 2022-02-22 (×3): qty 0.5

## 2022-02-22 MED ORDER — CHLORHEXIDINE GLUCONATE 0.12 % MT SOLN
15.0000 mL | Freq: Two times a day (BID) | OROMUCOSAL | Status: DC
  Administered 2022-02-22 – 2022-03-07 (×22): 15 mL via OROMUCOSAL
  Filled 2022-02-22 (×23): qty 15

## 2022-02-22 MED ORDER — SODIUM PHOSPHATES 45 MMOLE/15ML IV SOLN
30.0000 mmol | Freq: Once | INTRAVENOUS | Status: AC
  Administered 2022-02-22: 30 mmol via INTRAVENOUS
  Filled 2022-02-22: qty 10

## 2022-02-22 NOTE — Plan of Care (Signed)

## 2022-02-22 NOTE — Progress Notes (Signed)
Subjective: No new complaints   Antibiotics:  Anti-infectives (From admission, onward)    Start     Dose/Rate Route Frequency Ordered Stop   02/21/22 1000  ampicillin (OMNIPEN) 2 g in sodium chloride 0.9 % 100 mL IVPB        2 g 300 mL/hr over 20 Minutes Intravenous Every 6 hours 02/21/22 0940     02/21/22 0300  ampicillin (OMNIPEN) 2 g in sodium chloride 0.9 % 100 mL IVPB  Status:  Discontinued        2 g 300 mL/hr over 20 Minutes Intravenous Every 8 hours 02/21/22 0215 02/21/22 0940   02/20/22 0815  vancomycin (VANCOREADY) IVPB 1250 mg/250 mL  Status:  Discontinued        1,250 mg 166.7 mL/hr over 90 Minutes Intravenous Every 48 hours 02/20/22 0715 02/21/22 0215   02/20/22 0800  ceFEPIme (MAXIPIME) 2 g in sodium chloride 0.9 % 100 mL IVPB  Status:  Discontinued        2 g 200 mL/hr over 30 Minutes Intravenous Every 24 hours 02/20/22 0643 02/21/22 0215   02/20/22 0800  vancomycin (VANCOREADY) IVPB 1750 mg/350 mL  Status:  Discontinued        1,750 mg 175 mL/hr over 120 Minutes Intravenous Every 48 hours 02/20/22 0703 02/20/22 0715       Medications: Scheduled Meds:  amLODipine  2.5 mg Per NG tube Daily   aspirin  81 mg Per Tube Daily   atorvastatin  40 mg Per Tube Daily   chlorhexidine  15 mL Mouth Rinse BID   Chlorhexidine Gluconate Cloth  6 each Topical Daily   feeding supplement  237 mL Oral TID BM   feeding supplement (PROSource TF)  45 mL Per Tube BID   free water  100 mL Per Tube Q4H   heparin  5,000 Units Subcutaneous Q8H   insulin aspart  0-5 Units Subcutaneous QHS   insulin aspart  0-9 Units Subcutaneous TID WC   mouth rinse  15 mL Mouth Rinse BID   metoprolol tartrate  25 mg Per Tube BID   mirtazapine  7.5 mg Per Tube QHS   multivitamin with minerals  1 tablet Per Tube Daily   ondansetron  4 mg Per Tube Q8H   Or   ondansetron (ZOFRAN) IV  4 mg Intravenous Q8H   pantoprazole sodium  40 mg Per Tube BID   Tbo-filgastrim (GRANIX) SQ  300 mcg  Subcutaneous q1800   thiamine  100 mg Per Tube Daily   Continuous Infusions:  sodium chloride Stopped (02/20/22 0850)   ampicillin (OMNIPEN) IV 2 g (02/22/22 1020)   feeding supplement (OSMOLITE 1.5 CAL) 35 mL/hr at 02/21/22 1711   lactated ringers 50 mL/hr at 02/21/22 1709   sodium phosphate  Dextrose 5% IVPB 30 mmol (02/22/22 1333)   PRN Meds:.sodium chloride, acetaminophen, guaiFENesin-dextromethorphan, labetalol, prochlorperazine    Objective: Weight change: 1.5 kg  Intake/Output Summary (Last 24 hours) at 02/22/2022 1541 Last data filed at 02/22/2022 1207 Gross per 24 hour  Intake 1028.56 ml  Output 1000 ml  Net 28.56 ml   Blood pressure (!) 127/51, pulse 97, temperature 98.5 F (36.9 C), temperature source Oral, resp. rate 14, height '5\' 7"'$  (1.702 m), weight 55.8 kg, SpO2 94 %. Temp:  [98.2 F (36.8 C)-99.2 F (37.3 C)] 98.5 F (36.9 C) (05/28 1307) Pulse Rate:  [97-121] 97 (05/28 1307) Resp:  [14-20] 14 (05/28 1307) BP: (126-132)/(50-64) 127/51 (05/28 1307) SpO2:  [  94 %-96 %] 94 % (05/28 1307) Weight:  [55.8 kg] 55.8 kg (05/28 0424)  Physical Exam: Physical Exam Constitutional:      Appearance: He is well-developed. He is ill-appearing.  HENT:     Head: Normocephalic and atraumatic.  Eyes:     Conjunctiva/sclera: Conjunctivae normal.  Cardiovascular:     Rate and Rhythm: Normal rate and regular rhythm.  Pulmonary:     Effort: Pulmonary effort is normal. No respiratory distress.     Breath sounds: Normal breath sounds. No stridor. No wheezing or rhonchi.  Abdominal:     General: There is no distension.     Palpations: Abdomen is soft.  Musculoskeletal:        General: Normal range of motion.     Cervical back: Normal range of motion and neck supple.  Skin:    General: Skin is warm and dry.     Findings: No erythema or rash.  Neurological:     General: No focal deficit present.     Mental Status: He is alert and oriented to person, place, and time.   Psychiatric:        Mood and Affect: Mood normal.        Behavior: Behavior normal.        Thought Content: Thought content normal.        Judgment: Judgment normal.     CBC:    BMET Recent Labs    02/21/22 0632 02/22/22 0637  NA 131* 129*  K 3.4* 3.8  CL 100 100  CO2 23 22  GLUCOSE 247* 323*  BUN 36* 36*  CREATININE 1.54* 1.71*  CALCIUM 7.7* 7.3*     Liver Panel  Recent Labs    02/21/22 0632 02/22/22 0637  PROT 4.7* 4.4*  ALBUMIN 2.2* 1.9*  AST 31 32  ALT 26 29  ALKPHOS 78 82  BILITOT 0.8 0.6       Sedimentation Rate No results for input(s): ESRSEDRATE in the last 72 hours. C-Reactive Protein No results for input(s): CRP in the last 72 hours.  Micro Results: Recent Results (from the past 720 hour(s))  Culture, blood (Routine x 2)     Status: None   Collection Time: 02/09/22 10:23 AM   Specimen: Right Antecubital; Blood  Result Value Ref Range Status   Specimen Description   Final    RIGHT ANTECUBITAL Performed at [facility], [address] [place], [place] [address]    Special Requests   Final    BOTTLES DRAWN AEROBIC AND ANAEROBIC Blood Culture adequate volume Performed at [facility], [address] [place], [place] [address]    Culture   Final    NO GROWTH 5 DAYS Performed at [facility], [place] [address], [place], [place] [address]    Report Status 02/14/2022 FINAL  Final  Culture, blood (Routine x 2)     Status: None   Collection Time: 02/09/22 10:23 AM   Specimen: BLOOD LEFT FOREARM  Result Value Ref Range Status   Specimen Description   Final    BLOOD LEFT FOREARM Performed at [facility], [address], [place], [place] [address]    Special Requests   Final    BOTTLES DRAWN AEROBIC AND ANAEROBIC Blood Culture adequate volume Performed at [facility], [address], [place], [place] [address]    Culture   Final    NO GROWTH 5  DAYS Performed at [facility], [place]  [address] [place], [place] [address]    Report Status 02/14/2022 FINAL  Final  Urine Culture     Status: None   Collection Time: 02/09/22 10:23 AM   Specimen: Urine, Clean Catch  Result Value Ref Range Status   Specimen Description   Final    URINE, CLEAN CATCH Performed at [facility], [address], [place], [place] [address]    Special Requests   Final    NONE Performed at [facility], [address], [place], [place] [address]    Culture   Final    NO GROWTH Performed at [facility], [place] [address], [place], [place] [address]    Report Status 02/10/2022 FINAL  Final  MRSA Next Gen by PCR, Nasal     Status: None   Collection Time: 02/09/22  2:39 PM   Specimen: Nasal Mucosa; Nasal Swab  Result Value Ref Range Status   MRSA by PCR Next Gen NOT DETECTED NOT DETECTED Final    Comment: (NOTE) The GeneXpert MRSA Assay (FDA approved for NASAL specimens only), is one component of a comprehensive MRSA colonization surveillance program. It is not intended to diagnose MRSA infection nor to guide or monitor treatment for MRSA infections. Test performance is not FDA approved in patients less than 8 years old. Performed at [facility], [place] [address] [place], [place] [address]   Culture, blood (Routine X 2) w Reflex to ID Panel     Status: Abnormal   Collection Time: 02/20/22  7:50 AM   Specimen: BLOOD  Result Value Ref Range Status   Specimen Description   Final    BLOOD LEFT ANTECUBITAL Performed at [place] [address] [place], [place] [address]    Special Requests   Final    BOTTLES DRAWN AEROBIC AND ANAEROBIC Blood Culture adequate volume Performed at [place] [address], [place], [place] [address]    Culture  Setup Time   Final    GRAM POSITIVE COCCI IN BOTH AEROBIC AND ANAEROBIC BOTTLES CRITICAL RESULT  CALLED TO, READ BACK BY AND VERIFIED WITH: E JACKSON,PHARMD'@0200'$  02/21/22 [place] Performed at [facility], [address], [place], [place] [address]    Culture ENTEROCOCCUS FAECIUM (A)  Final   Report Status 02/22/2022 FINAL  Final   Organism ID, Bacteria ENTEROCOCCUS FAECIUM  Final      Susceptibility   Enterococcus faecium - MIC*    AMPICILLIN <=2 SENSITIVE Sensitive     VANCOMYCIN <=0.5 SENSITIVE Sensitive     GENTAMICIN SYNERGY SENSITIVE Sensitive     * ENTEROCOCCUS FAECIUM  Blood Culture ID Panel (Reflexed)     Status: Abnormal   Collection Time: 02/20/22  7:50 AM  Result Value Ref Range Status   Enterococcus faecalis NOT DETECTED NOT DETECTED Final   Enterococcus Faecium DETECTED (A) NOT DETECTED Final    Comment: CRITICAL RESULT CALLED TO, READ BACK BY AND VERIFIED WITH: E JACKSON,PHARMD'@0200'$  02/21/22 [place]    Listeria monocytogenes NOT DETECTED NOT DETECTED Final   Staphylococcus species NOT DETECTED NOT DETECTED Final   Staphylococcus aureus (BCID) NOT DETECTED NOT DETECTED Final   Staphylococcus epidermidis NOT DETECTED NOT DETECTED Final   Staphylococcus lugdunensis NOT DETECTED NOT DETECTED Final   Streptococcus species NOT DETECTED NOT DETECTED Final   Streptococcus agalactiae NOT DETECTED NOT DETECTED Final   Streptococcus pneumoniae NOT DETECTED NOT DETECTED Final   Streptococcus pyogenes NOT DETECTED NOT DETECTED Final   A.calcoaceticus-baumannii NOT  DETECTED NOT DETECTED Final   Bacteroides fragilis NOT DETECTED NOT DETECTED Final   Enterobacterales NOT DETECTED NOT DETECTED Final   Enterobacter cloacae complex NOT DETECTED NOT DETECTED Final   Escherichia coli NOT DETECTED NOT DETECTED Final   Klebsiella aerogenes NOT DETECTED NOT DETECTED Final   Klebsiella oxytoca NOT DETECTED NOT DETECTED Final   Klebsiella pneumoniae NOT DETECTED NOT DETECTED Final   Proteus species NOT DETECTED NOT DETECTED Final   Salmonella species NOT DETECTED NOT DETECTED Final    Serratia marcescens NOT DETECTED NOT DETECTED Final   Haemophilus influenzae NOT DETECTED NOT DETECTED Final   Neisseria meningitidis NOT DETECTED NOT DETECTED Final   Pseudomonas aeruginosa NOT DETECTED NOT DETECTED Final   Stenotrophomonas maltophilia NOT DETECTED NOT DETECTED Final   Candida albicans NOT DETECTED NOT DETECTED Final   Candida auris NOT DETECTED NOT DETECTED Final   Candida glabrata NOT DETECTED NOT DETECTED Final   Candida krusei NOT DETECTED NOT DETECTED Final   Candida parapsilosis NOT DETECTED NOT DETECTED Final   Candida tropicalis NOT DETECTED NOT DETECTED Final   Cryptococcus neoformans/gattii NOT DETECTED NOT DETECTED Final   Vancomycin resistance NOT DETECTED NOT DETECTED Final    Comment: Performed at [facility], [address], [place], [place] [address]  Culture, blood (Routine X 2) w Reflex to ID Panel     Status: Abnormal (Preliminary result)   Collection Time: 02/20/22  7:54 AM   Specimen: BLOOD  Result Value Ref Range Status   Specimen Description   Final    BLOOD BLOOD RIGHT FOREARM Performed at [place] [address], [place], [place] [address]    Special Requests   Final    BOTTLES DRAWN AEROBIC AND ANAEROBIC Blood Culture adequate volume Performed at [facility] [address], [place], [place] [address]    Culture  Setup Time   Final    GRAM POSITIVE COCCI IN BOTH AEROBIC AND ANAEROBIC BOTTLES CRITICAL VALUE NOTED.  VALUE IS CONSISTENT WITH PREVIOUSLY REPORTED AND CALLED VALUE.    Culture (A)  Final    ENTEROCOCCUS FAECIUM SUSCEPTIBILITIES PERFORMED ON PREVIOUS CULTURE WITHIN THE LAST 5 DAYS. Performed at [facility], [place] [address] [place], [place] [address]    Report Status PENDING  Incomplete    Studies/Results: DG Abd Portable 1V  Result Date: 02/20/2022 CLINICAL DATA:  NG tube placement EXAM: PORTABLE ABDOMEN - 1 VIEW COMPARISON:  02/09/2022 FINDINGS: Esophageal tube tip  overlies the proximal stomach. There is a small amount of enteral contrast in the stomach and colon. IMPRESSION: Esophageal tube tip overlies the gastric fundus Electronically Signed   By: Donavan Foil M.D.   On: 02/20/2022 15:58   ECHOCARDIOGRAM COMPLETE  Result Date: 02/21/2022    ECHOCARDIOGRAM REPORT   Patient Name:   [name] Date of Exam: 02/21/2022 Medical Rec #:  [id number]       Height:       67.0 in Accession #:    [id number]      Weight:       121.7 lb Date of Birth:  November 20, 1940        BSA:          1.637 m Patient Age:    67 years        BP:           154/63 mmHg Patient Gender: M               HR:  105 bpm. Exam Location:  Inpatient Procedure: 2D Echo, Cardiac Doppler, Color Doppler and Strain Analysis Indications:    Bacteremia  History:        Patient has prior history of Echocardiogram examinations, most                 recent 10/23/2020. Risk Factors:Dyslipidemia, Hypertension and                 Diabetes.  Sonographer:    Luisa Hart RDCS Referring Phys: [place]  1. Strain abnormal -11.5 but poor tracking not reported EF is hyperdynamic.  2. Left ventricular ejection fraction, by estimation, is 60 to 65%. The left ventricle has normal function. The left ventricle has no regional wall motion abnormalities. Left ventricular diastolic parameters were normal.  3. Right ventricular systolic function is normal. The right ventricular size is normal.  4. The pericardial effusion is posterior to the left ventricle and anterior to the right ventricle.  5. The mitral valve is normal in structure. No evidence of mitral valve regurgitation. No evidence of mitral stenosis.  6. Nodular calcification of right coronary cusp likely sclerosis If suspicion of SBE high consider TEE. The aortic valve is normal in structure. There is moderate calcification of the aortic valve. There is moderate thickening of the aortic valve. Aortic valve regurgitation is not visualized.  Aortic valve sclerosis/calcification is present, without any evidence of aortic stenosis.  7. The inferior vena cava is normal in size with greater than 50% respiratory variability, suggesting right atrial pressure of 3 mmHg. FINDINGS  Left Ventricle: Left ventricular ejection fraction, by estimation, is 60 to 65%. The left ventricle has normal function. The left ventricle has no regional wall motion abnormalities. Global longitudinal strain performed but not reported based on interpreter judgement due to suboptimal tracking. The left ventricular internal cavity size was normal in size. There is no asymmetric left ventricular hypertrophy of the basal and septal segments. Left ventricular diastolic parameters were normal. Right Ventricle: The right ventricular size is normal. No increase in right ventricular wall thickness. Right ventricular systolic function is normal. Left Atrium: Left atrial size was normal in size. Right Atrium: Right atrial size was normal in size. Pericardium: Trivial pericardial effusion is present. The pericardial effusion is posterior to the left ventricle and anterior to the right ventricle. Mitral Valve: The mitral valve is normal in structure. No evidence of mitral valve regurgitation. No evidence of mitral valve stenosis. MV peak gradient, 4.1 mmHg. The mean mitral valve gradient is 2.0 mmHg. Tricuspid Valve: The tricuspid valve is normal in structure. Tricuspid valve regurgitation is mild . No evidence of tricuspid stenosis. Aortic Valve: Nodular calcification of right coronary cusp likely sclerosis If suspicion of SBE high consider TEE. The aortic valve is normal in structure. There is moderate calcification of the aortic valve. There is moderate thickening of the aortic valve. Aortic valve regurgitation is not visualized. Aortic valve sclerosis/calcification is present, without any evidence of aortic stenosis. Aortic valve mean gradient measures 3.0 mmHg. Aortic valve peak gradient  measures 6.2 mmHg. Aortic valve area, by VTI measures 2.20 cm. Pulmonic Valve: The pulmonic valve was normal in structure. Pulmonic valve regurgitation is not visualized. No evidence of pulmonic stenosis. Aorta: The aortic root is normal in size and structure. Venous: The inferior vena cava is normal in size with greater than 50% respiratory variability, suggesting right atrial pressure of 3 mmHg. IAS/Shunts: No atrial level shunt detected by color flow Doppler.  Additional Comments: Strain abnormal -11.5 but poor tracking not reported EF is hyperdynamic.  LEFT VENTRICLE PLAX 2D LVIDd:         2.60 cm     Diastology LVIDs:         1.10 cm     LV e' medial:    5.33 cm/s LV PW:         1.60 cm     LV E/e' medial:  11.2 LV IVS:        1.90 cm     LV e' lateral:   7.07 cm/s LVOT diam:     2.00 cm     LV E/e' lateral: 8.4 LV SV:         41 LV SV Index:   25 LVOT Area:     3.14 cm  LV Volumes (MOD) LV vol d, MOD A2C: 39.6 ml LV vol d, MOD A4C: 42.6 ml LV vol s, MOD A2C: 9.8 ml LV vol s, MOD A4C: 7.4 ml LV SV MOD A2C:     29.8 ml LV SV MOD A4C:     42.6 ml LV SV MOD BP:      33.2 ml RIGHT VENTRICLE RV Basal diam:  3.10 cm RV Mid diam:    2.20 cm RV S prime:     21.60 cm/s TAPSE (M-mode): 2.0 cm LEFT ATRIUM           Index        RIGHT ATRIUM          Index LA diam:      2.70 cm 1.65 cm/m   RA Area:     9.61 cm LA Vol (A4C): 27.6 ml 16.86 ml/m  RA Volume:   15.60 ml 9.53 ml/m  AORTIC VALVE                    PULMONIC VALVE AV Area (Vmax):    2.24 cm     PV Vmax:       0.98 m/s AV Area (Vmean):   2.09 cm     PV Vmean:      66.200 cm/s AV Area (VTI):     2.20 cm     PV VTI:        0.132 m AV Vmax:           124.00 cm/s  PV Peak grad:  3.8 mmHg AV Vmean:          86.300 cm/s  PV Mean grad:  2.0 mmHg AV VTI:            0.186 m AV Peak Grad:      6.2 mmHg AV Mean Grad:      3.0 mmHg LVOT Vmax:         88.60 cm/s LVOT Vmean:        57.400 cm/s LVOT VTI:          0.130 m LVOT/AV VTI ratio: 0.70  AORTA Ao Root diam: 3.10 cm  Ao Asc diam:  2.70 cm MITRAL VALVE               TRICUSPID VALVE MV Area (PHT): 4.49 cm    TR Peak grad:   29.8 mmHg MV Area VTI:   2.35 cm    TR Vmax:        273.00 cm/s MV Peak grad:  4.1 mmHg MV Mean grad:  2.0 mmHg    SHUNTS MV Vmax:  1.01 m/s    Systemic VTI:  0.13 m MV Vmean:      65.5 cm/s   Systemic Diam: 2.00 cm MV Decel Time: 169 msec MV E velocity: 59.60 cm/s MV A velocity: 88.60 cm/s MV E/A ratio:  0.67 Jenkins Rouge MD Electronically signed by Jenkins Rouge MD Signature Date/Time: 02/21/2022/11:07:24 AM    Final       Assessment/Plan:  INTERVAL HISTORY: Enterococcus faecium was sensitive to AMP   Principal Problem:   Bacteremia due to Enterococcus Active Problems:   Chronic lymphocytic leukemia (HCC)   Colon cancer (HCC)   Hypertension   Hypercholesterolemia   Diabetes mellitus (HCC)   Sepsis ([place])   Neutropenia in patient with hx of CLL   Protein-calorie malnutrition, severe (HCC)   Dehydration   Hyponatremia   Stage 3b chronic kidney disease (CKD) ([place])   Neutropenic fever ([place])   Refeeding syndrome   Hypophosphatemia    [name] is a 81 y.o. male with  CLL with recurrence in context of coming off and on Imbruvaca with neutropenia AMP S E faecium bacteremia, 2D echocardiogram showing area that could be endocarditis as well as a pericardial effusion  #1 ampicillin sensitive Enterococcus bacteremia rule out endocarditis  --Continue ampicillin --Follow-up repeat blood cultures --He will need TEE to better define area seen on aortic valve  #2 pericardial effusion: Again TEE could help define this better  #3 neutropenia due to Imbruvica slowly improving after colony-stimulating factor  #4 recurrent CLL: agree not going to be wanting him to get chemotherapy in context of bacteremia and possible endocarditis  #5 Goals of care: agree with palliative care consult     LOS: 2 days   Alcide Evener 02/22/2022, 3:41 PM

## 2022-02-22 NOTE — Progress Notes (Addendum)
PROGRESS NOTE  Nhia Heaphy BHA:[id number] DOB: Nov 15, 1940 DOA: 02/19/2022 PCP: [name], MD   LOS: 2 days   Brief Narrative / Interim history: 81 year old male with history of CLL initially diagnosed in 2014, status postchemotherapy and currently on Imbruvica, DM 2, HTN, colorectal cancer 2009, who came into the hospital with progressive and generalized weakness.  Patient has been experiencing some weight loss and poor appetite for the past several months, was seen by GI in February as an outpatient and underwent work-up without any significant major findings.  He was taken off Imbruvica in April as it was believed that it may be related to that, however he continued to decline.  He was hospitalized just 2 weeks ago and then again on 5/25 with failure to thrive.  When he left the hospital last time he was placed back on Imbruvica.  Repeat CT scan of the abdomen and pelvis on 5/25 shows recurrence of his CLL.  Oncology consulted  Subjective / 24h Interval events: Complains of mild shortness of breath this morning, and a persistent cough.  No abdominal pain, no nausea or vomiting.  No fever or chills  Assesement and Plan: Principal Problem:   Bacteremia due to Enterococcus Active Problems:   Neutropenia in patient with hx of CLL   Chronic lymphocytic leukemia (HCC)   Dehydration   Colon cancer (HCC)   Protein-calorie malnutrition, severe (HCC)   Hypertension   Hypercholesterolemia   Diabetes mellitus ([place])   Sepsis ([place])   Hyponatremia   Stage 3b chronic kidney disease (CKD) (HCC)   Neutropenic fever (HCC)   Refeeding syndrome   Hypophosphatemia  Principal problem Neutropenic fever, sepsis due to Enterococcus bacteremia-patient met sepsis criteria on admission with tachycardia, tachypnea, neutropenia.  He was not initially febrile but did develop a fever within his first 24 hours of being in the hospital.  Blood cultures were obtained and he was started on broad-spectrum  antibiotics with vancomycin and cefepime.  Cultures eventually speciated Enterococcus, and he was switched to ampicillin.  ID consulted.  2D echo done yesterday showed normal EF 60 to 65%, small pericardial effusion, there was also a nodular calcification in the right coronary cusp likely sclerosis but if high suspicion for SBE consider TEE.  Receiving Granix as well, remains neutropenic after several doses  Active problems Goals of care-overall difficult situation and complex case given progression of his CLL with progressive lymphadenopathy and now complicated by persistent neutropenia and Enterococcus bacteremia with concern for infectious endocarditis.  Discussed with the wife at bedside, she seems to have a good understanding of everything.  In addition, he is too weak to eat on his own and currently has NG tube for feeding which is not a permanent solution.  She wants to see how the next few days look like and will think about his feeding as well.  It would be good to discuss with Dr. Alen Blew when he comes back from the long weekend regarding what are the prospects from chemotherapy especially in the setting of newly discovered bacteremia, and based on oncologic options may need palliative care consultation  Refeeding syndrome with hypophosphatemia-replete phosphorus, closely monitor  Profound dehydration, failure to thrive, dysphagia-patient with progressive weakness, poor p.o. intake and worsening dehydration.  Unfortunately due to weakness he failed swallow eval on 5/26, and failed an MBS.  Dobbhoff was placed 5/26 and he was started on tube feeds.  Seems to be tolerating well.  Continue.  High risk of aspiration  Chronic kidney disease stage  IIIb-baseline creatinine around 1.4-1.6, currently slightly up at 1.7, closely monitor, may need to supplement with IV fluids if trend continues up  Hypokalemia-replete and recheck in the morning  Hypomagnesemia-replete and recheck in the morning   CLL  -initially believed to be in remission but CT scan done on 5/25 shows recurrent disease.  Oncology consulted and following   Hyponatremia-sodium slightly lower at 129 today.  Monitor   Sinus tachycardia-likely due to dehydration, but also active malignancy   Essential hypertension-hypertensive on admission, resumed home metoprolol.  Hold lisinopril in the setting of his CKD.  More hypertensive, Norvasc resumed 9/36.  Blood pressure more acceptable today  Hyperlipidemia-continue statin  Anemia-likely due to underlying malignancy, no bleeding, continue to monitor hemoglobin.  8.6 this morning  Pancytopenia-due to recurrent malignancy.  Receiving Granix.  Antibiotics for neutropenic fever.  His platelets are lower today.   Type 2 diabetes mellitus-hyperglycemic initially on BMP with sugar of 327, improved now.  Continue sliding scale   Severe protein calorie malnutrition-consult RD.  Marinol as above.      Scheduled Meds:  amLODipine  2.5 mg Per NG tube Daily   aspirin  81 mg Per Tube Daily   atorvastatin  40 mg Per Tube Daily   chlorhexidine  15 mL Mouth Rinse BID   Chlorhexidine Gluconate Cloth  6 each Topical Daily   feeding supplement  237 mL Oral TID BM   feeding supplement (PROSource TF)  45 mL Per Tube BID   free water  100 mL Per Tube Q4H   heparin  5,000 Units Subcutaneous Q8H   insulin aspart  0-5 Units Subcutaneous QHS   insulin aspart  0-9 Units Subcutaneous TID WC   mouth rinse  15 mL Mouth Rinse BID   metoprolol tartrate  25 mg Per Tube BID   mirtazapine  7.5 mg Per Tube QHS   multivitamin with minerals  1 tablet Per Tube Daily   ondansetron  4 mg Per Tube Q8H   Or   ondansetron (ZOFRAN) IV  4 mg Intravenous Q8H   pantoprazole sodium  40 mg Per Tube BID   Tbo-filgastrim (GRANIX) SQ  300 mcg Subcutaneous q1800   thiamine  100 mg Per Tube Daily   Continuous Infusions:  sodium chloride Stopped (02/20/22 0850)   ampicillin (OMNIPEN) IV 2 g (02/22/22 0338)   feeding  supplement (OSMOLITE 1.5 CAL) 35 mL/hr at 02/21/22 1711   lactated ringers 50 mL/hr at 02/21/22 1709   sodium phosphate  Dextrose 5% IVPB     PRN Meds:.sodium chloride, acetaminophen, guaiFENesin-dextromethorphan, labetalol, prochlorperazine     DVT prophylaxis: heparin injection 5,000 Units Start: 02/19/22 1430   Lab Results  Component Value Date   PLT 125 (L) 02/22/2022      Code Status: DNR  Family Communication: Wife at bedside  Status is: Inpatient   Level of care: Progressive  Consultants:  Oncology Infectious disease  Procedures:  Blood cultures 5/26-Enterococcus  Antimicrobials Vancomycin 5/26 >> 5/27 Cefepime 5/26 >> 5/27 Ampicillin 5/27 >>   Objective: Vitals:   02/21/22 2135 02/22/22 0022 02/22/22 0420 02/22/22 0424  BP:  126/64 (!) 131/50   Pulse: 97 (!) 109 (!) 109   Resp:  20 20   Temp:  99.2 F (37.3 C) 98.2 F (36.8 C)   TempSrc:  Oral Oral   SpO2:  94% 96%   Weight:    55.8 kg  Height:        Intake/Output Summary (Last 24 hours) at 02/22/2022  [place] filed at 02/22/2022 0600 Gross per 24 hour  Intake 2713.64 ml  Output 1000 ml  Net 1713.64 ml   Wt Readings from Last 3 Encounters:  02/22/22 55.8 kg  02/09/22 52.8 kg  01/12/22 54.9 kg    Examination:  Constitutional: NAD Eyes: lids and conjunctivae normal, no scleral icterus ENMT: mmm Neck: normal, supple Respiratory: clear to auscultation bilaterally, no wheezing, no crackles. Normal respiratory effort.  Cardiovascular: Regular rate and rhythm, no murmurs / rubs / gallops. No LE edema. Abdomen: soft, no distention, no tenderness. Bowel sounds positive.  Skin: no rashes Neurologic: no focal deficits, equal strength   Data Reviewed: I have independently reviewed following labs and imaging studies   CBC Recent Labs  Lab 02/19/22 0235 02/20/22 0310 02/21/22 0632 02/22/22 0637  WBC 1.2* 0.9* 0.9* 1.0*  HGB 9.4* 8.6* 8.8* 8.6*  HCT 28.3* 26.1* 26.8* 27.1*  PLT  195 148* 151 125*  MCV 79.5* 81.8 83.0 84.4  MCH 26.4 27.0 27.2 26.8  MCHC 33.2 33.0 32.8 31.7  RDW 16.7* 17.0* 17.6* 17.9*  LYMPHSABS 1.0  --  0.5* 0.7  MONOABS 0.1  --  0.1 0.1  EOSABS 0.0  --  0.0 0.0  BASOSABS 0.0  --  0.0 0.0    Recent Labs  Lab 02/19/22 0235 02/19/22 0927 02/20/22 0310 02/21/22 0632 02/21/22 1657 02/22/22 0637  NA 131*  --  132* 131*  --  129*  K 4.1  --  3.7 3.4*  --  3.8  CL 96*  --  101 100  --  100  CO2 22  --  22 23  --  22  GLUCOSE 327*  --  208* 247*  --  323*  BUN 41*  --  34* 36*  --  36*  CREATININE 1.74*  --  1.63* 1.54*  --  1.71*  CALCIUM 9.1  --  8.0* 7.7*  --  7.3*  AST 10*  --  18 31  --  32  ALT 10  --  15 26  --  29  ALKPHOS 103  --  79 78  --  82  BILITOT 0.9  --  1.1 0.8  --  0.6  ALBUMIN 3.5  --  2.4* 2.2*  --  1.9*  MG  --   --   --  1.6* 1.9 1.8  LATICACIDVEN 3.4* 1.8  --   --   --   --     ------------------------------------------------------------------------------------------------------------------ No results for input(s): CHOL, HDL, LDLCALC, TRIG, CHOLHDL, LDLDIRECT in the last 72 hours.  Lab Results  Component Value Date   HGBA1C 6.9 (H) 02/09/2022   ------------------------------------------------------------------------------------------------------------------ No results for input(s): TSH, T4TOTAL, T3FREE, THYROIDAB in the last 72 hours.  Invalid input(s): FREET3  Cardiac Enzymes No results for input(s): CKMB, TROPONINI, MYOGLOBIN in the last 168 hours.  Invalid input(s): CK ------------------------------------------------------------------------------------------------------------------ No results found for: BNP  CBG: Recent Labs  Lab 02/21/22 1707 02/21/22 2004 02/22/22 0019 02/22/22 0418 02/22/22 0723  GLUCAP 207* 227* 240* 270* 308*    Recent Results (from the past 240 hour(s))  Culture, blood (Routine X 2) w Reflex to ID Panel     Status: Abnormal   Collection Time: 02/20/22  7:50 AM    Specimen: BLOOD  Result Value Ref Range Status   Specimen Description   Final    BLOOD LEFT ANTECUBITAL Performed at [facility], [place] [address] [place], [place] [address]    Special Requests  Final    BOTTLES DRAWN AEROBIC AND ANAEROBIC Blood Culture adequate volume Performed at [place] [address] [place], [place] [address]    Culture  Setup Time   Final    GRAM POSITIVE COCCI IN BOTH AEROBIC AND ANAEROBIC BOTTLES CRITICAL RESULT CALLED TO, READ BACK BY AND VERIFIED WITH: E JACKSON,PHARMD@0200  02/21/22 [place] Performed at [facility], [place] [address], [place], [place] [address]    Culture ENTEROCOCCUS FAECIUM (A)  Final   Report Status 02/22/2022 FINAL  Final   Organism ID, Bacteria ENTEROCOCCUS FAECIUM  Final      Susceptibility   Enterococcus faecium - MIC*    AMPICILLIN <=2 SENSITIVE Sensitive     VANCOMYCIN <=0.5 SENSITIVE Sensitive     GENTAMICIN SYNERGY SENSITIVE Sensitive     * ENTEROCOCCUS FAECIUM  Blood Culture ID Panel (Reflexed)     Status: Abnormal   Collection Time: 02/20/22  7:50 AM  Result Value Ref Range Status   Enterococcus faecalis NOT DETECTED NOT DETECTED Final   Enterococcus Faecium DETECTED (A) NOT DETECTED Final    Comment: CRITICAL RESULT CALLED TO, READ BACK BY AND VERIFIED WITH: E JACKSON,PHARMD@0200  02/21/22 [place]    Listeria monocytogenes NOT DETECTED NOT DETECTED Final   Staphylococcus species NOT DETECTED NOT DETECTED Final   Staphylococcus aureus (BCID) NOT DETECTED NOT DETECTED Final   Staphylococcus epidermidis NOT DETECTED NOT DETECTED Final   Staphylococcus lugdunensis NOT DETECTED NOT DETECTED Final   Streptococcus species NOT DETECTED NOT DETECTED Final   Streptococcus agalactiae NOT DETECTED NOT DETECTED Final   Streptococcus pneumoniae NOT DETECTED NOT DETECTED Final   Streptococcus pyogenes NOT DETECTED NOT DETECTED Final   A.calcoaceticus-baumannii NOT DETECTED NOT DETECTED Final    Bacteroides fragilis NOT DETECTED NOT DETECTED Final   Enterobacterales NOT DETECTED NOT DETECTED Final   Enterobacter cloacae complex NOT DETECTED NOT DETECTED Final   Escherichia coli NOT DETECTED NOT DETECTED Final   Klebsiella aerogenes NOT DETECTED NOT DETECTED Final   Klebsiella oxytoca NOT DETECTED NOT DETECTED Final   Klebsiella pneumoniae NOT DETECTED NOT DETECTED Final   Proteus species NOT DETECTED NOT DETECTED Final   Salmonella species NOT DETECTED NOT DETECTED Final   Serratia marcescens NOT DETECTED NOT DETECTED Final   Haemophilus influenzae NOT DETECTED NOT DETECTED Final   Neisseria meningitidis NOT DETECTED NOT DETECTED Final   Pseudomonas aeruginosa NOT DETECTED NOT DETECTED Final   Stenotrophomonas maltophilia NOT DETECTED NOT DETECTED Final   Candida albicans NOT DETECTED NOT DETECTED Final   Candida auris NOT DETECTED NOT DETECTED Final   Candida glabrata NOT DETECTED NOT DETECTED Final   Candida krusei NOT DETECTED NOT DETECTED Final   Candida parapsilosis NOT DETECTED NOT DETECTED Final   Candida tropicalis NOT DETECTED NOT DETECTED Final   Cryptococcus neoformans/gattii NOT DETECTED NOT DETECTED Final   Vancomycin resistance NOT DETECTED NOT DETECTED Final    Comment: Performed at [facility], [place] [address] [place], [place] [address]  Culture, blood (Routine X 2) w Reflex to ID Panel     Status: None (Preliminary result)   Collection Time: 02/20/22  7:54 AM   Specimen: BLOOD  Result Value Ref Range Status   Specimen Description   Final    BLOOD BLOOD RIGHT FOREARM Performed at [place] [address], [place], [place] [address]    Special Requests   Final    BOTTLES DRAWN AEROBIC AND ANAEROBIC Blood Culture adequate volume Performed at [facility]  [facility], [place] [address], [place], [place] [address]    Culture  Setup Time   Final    GRAM POSITIVE COCCI IN BOTH AEROBIC AND ANAEROBIC BOTTLES CRITICAL VALUE  NOTED.  VALUE IS CONSISTENT WITH PREVIOUSLY REPORTED AND CALLED VALUE. Performed at [facility], [place] [address] [place], [place] [address]    Culture GRAM POSITIVE COCCI  Final   Report Status PENDING  Incomplete     Radiology Studies: No results found.   Marzetta Board, MD, PhD [facility]  Between 7 am - 7 pm I am available, please contact me via Amion (for emergencies) or Securechat (non urgent messages)  Between 7 pm - 7 am I am not available, please contact night coverage MD/APP via Amion

## 2022-02-22 NOTE — Progress Notes (Deleted)
Patient's family had voiced some concerns about the patient to the Administrative Coordinator during the shift.

## 2022-02-23 ENCOUNTER — Inpatient Hospital Stay (HOSPITAL_COMMUNITY): Payer: Medicare Other

## 2022-02-23 DIAGNOSIS — R7881 Bacteremia: Secondary | ICD-10-CM | POA: Diagnosis not present

## 2022-02-23 DIAGNOSIS — T451X5A Adverse effect of antineoplastic and immunosuppressive drugs, initial encounter: Secondary | ICD-10-CM | POA: Diagnosis not present

## 2022-02-23 DIAGNOSIS — C9110 Chronic lymphocytic leukemia of B-cell type not having achieved remission: Secondary | ICD-10-CM | POA: Diagnosis not present

## 2022-02-23 DIAGNOSIS — D696 Thrombocytopenia, unspecified: Secondary | ICD-10-CM

## 2022-02-23 DIAGNOSIS — B952 Enterococcus as the cause of diseases classified elsewhere: Secondary | ICD-10-CM | POA: Diagnosis not present

## 2022-02-23 DIAGNOSIS — D701 Agranulocytosis secondary to cancer chemotherapy: Secondary | ICD-10-CM | POA: Diagnosis not present

## 2022-02-23 DIAGNOSIS — D709 Neutropenia, unspecified: Secondary | ICD-10-CM | POA: Diagnosis not present

## 2022-02-23 DIAGNOSIS — N1832 Chronic kidney disease, stage 3b: Secondary | ICD-10-CM | POA: Diagnosis not present

## 2022-02-23 DIAGNOSIS — E860 Dehydration: Secondary | ICD-10-CM | POA: Diagnosis not present

## 2022-02-23 LAB — MAGNESIUM
Magnesium: 1.9 mg/dL (ref 1.7–2.4)
Magnesium: 1.9 mg/dL (ref 1.7–2.4)

## 2022-02-23 LAB — CBC WITH DIFFERENTIAL/PLATELET
Abs Immature Granulocytes: 0.12 10*3/uL — ABNORMAL HIGH (ref 0.00–0.07)
Basophils Absolute: 0 10*3/uL (ref 0.0–0.1)
Basophils Relative: 1 %
Eosinophils Absolute: 0 10*3/uL (ref 0.0–0.5)
Eosinophils Relative: 1 %
HCT: 26.7 % — ABNORMAL LOW (ref 39.0–52.0)
Hemoglobin: 8.4 g/dL — ABNORMAL LOW (ref 13.0–17.0)
Immature Granulocytes: 8 %
Lymphocytes Relative: 51 %
Lymphs Abs: 0.8 10*3/uL (ref 0.7–4.0)
MCH: 27 pg (ref 26.0–34.0)
MCHC: 31.5 g/dL (ref 30.0–36.0)
MCV: 85.9 fL (ref 80.0–100.0)
Monocytes Absolute: 0.1 10*3/uL (ref 0.1–1.0)
Monocytes Relative: 5 %
Neutro Abs: 0.5 10*3/uL — ABNORMAL LOW (ref 1.7–7.7)
Neutrophils Relative %: 34 %
Platelets: 127 10*3/uL — ABNORMAL LOW (ref 150–400)
RBC: 3.11 MIL/uL — ABNORMAL LOW (ref 4.22–5.81)
RDW: 18.3 % — ABNORMAL HIGH (ref 11.5–15.5)
WBC: 1.6 10*3/uL — ABNORMAL LOW (ref 4.0–10.5)
nRBC: 0 % (ref 0.0–0.2)

## 2022-02-23 LAB — COMPREHENSIVE METABOLIC PANEL
ALT: 31 U/L (ref 0–44)
AST: 29 U/L (ref 15–41)
Albumin: 2 g/dL — ABNORMAL LOW (ref 3.5–5.0)
Alkaline Phosphatase: 79 U/L (ref 38–126)
Anion gap: 7 (ref 5–15)
BUN: 48 mg/dL — ABNORMAL HIGH (ref 8–23)
CO2: 23 mmol/L (ref 22–32)
Calcium: 7.2 mg/dL — ABNORMAL LOW (ref 8.9–10.3)
Chloride: 104 mmol/L (ref 98–111)
Creatinine, Ser: 2.06 mg/dL — ABNORMAL HIGH (ref 0.61–1.24)
GFR, Estimated: 32 mL/min — ABNORMAL LOW (ref >60)
Glucose, Bld: 353 mg/dL — ABNORMAL HIGH (ref 70–99)
Potassium: 3.8 mmol/L (ref 3.5–5.1)
Sodium: 134 mmol/L — ABNORMAL LOW (ref 135–145)
Total Bilirubin: 0.6 mg/dL (ref 0.3–1.2)
Total Protein: 4.2 g/dL — ABNORMAL LOW (ref 6.5–8.1)

## 2022-02-23 LAB — CULTURE, BLOOD (ROUTINE X 2): Special Requests: ADEQUATE

## 2022-02-23 LAB — GLUCOSE, CAPILLARY
Glucose-Capillary: 290 mg/dL — ABNORMAL HIGH (ref 70–99)
Glucose-Capillary: 298 mg/dL — ABNORMAL HIGH (ref 70–99)
Glucose-Capillary: 305 mg/dL — ABNORMAL HIGH (ref 70–99)
Glucose-Capillary: 308 mg/dL — ABNORMAL HIGH (ref 70–99)
Glucose-Capillary: 378 mg/dL — ABNORMAL HIGH (ref 70–99)

## 2022-02-23 LAB — PHOSPHORUS
Phosphorus: 3.4 mg/dL (ref 2.5–4.6)
Phosphorus: 2.7 mg/dL (ref 2.5–4.6)

## 2022-02-23 LAB — ZINC: Zinc: 27 ug/dL — ABNORMAL LOW (ref 44–115)

## 2022-02-23 MED ORDER — SODIUM CHLORIDE 0.9 % IV SOLN: INTRAVENOUS | Status: AC

## 2022-02-23 MED ORDER — SODIUM CHLORIDE 0.9 % IV SOLN
2.0000 g | Freq: Three times a day (TID) | INTRAVENOUS | Status: AC
  Administered 2022-02-23 – 2022-03-07 (×37): 2 g via INTRAVENOUS
  Filled 2022-02-23 (×3): qty 2000
  Filled 2022-02-23: qty 2
  Filled 2022-02-23 (×8): qty 2000
  Filled 2022-02-23: qty 2
  Filled 2022-02-23 (×2): qty 2000
  Filled 2022-02-23 (×2): qty 2
  Filled 2022-02-23 (×10): qty 2000
  Filled 2022-02-23: qty 2
  Filled 2022-02-23 (×8): qty 2000
  Filled 2022-02-23 (×2): qty 2

## 2022-02-23 MED ORDER — GUAIFENESIN-DM 100-10 MG/5ML PO SYRP
5.0000 mL | ORAL_SOLUTION | Freq: Three times a day (TID) | ORAL | Status: DC
Start: 2022-02-23 — End: 2022-03-03
  Administered 2022-02-23 – 2022-03-02 (×17): 5 mL
  Filled 2022-02-23 (×17): qty 10

## 2022-02-23 MED ORDER — INSULIN ASPART 100 UNIT/ML IJ SOLN
0.0000 [IU] | Freq: Four times a day (QID) | INTRAMUSCULAR | Status: DC
  Administered 2022-02-23 – 2022-02-24 (×4): 7 [IU] via SUBCUTANEOUS

## 2022-02-23 MED ORDER — INSULIN GLARGINE-YFGN 100 UNIT/ML ~~LOC~~ SOLN
12.0000 [IU] | Freq: Every day | SUBCUTANEOUS | Status: DC
Start: 2022-02-23 — End: 2022-02-25
  Administered 2022-02-23 – 2022-02-25 (×3): 12 [IU] via SUBCUTANEOUS
  Filled 2022-02-23 (×3): qty 0.12

## 2022-02-23 NOTE — TOC Initial Note (Signed)
Transition of Care [facility]) - Initial/Assessment Note    Patient Details  Name: [name] MRN: [id number] Date of Birth: 1941/07/29  Transition of Care [facility]) CM/SW Contact:    Leeroy Cha, RN Phone Number: 02/23/2022, 7:40 AM  Clinical Narrative:                  Transition of Care [facility]) Screening Note   Patient Details  Name: [name] Date of Birth: [birth year]   Transition of Care [facility]) CM/SW Contact:    Leeroy Cha, RN Phone Number: 02/23/2022, 7:40 AM    Transition of Care Department [facility]) has reviewed patient and no TOC needs have been identified at this time. We will continue to monitor patient advancement through interdisciplinary progression rounds. If new patient transition needs arise, please place a TOC consult.    Expected Discharge Plan: Home/Self Care Barriers to Discharge: Continued Medical Work up   Patient Goals and CMS Choice Patient states their goals for this hospitalization and ongoing recovery are:: to go home CMS Medicare.gov Compare Post Acute Care list provided to:: Patient Choice offered to / list presented to : Spouse, Patient  Expected Discharge Plan and Services Expected Discharge Plan: Home/Self Care   Discharge Planning Services: CM Consult   Living arrangements for the past 2 months: Single Family Home                                      Prior Living Arrangements/Services Living arrangements for the past 2 months: Single Family Home Lives with:: Spouse Patient language and need for interpreter reviewed:: Yes Do you feel safe going back to the place where you live?: Yes            Criminal Activity/Legal Involvement Pertinent to Current Situation/Hospitalization: No - Comment as needed  Activities of Daily Living Home Assistive Devices/Equipment: None ADL Screening (condition at time of admission) Patient's cognitive ability adequate to safely complete daily activities?: Yes Is the patient deaf  or have difficulty hearing?: No Does the patient have difficulty seeing, even when wearing glasses/contacts?: No Does the patient have difficulty concentrating, remembering, or making decisions?: No Patient able to express need for assistance with ADLs?: Yes Does the patient have difficulty dressing or bathing?: No Independently performs ADLs?: Yes (appropriate for developmental age) Does the patient have difficulty walking or climbing stairs?: No Weakness of Legs: Right Weakness of Arms/Hands: Both  Permission Sought/Granted                  Emotional Assessment Appearance:: Appears stated age     Orientation: : Oriented to Self, Oriented to Place, Oriented to  Time, Oriented to Situation Alcohol / Substance Use: Not Applicable Psych Involvement: No (comment)  Admission diagnosis:  Dehydration [E86.0] CLL (chronic lymphocytic leukemia) ([place]) [C91.10] Hyperglycemia [R73.9] Failure to thrive in adult [R62.7] Chemotherapy-induced neutropenia ([place]) [D70.1, T45.1X5A] Neutropenic fever ([place]) [D70.9, R50.81] Patient Active Problem List   Diagnosis Date Noted   Refeeding syndrome 02/22/2022   Hypophosphatemia 02/22/2022   Bacteremia due to Enterococcus 02/22/2022   CLL (chronic lymphocytic leukemia) ([place])    Failure to thrive in adult    Neutropenic fever ([place]) 02/20/2022   Hyponatremia 02/19/2022   Stage 3b chronic kidney disease (CKD) ([place]) 02/19/2022   Physical deconditioning 02/10/2022   Goals of care, counseling/discussion 02/10/2022   Nausea, vomiting and diarrhea 02/10/2022   Diarrhea 02/10/2022   Sepsis (  [place]) 02/09/2022   Pancytopenia ([place]) 02/09/2022   Neutropenia in patient with hx of CLL 02/09/2022   Protein-calorie malnutrition, severe ([place]) 02/09/2022   AKI on DKD-3B 02/09/2022   Dehydration 02/09/2022   Pneumonia due to COVID-19 virus 10/22/2020   SIRS (systemic inflammatory response syndrome) ([place]) 03/25/2019   Hypertensive urgency 03/25/2019    Thrombocytopenia ([place]) 03/25/2019   Influenza A 09/18/2013   Syncope 09/17/2013   Diabetes mellitus ([place]) 09/17/2013   Hypertension    Hypercholesterolemia    Chronic lymphocytic leukemia ([place]) 08/01/2011   Colon cancer ([place]) 08/01/2011   PCP:  [name], MD Pharmacy:   [id number] [id number]: [phone]: [id number]: [phone]: [phone]    Social Determinants of Health (SDOH) Interventions    Readmission Risk Interventions     View : No data to display.

## 2022-02-23 NOTE — Progress Notes (Signed)
Speech Language Pathology Treatment: Dysphagia  Patient Details Name: [name] MRN: [id number] DOB: December 19, 1940 Today's Date: 02/23/2022 Time: 1535-1600 SLP Time Calculation (min) (ACUTE ONLY): 25 min  Assessment / Plan / Recommendation Clinical Impression  Patient seen for skilled SLP to address dysphagia goals - including establishing more premorbid information.  Pt alert with tube feeding running. Helped pt to reposition fully upright in bed for optimal positioning. After lying pt flat for repositioning, pt coughed and expectorated frothy secretions.  Reviewed importance of oral care due to pt being NPO and suspected secretion aspiration. Set up oral suction and assisted pt with dental brushing.  Voice is weak and gurgly at times - cues to clear his throat and re-swallow or cough and expectorate effective perceptually.  Pt admits his voice is weaker than normal.  Provided pt with single ice chips - Pt reported sensing something in throat after ice chips - but reports it was no worse than baseline.  Post-swallow cough and/or wet voice observed with approx 80% of trials. Pt and wife deny pt having signficant improvement since his TF has begun - and pt admits to becoming weaker. Recommend consider PT/OT for general conditioning.  Do not recommend repeat MBS at this time as do not anticipate functional improvement will be noted.  Swallow precaution signs provided using teach back to pt and his wife.  Recommend single ice chips after oral care with strict precautions.    HPI        SLP Plan  Continue with current plan of care      Recommendations for follow up therapy are one component of a multi-disciplinary discharge planning process, led by the attending physician.  Recommendations may be updated based on patient status, additional functional criteria and insurance authorization.    Recommendations  Diet recommendations: NPO (ice chips) Medication Administration: Via alternative  means Supervision: Trained caregiver to feed patient Postural Changes and/or Swallow Maneuvers: Seated upright 90 degrees;Upright 30-60 min after meal                Follow Up Recommendations: Home health SLP Assistance recommended at discharge: Frequent or constant Supervision/Assistance SLP Visit Diagnosis: Dysphagia, oropharyngeal phase (R13.12) Plan: Continue with current plan of care           [name]  02/23/2022, 5:20 PM Kathleen Lime, MS [place] Office [phone] Pager [phone]

## 2022-02-23 NOTE — Progress Notes (Signed)
Speech Language Pathology Treatment: Dysphagia  Patient Details Name: [name] MRN: [id number] DOB: [birth year] Today's Date: 02/23/2022 Time: 9798-9211 SLP Time Calculation (min) (ACUTE ONLY): 15 min  Assessment / Plan / Recommendation Clinical Impression  Session dedicated to focus on dysphagia goals including establishing baseline function both swallow/physical.   Pt's wife provided most premorbid information as she stated "you'll need to ask me".  She reports pt recently being in the hospital and discharged home.  Dry cough noted approximately 17th/18th - for which she took pt to MD. Pt was given medications to tx his cough.  Wife reports that pt then developed significant congestion and on May 23rd - his cough was accompained by frothy secretions.  She reports he would have an ongoing cough for 15 minutes at a time- excessive.  Wife then noted pt coughing associated with po intake..  She also indicates pt with frequent hiccups over the last month.  Progressive weight loss noted overt the last 2 years - since pt with recurrence of CLL.  Pt also had COVID -which also contributed to cough.    Suspect some component of subtle chronic dysphagia with significant current exacerbation.    Pt does not have significant functional improvement with swallowing based on clinical treatment coupled with MBS results and at this time do not recommend repeat MBS.  Anticipate if significant dysphagia continues, he may benefit from longer term alternative means of nutrition if this align with pt's goals *aggressive treatment*.  Educated wife and pt that feeding tubes do NOT prevent aspiration but provide nutrition.    Importance of oral care again reiterated.   Wife and pt reported understanding to information provided.    HPI HPI: 82yo male admitted 02/19/22 with generalized weakness, recurrent falls. PMH: CLL, DM2, HTN, colorectal cancer (2009), HLD. Recent hospitalization due to poor appetite/intake, FTT.       SLP Plan  Continue with current plan of care      Recommendations for follow up therapy are one component of a multi-disciplinary discharge planning process, led by the attending physician.  Recommendations may be updated based on patient status, additional functional criteria and insurance authorization.    Recommendations  Diet recommendations:  (ice chips) Medication Administration: Via alternative means Supervision: Trained caregiver to feed patient Compensations: Slow rate (with ICE ONLY) Postural Changes and/or Swallow Maneuvers:  (fully upright for ice and afterward)                Follow Up Recommendations: [place] recommended at discharge: Frequent or constant Supervision/Assistance SLP Visit Diagnosis: Dysphagia, oropharyngeal phase (R13.12) Plan: Continue with current plan of care         Edward Lime, MS [place] Office [phone] Pager [phone]   [name]  02/23/2022, 5:32 PM

## 2022-02-23 NOTE — Progress Notes (Signed)
Subjective: He is feeling better  Antibiotics:  Anti-infectives (From admission, onward)    Start     Dose/Rate Route Frequency Ordered Stop   02/23/22 1900  ampicillin (OMNIPEN) 2 g in sodium chloride 0.9 % 100 mL IVPB        2 g 300 mL/hr over 20 Minutes Intravenous Every 8 hours 02/23/22 1059     02/21/22 1000  ampicillin (OMNIPEN) 2 g in sodium chloride 0.9 % 100 mL IVPB  Status:  Discontinued        2 g 300 mL/hr over 20 Minutes Intravenous Every 6 hours 02/21/22 0940 02/23/22 1059   02/21/22 0300  ampicillin (OMNIPEN) 2 g in sodium chloride 0.9 % 100 mL IVPB  Status:  Discontinued        2 g 300 mL/hr over 20 Minutes Intravenous Every 8 hours 02/21/22 0215 02/21/22 0940   02/20/22 0815  vancomycin (VANCOREADY) IVPB 1250 mg/250 mL  Status:  Discontinued        1,250 mg 166.7 mL/hr over 90 Minutes Intravenous Every 48 hours 02/20/22 0715 02/21/22 0215   02/20/22 0800  ceFEPIme (MAXIPIME) 2 g in sodium chloride 0.9 % 100 mL IVPB  Status:  Discontinued        2 g 200 mL/hr over 30 Minutes Intravenous Every 24 hours 02/20/22 0643 02/21/22 0215   02/20/22 0800  vancomycin (VANCOREADY) IVPB 1750 mg/350 mL  Status:  Discontinued        1,750 mg 175 mL/hr over 120 Minutes Intravenous Every 48 hours 02/20/22 0703 02/20/22 0715       Medications: Scheduled Meds:  amLODipine  2.5 mg Per NG tube Daily   aspirin  81 mg Per Tube Daily   atorvastatin  40 mg Per Tube Daily   chlorhexidine  15 mL Mouth Rinse BID   feeding supplement (PROSource TF)  45 mL Per Tube BID   free water  100 mL Per Tube Q4H   guaiFENesin-dextromethorphan  5 mL Per Tube Q8H   heparin  5,000 Units Subcutaneous Q8H   insulin aspart  0-9 Units Subcutaneous Q6H   insulin glargine-yfgn  12 Units Subcutaneous Daily   mouth rinse  15 mL Mouth Rinse BID   metoprolol tartrate  25 mg Per Tube BID   mirtazapine  7.5 mg Per Tube QHS   multivitamin with minerals  1 tablet Per Tube Daily   ondansetron  4 mg  Per Tube Q8H   Or   ondansetron (ZOFRAN) IV  4 mg Intravenous Q8H   pantoprazole sodium  40 mg Per Tube BID   Tbo-filgastrim (GRANIX) SQ  300 mcg Subcutaneous q1800   thiamine  100 mg Per Tube Daily   Continuous Infusions:  sodium chloride Stopped (02/20/22 0850)   sodium chloride 75 mL/hr at 02/23/22 0821   ampicillin (OMNIPEN) IV     feeding supplement (OSMOLITE 1.5 CAL) 1,000 mL (02/23/22 0904)   PRN Meds:.sodium chloride, acetaminophen, labetalol, prochlorperazine    Objective: Weight change:   Intake/Output Summary (Last 24 hours) at 02/23/2022 1539 Last data filed at 02/23/2022 1300 Gross per 24 hour  Intake 3866.61 ml  Output 650 ml  Net 3216.61 ml    Blood pressure (!) 116/45, pulse 98, temperature 98.7 F (37.1 C), temperature source Oral, resp. rate 16, height '5\' 7"'$  (1.702 m), weight 55.8 kg, SpO2 96 %. Temp:  [98.1 F (36.7 C)-100.1 F (37.8 C)] 98.7 F (37.1 C) (05/29 1411) Pulse Rate:  [98-123]  98 (05/29 1411) Resp:  [16-20] 16 (05/29 1411) BP: (116-144)/(45-58) 116/45 (05/29 1411) SpO2:  [95 %-98 %] 96 % (05/29 1411)  Physical Exam: Physical Exam HENT:     Head: Normocephalic and atraumatic.  Cardiovascular:     Heart sounds: No murmur heard.   No friction rub. No gallop.  Pulmonary:     Effort: Pulmonary effort is normal. No respiratory distress.     Breath sounds: No stridor. No wheezing, rhonchi or rales.  Abdominal:     General: Abdomen is flat. Bowel sounds are normal. There is no distension.  Musculoskeletal:     Cervical back: Normal range of motion.  Neurological:     General: No focal deficit present.     Mental Status: He is alert and oriented to person, place, and time.  Psychiatric:        Mood and Affect: Mood normal.        Behavior: Behavior normal.        Thought Content: Thought content normal.        Judgment: Judgment normal.     CBC:    BMET Recent Labs    02/22/22 0637 02/23/22 0542  NA 129* 134*  K 3.8 3.8  CL  100 104  CO2 22 23  GLUCOSE 323* 353*  BUN 36* 48*  CREATININE 1.71* 2.06*  CALCIUM 7.3* 7.2*      Liver Panel  Recent Labs    02/22/22 0637 02/23/22 0542  PROT 4.4* 4.2*  ALBUMIN 1.9* 2.0*  AST 32 29  ALT 29 31  ALKPHOS 82 79  BILITOT 0.6 0.6        Sedimentation Rate No results for input(s): ESRSEDRATE in the last 72 hours. C-Reactive Protein No results for input(s): CRP in the last 72 hours.  Micro Results: Recent Results (from the past 720 hour(s))  Culture, blood (Routine x 2)     Status: None   Collection Time: 02/09/22 10:23 AM   Specimen: Right Antecubital; Blood  Result Value Ref Range Status   Specimen Description   Final    RIGHT ANTECUBITAL Performed at [facility], [address] [place], [place] [address]    Special Requests   Final    BOTTLES DRAWN AEROBIC AND ANAEROBIC Blood Culture adequate volume Performed at [facility], [address] [place], [place] [address]    Culture   Final    NO GROWTH 5 DAYS Performed at [facility], [place] [address], [place], [place] [address]    Report Status 02/14/2022 FINAL  Final  Culture, blood (Routine x 2)     Status: None   Collection Time: 02/09/22 10:23 AM   Specimen: BLOOD LEFT FOREARM  Result Value Ref Range Status   Specimen Description   Final    BLOOD LEFT FOREARM Performed at [facility], [address] [place], [place] [address]    Special Requests   Final    BOTTLES DRAWN AEROBIC AND ANAEROBIC Blood Culture adequate volume Performed at [facility], [address], [place], [place] [address]    Culture   Final    NO GROWTH 5 DAYS Performed at [facility], [place] [address], [place], [place] [address]    Report Status 02/14/2022 FINAL  Final  Urine Culture     Status: None   Collection Time: 02/09/22 10:23 AM   Specimen: Urine, Clean Catch  Result Value Ref Range Status   Specimen  Description  Final    URINE, CLEAN CATCH Performed at [facility], [address] [place], [place] [address]    Special Requests   Final    NONE Performed at [facility], [address], [place], [place] [address]    Culture   Final    NO GROWTH Performed at [facility], [place] [address], [place], [place] [address]    Report Status 02/10/2022 FINAL  Final  MRSA Next Gen by PCR, Nasal     Status: None   Collection Time: 02/09/22  2:39 PM   Specimen: Nasal Mucosa; Nasal Swab  Result Value Ref Range Status   MRSA by PCR Next Gen NOT DETECTED NOT DETECTED Final    Comment: (NOTE) The GeneXpert MRSA Assay (FDA approved for NASAL specimens only), is one component of a comprehensive MRSA colonization surveillance program. It is not intended to diagnose MRSA infection nor to guide or monitor treatment for MRSA infections. Test performance is not FDA approved in patients less than 36 years old. Performed at [facility], [place] [address], [place], [place] [address]   Culture, blood (Routine X 2) w Reflex to ID Panel     Status: Abnormal   Collection Time: 02/20/22  7:50 AM   Specimen: BLOOD  Result Value Ref Range Status   Specimen Description   Final    BLOOD LEFT ANTECUBITAL Performed at [facility] [address] [place], [place] [address]    Special Requests   Final    BOTTLES DRAWN AEROBIC AND ANAEROBIC Blood Culture adequate volume Performed at [facility] [address] [place], [place] [address]    Culture  Setup Time   Final    GRAM POSITIVE COCCI IN BOTH AEROBIC AND ANAEROBIC BOTTLES CRITICAL RESULT CALLED TO, READ BACK BY AND VERIFIED WITH: E JACKSON,PHARMD'@0200'$  02/21/22 [place] Performed at [facility], [address], [place], [place] [address]    Culture ENTEROCOCCUS FAECIUM (A)  Final   Report Status 02/22/2022 FINAL  Final   Organism ID, Bacteria  ENTEROCOCCUS FAECIUM  Final      Susceptibility   Enterococcus faecium - MIC*    AMPICILLIN <=2 SENSITIVE Sensitive     VANCOMYCIN <=0.5 SENSITIVE Sensitive     GENTAMICIN SYNERGY SENSITIVE Sensitive     * ENTEROCOCCUS FAECIUM  Blood Culture ID Panel (Reflexed)     Status: Abnormal   Collection Time: 02/20/22  7:50 AM  Result Value Ref Range Status   Enterococcus faecalis NOT DETECTED NOT DETECTED Final   Enterococcus Faecium DETECTED (A) NOT DETECTED Final    Comment: CRITICAL RESULT CALLED TO, READ BACK BY AND VERIFIED WITH: E JACKSON,PHARMD'@0200'$  02/21/22 [place]    Listeria monocytogenes NOT DETECTED NOT DETECTED Final   Staphylococcus species NOT DETECTED NOT DETECTED Final   Staphylococcus aureus (BCID) NOT DETECTED NOT DETECTED Final   Staphylococcus epidermidis NOT DETECTED NOT DETECTED Final   Staphylococcus lugdunensis NOT DETECTED NOT DETECTED Final   Streptococcus species NOT DETECTED NOT DETECTED Final   Streptococcus agalactiae NOT DETECTED NOT DETECTED Final   Streptococcus pneumoniae NOT DETECTED NOT DETECTED Final   Streptococcus pyogenes NOT DETECTED NOT DETECTED Final   A.calcoaceticus-baumannii NOT DETECTED NOT DETECTED Final   Bacteroides fragilis NOT DETECTED NOT DETECTED Final   Enterobacterales NOT DETECTED NOT DETECTED Final   Enterobacter cloacae complex NOT DETECTED NOT DETECTED Final   Escherichia coli NOT DETECTED NOT DETECTED Final   Klebsiella aerogenes NOT DETECTED NOT  DETECTED Final   Klebsiella oxytoca NOT DETECTED NOT DETECTED Final   Klebsiella pneumoniae NOT DETECTED NOT DETECTED Final   Proteus species NOT DETECTED NOT DETECTED Final   Salmonella species NOT DETECTED NOT DETECTED Final   Serratia marcescens NOT DETECTED NOT DETECTED Final   Haemophilus influenzae NOT DETECTED NOT DETECTED Final   Neisseria meningitidis NOT DETECTED NOT DETECTED Final   Pseudomonas aeruginosa NOT DETECTED NOT DETECTED Final   Stenotrophomonas maltophilia NOT  DETECTED NOT DETECTED Final   Candida albicans NOT DETECTED NOT DETECTED Final   Candida auris NOT DETECTED NOT DETECTED Final   Candida glabrata NOT DETECTED NOT DETECTED Final   Candida krusei NOT DETECTED NOT DETECTED Final   Candida parapsilosis NOT DETECTED NOT DETECTED Final   Candida tropicalis NOT DETECTED NOT DETECTED Final   Cryptococcus neoformans/gattii NOT DETECTED NOT DETECTED Final   Vancomycin resistance NOT DETECTED NOT DETECTED Final    Comment: Performed at [facility], [place] [address], [place], [place] [address]  Culture, blood (Routine X 2) w Reflex to ID Panel     Status: Abnormal   Collection Time: 02/20/22  7:54 AM   Specimen: BLOOD  Result Value Ref Range Status   Specimen Description   Final    BLOOD BLOOD RIGHT FOREARM Performed at [facility] [address], [place], [place] [address]    Special Requests   Final    BOTTLES DRAWN AEROBIC AND ANAEROBIC Blood Culture adequate volume Performed at [facility] [address] [place], [place] [address]    Culture  Setup Time   Final    GRAM POSITIVE COCCI IN BOTH AEROBIC AND ANAEROBIC BOTTLES CRITICAL VALUE NOTED.  VALUE IS CONSISTENT WITH PREVIOUSLY REPORTED AND CALLED VALUE.    Culture (A)  Final    ENTEROCOCCUS FAECIUM SUSCEPTIBILITIES PERFORMED ON PREVIOUS CULTURE WITHIN THE LAST 5 DAYS. Performed at [facility], [place] [address], [place], [place] [address]    Report Status 02/23/2022 FINAL  Final  Culture, blood (Routine X 2) w Reflex to ID Panel     Status: None (Preliminary result)   Collection Time: 02/21/22  6:32 AM   Specimen: BLOOD  Result Value Ref Range Status   Specimen Description   Final    BLOOD BLOOD RIGHT FOREARM Performed at [facility] [address] [place], [place] [address]    Special Requests   Final    BOTTLES DRAWN AEROBIC ONLY Blood Culture adequate volume Performed at [facility] [address] [place], [place] [address]    Culture   Final    NO GROWTH 2 DAYS Performed at [facility] [address] [place], [place] [address]    Report Status PENDING  Incomplete  Culture, blood (Routine X 2) w Reflex to ID Panel     Status: None (Preliminary result)   Collection Time: 02/21/22  6:32 AM   Specimen: BLOOD  Result Value Ref Range Status   Specimen Description   Final    BLOOD BLOOD RIGHT HAND Performed at [facility] [address] [place],  [address]    Special Requests   Final    IN PEDIATRIC BOTTLE Blood Culture results may not be optimal due to an inadequate volume of blood received in culture bottles Performed at [facility] [address], [place],  [address]    Culture   Final    NO GROWTH  2 DAYS Performed at [facility], [place] [address], [place], [place] [address]    Report Status PENDING  Incomplete    Studies/Results: DG CHEST PORT 1 VIEW  Result Date: 02/23/2022 CLINICAL DATA:  Weakness. Colon cancer and chronic lymphocytic leukemia. EXAM: PORTABLE CHEST 1 VIEW COMPARISON:  02/19/2022 FINDINGS: The heart size and mediastinal contours are within normal limits. Aortic atherosclerotic calcification incidentally noted. A Dobbhoff feeding tube is seen in place with the tip in the proximal stomach. Low lung volumes are seen with bibasilar atelectasis, new since prior exam. IMPRESSION: Low lung volumes with bibasilar atelectasis. Dobbhoff feeding tube tip in proximal stomach. Electronically Signed   By: Marlaine Hind M.D.   On: 02/23/2022 10:30      Assessment/Plan:  INTERVAL HISTORY: Blood cultures clearing  Principal Problem:   Bacteremia due to Enterococcus Active Problems:   Chronic lymphocytic leukemia (HCC)   Colon cancer (HCC)   Hypertension   Hypercholesterolemia   Diabetes mellitus (HCC)   Sepsis (HCC)   Neutropenia in patient with hx of CLL   Protein-calorie  malnutrition, severe (HCC)   Dehydration   Hyponatremia   Stage 3b chronic kidney disease (CKD) (HCC)   Neutropenic fever (HCC)   Refeeding syndrome   Hypophosphatemia   CLL (chronic lymphocytic leukemia) (HCC)   Failure to thrive in adult    [name] is a 81 y.o. male with  CLL with recurrence in context of coming off and on Imbruvaca with neutropenia AMP S E faecium bacteremia, 2D echocardiogram showing area that could be endocarditis as well as a pericardial effusion  #1  Ampicillin sensitive Enterococcus faecium bacteremia, rule out endocarditis with clinical findings on 2D echocardiogram  Continue ampicillin  He needs transesophageal echocardiogram  #2 pericardial effusion: Again TEE could help define this better  #3 neutropenia due to Imbruvica slowly improving after colony-stimulating factor  #4 Thrombocytopenia: is improving, and I do not think it should preclude TEE  #5 recurrent CLL: agree not going to be wanting him to get chemotherapy in context of bacteremia and possible endocarditis   I spent 36 minutes with the patient including than 50% of the time in face to face counseling of the patient his wife regarding the nature of Enterococcus VCM bacteremia the difference between treatment of bacteremia that is only history of in isolation versus endocarditis how TEE is performed and risks and benefits of TEE,  along with review of medical records in preparation for the visit and during the visit and in coordination of her care.  Dr Gale Journey is back tomorrow.   LOS: 3 days   Alcide Evener 02/23/2022, 3:39 PM

## 2022-02-23 NOTE — Progress Notes (Signed)
Inpatient Diabetes Program Recommendations  AACE/ADA: New Consensus Statement on Inpatient Glycemic Control (2015)  Target Ranges:  Prepandial:   less than 140 mg/dL      Peak postprandial:   less than 180 mg/dL (1-2 hours)      Critically ill patients:  140 - 180 mg/dL    Latest Reference Range & Units 02/22/22 00:19 02/22/22 04:18 02/22/22 07:23 02/22/22 12:24 02/22/22 16:43 02/22/22 21:38  Glucose-Capillary 70 - 99 mg/dL 240 (H) 270 (H) 308 (H)  7 units Novolog '@0945'$  168 (H)  2 units Novolog  153 (H)  2 units Novolog '@1817'$  266 (H)  (H): Data is abnormally high  Latest Reference Range & Units 02/23/22 00:19 02/23/22 05:31 02/23/22 07:44  Glucose-Capillary 70 - 99 mg/dL 290 (H) 298 (H) 378 (H)  9 units Novolog   (H): Data is abnormally high     Home DM Meds: Metformin 1000 mg bID  Current Orders: Novolog Sensitive Correction Scale/ SSI (0-9 units) TID AC + HS    MD- Note Hyperglycemia.  Getting tube feeds 55cc/hr.  Please consider:  1. Increase frequency of the Novolog SSI to Q4 hours  2. Start Novolog Tube Feed Coverage: Novolog 4 units Q4 hours HOLD if tube feed HELD for any reason     --Will follow patient during hospitalization--  Wyn Quaker RN, MSN, CDE Diabetes Coordinator Inpatient Glycemic Control Team Team Pager: [phone] (8a-5p)

## 2022-02-23 NOTE — Progress Notes (Signed)
PROGRESS NOTE  [name] CHY:[id number] DOB: [birth year] DOA: 02/19/2022 PCP: [name], MD   LOS: 3 days   Brief Narrative / Interim history: 81 year old male with history of CLL initially diagnosed in 2014, status postchemotherapy and currently on Imbruvica, DM 2, HTN, colorectal cancer 2009, who came into the hospital with progressive and generalized weakness.  Patient has been experiencing some weight loss and poor appetite for the past several months, was seen by GI in February as an outpatient and underwent work-up without any significant major findings.  He was taken off Imbruvica in April as it was believed that it may be related to that, however he continued to decline.  He was hospitalized just 2 weeks ago for dehydration, and when he left the hospital he was placed back on his Imbruvica.  While at home he continued to decline and was rehospitalized on 5/25.  Repeat CT scan of the abdomen and pelvis showed recurrence of the CLL, he was also found to be neutropenic, have a fever and have Enterococcus bacteremia.  Oncology and ID consulted  Subjective / 24h Interval events: Complaints of persistent cough.  No abdominal pain, no nausea or vomiting.   Assesement and Plan: Principal Problem:   Bacteremia due to Enterococcus Active Problems:   Neutropenia in patient with hx of CLL   Chronic lymphocytic leukemia (HCC)   Dehydration   Colon cancer (HCC)   Protein-calorie malnutrition, severe (HCC)   Hypertension   Hypercholesterolemia   Diabetes mellitus ([place])   Sepsis ([place])   Hyponatremia   Stage 3b chronic kidney disease (CKD) (HCC)   Neutropenic fever (HCC)   Refeeding syndrome   Hypophosphatemia   CLL (chronic lymphocytic leukemia) (HCC)   Failure to thrive in adult  Principal problem Neutropenic fever, sepsis due to Enterococcus bacteremia-patient met sepsis criteria on admission with tachycardia, tachypnea, neutropenia.  He was not initially febrile but did develop a  fever within his first 24 hours of being in the hospital.  Blood cultures were obtained and he was started on broad-spectrum antibiotics with vancomycin and cefepime.  Cultures eventually speciated Enterococcus, and he was switched to ampicillin.  ID consulted.  2D echo showed normal EF 60 to 65%, small pericardial effusion, there was also a nodular calcification in the right coronary cusp likely sclerosis but if high suspicion for SBE consider TEE.  Cardiology message today regarding scheduling a TEE.  Due to neutropenia he was also placed on Granix after discussing with Dr. Alen Blew  Active problems Goals of care-overall difficult situation and complex case given progression of his CLL with progressive lymphadenopathy and now complicated by persistent neutropenia and Enterococcus bacteremia with concern for infectious endocarditis.  Discussed with the wife at bedside, she seems to have a good understanding of everything.  In addition, he is too weak to eat on his own and currently has NG tube for feeding which is not a permanent solution.  She wants to see how the next few days look like and will think about his feeding as well.  It would be good to discuss with Dr. Alen Blew when he comes back from the long weekend regarding what are the prospects from chemotherapy especially in the setting of newly discovered bacteremia, and based on oncologic options may need palliative care consultation  Refeeding syndrome with hypophosphatemia-replete phosphorus, closely monitor  Profound dehydration, failure to thrive, dysphagia, chronic cough-patient with progressive weakness, poor p.o. intake and worsening dehydration.  Unfortunately due to weakness he failed swallow eval on 5/26,  and failed an MBS.  Dobbhoff was placed 5/26 and he was started on tube feeds.  Seems to be tolerating well.  Continue.  High risk of aspiration.  Continues to have a cough, slightly worsened today, repeat chest x-ray  Acute kidney injury on  chronic kidney disease stage IIIb-baseline creatinine around 1.4-1.6, increasing over the last 2 days and currently at 2.06.  Suspect he is behind on his fluids.  Add IVF today for the next 24 hours.  Recheck renal function tomorrow morning.  Hypokalemia-normalized this morning  Hypomagnesemia-normalized this morning   CLL -initially believed to be in remission but CT scan done on 5/25 shows recurrent disease.  Oncology consulted and following, will touch base with Dr. Alen Blew on the next workday tomorrow   Hyponatremia-sodium stable at 134 today.  Monitor   Sinus tachycardia-likely due to dehydration, but also active malignancy   Essential hypertension-hypertensive on admission, resumed home metoprolol.  Hold lisinopril in the setting of his CKD.  More hypertensive, Norvasc resumed 9/36.  Blood pressure remains acceptable today  Hyperlipidemia-continue statin  Anemia-likely due to underlying malignancy, no bleeding, continue to monitor hemoglobin.  8.4 this morning  Pancytopenia-due to recurrent malignancy.  Receiving Granix.  Antibiotics for neutropenic fever.  Continue to monitor   Type 2 diabetes mellitus-initially on on oral diet, however now on tube feeds, due to persistent hypoglycemia change sliding scale coverage to every 6 hours and add long-acting insulin  CBG (last 3)  Recent Labs    02/23/22 0019 02/23/22 0531 02/23/22 0744  GLUCAP 290* 298* 378*     Severe protein calorie malnutrition-consult RD.  Marinol as above.      Scheduled Meds:  amLODipine  2.5 mg Per NG tube Daily   aspirin  81 mg Per Tube Daily   atorvastatin  40 mg Per Tube Daily   chlorhexidine  15 mL Mouth Rinse BID   Chlorhexidine Gluconate Cloth  6 each Topical Daily   feeding supplement  237 mL Oral TID BM   feeding supplement (PROSource TF)  45 mL Per Tube BID   free water  100 mL Per Tube Q4H   guaiFENesin-dextromethorphan  5 mL Per Tube Q8H   heparin  5,000 Units Subcutaneous Q8H   insulin  aspart  0-5 Units Subcutaneous QHS   insulin aspart  0-9 Units Subcutaneous TID WC   mouth rinse  15 mL Mouth Rinse BID   metoprolol tartrate  25 mg Per Tube BID   mirtazapine  7.5 mg Per Tube QHS   multivitamin with minerals  1 tablet Per Tube Daily   ondansetron  4 mg Per Tube Q8H   Or   ondansetron (ZOFRAN) IV  4 mg Intravenous Q8H   pantoprazole sodium  40 mg Per Tube BID   Tbo-filgastrim (GRANIX) SQ  300 mcg Subcutaneous q1800   thiamine  100 mg Per Tube Daily   Continuous Infusions:  sodium chloride Stopped (02/20/22 0850)   sodium chloride 75 mL/hr at 02/23/22 0821   ampicillin (OMNIPEN) IV 2 g (02/23/22 0320)   feeding supplement (OSMOLITE 1.5 CAL) 1,000 mL (02/22/22 0715)   PRN Meds:.sodium chloride, acetaminophen, labetalol, prochlorperazine     DVT prophylaxis: heparin injection 5,000 Units Start: 02/19/22 1430   Lab Results  Component Value Date   PLT 127 (L) 02/23/2022      Code Status: DNR  Family Communication: Wife at bedside  Status is: Inpatient   Level of care: Progressive  Consultants:  Oncology Infectious disease  Procedures:  Blood cultures 5/26-Enterococcus  Antimicrobials Vancomycin 5/26 >> 5/27 Cefepime 5/26 >> 5/27 Ampicillin 5/27 >>   Objective: Vitals:   02/22/22 0424 02/22/22 1307 02/22/22 1957 02/23/22 0626  BP:  (!) 127/51 (!) 143/55 (!) 122/46  Pulse:  97 (!) 114 (!) 111  Resp:  14    Temp:  98.5 F (36.9 C) 98.1 F (36.7 C) 100.1 F (37.8 C)  TempSrc:  Oral Oral Oral  SpO2:  94% 95% 98%  Weight: 55.8 kg     Height:        Intake/Output Summary (Last 24 hours) at 02/23/2022 0825 Last data filed at 02/23/2022 0818 Gross per 24 hour  Intake 3149.16 ml  Output 600 ml  Net 2549.16 ml    Wt Readings from Last 3 Encounters:  02/22/22 55.8 kg  02/09/22 52.8 kg  01/12/22 54.9 kg    Examination:  Constitutional: NAD Eyes: lids and conjunctivae normal, no scleral icterus ENMT: mmm Neck: normal,  supple Respiratory: clear to auscultation bilaterally, no wheezing, no crackles. Normal respiratory effort.  Cardiovascular: Regular rate and rhythm, no murmurs / rubs / gallops. No LE edema. Abdomen: soft, no distention, no tenderness. Bowel sounds positive.  Skin: no rashes Neurologic: no focal deficits, equal strength   Data Reviewed: I have independently reviewed following labs and imaging studies   CBC Recent Labs  Lab 02/19/22 0235 02/20/22 0310 02/21/22 2774 02/22/22 0637 02/23/22 0542  WBC 1.2* 0.9* 0.9* 1.0* 1.6*  HGB 9.4* 8.6* 8.8* 8.6* 8.4*  HCT 28.3* 26.1* 26.8* 27.1* 26.7*  PLT 195 148* 151 125* 127*  MCV 79.5* 81.8 83.0 84.4 85.9  MCH 26.4 27.0 27.2 26.8 27.0  MCHC 33.2 33.0 32.8 31.7 31.5  RDW 16.7* 17.0* 17.6* 17.9* 18.3*  LYMPHSABS 1.0  --  0.5* 0.7 0.8  MONOABS 0.1  --  0.1 0.1 0.1  EOSABS 0.0  --  0.0 0.0 0.0  BASOSABS 0.0  --  0.0 0.0 0.0     Recent Labs  Lab 02/19/22 0235 02/19/22 0927 02/20/22 0310 02/21/22 0632 02/21/22 1657 02/22/22 0637 02/22/22 1645 02/23/22 0542  NA 131*  --  132* 131*  --  129*  --  134*  K 4.1  --  3.7 3.4*  --  3.8  --  3.8  CL 96*  --  101 100  --  100  --  104  CO2 22  --  22 23  --  22  --  23  GLUCOSE 327*  --  208* 247*  --  323*  --  353*  BUN 41*  --  34* 36*  --  36*  --  48*  CREATININE 1.74*  --  1.63* 1.54*  --  1.71*  --  2.06*  CALCIUM 9.1  --  8.0* 7.7*  --  7.3*  --  7.2*  AST 10*  --  18 31  --  32  --  29  ALT 10  --  15 26  --  29  --  31  ALKPHOS 103  --  79 78  --  82  --  79  BILITOT 0.9  --  1.1 0.8  --  0.6  --  0.6  ALBUMIN 3.5  --  2.4* 2.2*  --  1.9*  --  2.0*  MG  --   --   --  1.6* 1.9 1.8 1.8 1.9  LATICACIDVEN 3.4* 1.8  --   --   --   --   --   --      ------------------------------------------------------------------------------------------------------------------  No results for input(s): CHOL, HDL, LDLCALC, TRIG, CHOLHDL, LDLDIRECT in the last 72 hours.  Lab Results   Component Value Date   HGBA1C 6.9 (H) 02/09/2022   ------------------------------------------------------------------------------------------------------------------ No results for input(s): TSH, T4TOTAL, T3FREE, THYROIDAB in the last 72 hours.  Invalid input(s): FREET3  Cardiac Enzymes No results for input(s): CKMB, TROPONINI, MYOGLOBIN in the last 168 hours.  Invalid input(s): CK ------------------------------------------------------------------------------------------------------------------ No results found for: BNP  CBG: Recent Labs  Lab 02/22/22 1643 02/22/22 2138 02/23/22 0019 02/23/22 0531 02/23/22 0744  GLUCAP 153* 266* 290* 298* 378*     Recent Results (from the past 240 hour(s))  Culture, blood (Routine X 2) w Reflex to ID Panel     Status: Abnormal   Collection Time: 02/20/22  7:50 AM   Specimen: BLOOD  Result Value Ref Range Status   Specimen Description   Final    BLOOD LEFT ANTECUBITAL Performed at [facility], [place] [address], [place], [place] [address]    Special Requests   Final    BOTTLES DRAWN AEROBIC AND ANAEROBIC Blood Culture adequate volume Performed at [facility] [address] [place], [place] [address]    Culture  Setup Time   Final    GRAM POSITIVE COCCI IN BOTH AEROBIC AND ANAEROBIC BOTTLES CRITICAL RESULT CALLED TO, READ BACK BY AND VERIFIED WITH: E JACKSON,PHARMD@0200  02/21/22 [place] Performed at [facility], [place] [address] [place], [place] [address]    Culture ENTEROCOCCUS FAECIUM (A)  Final   Report Status 02/22/2022 FINAL  Final   Organism ID, Bacteria ENTEROCOCCUS FAECIUM  Final      Susceptibility   Enterococcus faecium - MIC*    AMPICILLIN <=2 SENSITIVE Sensitive     VANCOMYCIN <=0.5 SENSITIVE Sensitive     GENTAMICIN SYNERGY SENSITIVE Sensitive     * ENTEROCOCCUS FAECIUM  Blood Culture ID Panel (Reflexed)     Status: Abnormal   Collection Time: 02/20/22  7:50 AM  Result Value  Ref Range Status   Enterococcus faecalis NOT DETECTED NOT DETECTED Final   Enterococcus Faecium DETECTED (A) NOT DETECTED Final    Comment: CRITICAL RESULT CALLED TO, READ BACK BY AND VERIFIED WITH: E JACKSON,PHARMD@0200  02/21/22 [place]    Listeria monocytogenes NOT DETECTED NOT DETECTED Final   Staphylococcus species NOT DETECTED NOT DETECTED Final   Staphylococcus aureus (BCID) NOT DETECTED NOT DETECTED Final   Staphylococcus epidermidis NOT DETECTED NOT DETECTED Final   Staphylococcus lugdunensis NOT DETECTED NOT DETECTED Final   Streptococcus species NOT DETECTED NOT DETECTED Final   Streptococcus agalactiae NOT DETECTED NOT DETECTED Final   Streptococcus pneumoniae NOT DETECTED NOT DETECTED Final   Streptococcus pyogenes NOT DETECTED NOT DETECTED Final   A.calcoaceticus-baumannii NOT DETECTED NOT DETECTED Final   Bacteroides fragilis NOT DETECTED NOT DETECTED Final   Enterobacterales NOT DETECTED NOT DETECTED Final   Enterobacter cloacae complex NOT DETECTED NOT DETECTED Final   Escherichia coli NOT DETECTED NOT DETECTED Final   Klebsiella aerogenes NOT DETECTED NOT DETECTED Final   Klebsiella oxytoca NOT DETECTED NOT DETECTED Final   Klebsiella pneumoniae NOT DETECTED NOT DETECTED Final   Proteus species NOT DETECTED NOT DETECTED Final   Salmonella species NOT DETECTED NOT DETECTED Final   Serratia marcescens NOT DETECTED NOT DETECTED Final   Haemophilus influenzae NOT DETECTED NOT DETECTED Final   Neisseria meningitidis NOT DETECTED NOT DETECTED Final   Pseudomonas aeruginosa NOT DETECTED NOT DETECTED Final   Stenotrophomonas maltophilia NOT DETECTED NOT DETECTED Final  Candida albicans NOT DETECTED NOT DETECTED Final   Candida auris NOT DETECTED NOT DETECTED Final   Candida glabrata NOT DETECTED NOT DETECTED Final   Candida krusei NOT DETECTED NOT DETECTED Final   Candida parapsilosis NOT DETECTED NOT DETECTED Final   Candida tropicalis NOT DETECTED NOT DETECTED Final    Cryptococcus neoformans/gattii NOT DETECTED NOT DETECTED Final   Vancomycin resistance NOT DETECTED NOT DETECTED Final    Comment: Performed at [facility] [address] [place], [place] [address]  Culture, blood (Routine X 2) w Reflex to ID Panel     Status: Abnormal   Collection Time: 02/20/22  7:54 AM   Specimen: BLOOD  Result Value Ref Range Status   Specimen Description   Final    BLOOD BLOOD RIGHT FOREARM Performed at [facility] [address], [place], [place] [address]    Special Requests   Final    BOTTLES DRAWN AEROBIC AND ANAEROBIC Blood Culture adequate volume Performed at [facility] [address] [place], [place] [address]    Culture  Setup Time   Final    GRAM POSITIVE COCCI IN BOTH AEROBIC AND ANAEROBIC BOTTLES CRITICAL VALUE NOTED.  VALUE IS CONSISTENT WITH PREVIOUSLY REPORTED AND CALLED VALUE.    Culture (A)  Final    ENTEROCOCCUS FAECIUM SUSCEPTIBILITIES PERFORMED ON PREVIOUS CULTURE WITHIN THE LAST 5 DAYS. Performed at [facility], [place] [address] [place], [place] [address]    Report Status 02/23/2022 FINAL  Final  Culture, blood (Routine X 2) w Reflex to ID Panel     Status: None (Preliminary result)   Collection Time: 02/21/22  6:32 AM   Specimen: BLOOD  Result Value Ref Range Status   Specimen Description   Final    BLOOD BLOOD RIGHT FOREARM Performed at [facility] [address], [place], [place] [address]    Special Requests   Final    BOTTLES DRAWN AEROBIC ONLY Blood Culture adequate volume Performed at [facility] [address] [place], [place] [address]    Culture   Final    NO GROWTH 2 DAYS Performed at [facility] [address], [place], [place] [address]    Report Status PENDING  Incomplete  Culture, blood (Routine X 2) w Reflex to ID Panel     Status: None (Preliminary result)   Collection Time: 02/21/22  6:32 AM   Specimen:  BLOOD  Result Value Ref Range Status   Specimen Description   Final    BLOOD BLOOD RIGHT HAND Performed at [facility] [address] [place], [place] [address]    Special Requests   Final    IN PEDIATRIC BOTTLE Blood Culture results may not be optimal due to an inadequate volume of blood received in culture bottles Performed at [facility] [address], [place], [place] [address]    Culture   Final    NO GROWTH 2 DAYS Performed at [facility] [address] [place],  [address]    Report Status PENDING  Incomplete     Radiology Studies: No results found.   Marzetta Board, MD, PhD [facility]  Between 7 am - 7 pm I am available, please contact me via Amion (for emergencies) or Securechat (non urgent messages)  Between 7 pm - 7 am I am not available, please contact night coverage MD/APP via Amion

## 2022-02-24 DIAGNOSIS — E43 Unspecified severe protein-calorie malnutrition: Secondary | ICD-10-CM

## 2022-02-24 DIAGNOSIS — N179 Acute kidney failure, unspecified: Secondary | ICD-10-CM

## 2022-02-24 DIAGNOSIS — Z66 Do not resuscitate: Secondary | ICD-10-CM

## 2022-02-24 DIAGNOSIS — B952 Enterococcus as the cause of diseases classified elsewhere: Secondary | ICD-10-CM

## 2022-02-24 DIAGNOSIS — D701 Agranulocytosis secondary to cancer chemotherapy: Secondary | ICD-10-CM

## 2022-02-24 DIAGNOSIS — R627 Adult failure to thrive: Secondary | ICD-10-CM | POA: Diagnosis not present

## 2022-02-24 DIAGNOSIS — E860 Dehydration: Secondary | ICD-10-CM | POA: Diagnosis not present

## 2022-02-24 DIAGNOSIS — C189 Malignant neoplasm of colon, unspecified: Secondary | ICD-10-CM

## 2022-02-24 DIAGNOSIS — Z515 Encounter for palliative care: Secondary | ICD-10-CM

## 2022-02-24 DIAGNOSIS — T451X5A Adverse effect of antineoplastic and immunosuppressive drugs, initial encounter: Secondary | ICD-10-CM | POA: Diagnosis not present

## 2022-02-24 DIAGNOSIS — R7881 Bacteremia: Secondary | ICD-10-CM | POA: Diagnosis not present

## 2022-02-24 DIAGNOSIS — E1165 Type 2 diabetes mellitus with hyperglycemia: Secondary | ICD-10-CM

## 2022-02-24 DIAGNOSIS — C9110 Chronic lymphocytic leukemia of B-cell type not having achieved remission: Secondary | ICD-10-CM | POA: Diagnosis not present

## 2022-02-24 LAB — CBC WITH DIFFERENTIAL/PLATELET
Abs Immature Granulocytes: 0.15 10*3/uL — ABNORMAL HIGH (ref 0.00–0.07)
Basophils Absolute: 0 10*3/uL (ref 0.0–0.1)
Basophils Relative: 1 %
Eosinophils Absolute: 0 10*3/uL (ref 0.0–0.5)
Eosinophils Relative: 0 %
HCT: 29 % — ABNORMAL LOW (ref 39.0–52.0)
Hemoglobin: 8.8 g/dL — ABNORMAL LOW (ref 13.0–17.0)
Immature Granulocytes: 6 %
Lymphocytes Relative: 49 %
Lymphs Abs: 1.2 10*3/uL (ref 0.7–4.0)
MCH: 26.9 pg (ref 26.0–34.0)
MCHC: 30.3 g/dL (ref 30.0–36.0)
MCV: 88.7 fL (ref 80.0–100.0)
Monocytes Absolute: 0.2 10*3/uL (ref 0.1–1.0)
Monocytes Relative: 9 %
Neutro Abs: 0.8 10*3/uL — ABNORMAL LOW (ref 1.7–7.7)
Neutrophils Relative %: 35 %
Platelets: 120 10*3/uL — ABNORMAL LOW (ref 150–400)
RBC: 3.27 MIL/uL — ABNORMAL LOW (ref 4.22–5.81)
RDW: 18.2 % — ABNORMAL HIGH (ref 11.5–15.5)
WBC: 2.4 10*3/uL — ABNORMAL LOW (ref 4.0–10.5)
nRBC: 0 % (ref 0.0–0.2)

## 2022-02-24 LAB — PHOSPHORUS
Phosphorus: 2.4 mg/dL — ABNORMAL LOW (ref 2.5–4.6)
Phosphorus: 1.6 mg/dL — ABNORMAL LOW (ref 2.5–4.6)

## 2022-02-24 LAB — BASIC METABOLIC PANEL
Anion gap: 9 (ref 5–15)
BUN: 61 mg/dL — ABNORMAL HIGH (ref 8–23)
CO2: 21 mmol/L — ABNORMAL LOW (ref 22–32)
Calcium: 7.1 mg/dL — ABNORMAL LOW (ref 8.9–10.3)
Chloride: 108 mmol/L (ref 98–111)
Creatinine, Ser: 2.56 mg/dL — ABNORMAL HIGH (ref 0.61–1.24)
GFR, Estimated: 25 mL/min — ABNORMAL LOW (ref >60)
Glucose, Bld: 304 mg/dL — ABNORMAL HIGH (ref 70–99)
Potassium: 4 mmol/L (ref 3.5–5.1)
Sodium: 138 mmol/L (ref 135–145)

## 2022-02-24 LAB — GLUCOSE, CAPILLARY
Glucose-Capillary: 304 mg/dL — ABNORMAL HIGH (ref 70–99)
Glucose-Capillary: 303 mg/dL — ABNORMAL HIGH (ref 70–99)
Glucose-Capillary: 339 mg/dL — ABNORMAL HIGH (ref 70–99)
Glucose-Capillary: 278 mg/dL — ABNORMAL HIGH (ref 70–99)
Glucose-Capillary: 191 mg/dL — ABNORMAL HIGH (ref 70–99)

## 2022-02-24 LAB — MAGNESIUM
Magnesium: 2 mg/dL (ref 1.7–2.4)
Magnesium: 1.9 mg/dL (ref 1.7–2.4)

## 2022-02-24 MED ORDER — INSULIN ASPART 100 UNIT/ML IJ SOLN
0.0000 [IU] | INTRAMUSCULAR | Status: DC
  Administered 2022-02-24: 5 [IU] via SUBCUTANEOUS
  Administered 2022-02-24: 7 [IU] via SUBCUTANEOUS
  Administered 2022-02-24: 2 [IU] via SUBCUTANEOUS
  Administered 2022-02-25: 5 [IU] via SUBCUTANEOUS
  Administered 2022-02-25 (×2): 2 [IU] via SUBCUTANEOUS
  Administered 2022-02-25 (×3): 3 [IU] via SUBCUTANEOUS
  Administered 2022-02-26 (×2): 2 [IU] via SUBCUTANEOUS
  Administered 2022-02-26: 5 [IU] via SUBCUTANEOUS
  Administered 2022-02-26: 3 [IU] via SUBCUTANEOUS
  Administered 2022-02-26: 2 [IU] via SUBCUTANEOUS
  Administered 2022-02-26: 3 [IU] via SUBCUTANEOUS
  Administered 2022-02-26: 2 [IU] via SUBCUTANEOUS
  Administered 2022-02-27: 3 [IU] via SUBCUTANEOUS
  Administered 2022-02-27: 1 [IU] via SUBCUTANEOUS
  Administered 2022-02-27: 3 [IU] via SUBCUTANEOUS
  Administered 2022-02-27: 2 [IU] via SUBCUTANEOUS
  Administered 2022-02-27 (×2): 1 [IU] via SUBCUTANEOUS
  Administered 2022-02-28: 3 [IU] via SUBCUTANEOUS
  Administered 2022-02-28 (×2): 2 [IU] via SUBCUTANEOUS
  Administered 2022-03-02 (×2): 5 [IU] via SUBCUTANEOUS
  Administered 2022-03-03: 3 [IU] via SUBCUTANEOUS
  Administered 2022-03-03: 1 [IU] via SUBCUTANEOUS
  Administered 2022-03-03: 2 [IU] via SUBCUTANEOUS
  Administered 2022-03-03 (×2): 3 [IU] via SUBCUTANEOUS
  Administered 2022-03-03: 2 [IU] via SUBCUTANEOUS
  Administered 2022-03-03 – 2022-03-04 (×2): 3 [IU] via SUBCUTANEOUS
  Administered 2022-03-04: 1 [IU] via SUBCUTANEOUS
  Administered 2022-03-04: 2 [IU] via SUBCUTANEOUS
  Administered 2022-03-04: 3 [IU] via SUBCUTANEOUS
  Administered 2022-03-05: 2 [IU] via SUBCUTANEOUS
  Administered 2022-03-05: 3 [IU] via SUBCUTANEOUS
  Administered 2022-03-05: 1 [IU] via SUBCUTANEOUS
  Administered 2022-03-05 (×2): 2 [IU] via SUBCUTANEOUS
  Administered 2022-03-06 (×2): 3 [IU] via SUBCUTANEOUS
  Administered 2022-03-06 (×2): 1 [IU] via SUBCUTANEOUS
  Administered 2022-03-06: 2 [IU] via SUBCUTANEOUS
  Administered 2022-03-07 (×2): 3 [IU] via SUBCUTANEOUS
  Administered 2022-03-07 – 2022-03-08 (×4): 1 [IU] via SUBCUTANEOUS
  Administered 2022-03-08: 2 [IU] via SUBCUTANEOUS
  Administered 2022-03-08: 1 [IU] via SUBCUTANEOUS
  Administered 2022-03-08: 3 [IU] via SUBCUTANEOUS

## 2022-02-24 MED ORDER — INSULIN ASPART 100 UNIT/ML IJ SOLN
4.0000 [IU] | INTRAMUSCULAR | Status: DC
Start: 2022-02-24 — End: 2022-02-25
  Administered 2022-02-24 – 2022-02-25 (×6): 4 [IU] via SUBCUTANEOUS

## 2022-02-24 MED ORDER — ZINC OXIDE 40 % EX OINT
TOPICAL_OINTMENT | CUTANEOUS | Status: DC | PRN
  Filled 2022-02-24 (×4): qty 57

## 2022-02-24 MED ORDER — SODIUM CHLORIDE 0.9 % IV SOLN: INTRAVENOUS | Status: AC

## 2022-02-24 NOTE — Evaluation (Signed)
Occupational Therapy Evaluation Patient Details Name: [name] MRN: [id number] DOB: August 05, 1941 Today's Date: 02/24/2022   History of Present Illness 81 year old male with history of CLL initially diagnosed in 2014, status postchemotherapy and currently on Imbruvica, DM 2, HTN, colorectal cancer 2009, who came into the hospital with progressive and generalized weakness and admitted 02/19/22 with Bacteremia due to Enterococcus   Clinical Impression   Patient is currently requiring assistance with ADLs including min guard to minimal assist with Lower body ADLs, setup to min guard assist with Upper body ADLs,  as well as  minimal assist with functional transfers to stand and lacking activity tolerance to take steps.  Current level of function is below patient's typical baseline.  During this evaluation, patient was limited by generalized weakness, impaired activity tolerance, and baseline bowel incontinence, all of which has the potential to impact patient's safety and independence during functional mobility, as well as performance for ADLs.  Patient lives at home, with his wife who is able to provide 24/7 supervision and assistance.  Patient demonstrates good rehab potential, and should benefit from continued skilled occupational therapy services while in acute care to maximize safety, independence and quality of life at home.  Continued occupational therapy services in the home is recommended.  ?    Recommendations for follow up therapy are one component of a multi-disciplinary discharge planning process, led by the attending physician.  Recommendations may be updated based on patient status, additional functional criteria and insurance authorization.   Follow Up Recommendations  Home health OT    Assistance Recommended at Discharge Frequent or constant Supervision/Assistance  Patient can return home with the following A little help with walking and/or transfers;A little help with  bathing/dressing/bathroom;Assist for transportation;Assistance with cooking/housework;Help with stairs or ramp for entrance;Direct supervision/assist for medications management    Functional Status Assessment  Patient has had a recent decline in their functional status and demonstrates the ability to make significant improvements in function in a reasonable and predictable amount of time.  Equipment Recommendations  BSC/3in1    Recommendations for Other Services       Precautions / Restrictions Precautions Precautions: Fall Precaution Comments: feeding tube Restrictions Weight Bearing Restrictions: No      Mobility Bed Mobility Overal bed mobility: Modified Independent             General bed mobility comments: increased time and effort, utilized elevated HOB and rails    Transfers Overall transfer level: Needs assistance                        Balance Overall balance assessment: History of Falls, Needs assistance Sitting-balance support: No upper extremity supported, Feet supported Sitting balance-Leahy Scale: Fair     Standing balance support: Single extremity supported, During functional activity Standing balance-Leahy Scale: Poor Standing balance comment: reliant on UE support                           ADL either performed or assessed with clinical judgement   ADL Overall ADL's : Needs assistance/impaired   Eating/Feeding Details (indicate cue type and reason): Only allower ice chips. Not observed this session. Grooming: Sitting;Wash/dry hands;Set up   Upper Body Bathing: Min guard;Sitting;Set up   Lower Body Bathing: Minimal assistance;Sitting/lateral leans;Sit to/from stand   Upper Body Dressing : Min guard;Sitting   Lower Body Dressing: Min guard;Sitting/lateral leans;Sit to/from stand Lower Body Dressing Details (indicate cue type and reason):  Pt required significantly increased time/effort to doff and don each sock while EOB  with close supervision as pt leans down to floor. Min guard for standing LE dressing. Toilet Transfer: Minimal Print production planner Details (indicate cue type and reason): Pt stood from EOB to RW with Min assist. Toileting- Clothing Manipulation and Hygiene: Sit to/from stand;Minimal assistance Toileting - Clothing Manipulation Details (indicate cue type and reason): Pt found to be soiled once OOB and stood to perform hygiene with [place] for steadying to to keep gowns out of pt's way.  OT applied Desiden Cream to peri areas Total As. Peri Skin with red,raw patches.     Functional mobility during ADLs: Minimal assistance;Min guard;Rolling walker (2 wheels)       Vision Baseline Vision/History: 0 No visual deficits Vision Assessment?: No apparent visual deficits     Perception     Praxis      Pertinent Vitals/Pain Pain Assessment Pain Assessment: No/denies pain Faces Pain Scale: No hurt     Hand Dominance Right   Extremity/Trunk Assessment Upper Extremity Assessment Upper Extremity Assessment: Generalized weakness   Lower Extremity Assessment Lower Extremity Assessment: Generalized weakness   Cervical / Trunk Assessment Cervical / Trunk Assessment: Normal   Communication Communication Communication: No difficulties   Cognition Arousal/Alertness: Awake/alert Behavior During Therapy: Flat affect Overall Cognitive Status: Within Functional Limits for tasks assessed                                       General Comments       Exercises     Shoulder Instructions      Home Living Family/patient expects to be discharged to:: Private residence Living Arrangements: Spouse/significant other Available Help at Discharge: Family;Available 24 hours/day Type of Home: House Home Access: Stairs to enter [employer] of Steps: 1   Home Layout: Two level;Able to live on main level with bedroom/bathroom Alternate Level Stairs-Number of  Steps: Loft upstairs that pt does not need to access   Bathroom Shower/Tub: Occupational psychologist: Standard (sink to push up)     Home Equipment: Conservation officer, nature (2 wheels);Cane - quad;Shower seat;Hand held shower head   Additional Comments: Sleeps in Lift recliner (Pt thinks it has a lift mechanism). Pt reports plan to install grab bars in shower soon      Prior Functioning/Environment Prior Level of Function : Independent/Modified Independent;Needs assist;Driving;History of Falls (last six months)       Physical Assist : ADLs (physical)   ADLs (physical): IADLs;Toileting Mobility Comments: reports previously being independent prior to hospitalization ADLs Comments: Has housekeeper 1x/month. Pt does his own laundry. Spouse cooks.  Pt is incontinent of bowel but can control bladder        OT Problem List: Decreased activity tolerance;Impaired balance (sitting and/or standing);Decreased strength      OT Treatment/Interventions: Self-care/ADL training;Therapeutic exercise;Therapeutic activities;Energy conservation;Patient/family education;DME and/or AE instruction;Balance training    OT Goals(Current goals can be found in the care plan section) Acute Rehab OT Goals Patient Stated Goal: To increase strength OT Goal Formulation: With patient Time For Goal Achievement: 03/10/22 Potential to Achieve Goals: Good ADL Goals Pt Will Perform Grooming: standing;with modified independence Pt Will Perform Upper Body Bathing: with supervision;sitting Pt Will Perform Lower Body Bathing: with adaptive equipment;sitting/lateral leans;with supervision Pt Will Perform Lower Body Dressing: with adaptive equipment;with caregiver independent in assisting;sitting/lateral leans;sit to/from stand;with set-up;with supervision  Pt Will Transfer to Toilet: with supervision;ambulating;bedside commode Pt Will Perform Toileting - Clothing Manipulation and hygiene: with supervision;with adaptive  equipment;sitting/lateral leans;sit to/from stand Pt/caregiver will Perform Home Exercise Program: Increased strength;Both right and left upper extremity;With Supervision Additional ADL Goal #1: Patient will identify at least 3 energy conservation strategies to employ at home in order to maximize function and quality of life and decrease caregiver burden while preventing exacerbation of symptoms and rehospitalization.  OT Frequency: Min 2X/week    Co-evaluation              AM-PAC OT "6 Clicks" Daily Activity     Outcome Measure Help from another person eating meals?: A Little Help from another person taking care of personal grooming?: A Little Help from another person toileting, which includes using toliet, bedpan, or urinal?: A Little Help from another person bathing (including washing, rinsing, drying)?: A Little Help from another person to put on and taking off regular upper body clothing?: A Little Help from another person to put on and taking off regular lower body clothing?: A Little 6 Click Score: 18   End of Session Equipment Utilized During Treatment: Gait belt;Rolling walker (2 wheels) Nurse Communication: Mobility status  Activity Tolerance: Patient tolerated treatment well Patient left: in bed;with call bell/phone within reach;with bed alarm set  OT Visit Diagnosis: Unsteadiness on feet (R26.81);History of falling (Z91.81);Muscle weakness (generalized) (M62.81)                Time: 8144-8185 OT Time Calculation (min): 27 min Charges:  OT General Charges $OT Visit: 1 Visit OT Evaluation $OT Eval Low Complexity: 1 Low OT Treatments $Self Care/Home Management : 8-22 mins  Anderson Malta, OT Acute Rehab Services Office: [phone] 02/24/2022 Julien Girt 02/24/2022, 1:35 PM

## 2022-02-24 NOTE — Progress Notes (Signed)
Speech Language Pathology Treatment: Dysphagia  Patient Details Name: [name] MRN: [id number] DOB: December 13, 1940 Today's Date: 02/24/2022 Time: 4098-1191 SLP Time Calculation (min) (ACUTE ONLY): 12 min  Assessment / Plan / Recommendation Clinical Impression  Pt seen to initiate RMST to strengthen pt's phonation and laryngeal elevation for airway protection with po intake.  [employer] at level 7 cmH20 based on clinical judgement with excellent participation and tolerance.   Pt able to perform task adequately with a single direction - and min cues for allowance of rest breaks of 2 seconds at least between respirations.  He reports his work level to be approx 7/10 overall - which is withing goal range.  Oral care conducted by pt after brush/past given to him - after which he consumed single ice chips. No overt indication of aspiration - no overt coughing.  Reviewed clinical reasoning for RMST and ice chips consumption using teach back and instructions.  Recommend to repeat MBS prior to dc from hospital *WITHOUT nasal feeding tube in place* given his UES dysfunction.  Wife not present during session and instructions left for her and pt.  Pt reports sensation of improved swallow but clinically not observed.    HPI HPI: 81yo male admitted 02/19/22 with generalized weakness, recurrent falls. PMH: CLL, DM2, HTN, colorectal cancer (2009), HLD. Recent hospitalization due to poor appetite/intake, FTT.  Pt had COVID and has had cough since his COVID.  MBS completed with recommendation for NPO.  Pt has receiving swallow treatment.      SLP Plan  Continue with current plan of care      Recommendations for follow up therapy are one component of a multi-disciplinary discharge planning process, led by the attending physician.  Recommendations may be updated based on patient status, additional functional criteria and insurance authorization.    Recommendations  Diet recommendations:  Other(comment) (ice chips) Medication Administration: Via alternative means Compensations: Slow rate (ice chips) Postural Changes and/or Swallow Maneuvers: Out of bed for meals;Seated upright 90 degrees                Follow Up Recommendations: Home health SLP Assistance recommended at discharge: Frequent or constant Supervision/Assistance SLP Visit Diagnosis: Dysphagia, oropharyngeal phase (R13.12) Plan: Continue with current plan of care           [name]  02/24/2022, 1:16 PM.  Kathleen Lime, MS [facility] SLP [place] Office [phone] Pager [phone]

## 2022-02-24 NOTE — Consult Note (Signed)
Consultation Note Date: 02/24/2022   Patient Name: [name]  DOB: [birth year]  MRN: [id number]  Age / Sex: 81 y.o., male  PCP: [name], MD Referring Physician: Aline August, MD  Reason for Consultation: Establishing goals of care  HPI/Patient Profile: 81 y.o. male  with past medical history of CLL with recurrence, DM2, HTN, and colorectal cancer (2009) admitted on 02/19/2022 with progressive, generalized weakness. Pt endorses poor appetite and weight loss over the last several month. He was evaluated with full work up by GI in February 2023 with no significant findings. His Erlene Senters was stopped in April in hopes of relieving weakness/poor appetite. Two weeks later he was hospitalized for dehydration and placed back on Imbruvica. He decliend at home and returned to ED for this admission.   On admission, CT of abd/pelvis reveal recurrence of CLL. Pt is being treated for possible endocarditis and pericardial effusion  (TEE pending), bacteremia, neutropenia, and thrombocytopenia. Pt is being followed by ID and oncology.   PMT was consulted to discuss [place].   Clinical Assessment and Goals of Care: I have reviewed medical records including EPIC notes, labs and imaging, assessed the patient and then met with patient at bedside to discuss diagnosis prognosis, GOC, EOL wishes, disposition and options. He shares his wife is at home this morning since they are starting construction of a new bathroom on their home today.   I introduced Palliative Medicine as specialized medical care for people living with serious illness. It focuses on providing relief from the symptoms and stress of a serious illness. The goal is to improve quality of life for both the patient and the family.  We discussed a brief life review of the patient. He and his wife have been married for 56 years. They are from [place]. 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 He and his wife have one son, [name], who lives in [place].   As far as functional and nutritional status patient endorses he felt weak and had no appetite at home over the past several months. He reports independence with all ADLs and was driving PTA.  We discussed patient's current illness and what it means in the larger context of patient's on-going co-morbidities.  He says he likes his feeding tube because he knows he is getting nutrition now. He shares it was difficult for him to swallow and get food down PTA. So, now he is "just fine" with his feeding tube. Reviewed that cortrak is a temporary means of nutrition, which he understood. Discussed risks and benefits of PEG tube/permanent feeding tube placement.   I attempted to elicit values and goals of care important to the patient.  Patient had few words to share but said he would like to keep his feeding tube. He understands he has an infection in his chest and that his CLL is "back".   Artificial feeding and hydration and disposition were considered and  discussed.  Pt is awaiting further testing to see what his next steps and treatment options include.   Discussed with patient/family the importance of continued conversation with family and the medical providers regarding overall plan of care and treatment options, ensuring decisions are within the context of the patient's values and GOCs.    Questions and concerns were addressed. The patient was encouraged to call with questions or concerns. PMT will continue to follow the patient throughout his hospitalization.   Primary Decision Maker PATIENT  Code Status/Advance Care Planning: DNR  Prognosis:   Unable to determine  Discharge Planning: Home with Palliative Services likely - will discuss with pt once discharge plan is established  Primary Diagnoses: Present on  Admission:  Dehydration  Neutropenia in patient with hx of CLL  Chronic lymphocytic leukemia ([place])  Colon cancer ([place])  Protein-calorie malnutrition, severe ([place])  Hypertension  Hypercholesterolemia  Sepsis ([place])   Physical Exam Vitals reviewed.  Constitutional:      Comments: thin  HENT:     Head: Normocephalic.     Nose: Nose normal.     Comments: Cortrak to left nare Eyes:     Pupils: Pupils are equal, round, and reactive to light.  Cardiovascular:     Rate and Rhythm: Normal rate.  Pulmonary:     Effort: Pulmonary effort is normal.  Abdominal:     Palpations: Abdomen is soft.  Skin:    General: Skin is warm and dry.  Neurological:     Mental Status: He is alert and oriented to person, place, and time. Mental status is at baseline.  Psychiatric:        Mood and Affect: Mood normal.        Behavior: Behavior normal.        Thought Content: Thought content normal.        Judgment: Judgment normal.    Palliative Assessment/Data: 60%     I discussed this patient's plan of care with patient.  Thank you for this consult. Palliative medicine will continue to follow and assist holistically.   Time Total: 75 minutes Greater than 50%  of this time was spent counseling and coordinating care related to the above assessment and plan.  Signed by: Jordan Hawks, DNP, FNP-BC Palliative Medicine    Please contact Palliative Medicine Team phone at [phone] for questions and concerns.  For individual provider: See Shea Evans

## 2022-02-24 NOTE — Evaluation (Signed)
Physical Therapy Evaluation Patient Details Name: [name] MRN: [id number] DOB: [birth year] Today's Date: 02/24/2022  History of Present Illness  81 year old male with history of CLL initially diagnosed in 2014, status postchemotherapy and currently on Imbruvica, DM 2, HTN, colorectal cancer 2009, who came into the hospital with progressive and generalized weakness and admitted 02/19/22 with Bacteremia due to Enterococcus  Clinical Impression  Pt admitted with above diagnosis. Pt currently with functional limitations due to the deficits listed below (see PT Problem List). Pt will benefit from skilled PT to increase their independence and safety with mobility to allow discharge to the venue listed below.   Pt presents with generalized weakness and deconditioning.  Pt assisted to [facility] and then recliner with transfers only as pt felt unable to ambulate at this time.  Pt reports he lives with his wife and she can assist him upon d/c.      Recommendations for follow up therapy are one component of a multi-disciplinary discharge planning process, led by the attending physician.  Recommendations may be updated based on patient status, additional functional criteria and insurance authorization.  Follow Up Recommendations Home health PT    Assistance Recommended at Discharge Intermittent Supervision/Assistance  Patient can return home with the following  A little help with walking and/or transfers;A little help with bathing/dressing/bathroom    Equipment Recommendations None recommended by PT  Recommendations for Other Services       Functional Status Assessment Patient has had a recent decline in their functional status and demonstrates the ability to make significant improvements in function in a reasonable and predictable amount of time.     Precautions / Restrictions Precautions Precautions: Fall Precaution Comments: feeding tube left nare      Mobility  Bed Mobility Overal bed  mobility: Modified Independent             General bed mobility comments: increased time and effort, utilized elevated HOB and rails    Transfers Overall transfer level: Needs assistance Equipment used: None Transfers: Sit to/from Stand, Bed to chair/wheelchair/BSC Sit to Stand: Min assist Stand pivot transfers: Min assist         General transfer comment: cues for hand placement for UE support (bed rail, BSC, recliner armrests); pt requested assist to [facility] then felt unable to ambulate around bed to recliner so recliner brought over to [facility] for transfer    Ambulation/Gait                  Stairs            Wheelchair Mobility    Modified Rankin (Stroke Patients Only)       Balance Overall balance assessment: Needs assistance         Standing balance support: Single extremity supported, During functional activity Standing balance-Leahy Scale: Poor Standing balance comment: reliant on UE support                             Pertinent Vitals/Pain Pain Assessment Pain Assessment: Faces Faces Pain Scale: Hurts little more Pain Location: periarea - redness, RN brought and applied cream Pain Descriptors / Indicators: Grimacing, Tender Pain Intervention(s): Repositioned, Monitored during session    Home Living Family/patient expects to be discharged to:: Private residence Living Arrangements: Spouse/significant other   Type of Home: House Home Access: Stairs to enter   Technical brewer of Steps: 1   Home Layout: Two level;Able to live on main level with  bedroom/bathroom Home Equipment: Conservation officer, nature (2 wheels)      Prior Function Prior Level of Function : Independent/Modified Independent             Mobility Comments: reports previously being independent prior to hospitalization       Hand Dominance        Extremity/Trunk Assessment        Lower Extremity Assessment Lower Extremity Assessment: Generalized  weakness    Cervical / Trunk Assessment Cervical / Trunk Assessment: Normal  Communication   Communication: No difficulties  Cognition Arousal/Alertness: Awake/alert Behavior During Therapy: Flat affect Overall Cognitive Status: Within Functional Limits for tasks assessed                                          General Comments      Exercises     Assessment/Plan    PT Assessment Patient needs continued PT services  PT Problem List Decreased strength;Decreased activity tolerance;Decreased mobility;Decreased balance;Decreased knowledge of use of DME       PT Treatment Interventions DME instruction;Gait training;Therapeutic exercise;Balance training;Functional mobility training;Therapeutic activities;Patient/family education    PT Goals (Current goals can be found in the Care Plan section)  Acute Rehab PT Goals PT Goal Formulation: With patient Time For Goal Achievement: 03/10/22 Potential to Achieve Goals: Good    Frequency Min 3X/week     Co-evaluation               AM-PAC PT "6 Clicks" Mobility  Outcome Measure Help needed turning from your back to your side while in a flat bed without using bedrails?: A Little Help needed moving from lying on your back to sitting on the side of a flat bed without using bedrails?: A Little Help needed moving to and from a bed to a chair (including a wheelchair)?: A Little Help needed standing up from a chair using your arms (e.g., wheelchair or bedside chair)?: A Lot Help needed to walk in hospital room?: A Lot Help needed climbing 3-5 steps with a railing? : A Lot 6 Click Score: 15    End of Session   Activity Tolerance: Patient tolerated treatment well Patient left: in chair;with call bell/phone within reach;with chair alarm set;with nursing/sitter in room Nurse Communication: Mobility status PT Visit Diagnosis: Difficulty in walking, not elsewhere classified (R26.2);Muscle weakness (generalized)  (M62.81)    Time: 1093-2355 PT Time Calculation (min) (ACUTE ONLY): 19 min   Charges:   PT Evaluation $PT Eval Low Complexity: 1 Low        Kati PT, DPT Acute Rehabilitation Services Pager: [phone] Office: [place] 02/24/2022, 11:06 AM

## 2022-02-24 NOTE — Progress Notes (Signed)
Inpatient Diabetes Program Recommendations  AACE/ADA: New Consensus Statement on Inpatient Glycemic Control (2015)  Target Ranges:  Prepandial:   less than 140 mg/dL      Peak postprandial:   less than 180 mg/dL (1-2 hours)      Critically ill patients:  140 - 180 mg/dL   Lab Results  Component Value Date   GLUCAP 303 (H) 02/24/2022   HGBA1C 6.9 (H) 02/09/2022    Review of Glycemic Control  Latest Reference Range & Units 02/23/22 07:44 02/23/22 13:49 02/23/22 19:25 02/24/22 02:11 02/24/22 07:36  Glucose-Capillary 70 - 99 mg/dL 378 (H) 305 (H) 308 (H) 304 (H) 303 (H)  (H): Data is abnormally high  Current orders for Inpatient glycemic control: Semglee 12 units QD, Novolog 0-9 units Q6H    Please consider:   1. Increase frequency of the Novolog SSI to Q4 hours   2. Start Novolog Tube Feed Coverage: Novolog 4 units Q4 hours HOLD if tube feed HELD for any reason  Will continue to follow while inpatient.  Thank you, Reche Dixon, MSN, [place] [facility] Coordinator Inpatient Diabetes Program [phone] (team pager from 8a-5p)

## 2022-02-24 NOTE — Progress Notes (Signed)
Subjective: Fever resolved Feels about the same in terms of energy level since admission Repeat bcx negative No n/v/diarrhea No headache, visual change, joint/back pain No rash, focal weakness/numbness/tingling    Antibiotics:  Anti-infectives (From admission, onward)    Start     Dose/Rate Route Frequency Ordered Stop   02/23/22 1900  ampicillin (OMNIPEN) 2 g in sodium chloride 0.9 % 100 mL IVPB        2 g 300 mL/hr over 20 Minutes Intravenous Every 8 hours 02/23/22 1059     02/21/22 1000  ampicillin (OMNIPEN) 2 g in sodium chloride 0.9 % 100 mL IVPB  Status:  Discontinued        2 g 300 mL/hr over 20 Minutes Intravenous Every 6 hours 02/21/22 0940 02/23/22 1059   02/21/22 0300  ampicillin (OMNIPEN) 2 g in sodium chloride 0.9 % 100 mL IVPB  Status:  Discontinued        2 g 300 mL/hr over 20 Minutes Intravenous Every 8 hours 02/21/22 0215 02/21/22 0940   02/20/22 0815  vancomycin (VANCOREADY) IVPB 1250 mg/250 mL  Status:  Discontinued        1,250 mg 166.7 mL/hr over 90 Minutes Intravenous Every 48 hours 02/20/22 0715 02/21/22 0215   02/20/22 0800  ceFEPIme (MAXIPIME) 2 g in sodium chloride 0.9 % 100 mL IVPB  Status:  Discontinued        2 g 200 mL/hr over 30 Minutes Intravenous Every 24 hours 02/20/22 0643 02/21/22 0215   02/20/22 0800  vancomycin (VANCOREADY) IVPB 1750 mg/350 mL  Status:  Discontinued        1,750 mg 175 mL/hr over 120 Minutes Intravenous Every 48 hours 02/20/22 0703 02/20/22 0715       Medications: Scheduled Meds:  amLODipine  2.5 mg Per NG tube Daily   aspirin  81 mg Per Tube Daily   atorvastatin  40 mg Per Tube Daily   chlorhexidine  15 mL Mouth Rinse BID   feeding supplement (PROSource TF)  45 mL Per Tube BID   free water  100 mL Per Tube Q4H   guaiFENesin-dextromethorphan  5 mL Per Tube Q8H   heparin  5,000 Units Subcutaneous Q8H   insulin aspart  0-9 Units Subcutaneous Q4H   insulin aspart  4 Units Subcutaneous Q4H   insulin  glargine-yfgn  12 Units Subcutaneous Daily   metoprolol tartrate  25 mg Per Tube BID   mirtazapine  7.5 mg Per Tube QHS   multivitamin with minerals  1 tablet Per Tube Daily   ondansetron  4 mg Per Tube Q8H   Or   ondansetron (ZOFRAN) IV  4 mg Intravenous Q8H   pantoprazole sodium  40 mg Per Tube BID   Tbo-filgastrim (GRANIX) SQ  300 mcg Subcutaneous q1800   thiamine  100 mg Per Tube Daily   Continuous Infusions:  sodium chloride Stopped (02/20/22 0850)   sodium chloride 100 mL/hr at 02/24/22 1029   ampicillin (OMNIPEN) IV 2 g (02/24/22 1037)   feeding supplement (OSMOLITE 1.5 CAL) 1,000 mL (02/24/22 0754)   PRN Meds:.sodium chloride, acetaminophen, labetalol, liver oil-zinc oxide, prochlorperazine    Objective: Weight change:   Intake/Output Summary (Last 24 hours) at 02/24/2022 1550 Last data filed at 02/24/2022 1149 Gross per 24 hour  Intake 3626.25 ml  Output 652 ml  Net 2974.25 ml    Blood pressure (!) 129/54, pulse 99, temperature 98 F (36.7 C), temperature source Oral, resp. rate 16,  height '5\' 7"'$  (1.702 m), weight 55.8 kg, SpO2 100 %. Temp:  [98 F (36.7 C)-99.1 F (37.3 C)] 98 F (36.7 C) (05/30 1318) Pulse Rate:  [99-113] 99 (05/30 1318) Resp:  [16-19] 16 (05/30 1318) BP: (124-148)/(49-56) 129/54 (05/30 1318) SpO2:  [97 %-100 %] 100 % (05/30 1318)  Physical Exam: Physical Exam HENT:     Head: Normocephalic and atraumatic.  Cardiovascular:     Heart sounds: No murmur heard.   No friction rub. No gallop.  Pulmonary:     Effort: Pulmonary effort is normal. No respiratory distress.     Breath sounds: No stridor. No wheezing, rhonchi or rales.  Abdominal:     General: Abdomen is flat. Bowel sounds are normal. There is no distension.  Musculoskeletal:     Cervical back: Normal range of motion.  Neurological:     General: No focal deficit present.     Mental Status: He is alert and oriented to person, place, and time.  Psychiatric:        Mood and Affect:  Mood normal.        Behavior: Behavior normal.        Thought Content: Thought content normal.        Judgment: Judgment normal.     CBC:    BMET Recent Labs    02/23/22 0542 02/24/22 0329  NA 134* 138  K 3.8 4.0  CL 104 108  CO2 23 21*  GLUCOSE 353* 304*  BUN 48* 61*  CREATININE 2.06* 2.56*  CALCIUM 7.2* 7.1*      Liver Panel  Recent Labs    02/22/22 0637 02/23/22 0542  PROT 4.4* 4.2*  ALBUMIN 1.9* 2.0*  AST 32 29  ALT 29 31  ALKPHOS 82 79  BILITOT 0.6 0.6        Sedimentation Rate No results for input(s): ESRSEDRATE in the last 72 hours. C-Reactive Protein No results for input(s): CRP in the last 72 hours.  Micro Results: Recent Results (from the past 720 hour(s))  Culture, blood (Routine x 2)     Status: None   Collection Time: 02/09/22 10:23 AM   Specimen: Right Antecubital; Blood  Result Value Ref Range Status   Specimen Description   Final    RIGHT ANTECUBITAL Performed at [facility], [address] [place], [place] [address]    Special Requests   Final    BOTTLES DRAWN AEROBIC AND ANAEROBIC Blood Culture adequate volume Performed at [facility], [address], [place], [place] [address]    Culture   Final    NO GROWTH 5 DAYS Performed at [facility], [place] [address] [place] [address], [place], [place] [address]    Report Status 02/14/2022 FINAL  Final  Culture, blood (Routine x 2)     Status: None   Collection Time: 02/09/22 10:23 AM   Specimen: BLOOD LEFT FOREARM  Result Value Ref Range Status   Specimen Description   Final    BLOOD LEFT FOREARM Performed at [facility], [address], [place], [place] [address]    Special Requests   Final    BOTTLES DRAWN AEROBIC AND ANAEROBIC Blood Culture adequate volume Performed at [facility], [address], [place], [place] [address]    Culture   Final    NO GROWTH 5 DAYS Performed at [facility],  [place] [address], [place], [place] [address]    Report Status 02/14/2022 FINAL  Final  Urine Culture  Status: None   Collection Time: 02/09/22 10:23 AM   Specimen: Urine, Clean Catch  Result Value Ref Range Status   Specimen Description   Final    URINE, CLEAN CATCH Performed at [facility], [name] [place], [place] [address]    Special Requests   Final    NONE Performed at [facility], [address], [place], [place] [address]    Culture   Final    NO GROWTH Performed at [facility], [place] [address] [place], [place] [address]    Report Status 02/10/2022 FINAL  Final  MRSA Next Gen by PCR, Nasal     Status: None   Collection Time: 02/09/22  2:39 PM   Specimen: Nasal Mucosa; Nasal Swab  Result Value Ref Range Status   MRSA by PCR Next Gen NOT DETECTED NOT DETECTED Final    Comment: (NOTE) The GeneXpert MRSA Assay (FDA approved for NASAL specimens only), is one component of a comprehensive MRSA colonization surveillance program. It is not intended to diagnose MRSA infection nor to guide or monitor treatment for MRSA infections. Test performance is not FDA approved in patients less than 70 years old. Performed at [facility], [place] [address], [place], [place] [address]   Culture, blood (Routine X 2) w Reflex to ID Panel     Status: Abnormal   Collection Time: 02/20/22  7:50 AM   Specimen: BLOOD  Result Value Ref Range Status   Specimen Description   Final    BLOOD LEFT ANTECUBITAL Performed at [facility] [address], [place], [place] [address]    Special Requests   Final    BOTTLES DRAWN AEROBIC AND ANAEROBIC Blood Culture adequate volume Performed at [facility] [address], [place], [place] [address]    Culture  Setup Time   Final    GRAM POSITIVE COCCI IN BOTH AEROBIC AND ANAEROBIC BOTTLES CRITICAL RESULT CALLED TO, READ BACK BY AND VERIFIED WITH:  E JACKSON,PHARMD'@0200'$  02/21/22 [place] Performed at [facility], [address] [place],  [address]    Culture ENTEROCOCCUS FAECIUM (A)  Final   Report Status 02/22/2022 FINAL  Final   Organism ID, Bacteria ENTEROCOCCUS FAECIUM  Final      Susceptibility   Enterococcus faecium - MIC*    AMPICILLIN <=2 SENSITIVE Sensitive     VANCOMYCIN <=0.5 SENSITIVE Sensitive     GENTAMICIN SYNERGY SENSITIVE Sensitive     * ENTEROCOCCUS FAECIUM  Blood Culture ID Panel (Reflexed)     Status: Abnormal   Collection Time: 02/20/22  7:50 AM  Result Value Ref Range Status   Enterococcus faecalis NOT DETECTED NOT DETECTED Final   Enterococcus Faecium DETECTED (A) NOT DETECTED Final    Comment: CRITICAL RESULT CALLED TO, READ BACK BY AND VERIFIED WITH: E JACKSON,PHARMD'@0200'$  02/21/22 [place]    Listeria monocytogenes NOT DETECTED NOT DETECTED Final   Staphylococcus species NOT DETECTED NOT DETECTED Final   Staphylococcus aureus (BCID) NOT DETECTED NOT DETECTED Final   Staphylococcus epidermidis NOT DETECTED NOT DETECTED Final   Staphylococcus lugdunensis NOT DETECTED NOT DETECTED Final   Streptococcus species NOT DETECTED NOT DETECTED Final   Streptococcus agalactiae NOT DETECTED NOT DETECTED Final   Streptococcus pneumoniae NOT DETECTED NOT DETECTED Final   Streptococcus pyogenes NOT DETECTED NOT DETECTED Final   A.calcoaceticus-baumannii NOT DETECTED NOT DETECTED Final   Bacteroides fragilis NOT DETECTED NOT DETECTED Final   Enterobacterales NOT DETECTED NOT DETECTED  Final   Enterobacter cloacae complex NOT DETECTED NOT DETECTED Final   Escherichia coli NOT DETECTED NOT DETECTED Final   Klebsiella aerogenes NOT DETECTED NOT DETECTED Final   Klebsiella oxytoca NOT DETECTED NOT DETECTED Final   Klebsiella pneumoniae NOT DETECTED NOT DETECTED Final   Proteus species NOT DETECTED NOT DETECTED Final   Salmonella species NOT DETECTED NOT DETECTED Final   Serratia marcescens NOT DETECTED NOT DETECTED  Final   Haemophilus influenzae NOT DETECTED NOT DETECTED Final   Neisseria meningitidis NOT DETECTED NOT DETECTED Final   Pseudomonas aeruginosa NOT DETECTED NOT DETECTED Final   Stenotrophomonas maltophilia NOT DETECTED NOT DETECTED Final   Candida albicans NOT DETECTED NOT DETECTED Final   Candida auris NOT DETECTED NOT DETECTED Final   Candida glabrata NOT DETECTED NOT DETECTED Final   Candida krusei NOT DETECTED NOT DETECTED Final   Candida parapsilosis NOT DETECTED NOT DETECTED Final   Candida tropicalis NOT DETECTED NOT DETECTED Final   Cryptococcus neoformans/gattii NOT DETECTED NOT DETECTED Final   Vancomycin resistance NOT DETECTED NOT DETECTED Final    Comment: Performed at [facility], [place] [address], [place], [place] [address]  Culture, blood (Routine X 2) w Reflex to ID Panel     Status: Abnormal   Collection Time: 02/20/22  7:54 AM   Specimen: BLOOD  Result Value Ref Range Status   Specimen Description   Final    BLOOD BLOOD RIGHT FOREARM Performed at [facility] [address], [place], [place] [address]    Special Requests   Final    BOTTLES DRAWN AEROBIC AND ANAEROBIC Blood Culture adequate volume Performed at [place] [place] [address], [place], [place] [address]    Culture  Setup Time   Final    GRAM POSITIVE COCCI IN BOTH AEROBIC AND ANAEROBIC BOTTLES CRITICAL VALUE NOTED.  VALUE IS CONSISTENT WITH PREVIOUSLY REPORTED AND CALLED VALUE.    Culture (A)  Final    ENTEROCOCCUS FAECIUM SUSCEPTIBILITIES PERFORMED ON PREVIOUS CULTURE WITHIN THE LAST 5 DAYS. Performed at [facility], [place] [address] [place], [place] [address]    Report Status 02/23/2022 FINAL  Final  Culture, blood (Routine X 2) w Reflex to ID Panel     Status: None (Preliminary result)   Collection Time: 02/21/22  6:32 AM   Specimen: BLOOD  Result Value Ref Range Status   Specimen Description   Final    BLOOD BLOOD RIGHT FOREARM Performed  at [facility] [address], [place], [place] [address]    Special Requests   Final    BOTTLES DRAWN AEROBIC ONLY Blood Culture adequate volume Performed at [facility] [address], [place], [place] [address]    Culture   Final    NO GROWTH 3 DAYS Performed at [facility], [place] [address] [place], [place] [address]    Report Status PENDING  Incomplete  Culture, blood (Routine X 2) w Reflex to ID Panel     Status: None (Preliminary result)   Collection Time: 02/21/22  6:32 AM   Specimen: BLOOD  Result Value Ref Range Status   Specimen Description   Final    BLOOD BLOOD RIGHT HAND Performed at [place] [address], [place], [place] [address]    Special Requests   Final    IN PEDIATRIC BOTTLE Blood Culture results may not be optimal due to an inadequate volume of blood received in  culture bottles Performed at [facility] [address], [place], [place] [address]    Culture   Final    NO GROWTH 3 DAYS Performed at [facility], [place] [address], [place], [place] [address]    Report Status PENDING  Incomplete    Studies/Results: Reviewed/incorporated into medical decision making  DG CHEST PORT 1 VIEW  Result Date: 02/23/2022 CLINICAL DATA:  Weakness. Colon cancer and chronic lymphocytic leukemia. EXAM: PORTABLE CHEST 1 VIEW COMPARISON:  02/19/2022 FINDINGS: The heart size and mediastinal contours are within normal limits. Aortic atherosclerotic calcification incidentally noted. A Dobbhoff feeding tube is seen in place with the tip in the proximal stomach. Low lung volumes are seen with bibasilar atelectasis, new since prior exam. IMPRESSION: Low lung volumes with bibasilar atelectasis. Dobbhoff feeding tube tip in proximal stomach. Electronically Signed   By: Marlaine Hind M.D.   On: 02/23/2022 10:30    5/27 tte  1. Strain abnormal -11.5 but poor tracking not reported EF is   hyperdynamic.   2. Left ventricular ejection fraction, by estimation, is 60 to 65%. The  left ventricle has normal function. The left ventricle has no regional  wall motion abnormalities. Left ventricular diastolic parameters were  normal.   3. Right ventricular systolic function is normal. The right ventricular  size is normal.   4. The pericardial effusion is posterior to the left ventricle and  anterior to the right ventricle.   5. The mitral valve is normal in structure. No evidence of mitral valve  regurgitation. No evidence of mitral stenosis.   6. Nodular calcification of right coronary cusp likely sclerosis If  suspicion of SBE high consider TEE. The aortic valve is normal in  structure. There is moderate calcification of the aortic valve. There is  moderate thickening of the aortic valve.  Aortic valve regurgitation is not visualized. Aortic valve  sclerosis/calcification is present, without any evidence of aortic  stenosis.   7. The inferior vena cava is normal in size with greater than 50%  respiratory variability, suggesting right atrial pressure of 3 mmHg.     Assessment/Plan:  INTERVAL HISTORY: Blood cultures clearing  Principal Problem:   Bacteremia due to Enterococcus Active Problems:   Chronic lymphocytic leukemia (HCC)   Colon cancer (HCC)   Hypertension   Hypercholesterolemia   Diabetes mellitus (HCC)   Sepsis ([place])   Neutropenia in patient with hx of CLL   Protein-calorie malnutrition, severe (HCC)   Dehydration   Hyponatremia   Stage 3b chronic kidney disease (CKD) ([place])   Neutropenic fever (HCC)   Refeeding syndrome   Hypophosphatemia   CLL (chronic lymphocytic leukemia) ([place])   Failure to thrive in adult    [name] is a 81 y.o. male with  CLL with recurrence in context of coming off and on Imbruvaca with neutropenia admitted 5/25 with sepsis in setting of AMP S E faecium bacteremia  5/26 bcx high burden bacteremia 5/27 tte showing  area that could be endocarditis as well as a pericardial effusion  Pericardial effusion without clinical sign/sx of tamponade physiology  Denova score high in setting malignancy, duration of sickness (I wouldn't be able to tell how long he has been sick for), agree need to r/o endocarditis  He is on granulocyte growth factor per oncology and some improvement in wbc count but relapsed cll with pancytopenia concerning. No obvious sign of richter's transformation   -f/u repeat bcx final result -await tee -continue ampicillin for now -  final abx plan/duration after tee available -discussed with primary team      I spent more than 50 minute reviewing data/chart, and coordinating care and >50% direct face to face time providing counseling/discussing diagnostics/treatment plan with patient     LOS: 4 days   Marietta Sikkema T Ikran Patman 02/24/2022, 3:50 PM

## 2022-02-24 NOTE — Progress Notes (Signed)
PROGRESS NOTE    [name]  VWU:[id number] DOB: 1940/10/06 DOA: 02/19/2022 PCP: [name], MD   Brief Narrative:  81 year old male with history of CLL initially diagnosed in 2014, status postchemotherapy and currently on Imbruvica, DM 2, HTN, colorectal cancer 2009, who came into the hospital with progressive and generalized weakness.  Patient has been experiencing some weight loss and poor appetite for the past several months, was seen by GI in February as an outpatient and underwent work-up without any significant major findings.  He was taken off Imbruvica in April as it was believed that it may be related to that, however he continued to decline.  He was hospitalized just 2 weeks ago for dehydration, and when he left the hospital he was placed back on his Imbruvica.  While at home he continued to decline and was rehospitalized on 5/25.  Repeat CT scan of the abdomen and pelvis showed recurrence of the CLL, he was also found to be neutropenic, have a fever and have Enterococcus bacteremia.  Oncology and ID consulted  Assessment & Plan:   Sepsis: Present on admission Enterococcus faecium bacteremia Neutropenic fever -2D echo showed normal EF of 60 to 65%, small pericardial effusion,  there was also a nodular calcification in the right coronary cusp likely sclerosis but if high suspicion for SBE, consider TEE.  ID following and recommending TEE.  TEE possibly scheduled for 02/26/2022 -currently on ampicillin.  No temperature spikes over the last 24 hours.  Blood pressure stable. -Repeat blood cultures from 02/21/2022 are negative so far  Goals of care Failure to thrive Dehydration/dysphagia/chronic cough CLL Physical deconditioning -overall difficult situation and complex case given progression of his CLL with progressive lymphadenopathy and now complicated by persistent neutropenia and Enterococcus bacteremia with concern for infectious endocarditis.   -Palliative care consultation  for goals of care discussion -I have asked Dr. Alen Blew to reevaluate him today. -Currently has NG feeding.  SLP still recommends n.p.o. except ice chips  Acute kidney injury on CKD stage IIIb -Baseline creatinine 1.4-1.6.  Worsening to 2.56 today.  Increase normal saline to 100 cc an hour.  Repeat a.m. labs.  Pancytopenia -Due to malignancy.  Receiving Granix as per oncology recommendations for neutropenic fever.  Monitor  Hyperlipidemia -Continue statin  Essential hypertension  -continue amlodipine, metoprolol  Hyperlipidemia -Continue statin  Diabetes mellitus type 2 with hyperglycemia -Continue long-acting insulin.  Add NovoLog 4 units every 4 hours and continue SSI.  Severe protein calorie malnutrition -Dietitian following for tube feedings   DVT prophylaxis: Heparin subcutaneous Code Status: Full Family Communication: None at bedside Disposition Plan: Status is: Inpatient Remains inpatient appropriate because: Of severity of illness  Consultants: ID/oncology  Procedures: 2D echo  Antimicrobials:  Anti-infectives (From admission, onward)    Start     Dose/Rate Route Frequency Ordered Stop   02/23/22 1900  ampicillin (OMNIPEN) 2 g in sodium chloride 0.9 % 100 mL IVPB        2 g 300 mL/hr over 20 Minutes Intravenous Every 8 hours 02/23/22 1059     02/21/22 1000  ampicillin (OMNIPEN) 2 g in sodium chloride 0.9 % 100 mL IVPB  Status:  Discontinued        2 g 300 mL/hr over 20 Minutes Intravenous Every 6 hours 02/21/22 0940 02/23/22 1059   02/21/22 0300  ampicillin (OMNIPEN) 2 g in sodium chloride 0.9 % 100 mL IVPB  Status:  Discontinued        2 g 300 mL/hr over 20  Minutes Intravenous Every 8 hours 02/21/22 0215 02/21/22 0940   02/20/22 0815  vancomycin (VANCOREADY) IVPB 1250 mg/250 mL  Status:  Discontinued        1,250 mg 166.7 mL/hr over 90 Minutes Intravenous Every 48 hours 02/20/22 0715 02/21/22 0215   02/20/22 0800  ceFEPIme (MAXIPIME) 2 g in sodium chloride  0.9 % 100 mL IVPB  Status:  Discontinued        2 g 200 mL/hr over 30 Minutes Intravenous Every 24 hours 02/20/22 0643 02/21/22 0215   02/20/22 0800  vancomycin (VANCOREADY) IVPB 1750 mg/350 mL  Status:  Discontinued        1,750 mg 175 mL/hr over 120 Minutes Intravenous Every 48 hours 02/20/22 0703 02/20/22 0715        Subjective: Patient seen and examined at bedside.  Poor historian.  No overnight fever, vomiting or seizures reported.  Objective: Vitals:   02/23/22 1853 02/23/22 1930 02/24/22 0530 02/24/22 0906  BP: (!) 125/49 (!) 124/56 (!) 148/55 (!) 133/50  Pulse: (!) 110 (!) 113 (!) 111 (!) 110  Resp: '18 19 19 18  '$ Temp: 99.1 F (37.3 C) 98.5 F (36.9 C) 98.6 F (37 C) 98.9 F (37.2 C)  TempSrc: Oral Oral Oral Oral  SpO2: 97%  98% 98%  Weight:      Height:        Intake/Output Summary (Last 24 hours) at 02/24/2022 1106 Last data filed at 02/24/2022 1037 Gross per 24 hour  Intake 3761.88 ml  Output 952 ml  Net 2809.88 ml   Filed Weights   02/21/22 0420 02/21/22 0500 02/22/22 0424  Weight: 54.3 kg 55.2 kg 55.8 kg    Examination:  General exam: Appears calm and comfortable.  Currently on room air.  Looks chronically ill and deconditioned. ENT: NG tube present Respiratory system: Bilateral decreased breath sounds at bases with scattered crackles Cardiovascular system: S1 & S2 heard, tachycardic  gastrointestinal system: Abdomen is nondistended, soft and nontender. Normal bowel sounds heard. Extremities: No cyanosis, clubbing; trace lower extremity edema present Central nervous system: Awake, slow to respond, poor historian, no focal neurological deficits. Moving extremities Skin: No rashes, lesions or ulcers Psychiatry: Affect is flat.  No signs of agitation.   Data Reviewed: I have personally reviewed following labs and imaging studies  CBC: Recent Labs  Lab 02/19/22 0235 02/20/22 0310 02/21/22 5456 02/22/22 0637 02/23/22 0542 02/24/22 0329  WBC 1.2*  0.9* 0.9* 1.0* 1.6* 2.4*  NEUTROABS 0.1*  --  0.3* 0.3* 0.5* 0.8*  HGB 9.4* 8.6* 8.8* 8.6* 8.4* 8.8*  HCT 28.3* 26.1* 26.8* 27.1* 26.7* 29.0*  MCV 79.5* 81.8 83.0 84.4 85.9 88.7  PLT 195 148* 151 125* 127* 256*   Basic Metabolic Panel: Recent Labs  Lab 02/20/22 0310 02/21/22 3893 02/21/22 1657 02/22/22 0637 02/22/22 1645 02/23/22 0542 02/23/22 1722 02/24/22 0329  NA 132* 131*  --  129*  --  134*  --  138  K 3.7 3.4*  --  3.8  --  3.8  --  4.0  CL 101 100  --  100  --  104  --  108  CO2 22 23  --  22  --  23  --  21*  GLUCOSE 208* 247*  --  323*  --  353*  --  304*  BUN 34* 36*  --  36*  --  48*  --  61*  CREATININE 1.63* 1.54*  --  1.71*  --  2.06*  --  2.56*  CALCIUM 8.0* 7.7*  --  7.3*  --  7.2*  --  7.1*  MG  --  1.6*   < > 1.8 1.8 1.9 1.9 2.0  PHOS  --  2.7   < > 1.9* 3.6 3.4 2.7 2.4*   < > = values in this interval not displayed.   GFR: Estimated Creatinine Clearance: 18.5 mL/min (A) (by C-G formula based on SCr of 2.56 mg/dL (H)). Liver Function Tests: Recent Labs  Lab 02/19/22 0235 02/20/22 0310 02/21/22 9833 02/22/22 0637 02/23/22 0542  AST 10* 18 31 32 29  ALT '10 15 26 29 31  '$ ALKPHOS 103 79 78 82 79  BILITOT 0.9 1.1 0.8 0.6 0.6  PROT 6.2* 4.9* 4.7* 4.4* 4.2*  ALBUMIN 3.5 2.4* 2.2* 1.9* 2.0*   No results for input(s): LIPASE, AMYLASE in the last 168 hours. No results for input(s): AMMONIA in the last 168 hours. Coagulation Profile: No results for input(s): INR, PROTIME in the last 168 hours. Cardiac Enzymes: No results for input(s): CKTOTAL, CKMB, CKMBINDEX, TROPONINI in the last 168 hours. BNP (last 3 results) No results for input(s): PROBNP in the last 8760 hours. HbA1C: No results for input(s): HGBA1C in the last 72 hours. CBG: Recent Labs  Lab 02/23/22 0744 02/23/22 1349 02/23/22 1925 02/24/22 0211 02/24/22 0736  GLUCAP 378* 305* 308* 304* 303*   Lipid Profile: No results for input(s): CHOL, HDL, LDLCALC, TRIG, CHOLHDL, LDLDIRECT in the  last 72 hours. Thyroid Function Tests: No results for input(s): TSH, T4TOTAL, FREET4, T3FREE, THYROIDAB in the last 72 hours. Anemia Panel: No results for input(s): VITAMINB12, FOLATE, FERRITIN, TIBC, IRON, RETICCTPCT in the last 72 hours. Sepsis Labs: Recent Labs  Lab 02/19/22 0235 02/19/22 0927  LATICACIDVEN 3.4* 1.8    Recent Results (from the past 240 hour(s))  Culture, blood (Routine X 2) w Reflex to ID Panel     Status: Abnormal   Collection Time: 02/20/22  7:50 AM   Specimen: BLOOD  Result Value Ref Range Status   Specimen Description   Final    BLOOD LEFT ANTECUBITAL Performed at [facility], [place] [address], [place], [place] [address]    Special Requests   Final    BOTTLES DRAWN AEROBIC AND ANAEROBIC Blood Culture adequate volume Performed at [facility] [address], [place], [place] [address]    Culture  Setup Time   Final    GRAM POSITIVE COCCI IN BOTH AEROBIC AND ANAEROBIC BOTTLES CRITICAL RESULT CALLED TO, READ BACK BY AND VERIFIED WITH: E JACKSON,PHARMD'@0200'$  02/21/22 [place] Performed at [facility], [place] [address] [place],  [address]    Culture ENTEROCOCCUS FAECIUM (A)  Final   Report Status 02/22/2022 FINAL  Final   Organism ID, Bacteria ENTEROCOCCUS FAECIUM  Final      Susceptibility   Enterococcus faecium - MIC*    AMPICILLIN <=2 SENSITIVE Sensitive     VANCOMYCIN <=0.5 SENSITIVE Sensitive     GENTAMICIN SYNERGY SENSITIVE Sensitive     * ENTEROCOCCUS FAECIUM  Blood Culture ID Panel (Reflexed)     Status: Abnormal   Collection Time: 02/20/22  7:50 AM  Result Value Ref Range Status   Enterococcus faecalis NOT DETECTED NOT DETECTED Final   Enterococcus Faecium DETECTED (A) NOT DETECTED Final    Comment: CRITICAL RESULT CALLED TO, READ BACK BY AND VERIFIED WITH: E JACKSON,PHARMD'@0200'$  02/21/22 [place]    Listeria monocytogenes NOT DETECTED NOT DETECTED Final   Staphylococcus species NOT  DETECTED NOT  DETECTED Final   Staphylococcus aureus (BCID) NOT DETECTED NOT DETECTED Final   Staphylococcus epidermidis NOT DETECTED NOT DETECTED Final   Staphylococcus lugdunensis NOT DETECTED NOT DETECTED Final   Streptococcus species NOT DETECTED NOT DETECTED Final   Streptococcus agalactiae NOT DETECTED NOT DETECTED Final   Streptococcus pneumoniae NOT DETECTED NOT DETECTED Final   Streptococcus pyogenes NOT DETECTED NOT DETECTED Final   A.calcoaceticus-baumannii NOT DETECTED NOT DETECTED Final   Bacteroides fragilis NOT DETECTED NOT DETECTED Final   Enterobacterales NOT DETECTED NOT DETECTED Final   Enterobacter cloacae complex NOT DETECTED NOT DETECTED Final   Escherichia coli NOT DETECTED NOT DETECTED Final   Klebsiella aerogenes NOT DETECTED NOT DETECTED Final   Klebsiella oxytoca NOT DETECTED NOT DETECTED Final   Klebsiella pneumoniae NOT DETECTED NOT DETECTED Final   Proteus species NOT DETECTED NOT DETECTED Final   Salmonella species NOT DETECTED NOT DETECTED Final   Serratia marcescens NOT DETECTED NOT DETECTED Final   Haemophilus influenzae NOT DETECTED NOT DETECTED Final   Neisseria meningitidis NOT DETECTED NOT DETECTED Final   Pseudomonas aeruginosa NOT DETECTED NOT DETECTED Final   Stenotrophomonas maltophilia NOT DETECTED NOT DETECTED Final   Candida albicans NOT DETECTED NOT DETECTED Final   Candida auris NOT DETECTED NOT DETECTED Final   Candida glabrata NOT DETECTED NOT DETECTED Final   Candida krusei NOT DETECTED NOT DETECTED Final   Candida parapsilosis NOT DETECTED NOT DETECTED Final   Candida tropicalis NOT DETECTED NOT DETECTED Final   Cryptococcus neoformans/gattii NOT DETECTED NOT DETECTED Final   Vancomycin resistance NOT DETECTED NOT DETECTED Final    Comment: Performed at [facility], [address] [place], [place] [address]  Culture, blood (Routine X 2) w Reflex to ID Panel     Status: Abnormal   Collection Time: 02/20/22  7:54 AM   Specimen: BLOOD   Result Value Ref Range Status   Specimen Description   Final    BLOOD BLOOD RIGHT FOREARM Performed at [facility] [address], [place], [place] [address]    Special Requests   Final    BOTTLES DRAWN AEROBIC AND ANAEROBIC Blood Culture adequate volume Performed at [facility] [address], [place], [place] [address]    Culture  Setup Time   Final    GRAM POSITIVE COCCI IN BOTH AEROBIC AND ANAEROBIC BOTTLES CRITICAL VALUE NOTED.  VALUE IS CONSISTENT WITH PREVIOUSLY REPORTED AND CALLED VALUE.    Culture (A)  Final    ENTEROCOCCUS FAECIUM SUSCEPTIBILITIES PERFORMED ON PREVIOUS CULTURE WITHIN THE LAST 5 DAYS. Performed at [facility], [place] [address] [place], [place] [address]    Report Status 02/23/2022 FINAL  Final  Culture, blood (Routine X 2) w Reflex to ID Panel     Status: None (Preliminary result)   Collection Time: 02/21/22  6:32 AM   Specimen: BLOOD  Result Value Ref Range Status   Specimen Description   Final    BLOOD BLOOD RIGHT FOREARM Performed at [facility] [address], [place], [place] [address]    Special Requests   Final    BOTTLES DRAWN AEROBIC ONLY Blood Culture adequate volume Performed at [facility] [address], [place], [place] [address]    Culture   Final    NO GROWTH 3 DAYS Performed at [facility], [place] [address], [place], [place] [address]    Report Status PENDING  Incomplete  Culture,  blood (Routine X 2) w Reflex to ID Panel     Status: None (Preliminary result)   Collection Time: 02/21/22  6:32 AM   Specimen: BLOOD  Result Value Ref Range Status   Specimen Description   Final    BLOOD BLOOD RIGHT HAND Performed at [facility] [address], [place], [place] [address]    Special Requests   Final    IN PEDIATRIC BOTTLE Blood Culture results may not be optimal due to an inadequate volume of blood received in culture  bottles Performed at [facility] [address], [place], [place] [address]    Culture   Final    NO GROWTH 3 DAYS Performed at [facility], [place] [address] [place], [place] [address]    Report Status PENDING  Incomplete         Radiology Studies: DG CHEST PORT 1 VIEW  Result Date: 02/23/2022 CLINICAL DATA:  Weakness. Colon cancer and chronic lymphocytic leukemia. EXAM: PORTABLE CHEST 1 VIEW COMPARISON:  02/19/2022 FINDINGS: The heart size and mediastinal contours are within normal limits. Aortic atherosclerotic calcification incidentally noted. A Dobbhoff feeding tube is seen in place with the tip in the proximal stomach. Low lung volumes are seen with bibasilar atelectasis, new since prior exam. IMPRESSION: Low lung volumes with bibasilar atelectasis. Dobbhoff feeding tube tip in proximal stomach. Electronically Signed   By: Marlaine Hind M.D.   On: 02/23/2022 10:30        Scheduled Meds:  amLODipine  2.5 mg Per NG tube Daily   aspirin  81 mg Per Tube Daily   atorvastatin  40 mg Per Tube Daily   chlorhexidine  15 mL Mouth Rinse BID   feeding supplement (PROSource TF)  45 mL Per Tube BID   free water  100 mL Per Tube Q4H   guaiFENesin-dextromethorphan  5 mL Per Tube Q8H   heparin  5,000 Units Subcutaneous Q8H   insulin aspart  0-9 Units Subcutaneous Q6H   insulin glargine-yfgn  12 Units Subcutaneous Daily   metoprolol tartrate  25 mg Per Tube BID   mirtazapine  7.5 mg Per Tube QHS   multivitamin with minerals  1 tablet Per Tube Daily   ondansetron  4 mg Per Tube Q8H   Or   ondansetron (ZOFRAN) IV  4 mg Intravenous Q8H   pantoprazole sodium  40 mg Per Tube BID   Tbo-filgastrim (GRANIX) SQ  300 mcg Subcutaneous q1800   thiamine  100 mg Per Tube Daily   Continuous Infusions:  sodium chloride Stopped (02/20/22 0850)   sodium chloride 100 mL/hr at 02/24/22 1029   ampicillin (OMNIPEN) IV 2 g (02/24/22 1037)   feeding supplement (OSMOLITE 1.5 CAL)  1,000 mL (02/24/22 0754)          Aline August, MD [facility] 02/24/2022, 11:06 AM

## 2022-02-24 NOTE — Progress Notes (Signed)
IP PROGRESS NOTE  Subjective:   Events noted.  Patient reports of some clinical improvement with the treating his underlying infection as well as tube feeding.  He denies any fevers, chills or sweats.  He continues to be overall weak and has not regained a lot of his strength.  Objective:  Vital signs in last 24 hours: Temp:  [98.5 F (36.9 C)-99.1 F (37.3 C)] 98.9 F (37.2 C) (05/30 0906) Pulse Rate:  [98-113] 110 (05/30 0906) Resp:  [16-19] 18 (05/30 0906) BP: (116-148)/(45-56) 133/50 (05/30 0906) SpO2:  [96 %-98 %] 98 % (05/30 0906) Weight change:  Last BM Date : 02/24/22  Intake/Output from previous day: 05/29 0701 - 05/30 0700 In: 2999.8 [I.V.:1104.1; NG/GT:1595.7; IV Piggyback:300] Out: 552 [Urine:550; Stool:2]    General appearance: Alert, awake without any distress. Head: Atraumatic without abnormalities Oropharynx: Without any thrush or ulcers. Eyes: No scleral icterus. Lymph nodes: No lymphadenopathy noted in the cervical, supraclavicular, or axillary nodes Heart:regular rate and rhythm, without any murmurs or gallops.   Lung: Clear to auscultation without any rhonchi, wheezes or dullness to percussion. Abdomin: Soft, nontender without any shifting dullness or ascites. Musculoskeletal: No clubbing or cyanosis. Neurological: No motor or sensory deficits. Skin: No rashes or lesions. P    Lab Results: Recent Labs    02/23/22 0542 02/24/22 0329  WBC 1.6* 2.4*  HGB 8.4* 8.8*  HCT 26.7* 29.0*  PLT 127* 120*    BMET Recent Labs    02/23/22 0542 02/24/22 0329  NA 134* 138  K 3.8 4.0  CL 104 108  CO2 23 21*  GLUCOSE 353* 304*  BUN 48* 61*  CREATININE 2.06* 2.56*  CALCIUM 7.2* 7.1*    Studies/Results: DG CHEST PORT 1 VIEW  Result Date: 02/23/2022 CLINICAL DATA:  Weakness. Colon cancer and chronic lymphocytic leukemia. EXAM: PORTABLE CHEST 1 VIEW COMPARISON:  02/19/2022 FINDINGS: The heart size and mediastinal contours are within normal limits.  Aortic atherosclerotic calcification incidentally noted. A Dobbhoff feeding tube is seen in place with the tip in the proximal stomach. Low lung volumes are seen with bibasilar atelectasis, new since prior exam. IMPRESSION: Low lung volumes with bibasilar atelectasis. Dobbhoff feeding tube tip in proximal stomach. Electronically Signed   By: Marlaine Hind M.D.   On: 02/23/2022 10:30    Medications: I have reviewed the patient's current medications.  Assessment/Plan:  81 year old with:  1.  CLL diagnosed in 2014.  He developed relapsed disease with pancytopenia as well as lymphadenopathy.  He is currently on Imbruvica.  Not sure he has developed refractory disease to it.  Alternative treatment options including venetoclax as systemic chemotherapy would be difficult options for him.  The natural course of this disease was reviewed again his prognosis was discussed.  If he responds on the current therapy and it would be reasonable to continue anticancer treatment.  He has no response and continues to clinically decline he would not be a candidate for any additional therapy.  I recommend continue supportive approach and the current treatment.  2.  Neutropenia: I recommended continue Granix for the time being given his recent bacteremia.  3.  Prognosis and goals of care: His prognosis is guarded given his overall debilitation and cancer progression.  He has DNR although I do not recommend stopping the level of care he is receiving.  He develops further decline this could be addressed further.  4.  GI concerns: He is receiving adequate feeding via NG tube.  Certainly this will not be  sustainable moving forward.  This is likely related to his malignancy.  5.  Infectious disease considerations: Enterococcus bacteremia currently on antibiotics.  TEE is to evaluate for possible endocarditis.  35  minutes were spent on this encounter.  More than 50% of the time was dedicated to reviewing laboratory data,  disease status update, discussing treatment choices and overall prognosis.     LOS: 4 days   [name] 02/24/2022, 11:47 AM

## 2022-02-25 DIAGNOSIS — E871 Hypo-osmolality and hyponatremia: Secondary | ICD-10-CM

## 2022-02-25 DIAGNOSIS — B952 Enterococcus as the cause of diseases classified elsewhere: Secondary | ICD-10-CM | POA: Diagnosis not present

## 2022-02-25 DIAGNOSIS — R7881 Bacteremia: Secondary | ICD-10-CM | POA: Diagnosis not present

## 2022-02-25 DIAGNOSIS — N179 Acute kidney failure, unspecified: Secondary | ICD-10-CM | POA: Diagnosis not present

## 2022-02-25 DIAGNOSIS — C9110 Chronic lymphocytic leukemia of B-cell type not having achieved remission: Secondary | ICD-10-CM | POA: Diagnosis not present

## 2022-02-25 DIAGNOSIS — E860 Dehydration: Secondary | ICD-10-CM | POA: Diagnosis not present

## 2022-02-25 DIAGNOSIS — E878 Other disorders of electrolyte and fluid balance, not elsewhere classified: Secondary | ICD-10-CM

## 2022-02-25 LAB — CBC WITH DIFFERENTIAL/PLATELET
Abs Immature Granulocytes: 0.59 10*3/uL — ABNORMAL HIGH (ref 0.00–0.07)
Basophils Absolute: 0 10*3/uL (ref 0.0–0.1)
Basophils Relative: 1 %
Eosinophils Absolute: 0 10*3/uL (ref 0.0–0.5)
Eosinophils Relative: 1 %
HCT: 29 % — ABNORMAL LOW (ref 39.0–52.0)
Hemoglobin: 8.7 g/dL — ABNORMAL LOW (ref 13.0–17.0)
Immature Granulocytes: 38 %
Lymphocytes Relative: 34 %
Lymphs Abs: 0.6 10*3/uL — ABNORMAL LOW (ref 0.7–4.0)
MCH: 26.5 pg (ref 26.0–34.0)
MCHC: 30 g/dL (ref 30.0–36.0)
MCV: 88.4 fL (ref 80.0–100.0)
Monocytes Absolute: 0.1 10*3/uL (ref 0.1–1.0)
Monocytes Relative: 7 %
Neutro Abs: 0.3 10*3/uL — CL (ref 1.7–7.7)
Neutrophils Relative %: 19 %
Platelets: 87 10*3/uL — ABNORMAL LOW (ref 150–400)
RBC: 3.28 MIL/uL — ABNORMAL LOW (ref 4.22–5.81)
RDW: 18.2 % — ABNORMAL HIGH (ref 11.5–15.5)
WBC: 1.6 10*3/uL — ABNORMAL LOW (ref 4.0–10.5)
nRBC: 0 % (ref 0.0–0.2)

## 2022-02-25 LAB — COMPREHENSIVE METABOLIC PANEL
ALT: 27 U/L (ref 0–44)
AST: 30 U/L (ref 15–41)
Albumin: 1.7 g/dL — ABNORMAL LOW (ref 3.5–5.0)
Alkaline Phosphatase: 106 U/L (ref 38–126)
Anion gap: 10 (ref 5–15)
BUN: 67 mg/dL — ABNORMAL HIGH (ref 8–23)
CO2: 19 mmol/L — ABNORMAL LOW (ref 22–32)
Calcium: 6.7 mg/dL — ABNORMAL LOW (ref 8.9–10.3)
Chloride: 110 mmol/L (ref 98–111)
Creatinine, Ser: 2.61 mg/dL — ABNORMAL HIGH (ref 0.61–1.24)
GFR, Estimated: 24 mL/min — ABNORMAL LOW (ref >60)
Glucose, Bld: 221 mg/dL — ABNORMAL HIGH (ref 70–99)
Potassium: 4 mmol/L (ref 3.5–5.1)
Sodium: 139 mmol/L (ref 135–145)
Total Bilirubin: 0.5 mg/dL (ref 0.3–1.2)
Total Protein: 4 g/dL — ABNORMAL LOW (ref 6.5–8.1)

## 2022-02-25 LAB — GLUCOSE, CAPILLARY
Glucose-Capillary: 195 mg/dL — ABNORMAL HIGH (ref 70–99)
Glucose-Capillary: 200 mg/dL — ABNORMAL HIGH (ref 70–99)
Glucose-Capillary: 219 mg/dL — ABNORMAL HIGH (ref 70–99)
Glucose-Capillary: 229 mg/dL — ABNORMAL HIGH (ref 70–99)
Glucose-Capillary: 240 mg/dL — ABNORMAL HIGH (ref 70–99)
Glucose-Capillary: 277 mg/dL — ABNORMAL HIGH (ref 70–99)

## 2022-02-25 LAB — CULTURE, BLOOD (ROUTINE X 2): Special Requests: ADEQUATE

## 2022-02-25 LAB — MAGNESIUM: Magnesium: 1.9 mg/dL (ref 1.7–2.4)

## 2022-02-25 MED ORDER — ACETAMINOPHEN 325 MG PO TABS
650.0000 mg | ORAL_TABLET | Freq: Four times a day (QID) | ORAL | Status: DC | PRN
Start: 2022-02-25 — End: 2022-03-03
  Administered 2022-02-25 – 2022-02-28 (×3): 650 mg
  Filled 2022-02-25 (×3): qty 2

## 2022-02-25 MED ORDER — FREE WATER
150.0000 mL | Status: DC
Start: 2022-02-25 — End: 2022-03-03
  Administered 2022-02-25 – 2022-02-28 (×18): 150 mL

## 2022-02-25 MED ORDER — ZINC SULFATE 220 (50 ZN) MG PO CAPS
220.0000 mg | ORAL_CAPSULE | Freq: Every day | ORAL | Status: DC
Start: 2022-02-25 — End: 2022-03-03
  Administered 2022-02-25 – 2022-02-28 (×4): 220 mg
  Filled 2022-02-25 (×4): qty 1

## 2022-02-25 MED ORDER — INSULIN GLARGINE-YFGN 100 UNIT/ML ~~LOC~~ SOLN
15.0000 [IU] | Freq: Every day | SUBCUTANEOUS | Status: DC
  Administered 2022-02-26 – 2022-02-28 (×3): 15 [IU] via SUBCUTANEOUS
  Filled 2022-02-25 (×4): qty 0.15

## 2022-02-25 MED ORDER — NUTRISOURCE FIBER PO PACK
1.0000 | PACK | Freq: Three times a day (TID) | ORAL | Status: DC
  Administered 2022-02-25 – 2022-03-02 (×6): 1
  Filled 2022-02-25 (×7): qty 1

## 2022-02-25 MED ORDER — INSULIN ASPART 100 UNIT/ML IJ SOLN
6.0000 [IU] | INTRAMUSCULAR | Status: DC
  Administered 2022-02-25 – 2022-02-28 (×19): 6 [IU] via SUBCUTANEOUS

## 2022-02-25 NOTE — Progress Notes (Signed)
Speech Language Pathology Treatment:    Patient Details Name: [name] MRN: [id number] DOB: 02-16-1941 Today's Date: 02/25/2022 Time: 7078-6754 SLP Time Calculation (min) (ACUTE ONLY): 26 min  Assessment / Plan / Recommendation Clinical Impression  Session focused on dysphagia goals including use of RMST and po of ice chips after oral care.  Pt reports he is consuming ice.  voice is occasionally gurgly during sesson and he benefits from cues to cough/throat clear and re-swallow.  Pt completed 10 repetitions of RMST with report of level 8/10 effort. He did appear more fatigued with less effort on 3/10 repetitions.  Ice consumption completed after oral care - however pt with immediate coughing/wet voice and admitted to sensing retention in phayrnx.  SLP advised that IF pt wants longer term feeding tube, would recommend he have PEG placed and THEN have repeat MBS.  Small bore NG tube will contribute to baseline deficits.  At this time, pt stated he is leaning toward NO TUBE - as he does not like the side effects of diarrhea and sore bottom.  He does endorse that eating PTA was not enjoyable and he had to force himself to eat. He states food does not taste good and admits that dysphagia may contribute to decreased intake. Pt required moderate verbal/visual cues to precautions using teach back. Wife was not present during session.  SLP also reviewed with pt that aspiration is a contributing risk factor to pneumonias but  does not warranty a patient will get pna.     HPI HPI: 81yo male admitted 02/19/22 with generalized weakness, recurrent falls. PMH: CLL, DM2, HTN, colorectal cancer (2009), HLD. Recent hospitalization due to poor appetite/intake, FTT.  Pt had COVID and has had cough since his COVID.  MBS completed with recommendation for NPO.  Pt has receiving swallow treatment.      SLP Plan  Continue with current plan of care;MBS (consider repeat MBS when indicated)      Recommendations for  follow up therapy are one component of a multi-disciplinary discharge planning process, led by the attending physician.  Recommendations may be updated based on patient status, additional functional criteria and insurance authorization.    Recommendations  Diet recommendations: NPO (ICE) Medication Administration: Via alternative means Compensations: Slow rate (ICE CHIPS) Postural Changes and/or Swallow Maneuvers: Seated upright 90 degrees;Upright 30-60 min after meal                Follow Up Recommendations: Home health SLP Assistance recommended at discharge: Frequent or constant Supervision/Assistance SLP Visit Diagnosis: Dysphagia, oropharyngeal phase (R13.12) Plan: Continue with current plan of care;MBS (consider repeat MBS when indicated)          Kathleen Lime, MS [place] Office [phone] Pager [phone]  [name]  02/25/2022, 5:32 PM

## 2022-02-25 NOTE — Care Management Important Message (Signed)
Important Message  Patient Details IM Letter placed in Patients room. Name: [name] MRN: [id number] Date of Birth: 10-12-1940   Medicare Important Message Given:  Yes     Kerin Salen 02/25/2022, 11:53 AM

## 2022-02-25 NOTE — Progress Notes (Addendum)
    [place] has been requested to perform a transesophageal echocardiogram on [name] for bacteremia. Per chart review, patient has a past medical history of CLL on Imbruvica, type 2 DM, hypertension, colorectal cancer in 2009, hyperlipidemia. Patient presented to the hospital on 5/25 complaining of generalized weakness and recurrent falls. Also complained of nausea, decreased appetite. Was admitted for treatment of sepsis, blood cultures were positive for enterococcus faecium. Echocardiogram on 5/27 showed EF 76-28%, normal diastolic parameters, normal RV systolic function, and a nodular calcification of the right coronary cusp of the aortic valve. Commented that this is likely sclerosis, but if suspicion of SBE should consider TEE. Infectious disease requested TEE.    Of note, patient is followed by Palliative Care. Patient also reported that he was having difficulty swallowing and getting food down PTA, is doing better with a feeding tube. Was evaluated by Speech Pathology on 5/26-- mild oral, moderate-sever pharyngeal dysphagia. Due to his swallowing difficulties, patient is not a candidate for TEE at this time.   Margie Billet, PA-C 02/25/2022 10:38 AM    Seen at the bedside - discussed echo findings with patient. No clear endocarditis, however, given the nodularity of the aortic valve and +blood cultures, this cannot be excluded - he is not an ideal TEE candidate with increased bleeding risk due to low platelets and history of oropharyngeal dysphagia. E. Faecium is not a typical endocarditis organism. D/w Dr. Gale Journey. Will discuss further with patient about options or whether further imaging is needed.  Pixie Casino, MD, [facility], [place] Director of the [facility] Lipid Disorders &  Cardiovascular Risk Reduction Clinic Diplomate of the American Board of Clinical Lipidology Attending Cardiologist  Direct Dial:  [phone]  Fax: [phone]  Website:  [email or web address]

## 2022-02-25 NOTE — Progress Notes (Signed)
Subjective: Afebrile Weak/dysphagic (food via dobhoff) Repeat bcx ngtd No n/v/diarrhea   Discussed tte finding with cardiology -- chronic changes no valve erosion, normal valve functioning otherwise, no abnormal regurgitation     Antibiotics:  Anti-infectives (From admission, onward)    Start     Dose/Rate Route Frequency Ordered Stop   02/23/22 1900  ampicillin (OMNIPEN) 2 g in sodium chloride 0.9 % 100 mL IVPB        2 g 300 mL/hr over 20 Minutes Intravenous Every 8 hours 02/23/22 1059     02/21/22 1000  ampicillin (OMNIPEN) 2 g in sodium chloride 0.9 % 100 mL IVPB  Status:  Discontinued        2 g 300 mL/hr over 20 Minutes Intravenous Every 6 hours 02/21/22 0940 02/23/22 1059   02/21/22 0300  ampicillin (OMNIPEN) 2 g in sodium chloride 0.9 % 100 mL IVPB  Status:  Discontinued        2 g 300 mL/hr over 20 Minutes Intravenous Every 8 hours 02/21/22 0215 02/21/22 0940   02/20/22 0815  vancomycin (VANCOREADY) IVPB 1250 mg/250 mL  Status:  Discontinued        1,250 mg 166.7 mL/hr over 90 Minutes Intravenous Every 48 hours 02/20/22 0715 02/21/22 0215   02/20/22 0800  ceFEPIme (MAXIPIME) 2 g in sodium chloride 0.9 % 100 mL IVPB  Status:  Discontinued        2 g 200 mL/hr over 30 Minutes Intravenous Every 24 hours 02/20/22 0643 02/21/22 0215   02/20/22 0800  vancomycin (VANCOREADY) IVPB 1750 mg/350 mL  Status:  Discontinued        1,750 mg 175 mL/hr over 120 Minutes Intravenous Every 48 hours 02/20/22 0703 02/20/22 0715       Medications: Scheduled Meds:  amLODipine  2.5 mg Per NG tube Daily   aspirin  81 mg Per Tube Daily   atorvastatin  40 mg Per Tube Daily   chlorhexidine  15 mL Mouth Rinse BID   feeding supplement (PROSource TF)  45 mL Per Tube BID   fiber  1 packet Per Tube TID   free water  150 mL Per Tube Q4H   guaiFENesin-dextromethorphan  5 mL Per Tube Q8H   heparin  5,000 Units Subcutaneous Q8H   insulin aspart  0-9 Units Subcutaneous Q4H   insulin  aspart  6 Units Subcutaneous Q4H   [START ON 02/26/2022] insulin glargine-yfgn  15 Units Subcutaneous Daily   metoprolol tartrate  25 mg Per Tube BID   mirtazapine  7.5 mg Per Tube QHS   multivitamin with minerals  1 tablet Per Tube Daily   ondansetron  4 mg Per Tube Q8H   Or   ondansetron (ZOFRAN) IV  4 mg Intravenous Q8H   pantoprazole sodium  40 mg Per Tube BID   thiamine  100 mg Per Tube Daily   zinc sulfate  220 mg Per Tube Daily   Continuous Infusions:  sodium chloride Stopped (02/20/22 0850)   ampicillin (OMNIPEN) IV 2 g (02/25/22 0920)   feeding supplement (OSMOLITE 1.5 CAL) 1,000 mL (02/25/22 0309)   PRN Meds:.sodium chloride, acetaminophen, labetalol, liver oil-zinc oxide, prochlorperazine    Objective: Weight change:   Intake/Output Summary (Last 24 hours) at 02/25/2022 1350 Last data filed at 02/25/2022 1225 Gross per 24 hour  Intake 1935.37 ml  Output 1100 ml  Net 835.37 ml    Blood pressure (!) 123/51, pulse 97, temperature 97.7 F (36.5 C),  temperature source Oral, resp. rate 20, height '5\' 7"'$  (1.702 m), weight 61.4 kg, SpO2 100 %. Temp:  [97.7 F (36.5 C)-98.6 F (37 C)] 97.7 F (36.5 C) (05/31 1306) Pulse Rate:  [97-110] 97 (05/31 1306) Resp:  [16-20] 20 (05/31 1306) BP: (123-146)/(51-55) 123/51 (05/31 1306) SpO2:  [99 %-100 %] 100 % (05/31 1306) Weight:  [61.4 kg] 61.4 kg (05/31 1158)  Physical Exam: General/constitutional: no distress, pleasant; conversant HEENT: Normocephalic, PER, Conj Clear, EOMI, Oropharynx clear; dobhoff tube in place Neck supple CV: rrr no mrg Lungs: clear to auscultation, normal respiratory effort Abd: Soft, Nontender Ext: trace bilateral edema Skin: No Rash Neuro: generalized weakness MSK: no peripheral joint swelling/tenderness/warmth     CBC: Lab Results  Component Value Date   WBC 1.6 (L) 02/25/2022   HGB 8.7 (L) 02/25/2022   HCT 29.0 (L) 02/25/2022   MCV 88.4 02/25/2022   PLT 87 (L) 02/25/2022       BMET Recent Labs    02/24/22 0329 02/25/22 0424  NA 138 139  K 4.0 4.0  CL 108 110  CO2 21* 19*  GLUCOSE 304* 221*  BUN 61* 67*  CREATININE 2.56* 2.61*  CALCIUM 7.1* 6.7*      Liver Panel  Recent Labs    02/23/22 0542 02/25/22 0424  PROT 4.2* 4.0*  ALBUMIN 2.0* 1.7*  AST 29 30  ALT 31 27  ALKPHOS 79 106  BILITOT 0.6 0.5        Sedimentation Rate No results for input(s): ESRSEDRATE in the last 72 hours. C-Reactive Protein No results for input(s): CRP in the last 72 hours.  Micro Results: Recent Results (from the past 720 hour(s))  Culture, blood (Routine x 2)     Status: None   Collection Time: 02/09/22 10:23 AM   Specimen: Right Antecubital; Blood  Result Value Ref Range Status   Specimen Description   Final    RIGHT ANTECUBITAL Performed at [facility], [address], [place], [place] [address]    Special Requests   Final    BOTTLES DRAWN AEROBIC AND ANAEROBIC Blood Culture adequate volume Performed at [facility], [address], [place], [place] [address]    Culture   Final    NO GROWTH 5 DAYS Performed at [facility], [place] [address], [place], [place] [address]    Report Status 02/14/2022 FINAL  Final  Culture, blood (Routine x 2)     Status: None   Collection Time: 02/09/22 10:23 AM   Specimen: BLOOD LEFT FOREARM  Result Value Ref Range Status   Specimen Description   Final    BLOOD LEFT FOREARM Performed at [facility], [address], [place], [place] [address]    Special Requests   Final    BOTTLES DRAWN AEROBIC AND ANAEROBIC Blood Culture adequate volume Performed at [facility], [address], [place], [place] [address]    Culture   Final    NO GROWTH 5 DAYS Performed at [facility], [place] [address], [place], [place] [address]    Report Status 02/14/2022 FINAL  Final  Urine Culture     Status: None   Collection Time:  02/09/22 10:23 AM   Specimen: Urine, Clean Catch  Result Value Ref Range Status   Specimen Description   Final    URINE, CLEAN CATCH Performed at [facility], [address] [place], [place] [address]    Special Requests   Final  NONE Performed at [facility], [address], [place], [place] [address]    Culture   Final    NO GROWTH Performed at [facility], [place] [address] [place], [place] [address]    Report Status 02/10/2022 FINAL  Final  MRSA Next Gen by PCR, Nasal     Status: None   Collection Time: 02/09/22  2:39 PM   Specimen: Nasal Mucosa; Nasal Swab  Result Value Ref Range Status   MRSA by PCR Next Gen NOT DETECTED NOT DETECTED Final    Comment: (NOTE) The GeneXpert MRSA Assay (FDA approved for NASAL specimens only), is one component of a comprehensive MRSA colonization surveillance program. It is not intended to diagnose MRSA infection nor to guide or monitor treatment for MRSA infections. Test performance is not FDA approved in patients less than 64 years old. Performed at [facility], [place] [address] [place], [place] [address]   Culture, blood (Routine X 2) w Reflex to ID Panel     Status: Abnormal   Collection Time: 02/20/22  7:50 AM   Specimen: BLOOD  Result Value Ref Range Status   Specimen Description   Final    BLOOD LEFT ANTECUBITAL Performed at [facility] [address], [place], [place] [address]    Special Requests   Final    BOTTLES DRAWN AEROBIC AND ANAEROBIC Blood Culture adequate volume Performed at [facility] [address], [place], [place] [address]    Culture  Setup Time   Final    GRAM POSITIVE COCCI IN BOTH AEROBIC AND ANAEROBIC BOTTLES CRITICAL RESULT CALLED TO, READ BACK BY AND VERIFIED WITH: E JACKSON,PHARMD'@0200'$  02/21/22 [place] Performed at [facility], [address], [place], [place] [address]    Culture  ENTEROCOCCUS FAECIUM (A)  Final   Report Status 02/22/2022 FINAL  Final   Organism ID, Bacteria ENTEROCOCCUS FAECIUM  Final      Susceptibility   Enterococcus faecium - MIC*    AMPICILLIN <=2 SENSITIVE Sensitive     VANCOMYCIN <=0.5 SENSITIVE Sensitive     GENTAMICIN SYNERGY SENSITIVE Sensitive     * ENTEROCOCCUS FAECIUM  Blood Culture ID Panel (Reflexed)     Status: Abnormal   Collection Time: 02/20/22  7:50 AM  Result Value Ref Range Status   Enterococcus faecalis NOT DETECTED NOT DETECTED Final   Enterococcus Faecium DETECTED (A) NOT DETECTED Final    Comment: CRITICAL RESULT CALLED TO, READ BACK BY AND VERIFIED WITH: E JACKSON,PHARMD'@0200'$  02/21/22 [place]    Listeria monocytogenes NOT DETECTED NOT DETECTED Final   Staphylococcus species NOT DETECTED NOT DETECTED Final   Staphylococcus aureus (BCID) NOT DETECTED NOT DETECTED Final   Staphylococcus epidermidis NOT DETECTED NOT DETECTED Final   Staphylococcus lugdunensis NOT DETECTED NOT DETECTED Final   Streptococcus species NOT DETECTED NOT DETECTED Final   Streptococcus agalactiae NOT DETECTED NOT DETECTED Final   Streptococcus pneumoniae NOT DETECTED NOT DETECTED Final   Streptococcus pyogenes NOT DETECTED NOT DETECTED Final   A.calcoaceticus-baumannii NOT DETECTED NOT DETECTED Final   Bacteroides fragilis NOT DETECTED NOT DETECTED Final   Enterobacterales NOT DETECTED NOT DETECTED Final   Enterobacter cloacae complex NOT DETECTED NOT DETECTED Final   Escherichia coli NOT DETECTED NOT DETECTED Final   Klebsiella aerogenes NOT DETECTED NOT DETECTED Final   Klebsiella oxytoca NOT DETECTED NOT DETECTED Final   Klebsiella pneumoniae NOT DETECTED NOT DETECTED Final   Proteus species NOT DETECTED NOT DETECTED Final  Salmonella species NOT DETECTED NOT DETECTED Final   Serratia marcescens NOT DETECTED NOT DETECTED Final   Haemophilus influenzae NOT DETECTED NOT DETECTED Final   Neisseria meningitidis NOT DETECTED NOT DETECTED Final    Pseudomonas aeruginosa NOT DETECTED NOT DETECTED Final   Stenotrophomonas maltophilia NOT DETECTED NOT DETECTED Final   Candida albicans NOT DETECTED NOT DETECTED Final   Candida auris NOT DETECTED NOT DETECTED Final   Candida glabrata NOT DETECTED NOT DETECTED Final   Candida krusei NOT DETECTED NOT DETECTED Final   Candida parapsilosis NOT DETECTED NOT DETECTED Final   Candida tropicalis NOT DETECTED NOT DETECTED Final   Cryptococcus neoformans/gattii NOT DETECTED NOT DETECTED Final   Vancomycin resistance NOT DETECTED NOT DETECTED Final    Comment: Performed at [facility], [address] [place], [place] [address]  Culture, blood (Routine X 2) w Reflex to ID Panel     Status: Abnormal   Collection Time: 02/20/22  7:54 AM   Specimen: BLOOD  Result Value Ref Range Status   Specimen Description   Final    BLOOD BLOOD RIGHT FOREARM Performed at [facility] [address] [place], [place] [address]    Special Requests   Final    BOTTLES DRAWN AEROBIC AND ANAEROBIC Blood Culture adequate volume Performed at [facility] [address] [place], [place] [address]    Culture  Setup Time   Final    GRAM POSITIVE COCCI IN BOTH AEROBIC AND ANAEROBIC BOTTLES CRITICAL VALUE NOTED.  VALUE IS CONSISTENT WITH PREVIOUSLY REPORTED AND CALLED VALUE.    Culture (A)  Final    ENTEROCOCCUS FAECIUM SUSCEPTIBILITIES PERFORMED ON PREVIOUS CULTURE WITHIN THE LAST 5 DAYS. Performed at [facility], [place] [address] [place], [place] [address]    Report Status 02/23/2022 FINAL  Final  Culture, blood (Routine X 2) w Reflex to ID Panel     Status: None (Preliminary result)   Collection Time: 02/21/22  6:32 AM   Specimen: BLOOD  Result Value Ref Range Status   Specimen Description   Final    BLOOD BLOOD RIGHT FOREARM Performed at [facility] [address] [place], [place] [address]    Special Requests   Final    BOTTLES  DRAWN AEROBIC ONLY Blood Culture adequate volume Performed at [facility] [address], [place], [place] [address]    Culture   Final    NO GROWTH 4 DAYS Performed at [facility], [place] [address], [place], [place] [address]    Report Status PENDING  Incomplete  Culture, blood (Routine X 2) w Reflex to ID Panel     Status: None (Preliminary result)   Collection Time: 02/21/22  6:32 AM   Specimen: BLOOD  Result Value Ref Range Status   Specimen Description   Final    BLOOD BLOOD RIGHT HAND Performed at [facility] [address] [place], [place] [address]    Special Requests   Final    IN PEDIATRIC BOTTLE Blood Culture results may not be optimal due to an inadequate volume of blood received in culture bottles Performed at [facility] [address], [place], [place] [address]    Culture   Final    NO GROWTH 4 DAYS Performed at [facility], [place] [address] [place], [place] [address]    Report Status PENDING  Incomplete    Studies/Results: Reviewed/incorporated into medical decision  making  No results found.  5/27 tte  1. Strain abnormal -11.5 but poor tracking not reported EF is  hyperdynamic.   2. Left ventricular ejection fraction, by estimation, is 60 to 65%. The  left ventricle has normal function. The left ventricle has no regional  wall motion abnormalities. Left ventricular diastolic parameters were  normal.   3. Right ventricular systolic function is normal. The right ventricular  size is normal.   4. The pericardial effusion is posterior to the left ventricle and  anterior to the right ventricle.   5. The mitral valve is normal in structure. No evidence of mitral valve  regurgitation. No evidence of mitral stenosis.   6. Nodular calcification of right coronary cusp likely sclerosis If  suspicion of SBE high consider TEE. The aortic valve is normal in  structure. There is moderate  calcification of the aortic valve. There is  moderate thickening of the aortic valve.  Aortic valve regurgitation is not visualized. Aortic valve  sclerosis/calcification is present, without any evidence of aortic  stenosis.   7. The inferior vena cava is normal in size with greater than 50%  respiratory variability, suggesting right atrial pressure of 3 mmHg.     Assessment/Plan:  INTERVAL HISTORY: Blood cultures clearing  Principal Problem:   Bacteremia due to Enterococcus Active Problems:   Chronic lymphocytic leukemia (HCC)   Colon cancer (HCC)   Hypertension   Hypercholesterolemia   Diabetes mellitus (HCC)   Sepsis ([place])   Neutropenia in patient with hx of CLL   Protein-calorie malnutrition, severe (HCC)   Dehydration   Hyponatremia   Stage 3b chronic kidney disease (CKD) ([place])   Neutropenic fever (HCC)   Refeeding syndrome   Hypophosphatemia   CLL (chronic lymphocytic leukemia) ([place])   Failure to thrive in adult    Letrell Attwood is a 81 y.o. male with  CLL with recurrence in context of coming off and on Imbruvaca with neutropenia admitted 5/25 with sepsis in setting of AMP S E faecium bacteremia  5/26 bcx high burden bacteremia e faecium 5/27 tte showing av sclerosis/calcification but no valve destruction (some effusion as well). Doesn't act like tamponade  [place]ova score high in setting malignancy, duration of sickness (I wouldn't be able to tell how long he has been sick for), agree need to r/o endocarditis  ---------- 5/31 assessment Worsening new AKI Clinically feels same dysphagia and tired/malaise I discussed with patient extensively today 1) e faecium is much less likely than e faecalis in terms of causing IE 2) bacteremia had cleared quickly with just ampicillin 3) tte sclerosis and appeared age related without concerning sign of IE although can't r/o. I spoke with cardiology as well 4) I offered him 3 approaches A) treat with amp or appropriate oral  abx (sensitive to linezolid mic 2) for 2 weeks and repeat tte and surveillant blood cx B) perform tee which in his setting with pancytopenia higher risk for esophageal rupture/bleeding. And him having dysphagia with sedation could have aspiration as well C) commit to treating as IE without TEE, which is too high of risk (aki will be worsened potentially irreversible aki with aminoglycoside combination)   He opted for approach A   -f/u 5/27 repeat bcx -continue ampicillin; will monitor for dose adjustment -once ready to take PO better or near disposition can transition to linezolid (pancytopenia without reversibility, sowould like to limit its use at this time) -repeat tte in around 2 weeks -repeat blood cx in around 3 weeks (  1 week off abx) -will defer tee for now -discussed with primary team/cardiology  I spent more than 50 minute reviewing data/chart, and coordinating care and >50% direct face to face time providing counseling/discussing diagnostics/treatment plan with patient     LOS: 5 days   Mickey Esguerra T Edwin Cherian 02/25/2022, 1:50 PM

## 2022-02-25 NOTE — Progress Notes (Signed)
Inpatient Diabetes Program Recommendations  AACE/ADA: New Consensus Statement on Inpatient Glycemic Control (2015)  Target Ranges:  Prepandial:   less than 140 mg/dL      Peak postprandial:   less than 180 mg/dL (1-2 hours)      Critically ill patients:  140 - 180 mg/dL   Lab Results  Component Value Date   GLUCAP 277 (H) 02/25/2022   HGBA1C 6.9 (H) 02/09/2022    Review of Glycemic Control  Latest Reference Range & Units 02/24/22 16:18 02/24/22 20:29 02/25/22 00:20 02/25/22 04:47 02/25/22 07:29  Glucose-Capillary 70 - 99 mg/dL 278 (H) 191 (H) 195 (H) 219 (H) 277 (H)  (H): Data is abnormally high  Current orders for Inpatient glycemic control: Semglee 12 units QD, Novolog 0-9 units Q4H, Novolog 4 units Q4H Tube feed coverage     Please consider:   1-Novolog 6 units Q4H tube feed coverage (hold if feeds discontinued  or held)  2-Semglee 15 units QAM  Will continue to follow while inpatient.  Thank you, Reche Dixon, MSN, [facility] Coordinator Inpatient Diabetes Program [phone] (team pager from 8a-5p)

## 2022-02-25 NOTE — TOC Progression Note (Signed)
Transition of Care [facility]) - Progression Note    Patient Details  Name: [name] MRN: [id number] Date of Birth: 01-Jul-1941  [facility] CM/SW Contact  Leeroy Cha, RN Phone Number: 02/25/2022, 9:07 AM  Clinical Narrative:    Following for hhc and toc needs   Expected Discharge Plan: Home/Self Care Barriers to Discharge: Continued Medical Work up  Expected Discharge Plan and Services Expected Discharge Plan: Home/Self Care   Discharge Planning Services: CM Consult   Living arrangements for the past 2 months: Single Family Home                                       Social Determinants of Health (SDOH) Interventions    Readmission Risk Interventions     View : No data to display.

## 2022-02-25 NOTE — Progress Notes (Signed)
   Chart reviewed. Discussed TEE, cortrak and PEG tube placement with SLP therapist. Attempted to contact patient's wife Deneise Lever to establish meeting date/time for [place] discussion. Unable to reach. Voicemail left with contact information given.   I am on service tomorrow, 6/1, and will re-attempt to contact family then.   Detailed note and recommendations to follow once GOC has been completed.   Thank you for your referral and allowing PMT to assist in Mr. [name]'s care.   Jordan Hawks, FNP-BC Palliative Medicine Team  Phone: [phone]  NO CHARGE

## 2022-02-25 NOTE — Progress Notes (Signed)
Occupational Therapy Treatment Patient Details Name: [name] MRN: [id number] DOB: 05/25/1941 Today's Date: 02/25/2022   History of present illness 81 year old male with history of CLL initially diagnosed in 2014, status postchemotherapy and currently on Imbruvica, DM 2, HTN, colorectal cancer 2009, who came into the hospital with progressive and generalized weakness and admitted 02/19/22 with Bacteremia due to Enterococcus   OT comments  Treatment focused on standing oral care task that was limited by unexpected BM - resulting in him finishing the task seated. Patient overall min assist as he is generally weak and mildly unsteady. Therapist reiterated speech therapist recommendations during oral care and when patient sounded gurgly. Cont POC.    Recommendations for follow up therapy are one component of a multi-disciplinary discharge planning process, led by the attending physician.  Recommendations may be updated based on patient status, additional functional criteria and insurance authorization.    Follow Up Recommendations  Home health OT    Assistance Recommended at Discharge Frequent or constant Supervision/Assistance  Patient can return home with the following  A little help with walking and/or transfers;A little help with bathing/dressing/bathroom;Assist for transportation;Assistance with cooking/housework;Help with stairs or ramp for entrance;Direct supervision/assist for medications management   Equipment Recommendations  BSC/3in1    Recommendations for Other Services      Precautions / Restrictions Precautions Precautions: Fall Precaution Comments: feeding tube Restrictions Weight Bearing Restrictions: No       Mobility Bed Mobility                    Transfers                         Balance Overall balance assessment: History of Falls, Needs assistance Sitting-balance support: No upper extremity supported, Feet supported Sitting  balance-Leahy Scale: Fair     Standing balance support: Single extremity supported, During functional activity Standing balance-Leahy Scale: Poor Standing balance comment: reliant on UE support                           ADL either performed or assessed with clinical judgement   ADL Overall ADL's : Needs assistance/impaired     Grooming: Standing;Oral care Grooming Details (indicate cue type and reason): Initially started oral care in standing with min assist for steadying and verbal cues to place hand/elbow on sink to steady himself. Patient had uncontrolled BM and needed to sit on BSc - so completed task in seated position.                 Toilet Transfer: Minimal assistance;BSC/3in1     Toileting - Clothing Manipulation Details (indicate cue type and reason): needed assist for perianal care - to limit discomfort for excoriated skin.     Functional mobility during ADLs: Minimal assistance;Rolling walker (2 wheels) General ADL Comments: Supervision to trasnfer to edge of bed - therapist assisted with managing lines. Patient min assist to stand and take steps to the sink. Min assist for steadying at sink until he braced arm on sink. Assistance needed for pericare. Patient positioned in recliner and encouarged to sit up for a while. Therapist reiterated speech therapist recommendations during oral care and coughing.    Extremity/Trunk Assessment              Vision Patient Visual Report: No change from baseline Vision Assessment?: No apparent visual deficits   Perception     Praxis  Cognition Arousal/Alertness: Awake/alert Behavior During Therapy: WFL for tasks assessed/performed Overall Cognitive Status: Within Functional Limits for tasks assessed                                          Exercises      Shoulder Instructions       General Comments      Pertinent Vitals/ Pain       Pain Assessment Pain Assessment: No/denies  pain  Home Living                                          Prior Functioning/Environment              Frequency  Min 2X/week        Progress Toward Goals  OT Goals(current goals can now be found in the care plan section)  Progress towards OT goals: Progressing toward goals  Acute Rehab OT Goals Patient Stated Goal: get stronger OT Goal Formulation: With patient Time For Goal Achievement: 03/10/22 Potential to Achieve Goals: Good  Plan Discharge plan remains appropriate    Co-evaluation                 AM-PAC OT "6 Clicks" Daily Activity     Outcome Measure   Help from another person eating meals?: A Little Help from another person taking care of personal grooming?: A Little Help from another person toileting, which includes using toliet, bedpan, or urinal?: A Little Help from another person bathing (including washing, rinsing, drying)?: A Little Help from another person to put on and taking off regular upper body clothing?: A Little Help from another person to put on and taking off regular lower body clothing?: A Little 6 Click Score: 18    End of Session Equipment Utilized During Treatment: Gait belt;Rolling walker (2 wheels)  OT Visit Diagnosis: Unsteadiness on feet (R26.81);History of falling (Z91.81);Muscle weakness (generalized) (M62.81)   Activity Tolerance Patient tolerated treatment well   Patient Left in bed;with call bell/phone within reach;with bed alarm set   Nurse Communication Mobility status        Time: 0076-2263 OT Time Calculation (min): 19 min  Charges: OT General Charges $OT Visit: 1 Visit OT Treatments $Self Care/Home Management : 8-22 mins  Derl Barrow, OTR/L [place]  Office [phone] Pager: [place] 02/25/2022, 1:13 PM

## 2022-02-25 NOTE — Progress Notes (Signed)
Nutrition Follow-up  DOCUMENTATION CODES:   Severe malnutrition in context of chronic illness, Underweight  INTERVENTION:  - continue Osmolite 1.5 @ 55 ml/hr with 45 ml Prosource TF BID.  - will increase free water flush to 150 ml every 4 hours d/t stool output and addition of fiber--will provide a daily total of 1906 ml.  - will order 220 mg zinc sulfate/day x14 days via tube d/t zinc deficiency and altered taste (a symptom of zinc deficiency).  - will add 1 packet Nutrisource fiber TID via tube to provide 9 grams soluble fiber/day d/t type 7 BMs.    NUTRITION DIAGNOSIS:   Severe Malnutrition related to chronic illness, cancer and cancer related treatments as evidenced by moderate fat depletion, severe muscle depletion, percent weight loss. -ongoing  GOAL:   Patient will meet greater than or equal to 90% of their needs -met with TF regimen  MONITOR:   TF tolerance, Labs, Weight trends  ASSESSMENT:   81 year old male with medical history of CLL initially diagnosed in 2014, s/p chemo and currently on Imbruvica, type 2 DM, HTN, colorectal cancer in 2009, and hypercholesterolemia. He presented to the ED due to progressive generalized weakness. He has been experiencing weight loss and poor appetite for several months. He was seen by GI outpatient in 10/2021 and workup showed no significant findings. He was taken off Imbruvica in 12/2021 as it was thought to be the cause but he continued to decline. He was admitted 2 weeks ago and returned to the ED on 5/25. He was restarted on Imbruvica after recent discharge. Repeat CT scan of the abdomen and pelvis on 5/25 shows recurrence of his CLL. Oncology consulted. He was admitted for dehydration.  Patient laying in bed with no visitors present at the time of RD visit. Small bore NGT placed in L nare on 5/26 (gastric per abdominal x-ray on 5/26).   Patient denies abdominal pain, pressure, cramping, or nausea. Noted 4 type 7 BMs today.   Patient  is receiving goal TF regimen: Osmolite 1.5 @ 55 ml/hr with 45 ml Prosource TF BID and 100 ml free water every 4 hours. This regimen is providing 2060 kcal, 105 grams protein, 17 mg zinc (+ 11 mg from multivitamin), 0 grams fiber, and 1606 ml free water.  Weight stable 5/26-5/28 and he has not been weighed since 5/28. Non-pitting edema to BUE and mild pitting edema to BLE documented in the edema section of flow sheet.   SLP last saw patient on 5/30 and recommendation at that time was to continue NPO status with ice chips.   Labs reviewed; CBGs: 195, 219, 277 mg/dl, BUN: 67 mg/dl, creatinine: 2.61 mg/dl, Ca: 6.7 mg/dl, GFR: 24 ml/min, zinc on 5/27: 27 (reference range: 44-115), vitamin D WDL on 5/27.  Medications reviewed; sliding scale novolog, 6 units novolog every 4 hours, 15 units semglee/day, 1 tablet multivitamin per tube/day, 40 mg protonix per tube BID, 100 mg thiamine per tube/day.    Diet Order:   Diet Order     None       EDUCATION NEEDS:   Education needs have been addressed  Skin:  Skin Assessment: Reviewed RN Assessment  Last BM:  5/31 (Type 7 x4, all large amounts)  Height:   Ht Readings from Last 1 Encounters:  02/20/22 5' 7"  (1.702 m)    Weight:   Wt Readings from Last 1 Encounters:  02/22/22 55.8 kg     BMI:  Body mass index is 19.27 kg/m.  Estimated  Nutritional Needs:  Kcal:  2000-2200 kcal Protein:  100-115 grams Fluid:  >/= 2.2 L/day     Edward Matin, MS, RD, LDN Registered Dietitian II Inpatient Clinical Nutrition RD pager # and on-call/weekend pager # available in [facility]

## 2022-02-25 NOTE — Progress Notes (Signed)
PROGRESS NOTE    [name]  LFY:[id number] DOB: 12/25/1940 DOA: 02/19/2022 PCP: [name], MD   Brief Narrative:  81 year old male with history of CLL initially diagnosed in 2014, status postchemotherapy and currently on Imbruvica, DM 2, HTN, colorectal cancer 2009, who came into the hospital with progressive and generalized weakness.  Patient has been experiencing some weight loss and poor appetite for the past several months, was seen by GI in February as an outpatient and underwent work-up without any significant major findings.  He was taken off Imbruvica in April as it was believed that it may be related to that, however he continued to decline.  He was hospitalized just 2 weeks ago for dehydration, and when he left the hospital he was placed back on his Imbruvica.  While at home he continued to decline and was rehospitalized on 5/25.  Repeat CT scan of the abdomen and pelvis showed recurrence of the CLL, he was also found to be neutropenic, have a fever and have Enterococcus bacteremia.  Oncology and ID consulted  Assessment & Plan:   Sepsis: Present on admission Enterococcus faecium bacteremia Neutropenic fever -2D echo showed normal EF of 60 to 65%, small pericardial effusion,  there was also a nodular calcification in the right coronary cusp likely sclerosis but if high suspicion for SBE, consider TEE.  ID following and recommending TEE.  TEE possibly scheduled for today. -currently on ampicillin.  No temperature spikes over the last 24 hours.  Blood pressure stable. -Repeat blood cultures from 02/21/2022 are negative so far  Goals of care Failure to thrive Dehydration/dysphagia/chronic cough CLL Physical deconditioning -overall difficult situation and complex case given progression of his CLL with progressive lymphadenopathy and now complicated by persistent neutropenia and Enterococcus bacteremia with concern for infectious endocarditis.   -Palliative care  following -Oncology following -Currently has NG feeding.  SLP still recommends n.p.o. except ice chips  Acute kidney injury on CKD stage IIIb -Baseline creatinine 1.4-1.6.  Worsening to 2.61 today.  Continue IV fluids.  We will check renal ultrasound.  Repeat a.m. labs.  Pancytopenia -Due to malignancy.  Receiving Granix as per oncology recommendations for neutropenic fever.  Monitor  Hyperlipidemia -Continue statin  Essential hypertension  -continue amlodipine, metoprolol  Hyperlipidemia -Continue statin  Diabetes mellitus type 2 with hyperglycemia -Continue long-acting insulin.  Continue NovoLog and SSI  Severe protein calorie malnutrition -Dietitian following for tube feedings   DVT prophylaxis: Heparin subcutaneous Code Status: Full Family Communication: Wife at bedside Disposition Plan: Status is: Inpatient Remains inpatient appropriate because: Of severity of illness  Consultants: ID/oncology/palliative care  Procedures: 2D echo  Antimicrobials:  Anti-infectives (From admission, onward)    Start     Dose/Rate Route Frequency Ordered Stop   02/23/22 1900  ampicillin (OMNIPEN) 2 g in sodium chloride 0.9 % 100 mL IVPB        2 g 300 mL/hr over 20 Minutes Intravenous Every 8 hours 02/23/22 1059     02/21/22 1000  ampicillin (OMNIPEN) 2 g in sodium chloride 0.9 % 100 mL IVPB  Status:  Discontinued        2 g 300 mL/hr over 20 Minutes Intravenous Every 6 hours 02/21/22 0940 02/23/22 1059   02/21/22 0300  ampicillin (OMNIPEN) 2 g in sodium chloride 0.9 % 100 mL IVPB  Status:  Discontinued        2 g 300 mL/hr over 20 Minutes Intravenous Every 8 hours 02/21/22 0215 02/21/22 0940   02/20/22 0815  vancomycin (VANCOREADY)  IVPB 1250 mg/250 mL  Status:  Discontinued        1,250 mg 166.7 mL/hr over 90 Minutes Intravenous Every 48 hours 02/20/22 0715 02/21/22 0215   02/20/22 0800  ceFEPIme (MAXIPIME) 2 g in sodium chloride 0.9 % 100 mL IVPB  Status:  Discontinued         2 g 200 mL/hr over 30 Minutes Intravenous Every 24 hours 02/20/22 0643 02/21/22 0215   02/20/22 0800  vancomycin (VANCOREADY) IVPB 1750 mg/350 mL  Status:  Discontinued        1,750 mg 175 mL/hr over 120 Minutes Intravenous Every 48 hours 02/20/22 0703 02/20/22 0715        Subjective: Patient seen and examined at bedside.  Poor historian.  No agitation, seizures, fever or vomiting reported. Objective: Vitals:   02/24/22 0906 02/24/22 1318 02/24/22 1833 02/25/22 0604  BP: (!) 133/50 (!) 129/54 (!) 130/54 (!) 143/55  Pulse: (!) 110 99 (!) 105 (!) 110  Resp: '18 16 16 20  '$ Temp: 98.9 F (37.2 C) 98 F (36.7 C) 98.6 F (37 C) 98.3 F (36.8 C)  TempSrc: Oral Oral Oral Oral  SpO2: 98% 100% 99% 100%  Weight:      Height:        Intake/Output Summary (Last 24 hours) at 02/25/2022 0743 Last data filed at 02/25/2022 0600 Gross per 24 hour  Intake 4826.22 ml  Output 950 ml  Net 3876.22 ml    Filed Weights   02/21/22 0420 02/21/22 0500 02/22/22 0424  Weight: 54.3 kg 55.2 kg 55.8 kg    Examination:  General: On room air.  No distress.  Chronically ill and deconditioned looking. ENT/neck: No thyromegaly.  JVD is not elevated.  NG tube present respiratory: Decreased breath sounds at bases bilaterally with some crackles; no wheezing  CVS: S1-S2 heard, tachycardic intermittently  abdominal: Soft, nontender, slightly distended; no organomegaly, normal bowel sounds are heard Extremities: Trace lower extremity edema; no cyanosis  CNS: Awake; extremely slow to respond.  Poor historian.  No focal neurologic deficit.  Moves extremities Lymph: No obvious lymphadenopathy Skin: No obvious ecchymosis/lesions  psych: Flat affect.  Currently not agitated.   Musculoskeletal: No obvious joint swelling/deformity    Data Reviewed: I have personally reviewed following labs and imaging studies  CBC: Recent Labs  Lab 02/21/22 0632 02/22/22 0637 02/23/22 0542 02/24/22 0329 02/25/22 0424   WBC 0.9* 1.0* 1.6* 2.4* 1.6*  NEUTROABS 0.3* 0.3* 0.5* 0.8* PENDING  HGB 8.8* 8.6* 8.4* 8.8* 8.7*  HCT 26.8* 27.1* 26.7* 29.0* 29.0*  MCV 83.0 84.4 85.9 88.7 88.4  PLT 151 125* 127* 120* 87*    Basic Metabolic Panel: Recent Labs  Lab 02/21/22 0632 02/21/22 1657 02/22/22 0637 02/22/22 1645 02/23/22 0542 02/23/22 1722 02/24/22 0329 02/24/22 1723 02/25/22 0424  NA 131*  --  129*  --  134*  --  138  --  139  K 3.4*  --  3.8  --  3.8  --  4.0  --  4.0  CL 100  --  100  --  104  --  108  --  110  CO2 23  --  22  --  23  --  21*  --  19*  GLUCOSE 247*  --  323*  --  353*  --  304*  --  221*  BUN 36*  --  36*  --  48*  --  61*  --  67*  CREATININE 1.54*  --  1.71*  --  2.06*  --  2.56*  --  2.61*  CALCIUM 7.7*  --  7.3*  --  7.2*  --  7.1*  --  6.7*  MG 1.6*   < > 1.8 1.8 1.9 1.9 2.0 1.9 1.9  PHOS 2.7   < > 1.9* 3.6 3.4 2.7 2.4* 1.6*  --    < > = values in this interval not displayed.    GFR: Estimated Creatinine Clearance: 18.1 mL/min (A) (by C-G formula based on SCr of 2.61 mg/dL (H)). Liver Function Tests: Recent Labs  Lab 02/20/22 0310 02/21/22 5093 02/22/22 0637 02/23/22 0542 02/25/22 0424  AST 18 31 32 29 30  ALT '15 26 29 31 27  '$ ALKPHOS 79 78 82 79 106  BILITOT 1.1 0.8 0.6 0.6 0.5  PROT 4.9* 4.7* 4.4* 4.2* 4.0*  ALBUMIN 2.4* 2.2* 1.9* 2.0* 1.7*    No results for input(s): LIPASE, AMYLASE in the last 168 hours. No results for input(s): AMMONIA in the last 168 hours. Coagulation Profile: No results for input(s): INR, PROTIME in the last 168 hours. Cardiac Enzymes: No results for input(s): CKTOTAL, CKMB, CKMBINDEX, TROPONINI in the last 168 hours. BNP (last 3 results) No results for input(s): PROBNP in the last 8760 hours. HbA1C: No results for input(s): HGBA1C in the last 72 hours. CBG: Recent Labs  Lab 02/24/22 1618 02/24/22 2029 02/25/22 0020 02/25/22 0447 02/25/22 0729  GLUCAP 278* 191* 195* 219* 277*    Lipid Profile: No results for input(s):  CHOL, HDL, LDLCALC, TRIG, CHOLHDL, LDLDIRECT in the last 72 hours. Thyroid Function Tests: No results for input(s): TSH, T4TOTAL, FREET4, T3FREE, THYROIDAB in the last 72 hours. Anemia Panel: No results for input(s): VITAMINB12, FOLATE, FERRITIN, TIBC, IRON, RETICCTPCT in the last 72 hours. Sepsis Labs: Recent Labs  Lab 02/19/22 0235 02/19/22 0927  LATICACIDVEN 3.4* 1.8     Recent Results (from the past 240 hour(s))  Culture, blood (Routine X 2) w Reflex to ID Panel     Status: Abnormal   Collection Time: 02/20/22  7:50 AM   Specimen: BLOOD  Result Value Ref Range Status   Specimen Description   Final    BLOOD LEFT ANTECUBITAL Performed at [facility], [place] [address], [place], [place] [address]    Special Requests   Final    BOTTLES DRAWN AEROBIC AND ANAEROBIC Blood Culture adequate volume Performed at [facility] [address] [place], [place] [address]    Culture  Setup Time   Final    GRAM POSITIVE COCCI IN BOTH AEROBIC AND ANAEROBIC BOTTLES CRITICAL RESULT CALLED TO, READ BACK BY AND VERIFIED WITH: E JACKSON,PHARMD'@0200'$  02/21/22 [place] Performed at [facility], [place] [address], [place], [place] [address]    Culture ENTEROCOCCUS FAECIUM (A)  Final   Report Status 02/22/2022 FINAL  Final   Organism ID, Bacteria ENTEROCOCCUS FAECIUM  Final      Susceptibility   Enterococcus faecium - MIC*    AMPICILLIN <=2 SENSITIVE Sensitive     VANCOMYCIN <=0.5 SENSITIVE Sensitive     GENTAMICIN SYNERGY SENSITIVE Sensitive     * ENTEROCOCCUS FAECIUM  Blood Culture ID Panel (Reflexed)     Status: Abnormal   Collection Time: 02/20/22  7:50 AM  Result Value Ref Range Status   Enterococcus faecalis NOT DETECTED NOT DETECTED Final   Enterococcus Faecium DETECTED (A) NOT DETECTED Final    Comment: CRITICAL RESULT CALLED TO, READ BACK BY AND VERIFIED WITH: E JACKSON,PHARMD'@0200'$  02/21/22 [place]  Listeria monocytogenes NOT DETECTED NOT DETECTED  Final   Staphylococcus species NOT DETECTED NOT DETECTED Final   Staphylococcus aureus (BCID) NOT DETECTED NOT DETECTED Final   Staphylococcus epidermidis NOT DETECTED NOT DETECTED Final   Staphylococcus lugdunensis NOT DETECTED NOT DETECTED Final   Streptococcus species NOT DETECTED NOT DETECTED Final   Streptococcus agalactiae NOT DETECTED NOT DETECTED Final   Streptococcus pneumoniae NOT DETECTED NOT DETECTED Final   Streptococcus pyogenes NOT DETECTED NOT DETECTED Final   A.calcoaceticus-baumannii NOT DETECTED NOT DETECTED Final   Bacteroides fragilis NOT DETECTED NOT DETECTED Final   Enterobacterales NOT DETECTED NOT DETECTED Final   Enterobacter cloacae complex NOT DETECTED NOT DETECTED Final   Escherichia coli NOT DETECTED NOT DETECTED Final   Klebsiella aerogenes NOT DETECTED NOT DETECTED Final   Klebsiella oxytoca NOT DETECTED NOT DETECTED Final   Klebsiella pneumoniae NOT DETECTED NOT DETECTED Final   Proteus species NOT DETECTED NOT DETECTED Final   Salmonella species NOT DETECTED NOT DETECTED Final   Serratia marcescens NOT DETECTED NOT DETECTED Final   Haemophilus influenzae NOT DETECTED NOT DETECTED Final   Neisseria meningitidis NOT DETECTED NOT DETECTED Final   Pseudomonas aeruginosa NOT DETECTED NOT DETECTED Final   Stenotrophomonas maltophilia NOT DETECTED NOT DETECTED Final   Candida albicans NOT DETECTED NOT DETECTED Final   Candida auris NOT DETECTED NOT DETECTED Final   Candida glabrata NOT DETECTED NOT DETECTED Final   Candida krusei NOT DETECTED NOT DETECTED Final   Candida parapsilosis NOT DETECTED NOT DETECTED Final   Candida tropicalis NOT DETECTED NOT DETECTED Final   Cryptococcus neoformans/gattii NOT DETECTED NOT DETECTED Final   Vancomycin resistance NOT DETECTED NOT DETECTED Final    Comment: Performed at [facility], [address] [place], [place] [address]  Culture, blood (Routine X 2) w Reflex to ID Panel     Status: Abnormal   Collection  Time: 02/20/22  7:54 AM   Specimen: BLOOD  Result Value Ref Range Status   Specimen Description   Final    BLOOD BLOOD RIGHT FOREARM Performed at [facility] [address], [place], [place] [address]    Special Requests   Final    BOTTLES DRAWN AEROBIC AND ANAEROBIC Blood Culture adequate volume Performed at [facility] [address], [place], [place] [address]    Culture  Setup Time   Final    GRAM POSITIVE COCCI IN BOTH AEROBIC AND ANAEROBIC BOTTLES CRITICAL VALUE NOTED.  VALUE IS CONSISTENT WITH PREVIOUSLY REPORTED AND CALLED VALUE.    Culture (A)  Final    ENTEROCOCCUS FAECIUM SUSCEPTIBILITIES PERFORMED ON PREVIOUS CULTURE WITHIN THE LAST 5 DAYS. Performed at [facility], [place] [address], [place], [place] [address]    Report Status 02/23/2022 FINAL  Final  Culture, blood (Routine X 2) w Reflex to ID Panel     Status: None (Preliminary result)   Collection Time: 02/21/22  6:32 AM   Specimen: BLOOD  Result Value Ref Range Status   Specimen Description   Final    BLOOD BLOOD RIGHT FOREARM Performed at [facility] [address], [place], [place] [address]    Special Requests   Final    BOTTLES DRAWN AEROBIC ONLY Blood Culture adequate volume Performed at [facility] [address], [place], [place] [address]    Culture   Final    NO GROWTH 3 DAYS Performed at [facility], [place],  [place] [address]    Report Status PENDING  Incomplete  Culture, blood (Routine X 2) w Reflex to ID Panel     Status: None (Preliminary result)   Collection Time: 02/21/22  6:32 AM   Specimen: BLOOD  Result Value Ref Range Status   Specimen Description   Final    BLOOD BLOOD RIGHT HAND Performed at [facility] [address] [place], [place] [address]    Special Requests   Final    IN PEDIATRIC BOTTLE Blood Culture results may not be optimal due to an  inadequate volume of blood received in culture bottles Performed at [facility] [address] [place], [place] [address]    Culture   Final    NO GROWTH 3 DAYS Performed at [facility], [place] [address], [place], [place] [address]    Report Status PENDING  Incomplete          Radiology Studies: DG CHEST PORT 1 VIEW  Result Date: 02/23/2022 CLINICAL DATA:  Weakness. Colon cancer and chronic lymphocytic leukemia. EXAM: PORTABLE CHEST 1 VIEW COMPARISON:  02/19/2022 FINDINGS: The heart size and mediastinal contours are within normal limits. Aortic atherosclerotic calcification incidentally noted. A Dobbhoff feeding tube is seen in place with the tip in the proximal stomach. Low lung volumes are seen with bibasilar atelectasis, new since prior exam. IMPRESSION: Low lung volumes with bibasilar atelectasis. Dobbhoff feeding tube tip in proximal stomach. Electronically Signed   By: Marlaine Hind M.D.   On: 02/23/2022 10:30        Scheduled Meds:  amLODipine  2.5 mg Per NG tube Daily   aspirin  81 mg Per Tube Daily   atorvastatin  40 mg Per Tube Daily   chlorhexidine  15 mL Mouth Rinse BID   feeding supplement (PROSource TF)  45 mL Per Tube BID   free water  100 mL Per Tube Q4H   guaiFENesin-dextromethorphan  5 mL Per Tube Q8H   heparin  5,000 Units Subcutaneous Q8H   insulin aspart  0-9 Units Subcutaneous Q4H   insulin aspart  4 Units Subcutaneous Q4H   insulin glargine-yfgn  12 Units Subcutaneous Daily   metoprolol tartrate  25 mg Per Tube BID   mirtazapine  7.5 mg Per Tube QHS   multivitamin with minerals  1 tablet Per Tube Daily   ondansetron  4 mg Per Tube Q8H   Or   ondansetron (ZOFRAN) IV  4 mg Intravenous Q8H   pantoprazole sodium  40 mg Per Tube BID   thiamine  100 mg Per Tube Daily   Continuous Infusions:  sodium chloride Stopped (02/20/22 0850)   sodium chloride 100 mL/hr at 02/25/22 0633   ampicillin (OMNIPEN) IV 2 g (02/25/22 0239)    feeding supplement (OSMOLITE 1.5 CAL) 1,000 mL (02/25/22 0309)          Aline August, MD [facility] 02/25/2022, 7:43 AM

## 2022-02-26 ENCOUNTER — Other Ambulatory Visit (HOSPITAL_COMMUNITY): Payer: Self-pay

## 2022-02-26 ENCOUNTER — Encounter (HOSPITAL_COMMUNITY)
Admission: EM | Disposition: A | Discharge status: Home Health | Payer: Self-pay | Source: Home / Self Care | Attending: Internal Medicine

## 2022-02-26 ENCOUNTER — Inpatient Hospital Stay (HOSPITAL_COMMUNITY): Payer: Medicare Other

## 2022-02-26 DIAGNOSIS — N179 Acute kidney failure, unspecified: Secondary | ICD-10-CM | POA: Diagnosis not present

## 2022-02-26 DIAGNOSIS — C9110 Chronic lymphocytic leukemia of B-cell type not having achieved remission: Secondary | ICD-10-CM | POA: Diagnosis not present

## 2022-02-26 DIAGNOSIS — D701 Agranulocytosis secondary to cancer chemotherapy: Secondary | ICD-10-CM | POA: Diagnosis not present

## 2022-02-26 DIAGNOSIS — R7881 Bacteremia: Secondary | ICD-10-CM | POA: Diagnosis not present

## 2022-02-26 DIAGNOSIS — R627 Adult failure to thrive: Secondary | ICD-10-CM | POA: Diagnosis not present

## 2022-02-26 LAB — CULTURE, BLOOD (ROUTINE X 2)
Culture: NO GROWTH
Culture: NO GROWTH
Special Requests: ADEQUATE

## 2022-02-26 LAB — CBC WITH DIFFERENTIAL/PLATELET
Abs Immature Granulocytes: 0.08 10*3/uL — ABNORMAL HIGH (ref 0.00–0.07)
Basophils Absolute: 0 10*3/uL (ref 0.0–0.1)
Basophils Relative: 0 %
Eosinophils Absolute: 0 10*3/uL (ref 0.0–0.5)
Eosinophils Relative: 1 %
HCT: 25 % — ABNORMAL LOW (ref 39.0–52.0)
Hemoglobin: 7.7 g/dL — ABNORMAL LOW (ref 13.0–17.0)
Immature Granulocytes: 3 %
Lymphocytes Relative: 32 %
Lymphs Abs: 0.7 10*3/uL (ref 0.7–4.0)
MCH: 26.6 pg (ref 26.0–34.0)
MCHC: 30.8 g/dL (ref 30.0–36.0)
MCV: 86.5 fL (ref 80.0–100.0)
Monocytes Absolute: 0.1 10*3/uL (ref 0.1–1.0)
Monocytes Relative: 4 %
Neutro Abs: 1.4 10*3/uL — ABNORMAL LOW (ref 1.7–7.7)
Neutrophils Relative %: 60 %
Platelets: 91 10*3/uL — ABNORMAL LOW (ref 150–400)
RBC: 2.89 MIL/uL — ABNORMAL LOW (ref 4.22–5.81)
RDW: 18.4 % — ABNORMAL HIGH (ref 11.5–15.5)
WBC: 2.3 10*3/uL — ABNORMAL LOW (ref 4.0–10.5)
nRBC: 0 % (ref 0.0–0.2)

## 2022-02-26 LAB — MAGNESIUM: Magnesium: 1.9 mg/dL (ref 1.7–2.4)

## 2022-02-26 LAB — BASIC METABOLIC PANEL
Anion gap: 6 (ref 5–15)
BUN: 83 mg/dL — ABNORMAL HIGH (ref 8–23)
CO2: 21 mmol/L — ABNORMAL LOW (ref 22–32)
Calcium: 6.7 mg/dL — ABNORMAL LOW (ref 8.9–10.3)
Chloride: 112 mmol/L — ABNORMAL HIGH (ref 98–111)
Creatinine, Ser: 3.11 mg/dL — ABNORMAL HIGH (ref 0.61–1.24)
GFR, Estimated: 20 mL/min — ABNORMAL LOW (ref >60)
Glucose, Bld: 239 mg/dL — ABNORMAL HIGH (ref 70–99)
Potassium: 4.1 mmol/L (ref 3.5–5.1)
Sodium: 139 mmol/L (ref 135–145)

## 2022-02-26 LAB — GLUCOSE, CAPILLARY
Glucose-Capillary: 195 mg/dL — ABNORMAL HIGH (ref 70–99)
Glucose-Capillary: 197 mg/dL — ABNORMAL HIGH (ref 70–99)
Glucose-Capillary: 198 mg/dL — ABNORMAL HIGH (ref 70–99)
Glucose-Capillary: 200 mg/dL — ABNORMAL HIGH (ref 70–99)
Glucose-Capillary: 214 mg/dL — ABNORMAL HIGH (ref 70–99)
Glucose-Capillary: 221 mg/dL — ABNORMAL HIGH (ref 70–99)
Glucose-Capillary: 253 mg/dL — ABNORMAL HIGH (ref 70–99)

## 2022-02-26 SURGERY — ECHOCARDIOGRAM, TRANSESOPHAGEAL
Anesthesia: Monitor Anesthesia Care

## 2022-02-26 MED ORDER — SODIUM CHLORIDE 0.9 % IV SOLN: INTRAVENOUS | Status: DC

## 2022-02-26 NOTE — Progress Notes (Signed)
PROGRESS NOTE    [name]  PYP:[id number] DOB: [birth year] DOA: 02/19/2022 PCP: [name], MD   Brief Narrative:  81 year old male with history of CLL initially diagnosed in 2014, status postchemotherapy and currently on Imbruvica, DM 2, HTN, colorectal cancer 2009, who came into the hospital with progressive and generalized weakness.  Patient has been experiencing some weight loss and poor appetite for the past several months, was seen by GI in February as an outpatient and underwent work-up without any significant major findings.  He was taken off Imbruvica in April as it was believed that it may be related to that, however he continued to decline.  He was hospitalized just 2 weeks ago for dehydration, and when he left the hospital he was placed back on his Imbruvica.  While at home he continued to decline and was rehospitalized on 5/25.  Repeat CT scan of the abdomen and pelvis showed recurrence of the CLL, he was also found to be neutropenic, have a fever and have Enterococcus bacteremia.  Oncology and ID consulted  Assessment & Plan:   Sepsis: Present on admission Enterococcus faecium bacteremia Neutropenic fever -2D echo showed normal EF of 60 to 65%, small pericardial effusion,  there was also a nodular calcification in the right coronary cusp likely sclerosis but if high suspicion for SBE, consider TEE.  ID following and recommending TEE.  Cardiology was the opinion that patient is high risk for TEE.  Patient has opted to not proceed with TEE.  ID recommends to continue ampicillin for now and repeat TTE in around 2 weeks. -currently on ampicillin.  No temperature spikes over the last 24 hours.  Blood pressure stable. -Repeat blood cultures from 02/21/2022 are negative so far  Goals of care Failure to thrive Dehydration/dysphagia/chronic cough CLL Physical deconditioning -overall difficult situation and complex case given progression of his CLL with progressive lymphadenopathy  and now complicated by persistent neutropenia and Enterococcus bacteremia with concern for infectious endocarditis.   -Palliative care following -Oncology following -Currently has NG feeding.  SLP still recommends n.p.o. except ice chips  Acute kidney injury on CKD stage IIIb -Baseline creatinine 1.4-1.6.  Worsening to 3.11 today.  Increase normal saline to 125 cc an hour.  We will check renal ultrasound.  Repeat a.m. labs.  Pancytopenia -Due to malignancy.  Patient received a few doses of Granix as per oncology recommendations for neutropenic fever.  Monitor  Hyperlipidemia -Continue statin  Essential hypertension  -continue amlodipine, metoprolol  Hyperlipidemia -Continue statin  Diabetes mellitus type 2 with hyperglycemia -Continue long-acting insulin.  Continue NovoLog and SSI  Severe protein calorie malnutrition -Dietitian following for tube feedings   DVT prophylaxis: Heparin subcutaneous Code Status: Full Family Communication: Wife at bedside Disposition Plan: Status is: Inpatient Remains inpatient appropriate because: Of severity of illness  Consultants: ID/oncology/palliative care  Procedures: 2D echo  Antimicrobials:  Anti-infectives (From admission, onward)    Start     Dose/Rate Route Frequency Ordered Stop   02/23/22 1900  ampicillin (OMNIPEN) 2 g in sodium chloride 0.9 % 100 mL IVPB        2 g 300 mL/hr over 20 Minutes Intravenous Every 8 hours 02/23/22 1059     02/21/22 1000  ampicillin (OMNIPEN) 2 g in sodium chloride 0.9 % 100 mL IVPB  Status:  Discontinued        2 g 300 mL/hr over 20 Minutes Intravenous Every 6 hours 02/21/22 0940 02/23/22 1059   02/21/22 0300  ampicillin (OMNIPEN) 2 g in  sodium chloride 0.9 % 100 mL IVPB  Status:  Discontinued        2 g 300 mL/hr over 20 Minutes Intravenous Every 8 hours 02/21/22 0215 02/21/22 0940   02/20/22 0815  vancomycin (VANCOREADY) IVPB 1250 mg/250 mL  Status:  Discontinued        1,250 mg 166.7 mL/hr  over 90 Minutes Intravenous Every 48 hours 02/20/22 0715 02/21/22 0215   02/20/22 0800  ceFEPIme (MAXIPIME) 2 g in sodium chloride 0.9 % 100 mL IVPB  Status:  Discontinued        2 g 200 mL/hr over 30 Minutes Intravenous Every 24 hours 02/20/22 0643 02/21/22 0215   02/20/22 0800  vancomycin (VANCOREADY) IVPB 1750 mg/350 mL  Status:  Discontinued        1,750 mg 175 mL/hr over 120 Minutes Intravenous Every 48 hours 02/20/22 0703 02/20/22 0715        Subjective: Patient seen and examined at bedside.  Poor historian.  No fever, vomiting, agitation, worsening shortness of breath reported.   Objective: Vitals:   02/25/22 1158 02/25/22 1306 02/25/22 2134 02/26/22 0541  BP:  (!) 123/51 (!) 113/50 (!) 130/50  Pulse:  97 (!) 104 (!) 105  Resp:  '20 20 20  '$ Temp:  97.7 F (36.5 C) 97.9 F (36.6 C) 98.3 F (36.8 C)  TempSrc:  Oral Oral Oral  SpO2:  100% 100% 100%  Weight: 61.4 kg     Height:        Intake/Output Summary (Last 24 hours) at 02/26/2022 0742 Last data filed at 02/26/2022 0600 Gross per 24 hour  Intake 420 ml  Output --  Net 420 ml    Filed Weights   02/21/22 0500 02/22/22 0424 02/25/22 1158  Weight: 55.2 kg 55.8 kg 61.4 kg    Examination:  General: Moderate distress.  Currently on room air.  Chronically ill and deconditioned looking. ENT/neck: NG tube is still present.  No palpable thyromegaly.   Respiratory: Bilateral decreased breath sounds at bases with scattered crackles CVS: Mild intermittent tachycardia present; S1 and S2 are abdominal: Soft, nontender, still remains distended; no organomegaly, bowel sounds are heard extremities: No clubbing; mild lower extremity edema present bilaterally  CNS: Very poor historian; alert.  Still very slow to respond.  Poor historian.  No focal neurologic deficit.  Moving extremities lymph: No palpable lymphadenopathy present Skin: No obvious petechiae/ecchymosis noted psych: No signs of agitation currently.  Affect is extremely  flat  Musculoskeletal: No obvious joint swelling/deformity    Data Reviewed: I have personally reviewed following labs and imaging studies  CBC: Recent Labs  Lab 02/22/22 0637 02/23/22 0542 02/24/22 0329 02/25/22 0424 02/26/22 0420  WBC 1.0* 1.6* 2.4* 1.6* 2.3*  NEUTROABS 0.3* 0.5* 0.8* 0.3* 1.4*  HGB 8.6* 8.4* 8.8* 8.7* 7.7*  HCT 27.1* 26.7* 29.0* 29.0* 25.0*  MCV 84.4 85.9 88.7 88.4 86.5  PLT 125* 127* 120* 87* 91*    Basic Metabolic Panel: Recent Labs  Lab 02/22/22 0637 02/22/22 1645 02/23/22 0542 02/23/22 1722 02/24/22 0329 02/24/22 1723 02/25/22 0424 02/26/22 0420  NA 129*  --  134*  --  138  --  139 139  K 3.8  --  3.8  --  4.0  --  4.0 4.1  CL 100  --  104  --  108  --  110 112*  CO2 22  --  23  --  21*  --  19* 21*  GLUCOSE 323*  --  353*  --  304*  --  221* 239*  BUN 36*  --  48*  --  61*  --  67* 83*  CREATININE 1.71*  --  2.06*  --  2.56*  --  2.61* 3.11*  CALCIUM 7.3*  --  7.2*  --  7.1*  --  6.7* 6.7*  MG 1.8 1.8 1.9 1.9 2.0 1.9 1.9 1.9  PHOS 1.9* 3.6 3.4 2.7 2.4* 1.6*  --   --     GFR: Estimated Creatinine Clearance: 16.7 mL/min (A) (by C-G formula based on SCr of 3.11 mg/dL (H)). Liver Function Tests: Recent Labs  Lab 02/20/22 0310 02/21/22 2563 02/22/22 0637 02/23/22 0542 02/25/22 0424  AST 18 31 32 29 30  ALT '15 26 29 31 27  '$ ALKPHOS 79 78 82 79 106  BILITOT 1.1 0.8 0.6 0.6 0.5  PROT 4.9* 4.7* 4.4* 4.2* 4.0*  ALBUMIN 2.4* 2.2* 1.9* 2.0* 1.7*    No results for input(s): LIPASE, AMYLASE in the last 168 hours. No results for input(s): AMMONIA in the last 168 hours. Coagulation Profile: No results for input(s): INR, PROTIME in the last 168 hours. Cardiac Enzymes: No results for input(s): CKTOTAL, CKMB, CKMBINDEX, TROPONINI in the last 168 hours. BNP (last 3 results) No results for input(s): PROBNP in the last 8760 hours. HbA1C: No results for input(s): HGBA1C in the last 72 hours. CBG: Recent Labs  Lab 02/25/22 1118  02/25/22 1626 02/25/22 2002 02/26/22 0036 02/26/22 0355  GLUCAP 240* 229* 200* 195* 214*    Lipid Profile: No results for input(s): CHOL, HDL, LDLCALC, TRIG, CHOLHDL, LDLDIRECT in the last 72 hours. Thyroid Function Tests: No results for input(s): TSH, T4TOTAL, FREET4, T3FREE, THYROIDAB in the last 72 hours. Anemia Panel: No results for input(s): VITAMINB12, FOLATE, FERRITIN, TIBC, IRON, RETICCTPCT in the last 72 hours. Sepsis Labs: Recent Labs  Lab 02/19/22 8937  LATICACIDVEN 1.8     Recent Results (from the past 240 hour(s))  Culture, blood (Routine X 2) w Reflex to ID Panel     Status: Abnormal   Collection Time: 02/20/22  7:50 AM   Specimen: BLOOD  Result Value Ref Range Status   Specimen Description BLOOD LEFT ANTECUBITAL  Final   Special Requests   Final    BOTTLES DRAWN AEROBIC AND ANAEROBIC Blood Culture adequate volume   Culture  Setup Time   Final    GRAM POSITIVE COCCI IN BOTH AEROBIC AND ANAEROBIC BOTTLES CRITICAL RESULT CALLED TO, READ BACK BY AND VERIFIED WITH: E JACKSON,PHARMD'@0200'$  02/21/22 [place]    Culture ENTEROCOCCUS FAECIUM (A)  Final   Report Status 02/22/2022 FINAL  Final   Organism ID, Bacteria ENTEROCOCCUS FAECIUM  Final      Susceptibility   Enterococcus faecium - MIC*    AMPICILLIN <=2 SENSITIVE Sensitive     VANCOMYCIN <=0.5 SENSITIVE Sensitive     GENTAMICIN SYNERGY SENSITIVE Sensitive     LINEZOLID Value in next row Sensitive      SENSITIVE2Performed at [facility], [address], [place], [place] [address]    * ENTEROCOCCUS FAECIUM  Blood Culture ID Panel (Reflexed)     Status: Abnormal   Collection Time: 02/20/22  7:50 AM  Result Value Ref Range Status   Enterococcus faecalis NOT DETECTED NOT DETECTED Final   Enterococcus Faecium DETECTED (A) NOT DETECTED Final    Comment: CRITICAL RESULT CALLED TO, READ BACK BY AND VERIFIED WITH: E JACKSON,PHARMD'@0200'$  02/21/22 [place]    Listeria monocytogenes NOT DETECTED NOT DETECTED Final  Staphylococcus species NOT DETECTED NOT DETECTED Final   Staphylococcus aureus (BCID) NOT DETECTED NOT DETECTED Final   Staphylococcus epidermidis NOT DETECTED NOT DETECTED Final   Staphylococcus lugdunensis NOT DETECTED NOT DETECTED Final   Streptococcus species NOT DETECTED NOT DETECTED Final   Streptococcus agalactiae NOT DETECTED NOT DETECTED Final   Streptococcus pneumoniae NOT DETECTED NOT DETECTED Final   Streptococcus pyogenes NOT DETECTED NOT DETECTED Final   A.calcoaceticus-baumannii NOT DETECTED NOT DETECTED Final   Bacteroides fragilis NOT DETECTED NOT DETECTED Final   Enterobacterales NOT DETECTED NOT DETECTED Final   Enterobacter cloacae complex NOT DETECTED NOT DETECTED Final   Escherichia coli NOT DETECTED NOT DETECTED Final   Klebsiella aerogenes NOT DETECTED NOT DETECTED Final   Klebsiella oxytoca NOT DETECTED NOT DETECTED Final   Klebsiella pneumoniae NOT DETECTED NOT DETECTED Final   Proteus species NOT DETECTED NOT DETECTED Final   Salmonella species NOT DETECTED NOT DETECTED Final   Serratia marcescens NOT DETECTED NOT DETECTED Final   Haemophilus influenzae NOT DETECTED NOT DETECTED Final   Neisseria meningitidis NOT DETECTED NOT DETECTED Final   Pseudomonas aeruginosa NOT DETECTED NOT DETECTED Final   Stenotrophomonas maltophilia NOT DETECTED NOT DETECTED Final   Candida albicans NOT DETECTED NOT DETECTED Final   Candida auris NOT DETECTED NOT DETECTED Final   Candida glabrata NOT DETECTED NOT DETECTED Final   Candida krusei NOT DETECTED NOT DETECTED Final   Candida parapsilosis NOT DETECTED NOT DETECTED Final   Candida tropicalis NOT DETECTED NOT DETECTED Final   Cryptococcus neoformans/gattii NOT DETECTED NOT DETECTED Final   Vancomycin resistance NOT DETECTED NOT DETECTED Final    Comment: Performed at [facility], [address], [place], [place] [address]  Culture, blood (Routine X 2) w Reflex to ID Panel     Status: Abnormal   Collection Time:  02/20/22  7:54 AM   Specimen: BLOOD  Result Value Ref Range Status   Specimen Description   Final    BLOOD BLOOD RIGHT FOREARM Performed at [facility] [address], [place], [place] [address]    Special Requests   Final    BOTTLES DRAWN AEROBIC AND ANAEROBIC Blood Culture adequate volume Performed at [facility] [address] [place], [place] [address]    Culture  Setup Time   Final    GRAM POSITIVE COCCI IN BOTH AEROBIC AND ANAEROBIC BOTTLES CRITICAL VALUE NOTED.  VALUE IS CONSISTENT WITH PREVIOUSLY REPORTED AND CALLED VALUE.    Culture (A)  Final    ENTEROCOCCUS FAECIUM SUSCEPTIBILITIES PERFORMED ON PREVIOUS CULTURE WITHIN THE LAST 5 DAYS. Performed at [facility], [place] [address] [place], [place] [address]    Report Status 02/23/2022 FINAL  Final  Culture, blood (Routine X 2) w Reflex to ID Panel     Status: None (Preliminary result)   Collection Time: 02/21/22  6:32 AM   Specimen: BLOOD  Result Value Ref Range Status   Specimen Description   Final    BLOOD BLOOD RIGHT FOREARM Performed at [facility] [address] [place], [place] [address]    Special Requests   Final    BOTTLES DRAWN AEROBIC ONLY Blood Culture adequate volume Performed at [facility] [address] [place], [place] [address]    Culture   Final    NO GROWTH 4 DAYS Performed at [facility], [place] [address] [place], [place] [address]    Report Status PENDING  Incomplete  Culture, blood (Routine X 2) w Reflex to ID Panel     Status: None (Preliminary result)   Collection Time: 02/21/22  6:32 AM   Specimen: BLOOD  Result Value Ref Range Status   Specimen Description   Final    BLOOD BLOOD RIGHT HAND Performed at [facility] [address] [place], [place] [address]    Special Requests   Final    IN PEDIATRIC BOTTLE Blood Culture results may not be optimal due to an inadequate  volume of blood received in culture bottles Performed at [facility] [address], [place], [place] [address]    Culture   Final    NO GROWTH 4 DAYS Performed at [facility], [place] [address] [place], [place] [address]    Report Status PENDING  Incomplete          Radiology Studies: No results found.      Scheduled Meds:  amLODipine  2.5 mg Per NG tube Daily   aspirin  81 mg Per Tube Daily   atorvastatin  40 mg Per Tube Daily   chlorhexidine  15 mL Mouth Rinse BID   feeding supplement (PROSource TF)  45 mL Per Tube BID   fiber  1 packet Per Tube TID   free water  150 mL Per Tube Q4H   guaiFENesin-dextromethorphan  5 mL Per Tube Q8H   heparin  5,000 Units Subcutaneous Q8H   insulin aspart  0-9 Units Subcutaneous Q4H   insulin aspart  6 Units Subcutaneous Q4H   insulin glargine-yfgn  15 Units Subcutaneous Daily   metoprolol tartrate  25 mg Per Tube BID   mirtazapine  7.5 mg Per Tube QHS   multivitamin with minerals  1 tablet Per Tube Daily   ondansetron  4 mg Per Tube Q8H   Or   ondansetron (ZOFRAN) IV  4 mg Intravenous Q8H   pantoprazole sodium  40 mg Per Tube BID   thiamine  100 mg Per Tube Daily   zinc sulfate  220 mg Per Tube Daily   Continuous Infusions:  sodium chloride Stopped (02/20/22 0850)   ampicillin (OMNIPEN) IV 2 g (02/26/22 0302)   feeding supplement (OSMOLITE 1.5 CAL) 1,000 mL (02/25/22 2313)          Aline August, MD [facility] 02/26/2022, 7:42 AM

## 2022-02-26 NOTE — TOC Benefit Eligibility Note (Signed)
Patient Teacher, English as a foreign language completed.    The patient is currently admitted and upon discharge could be taking linezolid (Zyvox) 600 mg tablets.  The current 14 day co-pay is, $4.14.   The patient is insured through [place], [place] Patient Advocate Specialist [place] Patient Advocate Team Direct Number: [phone]  Fax: [phone]

## 2022-02-26 NOTE — Progress Notes (Signed)
SLP Cancellation Note  Patient Details Name: [name] MRN: [id number] DOB: 05-22-1941   Cancelled treatment:       Reason Eval/Treat Not Completed: Other (comment) (Note palliative discussions underway determine GOC and if pt wishes to pursue PEG. Will follow up next week re: dysphagia management and will repeat MBS as indicated. Thanks.)  Kathleen Lime, MS [facility] SLP Acute Rehab Services Office [phone] Pager [phone]  [name] 02/26/2022, 4:49 PM

## 2022-02-26 NOTE — Progress Notes (Signed)
Palliative Care Progress Note, Assessment & Plan   Patient Name: [name]       Date: 02/26/2022 DOB: Oct 25, 1940  Age: 81 y.o. MRN#: [id number] Attending Physician: Aline August, MD Primary Care Physician: [name], MD Admit Date: 02/19/2022  Reason for Consultation/Follow-up: Establishing goals of care  Subjective: Patient is lying in bed with core track to left nare.  Patient acknowledges my presence and is able to make his wishes known.  He has no acute complaints.  Wife is at bedside.  HPI: 81 y.o. male  with past medical history of CLL with recurrence, DM2, HTN, and colorectal cancer (2009) admitted on 02/19/2022 with progressive, generalized weakness. Pt endorses poor appetite and weight loss over the last several month. He was evaluated with full work up by GI in February 2023 with no significant findings. His Erlene Senters was stopped in April in hopes of relieving weakness/poor appetite. Two weeks later he was hospitalized for dehydration and placed back on Imbruvica. He decliend at home and returned to ED for this admission.    On admission, CT of abd/pelvis reveal recurrence of CLL. Pt is being treated for possible endocarditis and pericardial effusion, bacteremia, neutropenia, and thrombocytopenia. Pt is being followed by ID and oncology. After reviewing ID recommendations, patient has opted for oral abx tx for 2 weeks and repeat TTE and surveillant blood cultures.    PMT was consulted to discuss [place].   Summary of counseling/coordination of care: Reviewing the patient's chart and assessing the patient at bedside, I spoke with patient and his wife regarding nutrition and hydration.  Reviewed that core track is a temporary means.  Patient says he wants it out since it is causing him severe  diarrhea and discomfort in his throat.  Reviewed that if patient removes core track his options include the following:  1- Remove cortrak and placement of PEG tube for nutritional support through his stomach. Discussed risks and benefits of PEG placement. Also discussed that diarrhea would likely continue but may resolve.   2- Removal of cortrak without placement of PEG.  Patient would be allowed to eat and drink with understanding he may aspirate and would liekly not be able to keep up with his own caloric and hydration needs.  This would be approaching EOL and natural disease process would occur.  Pt and wife verbalized understanding. Neither patient nor wife were willing to make decision regarding PEG today. Patient shared he would like to participate in swallow exercises and would like to continue to work with SLP to improve swallowing. I shared I would speak with SLP again to confirm if anything further can be clarified as far as likelihood of improving swallowing/dysphagia standpoint.  I spoke with SLP therapist Tammy.  She confirmed that patient's swallowing has not improved.  If patient would like to proceed with PEG, MBS study could be performed prior to discharge from hospital to determine dietary recommendations for home. MBS with cortrak is not recommended.   Also consulted with attending Dr. Starla Link who shared that patient and wife will speak with Dr.Shaddad - oncology - to review long term treatment options and plans.   I plan to round on the patient again this afternoon  and share these updates.  Code Status: DNR  Prognosis: Unable to determine  Discharge Planning: To Be Determined  Care plan was discussed with SLP Tammy, Dr. Starla Link, patient, patient's wife  Physical Exam Vitals reviewed.  Constitutional:      Comments: Thin, frail  HENT:     Head: Normocephalic.     Nose:     Comments: Cortrak to left nare Eyes:     Pupils: Pupils are equal, round, and reactive to light.   Cardiovascular:     Rate and Rhythm: Normal rate. Rhythm irregular.     Pulses: Normal pulses.  Pulmonary:     Effort: Pulmonary effort is normal.  Abdominal:     Palpations: Abdomen is soft.  Musculoskeletal:     Comments: Generalized weakness  Skin:    General: Skin is warm and dry.  Neurological:     Mental Status: He is alert. Mental status is at baseline.  Psychiatric:        Mood and Affect: Mood normal.        Behavior: Behavior normal.        Thought Content: Thought content normal.        Judgment: Judgment normal.            Palliative Assessment/Data: 60%    Total Time 50 minutes  Greater than 50%  of this time was spent counseling and coordinating care related to the above assessment and plan.  Thank you for allowing the Palliative Medicine Team to assist in the care of this patient.  [place] Ilsa Iha, FNP-BC Palliative Medicine Team Team Phone # [phone]

## 2022-02-27 DIAGNOSIS — R7881 Bacteremia: Secondary | ICD-10-CM | POA: Diagnosis not present

## 2022-02-27 DIAGNOSIS — B952 Enterococcus as the cause of diseases classified elsewhere: Secondary | ICD-10-CM | POA: Diagnosis not present

## 2022-02-27 DIAGNOSIS — N179 Acute kidney failure, unspecified: Secondary | ICD-10-CM | POA: Diagnosis not present

## 2022-02-27 DIAGNOSIS — C9110 Chronic lymphocytic leukemia of B-cell type not having achieved remission: Secondary | ICD-10-CM | POA: Diagnosis not present

## 2022-02-27 DIAGNOSIS — D701 Agranulocytosis secondary to cancer chemotherapy: Secondary | ICD-10-CM | POA: Diagnosis not present

## 2022-02-27 DIAGNOSIS — R627 Adult failure to thrive: Secondary | ICD-10-CM | POA: Diagnosis not present

## 2022-02-27 LAB — GLUCOSE, CAPILLARY
Glucose-Capillary: 164 mg/dL — ABNORMAL HIGH (ref 70–99)
Glucose-Capillary: 226 mg/dL — ABNORMAL HIGH (ref 70–99)
Glucose-Capillary: 201 mg/dL — ABNORMAL HIGH (ref 70–99)
Glucose-Capillary: 146 mg/dL — ABNORMAL HIGH (ref 70–99)
Glucose-Capillary: 124 mg/dL — ABNORMAL HIGH (ref 70–99)
Glucose-Capillary: 142 mg/dL — ABNORMAL HIGH (ref 70–99)

## 2022-02-27 LAB — COMPREHENSIVE METABOLIC PANEL
ALT: 26 U/L (ref 0–44)
AST: 23 U/L (ref 15–41)
Albumin: 1.5 g/dL — ABNORMAL LOW (ref 3.5–5.0)
Alkaline Phosphatase: 109 U/L (ref 38–126)
Anion gap: 5 (ref 5–15)
BUN: 86 mg/dL — ABNORMAL HIGH (ref 8–23)
CO2: 20 mmol/L — ABNORMAL LOW (ref 22–32)
Calcium: 6.4 mg/dL — CL (ref 8.9–10.3)
Chloride: 113 mmol/L — ABNORMAL HIGH (ref 98–111)
Creatinine, Ser: 3.22 mg/dL — ABNORMAL HIGH (ref 0.61–1.24)
GFR, Estimated: 19 mL/min — ABNORMAL LOW (ref >60)
Glucose, Bld: 179 mg/dL — ABNORMAL HIGH (ref 70–99)
Potassium: 4.3 mmol/L (ref 3.5–5.1)
Sodium: 138 mmol/L (ref 135–145)
Total Bilirubin: 0.6 mg/dL (ref 0.3–1.2)
Total Protein: 3.7 g/dL — ABNORMAL LOW (ref 6.5–8.1)

## 2022-02-27 LAB — CBC WITH DIFFERENTIAL/PLATELET
Abs Immature Granulocytes: 0.11 10*3/uL — ABNORMAL HIGH (ref 0.00–0.07)
Basophils Absolute: 0 10*3/uL (ref 0.0–0.1)
Basophils Relative: 0 %
Eosinophils Absolute: 0 10*3/uL (ref 0.0–0.5)
Eosinophils Relative: 1 %
HCT: 24.4 % — ABNORMAL LOW (ref 39.0–52.0)
Hemoglobin: 7.5 g/dL — ABNORMAL LOW (ref 13.0–17.0)
Immature Granulocytes: 4 %
Lymphocytes Relative: 31 %
Lymphs Abs: 0.9 10*3/uL (ref 0.7–4.0)
MCH: 26.3 pg (ref 26.0–34.0)
MCHC: 30.7 g/dL (ref 30.0–36.0)
MCV: 85.6 fL (ref 80.0–100.0)
Monocytes Absolute: 0.1 10*3/uL (ref 0.1–1.0)
Monocytes Relative: 5 %
Neutro Abs: 1.7 10*3/uL (ref 1.7–7.7)
Neutrophils Relative %: 59 %
Platelets: 91 10*3/uL — ABNORMAL LOW (ref 150–400)
RBC: 2.85 MIL/uL — ABNORMAL LOW (ref 4.22–5.81)
RDW: 18.5 % — ABNORMAL HIGH (ref 11.5–15.5)
WBC: 2.9 10*3/uL — ABNORMAL LOW (ref 4.0–10.5)
nRBC: 0 % (ref 0.0–0.2)

## 2022-02-27 LAB — SODIUM, URINE, RANDOM: Sodium, Ur: 49 mmol/L

## 2022-02-27 LAB — MAGNESIUM: Magnesium: 2 mg/dL (ref 1.7–2.4)

## 2022-02-27 LAB — CREATININE, URINE, RANDOM: Creatinine, Urine: 81.11 mg/dL

## 2022-02-27 MED ORDER — FUROSEMIDE 10 MG/ML IJ SOLN
40.0000 mg | Freq: Three times a day (TID) | INTRAMUSCULAR | Status: DC
  Administered 2022-02-27 – 2022-02-28 (×2): 40 mg via INTRAVENOUS
  Filled 2022-02-27 (×2): qty 4

## 2022-02-27 MED ORDER — LOPERAMIDE HCL 1 MG/7.5ML PO SUSP: 2.0000 mg | Freq: Four times a day (QID) | ORAL | Status: DC | PRN

## 2022-02-27 MED ORDER — METOPROLOL TARTRATE 25 MG PO TABS
12.5000 mg | ORAL_TABLET | Freq: Two times a day (BID) | ORAL | Status: DC
  Administered 2022-02-27 – 2022-03-02 (×3): 12.5 mg
  Filled 2022-02-27 (×3): qty 1

## 2022-02-27 MED ORDER — FUROSEMIDE 10 MG/ML IJ SOLN
60.0000 mg | Freq: Once | INTRAMUSCULAR | Status: AC
  Administered 2022-02-27: 60 mg via INTRAVENOUS
  Filled 2022-02-27: qty 6

## 2022-02-27 NOTE — Progress Notes (Addendum)
Palliative Care Progress Note, Assessment & Plan   Patient Name: [name]       Date: 02/27/2022 DOB: 14-Apr-1941  Age: 81 y.o. MRN#: [id number] Attending Physician: Aline August, MD Primary Care Physician: [name], MD Admit Date: 02/19/2022  Reason for Consultation/Follow-up: Establishing goals of care  Subjective: Patient is sitting in bed in no apparent distress.  Core track to left nare is running without complication.  No family at bedside.  Patient is able to acknowledge my presence and make his wishes known.  Patient has no acute complaints.  HPI: 81 y.o. male  with past medical history of CLL with recurrence, DM2, HTN, and colorectal cancer (2009) admitted on 02/19/2022 with progressive, generalized weakness. Pt endorses poor appetite and weight loss over the last several month. He was evaluated with full work up by GI in February 2023 with no significant findings. His Erlene Senters was stopped in April in hopes of relieving weakness/poor appetite. Two weeks later he was hospitalized for dehydration and placed back on Imbruvica. He decliend at home and returned to ED for this admission.    On admission, CT of abd/pelvis reveal recurrence of CLL. Pt is being treated for possible endocarditis and pericardial effusion, bacteremia, neutropenia, and thrombocytopenia. Pt is being followed by ID and oncology. After reviewing ID recommendations, patient has opted for oral abx tx for 2 weeks and repeat TTE and surveillant blood cultures.    PMT was consulted to discuss Capac.   Summary of counseling/coordination of care: After reviewing the patient's chart and assessing the patient at bedside, I spoke with patient in regards to PEG tube placement.  We again discussed the pros and cons of PEG tube placement  versus removal of core track without PEG tube placement.  Patient was able to verbalize understanding and repeat back pros and cons of both options.  I again highlighted pros of PEG tube placement are that nutrition is being received.  Reviewed risk associated such as dislodgment, insertion site infection and aspiration.  Patient asked appropriate questions.  He was tearful during our discussions and shared that "this is a big decision".  Therapeutic silence and active listening provided for patient to share his thoughts and emotions regarding his current health status.  Patient verbalized that he did not want to have the PEG tube placed.  He was in agreement to have cortrak removed and an MBS on Monday to help determine diet recommendations at discharge.  We also discussed that patient should speak with oncology in regards to long-term treatment options.  Reviewed that functional, nutritional, and cognitive status will be taken into account when determining what treatments are appropriate for patient in the future.  After our discussion, I spoke with patient's wife [name] over the phone.  I reiterated the patient's wishes to not have PEG tube placed.  Wife was in agreement and said she supports patient's decision.  We reviewed that plan is for core track to be removed and MBS study to occur on Monday prior to discharge to help determine patient's diet recommendations.  SLP therapist and Dr. Starla Link made aware that patient declines PEG tube at this time.  Code Status: DNR  Prognosis: Unable to determine  Discharge Planning: Home with palliative services to follow at [place] was discussed with patient, nursing, patient's wife, Dr. Starla Link, SLP therapist Darliss Cheney  Physical Exam Vitals reviewed.  Constitutional:      Appearance: Normal appearance.  HENT:     Head: Normocephalic and atraumatic.     Mouth/Throat:     Mouth: Mucous membranes are moist.     Comments:  Cortrak to left nare Eyes:     Pupils: Pupils are equal, round, and reactive to light.  Cardiovascular:     Rate and Rhythm: Normal rate.     Pulses: Normal pulses.  Pulmonary:     Effort: Pulmonary effort is normal.  Abdominal:     Palpations: Abdomen is soft.  Musculoskeletal:        General: Normal range of motion.  Skin:    General: Skin is warm and dry.  Neurological:     Mental Status: He is alert and oriented to person, place, and time. Mental status is at baseline.  Psychiatric:        Mood and Affect: Mood normal.        Behavior: Behavior normal.        Thought Content: Thought content normal.        Judgment: Judgment normal.            Palliative Assessment/Data: 60%    Total Time 35 minutes  Greater than 50%  of this time was spent counseling and coordinating care related to the above assessment and plan.  Thank you for allowing the Palliative Medicine Team to assist in the care of this patient.  [place] Ilsa Iha, FNP-BC Palliative Medicine Team Team Phone # [phone]

## 2022-02-27 NOTE — Progress Notes (Signed)
Physical Therapy Treatment Patient Details Name: [name] MRN: [id number] DOB: 24-Apr-1941 Today's Date: 02/27/2022   History of Present Illness 81 year old male with history of CLL initially diagnosed in 2014, status postchemotherapy and currently on Imbruvica, DM 2, HTN, colorectal cancer 2009, who came into the hospital with progressive and generalized weakness and admitted 02/19/22 with Bacteremia due to Enterococcus    PT Comments    Pt motivated to get out of bed, able to perform 3 STS reps from EOB, VC for flat foot posture and shifting weight anterior with rising. With initial rising, pt reports spinning and dizziness, improves with seated rest and doesn't occur again. Pt ambulates around bed ~12 ft with RW, very slow and narrow steps, ataxic-like at times due to increased shakiness with distance, no overt LOB. +2 for safety for line management. Pt ended session in recliner with RN in room providing medication.  RN later found PT and reports pt tolerated <10 minutes up in recliner due to peri pain.   Recommendations for follow up therapy are one component of a multi-disciplinary discharge planning process, led by the attending physician.  Recommendations may be updated based on patient status, additional functional criteria and insurance authorization.  Follow Up Recommendations  Home health PT     Assistance Recommended at Discharge Intermittent Supervision/Assistance  Patient can return home with the following A little help with walking and/or transfers;A little help with bathing/dressing/bathroom;Assistance with cooking/housework;Assist for transportation   Equipment Recommendations  None recommended by PT    Recommendations for Other Services       Precautions / Restrictions Precautions Precautions: Fall Precaution Comments: feeding tube, flexiseal Restrictions Weight Bearing Restrictions: No     Mobility  Bed Mobility Overal bed mobility: Modified Independent   General bed mobility comments: increased time and effort, use of bedrails to self assist    Transfers Overall transfer level: Needs assistance Equipment used: Rolling walker (2 wheels) Transfers: Sit to/from Stand Sit to Stand: Min assist  General transfer comment: BUE assisting to power up to standing, BLE braced against bed, VC for flat foot contact, assist to steady with rising, able to complete 3 reps    Ambulation/Gait Ambulation/Gait assistance: Min assist, +2 safety/equipment Gait Distance (Feet): 12 Feet Assistive device: Rolling walker (2 wheels) Gait Pattern/deviations: Step-through pattern, Decreased stride length, Narrow base of support Gait velocity: decreased  General Gait Details: very slow step through pattern with narrow BOS and short steps, increased shakiness causing ataxic like step progression without overt LOB, generally unsteady, fatigues with distance, +2 for line management   Stairs             Wheelchair Mobility    Modified Rankin (Stroke Patients Only)       Balance Overall balance assessment: History of Falls, Needs assistance Sitting-balance support: No upper extremity supported, Feet supported Sitting balance-Leahy Scale: Fair  Standing balance support: During functional activity, Bilateral upper extremity supported, Reliant on assistive device for balance Standing balance-Leahy Scale: Poor Standing balance comment: reliant on UE support     Cognition Arousal/Alertness: Awake/alert Behavior During Therapy: WFL for tasks assessed/performed Overall Cognitive Status: Within Functional Limits for tasks assessed  General Comments: pt very pleasant and motivated        Exercises      General Comments        Pertinent Vitals/Pain Pain Assessment Pain Assessment: Faces Faces Pain Scale: Hurts little more Pain Location: periarea Pain Descriptors / Indicators: Grimacing, Tender Pain Intervention(s): Limited activity within patient's  tolerance, Monitored during session    Home Living                          Prior Function            PT Goals (current goals can now be found in the care plan section) Acute Rehab PT Goals PT Goal Formulation: With patient Time For Goal Achievement: 03/10/22 Potential to Achieve Goals: Good Progress towards PT goals: Progressing toward goals    Frequency    Min 3X/week      PT Plan Current plan remains appropriate    Co-evaluation              AM-PAC PT "6 Clicks" Mobility   Outcome Measure  Help needed turning from your back to your side while in a flat bed without using bedrails?: A Little Help needed moving from lying on your back to sitting on the side of a flat bed without using bedrails?: A Little Help needed moving to and from a bed to a chair (including a wheelchair)?: A Little Help needed standing up from a chair using your arms (e.g., wheelchair or bedside chair)?: A Little Help needed to walk in hospital room?: A Lot Help needed climbing 3-5 steps with a railing? : A Lot 6 Click Score: 16    End of Session Equipment Utilized During Treatment: Gait belt Activity Tolerance: Patient tolerated treatment well Patient left: in chair;with call bell/phone within reach;with nursing/sitter in room Nurse Communication: Mobility status PT Visit Diagnosis: Difficulty in walking, not elsewhere classified (R26.2);Muscle weakness (generalized) (M62.81)     Time: 4496-7591 PT Time Calculation (min) (ACUTE ONLY): 20 min  Charges:  $Therapeutic Activity: 8-22 mins                      Tori Kellen Hover PT, DPT 02/27/22, 12:08 PM

## 2022-02-27 NOTE — Progress Notes (Signed)
PROGRESS NOTE    [name]  NWG:[id number] DOB: Jan 30, 1941 DOA: 02/19/2022 PCP: [name], MD   Brief Narrative:  81 year old male with history of CLL initially diagnosed in 2014, status postchemotherapy and currently on Imbruvica, DM 2, HTN, colorectal cancer 2009, who came into the hospital with progressive and generalized weakness.  Patient has been experiencing some weight loss and poor appetite for the past several months, was seen by GI in February as an outpatient and underwent work-up without any significant major findings.  He was taken off Imbruvica in April as it was believed that it may be related to that, however he continued to decline.  He was hospitalized just 2 weeks ago for dehydration, and when he left the hospital he was placed back on his Imbruvica.  While at home he continued to decline and was rehospitalized on 5/25.  Repeat CT scan of the abdomen and pelvis showed recurrence of the CLL, he was also found to be neutropenic, have a fever and have Enterococcus bacteremia.  Oncology and ID consulted.  Palliative care has also been consulted for goals of care discussion.  Assessment & Plan:   Sepsis: Present on admission Enterococcus faecium bacteremia Neutropenic fever -2D echo showed normal EF of 60 to 65%, small pericardial effusion,  there was also a nodular calcification in the right coronary cusp likely sclerosis but if high suspicion for SBE, consider TEE.  ID following and recommending TEE.  Cardiology was the opinion that patient is high risk for TEE.  Patient has opted to not proceed with TEE.  ID recommends to continue ampicillin for now and repeat TTE in around 2 weeks. -currently on ampicillin.  No temperature spikes over the last 24 hours.  Blood pressure stable. -Repeat blood cultures from 02/21/2022 are negative so far  Goals of care Failure to thrive Dehydration/dysphagia/chronic cough CLL Physical deconditioning -overall difficult situation and  complex case given progression of his CLL with progressive lymphadenopathy and now complicated by persistent neutropenia and Enterococcus bacteremia with concern for infectious endocarditis.   -Palliative care following -Oncology following -Currently has Cortrak feeding.  Patient is requesting that the Cortrak tube be removed because of persistent diarrhea.  He has not made up his mind about PEG tube placement/feeding. SLP still recommends n.p.o. except ice chips -Dietitian following  Acute kidney injury on CKD stage IIIb -Baseline creatinine 1.4-1.6.  Worsening to 3.22 today.  Currently on normal saline at 125 cc an hour.  Patient is getting volume overloaded.  Might need to discontinue IV fluids and start Lasix.  Renal ultrasound negative for hydronephrosis.  Repeat a.m. labs.  I have consulted nephrology.  Follow recommendations.  Pancytopenia -Due to malignancy.  Patient received a few doses of Granix as per oncology recommendations for neutropenic fever.  Monitor  Hyperlipidemia -Continue statin  Essential hypertension  -continue amlodipine, metoprolol  Hyperlipidemia -Continue statin  Diabetes mellitus type 2 with hyperglycemia -Continue long-acting insulin.  Continue NovoLog and SSI  Severe protein calorie malnutrition/hypoalbuminemia -Dietitian following for tube feedings   DVT prophylaxis: Heparin subcutaneous Code Status: Full Family Communication: Wife at bedside Disposition Plan: Status is: Inpatient Remains inpatient appropriate because: Of severity of illness  Consultants: ID/oncology/palliative care  Procedures: 2D echo  Antimicrobials:  Anti-infectives (From admission, onward)    Start     Dose/Rate Route Frequency Ordered Stop   02/23/22 1900  ampicillin (OMNIPEN) 2 g in sodium chloride 0.9 % 100 mL IVPB        2 g 300 mL/hr  over 20 Minutes Intravenous Every 8 hours 02/23/22 1059     02/21/22 1000  ampicillin (OMNIPEN) 2 g in sodium chloride 0.9 % 100 mL  IVPB  Status:  Discontinued        2 g 300 mL/hr over 20 Minutes Intravenous Every 6 hours 02/21/22 0940 02/23/22 1059   02/21/22 0300  ampicillin (OMNIPEN) 2 g in sodium chloride 0.9 % 100 mL IVPB  Status:  Discontinued        2 g 300 mL/hr over 20 Minutes Intravenous Every 8 hours 02/21/22 0215 02/21/22 0940   02/20/22 0815  vancomycin (VANCOREADY) IVPB 1250 mg/250 mL  Status:  Discontinued        1,250 mg 166.7 mL/hr over 90 Minutes Intravenous Every 48 hours 02/20/22 0715 02/21/22 0215   02/20/22 0800  ceFEPIme (MAXIPIME) 2 g in sodium chloride 0.9 % 100 mL IVPB  Status:  Discontinued        2 g 200 mL/hr over 30 Minutes Intravenous Every 24 hours 02/20/22 0643 02/21/22 0215   02/20/22 0800  vancomycin (VANCOREADY) IVPB 1750 mg/350 mL  Status:  Discontinued        1,750 mg 175 mL/hr over 120 Minutes Intravenous Every 48 hours 02/20/22 0703 02/20/22 0715        Subjective: Patient seen and examined at bedside.  Poor historian.  No agitation, fever, vomiting reported.  Complains of diarrhea.   Objective: Vitals:   02/26/22 1328 02/26/22 2022 02/27/22 0411 02/27/22 0421  BP: 113/67 (!) 136/56 (!) 133/55   Pulse: (!) 102  100   Resp: 16 20    Temp: 97.9 F (36.6 C) 98.9 F (37.2 C) 98.9 F (37.2 C)   TempSrc: Oral Oral Oral   SpO2: 100% 97% 100%   Weight:    65.7 kg  Height:        Intake/Output Summary (Last 24 hours) at 02/27/2022 0818 Last data filed at 02/26/2022 2343 Gross per 24 hour  Intake 526.58 ml  Output 500 ml  Net 26.58 ml    Filed Weights   02/22/22 0424 02/25/22 1158 02/27/22 0421  Weight: 55.8 kg 61.4 kg 65.7 kg    Examination:  General: Still on room air.  No acute distress.  Chronically ill and deconditioned looking. ENT/neck: No obvious thyromegaly.  NG tube placement is not  respiratory: Decreased breath sounds at bases bilaterally with some crackles, no wheezing  CVS: Intermittently tachycardic; S1 and S2 heard  abdominal: Soft, nontender,  distended slightly; no organomegaly, normal bowel sounds heard extremities: Bilateral lower extremity edema present; no cyanosis CNS: Very poor historian; awake.  Still slow to respond.  No focal neurologic deficit.  He is able to move extremities  lymph: No obvious lymphadenopathy noted  skin: No obvious rashes/petechiae  psych: Very flat affect.  Not showing signs of agitation  musculoskeletal: No obvious joint swelling/deformity    Data Reviewed: I have personally reviewed following labs and imaging studies  CBC: Recent Labs  Lab 02/23/22 0542 02/24/22 0329 02/25/22 0424 02/26/22 0420 02/27/22 0352  WBC 1.6* 2.4* 1.6* 2.3* 2.9*  NEUTROABS 0.5* 0.8* 0.3* 1.4* 1.7  HGB 8.4* 8.8* 8.7* 7.7* 7.5*  HCT 26.7* 29.0* 29.0* 25.0* 24.4*  MCV 85.9 88.7 88.4 86.5 85.6  PLT 127* 120* 87* 91* 91*    Basic Metabolic Panel: Recent Labs  Lab 02/22/22 1645 02/23/22 0542 02/23/22 1722 02/24/22 0329 02/24/22 1723 02/25/22 0424 02/26/22 0420 02/27/22 0352  NA  --  134*  --  138  --  139 139 138  K  --  3.8  --  4.0  --  4.0 4.1 4.3  CL  --  104  --  108  --  110 112* 113*  CO2  --  23  --  21*  --  19* 21* 20*  GLUCOSE  --  353*  --  304*  --  221* 239* 179*  BUN  --  48*  --  61*  --  67* 83* 86*  CREATININE  --  2.06*  --  2.56*  --  2.61* 3.11* 3.22*  CALCIUM  --  7.2*  --  7.1*  --  6.7* 6.7* 6.4*  MG 1.8 1.9 1.9 2.0 1.9 1.9 1.9 2.0  PHOS 3.6 3.4 2.7 2.4* 1.6*  --   --   --     GFR: Estimated Creatinine Clearance: 17.3 mL/min (A) (by C-G formula based on SCr of 3.22 mg/dL (H)). Liver Function Tests: Recent Labs  Lab 02/21/22 6010 02/22/22 0637 02/23/22 0542 02/25/22 0424 02/27/22 0352  AST 31 32 '29 30 23  '$ ALT '26 29 31 27 26  '$ ALKPHOS 78 82 79 106 109  BILITOT 0.8 0.6 0.6 0.5 0.6  PROT 4.7* 4.4* 4.2* 4.0* 3.7*  ALBUMIN 2.2* 1.9* 2.0* 1.7* 1.5*    No results for input(s): LIPASE, AMYLASE in the last 168 hours. No results for input(s): AMMONIA in the last 168  hours. Coagulation Profile: No results for input(s): INR, PROTIME in the last 168 hours. Cardiac Enzymes: No results for input(s): CKTOTAL, CKMB, CKMBINDEX, TROPONINI in the last 168 hours. BNP (last 3 results) No results for input(s): PROBNP in the last 8760 hours. HbA1C: No results for input(s): HGBA1C in the last 72 hours. CBG: Recent Labs  Lab 02/26/22 1610 02/26/22 2019 02/26/22 2334 02/27/22 0419 02/27/22 0729  GLUCAP 221* 198* 197* 164* 226*    Lipid Profile: No results for input(s): CHOL, HDL, LDLCALC, TRIG, CHOLHDL, LDLDIRECT in the last 72 hours. Thyroid Function Tests: No results for input(s): TSH, T4TOTAL, FREET4, T3FREE, THYROIDAB in the last 72 hours. Anemia Panel: No results for input(s): VITAMINB12, FOLATE, FERRITIN, TIBC, IRON, RETICCTPCT in the last 72 hours. Sepsis Labs: No results for input(s): PROCALCITON, LATICACIDVEN in the last 168 hours.   Recent Results (from the past 240 hour(s))  Culture, blood (Routine X 2) w Reflex to ID Panel     Status: Abnormal   Collection Time: 02/20/22  7:50 AM   Specimen: BLOOD  Result Value Ref Range Status   Specimen Description BLOOD LEFT ANTECUBITAL  Final   Special Requests   Final    BOTTLES DRAWN AEROBIC AND ANAEROBIC Blood Culture adequate volume   Culture  Setup Time   Final    GRAM POSITIVE COCCI IN BOTH AEROBIC AND ANAEROBIC BOTTLES CRITICAL RESULT CALLED TO, READ BACK BY AND VERIFIED WITH: E JACKSON,PHARMD'@0200'$  02/21/22 [place]    Culture ENTEROCOCCUS FAECIUM (A)  Final   Report Status 02/22/2022 FINAL  Final   Organism ID, Bacteria ENTEROCOCCUS FAECIUM  Final      Susceptibility   Enterococcus faecium - MIC*    AMPICILLIN <=2 SENSITIVE Sensitive     VANCOMYCIN <=0.5 SENSITIVE Sensitive     GENTAMICIN SYNERGY SENSITIVE Sensitive     LINEZOLID Value in next row Sensitive      SENSITIVE2Performed at [facility], [address], [place], [place] [address]    * ENTEROCOCCUS FAECIUM  Blood Culture ID  Panel (Reflexed)  Status: Abnormal   Collection Time: 02/20/22  7:50 AM  Result Value Ref Range Status   Enterococcus faecalis NOT DETECTED NOT DETECTED Final   Enterococcus Faecium DETECTED (A) NOT DETECTED Final    Comment: CRITICAL RESULT CALLED TO, READ BACK BY AND VERIFIED WITH: E JACKSON,PHARMD'@0200'$  02/21/22 [place]    Listeria monocytogenes NOT DETECTED NOT DETECTED Final   Staphylococcus species NOT DETECTED NOT DETECTED Final   Staphylococcus aureus (BCID) NOT DETECTED NOT DETECTED Final   Staphylococcus epidermidis NOT DETECTED NOT DETECTED Final   Staphylococcus lugdunensis NOT DETECTED NOT DETECTED Final   Streptococcus species NOT DETECTED NOT DETECTED Final   Streptococcus agalactiae NOT DETECTED NOT DETECTED Final   Streptococcus pneumoniae NOT DETECTED NOT DETECTED Final   Streptococcus pyogenes NOT DETECTED NOT DETECTED Final   A.calcoaceticus-baumannii NOT DETECTED NOT DETECTED Final   Bacteroides fragilis NOT DETECTED NOT DETECTED Final   Enterobacterales NOT DETECTED NOT DETECTED Final   Enterobacter cloacae complex NOT DETECTED NOT DETECTED Final   Escherichia coli NOT DETECTED NOT DETECTED Final   Klebsiella aerogenes NOT DETECTED NOT DETECTED Final   Klebsiella oxytoca NOT DETECTED NOT DETECTED Final   Klebsiella pneumoniae NOT DETECTED NOT DETECTED Final   Proteus species NOT DETECTED NOT DETECTED Final   Salmonella species NOT DETECTED NOT DETECTED Final   Serratia marcescens NOT DETECTED NOT DETECTED Final   Haemophilus influenzae NOT DETECTED NOT DETECTED Final   Neisseria meningitidis NOT DETECTED NOT DETECTED Final   Pseudomonas aeruginosa NOT DETECTED NOT DETECTED Final   Stenotrophomonas maltophilia NOT DETECTED NOT DETECTED Final   Candida albicans NOT DETECTED NOT DETECTED Final   Candida auris NOT DETECTED NOT DETECTED Final   Candida glabrata NOT DETECTED NOT DETECTED Final   Candida krusei NOT DETECTED NOT DETECTED Final   Candida parapsilosis NOT  DETECTED NOT DETECTED Final   Candida tropicalis NOT DETECTED NOT DETECTED Final   Cryptococcus neoformans/gattii NOT DETECTED NOT DETECTED Final   Vancomycin resistance NOT DETECTED NOT DETECTED Final    Comment: Performed at [facility], [address] [place], [place] [address]  Culture, blood (Routine X 2) w Reflex to ID Panel     Status: Abnormal   Collection Time: 02/20/22  7:54 AM   Specimen: BLOOD  Result Value Ref Range Status   Specimen Description   Final    BLOOD BLOOD RIGHT FOREARM Performed at [facility] [address] [place], [place] [address]    Special Requests   Final    BOTTLES DRAWN AEROBIC AND ANAEROBIC Blood Culture adequate volume Performed at [facility] [address], [place], [place] [address]    Culture  Setup Time   Final    GRAM POSITIVE COCCI IN BOTH AEROBIC AND ANAEROBIC BOTTLES CRITICAL VALUE NOTED.  VALUE IS CONSISTENT WITH PREVIOUSLY REPORTED AND CALLED VALUE.    Culture (A)  Final    ENTEROCOCCUS FAECIUM SUSCEPTIBILITIES PERFORMED ON PREVIOUS CULTURE WITHIN THE LAST 5 DAYS. Performed at [facility], [place] [address] [place],  [address]    Report Status 02/23/2022 FINAL  Final  Culture, blood (Routine X 2) w Reflex to ID Panel     Status: None   Collection Time: 02/21/22  6:32 AM   Specimen: BLOOD  Result Value Ref Range Status   Specimen Description   Final    BLOOD BLOOD RIGHT FOREARM Performed at [facility] [address] [place],  [address]    Special Requests  Final    BOTTLES DRAWN AEROBIC ONLY Blood Culture adequate volume Performed at [facility] [address], [place], [place] [address]    Culture   Final    NO GROWTH 5 DAYS Performed at [facility], [place] [address], [place], [place] [address]    Report Status 02/26/2022 FINAL  Final  Culture, blood (Routine X 2) w Reflex to ID Panel     Status: None    Collection Time: 02/21/22  6:32 AM   Specimen: BLOOD  Result Value Ref Range Status   Specimen Description   Final    BLOOD BLOOD RIGHT HAND Performed at [facility] [address], [place], [place] [address]    Special Requests   Final    IN PEDIATRIC BOTTLE Blood Culture results may not be optimal due to an inadequate volume of blood received in culture bottles Performed at [facility] [address] [place], [place] [address]    Culture   Final    NO GROWTH 5 DAYS Performed at [facility], [address] [place], [place] [address]    Report Status 02/26/2022 FINAL  Final          Radiology Studies: US RENAL  Result Date: 02/26/2022 CLINICAL DATA:  Renal failure.  History of colon cancer.  CLL. EXAM: RENAL / URINARY TRACT ULTRASOUND COMPLETE COMPARISON:  CT abdomen and pelvis without contrast 02/19/2022 FINDINGS: Right Kidney: Renal measurements: 11.1 by 4.0 x 4.5 cm = volume: 103 mL. Echogenicity within normal limits. There are benign anechoic avascular simple cysts measuring up to 2.4 cm within the right upper pole and 2.0 cm within the right midpole. No hydronephrosis. Left Kidney: Renal measurements: 11.3 x 5.6 x 4.6 cm = volume: 15.5 mL. Echogenicity within normal limits. Left midpole anechoic avascular simple cyst measuring 1.9 cm. No hydronephrosis. Bladder: Appears normal for degree of bladder distention. Other: Mild free fluid is seen within the pelvis as on recent CT. IMPRESSION: 1. Bilateral benign simple renal cysts. 2. No hydronephrosis. 3. Mild free fluid within the pelvis as on recent CT. Electronically Signed   By: Yvonne Kendall M.D.   On: 02/26/2022 13:49        Scheduled Meds:  amLODipine  2.5 mg Per NG tube Daily   aspirin  81 mg Per Tube Daily   atorvastatin  40 mg Per Tube Daily   chlorhexidine  15 mL Mouth Rinse BID   feeding supplement (PROSource TF)  45 mL Per Tube BID   fiber  1 packet Per Tube TID    free water  150 mL Per Tube Q4H   guaiFENesin-dextromethorphan  5 mL Per Tube Q8H   heparin  5,000 Units Subcutaneous Q8H   insulin aspart  0-9 Units Subcutaneous Q4H   insulin aspart  6 Units Subcutaneous Q4H   insulin glargine-yfgn  15 Units Subcutaneous Daily   metoprolol tartrate  25 mg Per Tube BID   mirtazapine  7.5 mg Per Tube QHS   multivitamin with minerals  1 tablet Per Tube Daily   ondansetron  4 mg Per Tube Q8H   Or   ondansetron (ZOFRAN) IV  4 mg Intravenous Q8H   pantoprazole sodium  40 mg Per Tube BID   thiamine  100 mg Per Tube Daily   zinc sulfate  220 mg Per Tube Daily   Continuous Infusions:  sodium chloride Stopped (02/20/22 0850)   sodium chloride 125 mL/hr at 02/27/22  6629   ampicillin (OMNIPEN) IV 2 g (02/27/22 0236)   feeding supplement (OSMOLITE 1.5 CAL) 1,000 mL (02/26/22 1909)          Aline August, MD [facility] 02/27/2022, 8:18 AM

## 2022-02-27 NOTE — Consult Note (Addendum)
Renal Service Consult Note [facility]  Marquest Gunkel 02/27/2022 Sol Blazing, MD Requesting Physician: Dr. Starla Link  Reason for Consult: Renal failure HPI: The patient is a 81 y.o. year-old w/ hx of CLL, colon cancer (2009), DM2, HL, HTN and PNA who presented 5/25 w/ gen'd weakness and recurrent falls. Had been in hospital recently w/ similar presentation and was dc'd 7 days prior. Also had N/V, unable to eat solids. Profound weakness. In ED pt was tachy, weak. Afeb, room air, BP 480X systolic. Creat 1.7, LA 3.4, WBC 1.2, Hb 9.4.  Pt was admitted for FTT and dehydration.  Got 2 L bolus then IVF"s. Marinol started for poor appetite. CXR neg and UA neg, no abx were given. ACEi was held. WBC low due to CLL. Creat remained stable for 2-3 days , but since then has been increasing up into the mid 2s and today is up to 3.22. UOP stable at 500- 700 cc per day. We are asked to see for renal failure.   Pt seen in room, says he is followed by Dr Joylene Grapes at [facility] for several mos he thinks. No SOB or couhg, no change in UOP or color.   ROS - denies CP, no joint pain, no HA, no blurry vision, no rash, no diarrhea, no voiding difficulty   Past Medical History  Past Medical History:  Diagnosis Date   CLL (chronic lymphocytic leukemia) ([place])    chemotherapy   Colon cancer ([place])    Colorectal cancer ([place])    COLORECTAL DX 2/09, treated surgically   Diabetes mellitus    Hypercholesterolemia    Hypertension    Pneumonia    Bilateral pneumonia   Past Surgical History  Past Surgical History:  Procedure Laterality Date   HEMICOLECTOMY     nasal polyps     S/P nasal surgery   Family History  Family History  Problem Relation Age of Onset   Ovarian cancer Mother    Breast cancer Maternal Aunt    Social History  reports that he has quit smoking. His smoking use included cigarettes. He has a 28.00 pack-year smoking history. He has never used smokeless tobacco. He reports that he does  not drink alcohol and does not use drugs. Allergies  Allergies  Allergen Reactions   Hydrocodone-Acetaminophen Other (See Comments)    Loss of weight   Home medications Prior to Admission medications   Medication Sig Start Date End Date Taking? Authorizing Provider  amLODipine (NORVASC) 2.5 MG tablet Take 2.5 mg by mouth daily.   Yes [provider]  aspirin 81 MG tablet Take 81 mg by mouth every morning.    Yes [provider]  atorvastatin (LIPITOR) 40 MG tablet Take 40 mg by mouth daily. 01/10/13  Yes [provider]  famotidine (PEPCID) 40 MG tablet Take 40 mg by mouth at bedtime. 11/13/21  Yes [provider]  hydroxypropyl methylcellulose / hypromellose (ISOPTO TEARS / GONIOVISC) 2.5 % ophthalmic solution Place 1 drop into both eyes in the morning.   Yes [provider]  ibrutinib (IMBRUVICA) 420 MG tablet TAKE 1 TABLET BY MOUTH DAILY 02/11/22 02/11/23 Yes Shadad, Mathis Dad, MD  lisinopril (ZESTRIL) 20 MG tablet Take 20 mg by mouth every morning. 12/14/18  Yes [provider]  metFORMIN (GLUCOPHAGE) 1000 MG tablet Take 1,000 mg by mouth 2 (two) times daily with a meal. 06/05/17  Yes [provider]  metoprolol tartrate (LOPRESSOR) 25 MG tablet Take 12.5 mg by mouth 2 (  two) times daily.   Yes [provider]  ondansetron (ZOFRAN-ODT) 8 MG disintegrating tablet TAKE 1 TABLET BY MOUTH EVERY 8 HOURS AS NEEDED FOR NAUSEA AND VOMITING Patient taking differently: Take 8 mg by mouth every 8 (eight) hours as needed for nausea or vomiting. 02/02/22  Yes Wyatt Portela, MD  pantoprazole (PROTONIX) 40 MG tablet Take 1 tablet (40 mg total) by mouth 2 (two) times daily. Patient taking differently: Take 40 mg by mouth daily. 01/19/22  Yes Pyrtle, Lajuan Lines, MD  prochlorperazine (COMPAZINE) 10 MG tablet TAKE 1 TABLET BY MOUTH EVERY 6 HOURS AS NEEDED FOR NAUSEA OR VOMITING. 02/13/22  Yes Wyatt Portela, MD  zolpidem (AMBIEN) 10 MG tablet Take 5 mg  by mouth at bedtime. 10/31/19  Yes [provider]     Vitals:   02/26/22 1328 02/26/22 2022 02/27/22 0411 02/27/22 0421  BP: 113/67 (!) 136/56 (!) 133/55   Pulse: (!) 102  100   Resp: 16 20    Temp: 97.9 F (36.6 C) 98.9 F (37.2 C) 98.9 F (37.2 C)   TempSrc: Oral Oral Oral   SpO2: 100% 97% 100%   Weight:    65.7 kg  Height:       Exam Gen alert, no distress, chronically ill appearing No rash, cyanosis or gangrene Sclera anicteric, throat clear  No jvd or bruits Chest clear bilat to bases, dec'd R base RRR no MRG Abd soft ntnd no mass or ascites +bs GU normal male MS no joint effusions or deformity Ext diffuse 2-3+ bilat UE and LE edema Neuro is alert, Ox 3 , nf     Home meds include - norvasc 2.5, aspirin, lipitor, famotidine, ibrutinib, lisinopril 20, metformin, metoprolol 12.5 bid, ondanstron, pantoprazole, prochlorperazine, zolpidem       CT chest/ abd/ pelv 5/25 - Bulky upper abdominal lymphadenopathy, new. Progressive mediastinal and retroperitoneal lymphadenopathy. These are poorly evaluated but favor progressive lymphoma. Status post left hemicolectomy. No findings specific for recurrent or metastatic disease. Small bilateral pleural effusions, right greater than left. Small volume abdominopelvic ascites.     CXR 5/29 - IMPRESSION: Low lung volumes with bibasilar atelectasis. Dobbhoff feeding tube tip in proximal stomach.       Date   Creat  eGFR    2009- 2010  0.87- 1.26    2011- 2013  1.10- 1.20    2014- 2019  1.20- 1.38    2020   1.20- 1.66    2021   1.75- 2.30    Jan - jun 2022 1.40- 1.92    July - dec 2022 2.13- 2.18     12/02/21  2.55  25 ml/min     5/25   1.74  39 ml/min    5/27   1.53    5/29   2.06    5/31   2.61    6/01   3.11    6/02    3.22         WBC 2.9,  Hb 7.5  plt 91k     Alb 1.5, LFT"s okay, AG 5  CO2 20   BUN 86  Cr 3.22     UA  5/25 - 30 protein, 0-5 rbc/ wbc, >1000 glucose   Renal US 6/01 > 11- 12 cm kidneys, normal echo,  no hydro, simple cysts   CT abd w/o contrast 5/25 - Kidneys: bilateral renal cysts, measuring up to 2.2 cm in the right upper kidney (series 3/image 60). No hydronephrosis.  Bladder is within normal limits        I/O = + 17 L net since admitted        Wt's = up 14 kg since admission         B/l creatinine is 1.74- 2.55 from march 2023 and here, eGFR 25- 39    Assessment/ Plan: AKI on CKD 3b - b/l creatinine 1.74- 2.55 from march 2023 and this admit, eGFR 25- 39 ml/min.  Creat here 1.7 on admit 5/25, now rising up to 3.22 today in setting of SIRS type picture and FTT associated w/ enterococcus faecium bacteremia. Creat rising in spite of continued IVF administration and vol overload w/ diffuse pitting edema of the UE/ LE's. Up 17 L/ 14 kg. UA negative, no obstruction by CT. No contrast, minimal IV vanc, no acie/ ARB.  Last CXR neg on 5/29. BP's low to normal. Will try IV lasix 40 tid for vol overload. Will dc norvasc and lower metoprolol for now while giving IV diuretics. Poor prognosis overall. Pt is DNR.  Will follow.  E. Faecium bacteremia - getting IV ampicillin Neutropenia DM2 FTT CLL HTN - have lowered BP lowering meds   Rob Salmaan Patchin  MD 02/27/2022, 11:40 AM Recent Labs  Lab 02/24/22 0329 02/24/22 1723 02/25/22 0424 02/26/22 0420 02/27/22 0352  HGB 8.8*  --  8.7* 7.7* 7.5*  ALBUMIN  --   --  1.7*  --  1.5*  CALCIUM 7.1*  --  6.7* 6.7* 6.4*  PHOS 2.4* 1.6*  --   --   --   CREATININE 2.56*  --  2.61* 3.11* 3.22*  K 4.0  --  4.0 4.1 4.3

## 2022-02-27 NOTE — TOC Progression Note (Signed)
Transition of Care [facility]) - Progression Note    Patient Details  Name: Kainen Struckman MRN: [id number] Date of Birth: 03-08-1941  Transition of Care [facility]) CM/SW Contact  Leeroy Cha, RN Phone Number: 02/27/2022, 8:35 AM  Clinical Narrative:    Following for toc and home needs.   Expected Discharge Plan: Home/Self Care Barriers to Discharge: Continued Medical Work up  Expected Discharge Plan and Services Expected Discharge Plan: Home/Self Care   Discharge Planning Services: CM Consult   Living arrangements for the past 2 months: Single Family Home                                       Social Determinants of Health (SDOH) Interventions    Readmission Risk Interventions     View : No data to display.

## 2022-02-27 NOTE — Progress Notes (Signed)
Subjective: Afebrile Aki worsening Nephrology involved  Feels weak/the same On tube feed with diarrhea/rectal tube No other complaint  No rash   Discussed tte finding with cardiology -- chronic changes no valve erosion, normal valve functioning otherwise, no abnormal regurgitation     Antibiotics:  Anti-infectives (From admission, onward)    Start     Dose/Rate Route Frequency Ordered Stop   02/23/22 1900  ampicillin (OMNIPEN) 2 g in sodium chloride 0.9 % 100 mL IVPB        2 g 300 mL/hr over 20 Minutes Intravenous Every 8 hours 02/23/22 1059 03/07/22 2359   02/21/22 1000  ampicillin (OMNIPEN) 2 g in sodium chloride 0.9 % 100 mL IVPB  Status:  Discontinued        2 g 300 mL/hr over 20 Minutes Intravenous Every 6 hours 02/21/22 0940 02/23/22 1059   02/21/22 0300  ampicillin (OMNIPEN) 2 g in sodium chloride 0.9 % 100 mL IVPB  Status:  Discontinued        2 g 300 mL/hr over 20 Minutes Intravenous Every 8 hours 02/21/22 0215 02/21/22 0940   02/20/22 0815  vancomycin (VANCOREADY) IVPB 1250 mg/250 mL  Status:  Discontinued        1,250 mg 166.7 mL/hr over 90 Minutes Intravenous Every 48 hours 02/20/22 0715 02/21/22 0215   02/20/22 0800  ceFEPIme (MAXIPIME) 2 g in sodium chloride 0.9 % 100 mL IVPB  Status:  Discontinued        2 g 200 mL/hr over 30 Minutes Intravenous Every 24 hours 02/20/22 0643 02/21/22 0215   02/20/22 0800  vancomycin (VANCOREADY) IVPB 1750 mg/350 mL  Status:  Discontinued        1,750 mg 175 mL/hr over 120 Minutes Intravenous Every 48 hours 02/20/22 0703 02/20/22 0715       Medications: Scheduled Meds:  aspirin  81 mg Per Tube Daily   atorvastatin  40 mg Per Tube Daily   chlorhexidine  15 mL Mouth Rinse BID   feeding supplement (PROSource TF)  45 mL Per Tube BID   fiber  1 packet Per Tube TID   free water  150 mL Per Tube Q4H   furosemide  40 mg Intravenous Q8H   guaiFENesin-dextromethorphan  5 mL Per Tube Q8H   heparin  5,000 Units  Subcutaneous Q8H   insulin aspart  0-9 Units Subcutaneous Q4H   insulin aspart  6 Units Subcutaneous Q4H   insulin glargine-yfgn  15 Units Subcutaneous Daily   metoprolol tartrate  12.5 mg Per Tube BID   mirtazapine  7.5 mg Per Tube QHS   multivitamin with minerals  1 tablet Per Tube Daily   ondansetron  4 mg Per Tube Q8H   Or   ondansetron (ZOFRAN) IV  4 mg Intravenous Q8H   pantoprazole sodium  40 mg Per Tube BID   thiamine  100 mg Per Tube Daily   zinc sulfate  220 mg Per Tube Daily   Continuous Infusions:  sodium chloride Stopped (02/20/22 0850)   ampicillin (OMNIPEN) IV 2 g (02/27/22 1046)   feeding supplement (OSMOLITE 1.5 CAL) 1,000 mL (02/26/22 1909)   PRN Meds:.sodium chloride, acetaminophen, labetalol, liver oil-zinc oxide, loperamide HCl, prochlorperazine    Objective: Weight change: 4.3 kg  Intake/Output Summary (Last 24 hours) at 02/27/2022 1701 Last data filed at 02/27/2022 1616 Gross per 24 hour  Intake 550 ml  Output 500 ml  Net 50 ml   Blood pressure Marland Kitchen)  117/55, pulse 91, temperature 98 F (36.7 C), temperature source Oral, resp. rate 16, height '5\' 7"'$  (1.702 m), weight 65.7 kg, SpO2 100 %. Temp:  [98 F (36.7 C)-98.9 F (37.2 C)] 98 F (36.7 C) (06/02 1309) Pulse Rate:  [91-100] 91 (06/02 1309) Resp:  [16-20] 16 (06/02 1309) BP: (117-136)/(55-56) 117/55 (06/02 1309) SpO2:  [97 %-100 %] 100 % (06/02 1309) Weight:  [65.7 kg] 65.7 kg (06/02 0421)  Physical Exam: General/constitutional: no distress, pleasant, chronically ill appearing; tf in nose HEENT: Normocephalic, PER, Conj Clear, EOMI, Oropharynx clear Neck supple CV: rrr no mrg Lungs: clear to auscultation, normal respiratory effort Abd: Soft, Nontender Ext: anasarca Skin: No Rash Neuro: nonfocal -- generalized weakness Gu: rectal tube in place MSK: no peripheral joint swelling/tenderness/warmth; back spines nontender        CBC: Lab Results  Component Value Date   WBC 2.9 (L)  02/27/2022   HGB 7.5 (L) 02/27/2022   HCT 24.4 (L) 02/27/2022   MCV 85.6 02/27/2022   PLT 91 (L) 02/27/2022      BMET Recent Labs    02/26/22 0420 02/27/22 0352  NA 139 138  K 4.1 4.3  CL 112* 113*  CO2 21* 20*  GLUCOSE 239* 179*  BUN 83* 86*  CREATININE 3.11* 3.22*  CALCIUM 6.7* 6.4*     Liver Panel  Recent Labs    02/25/22 0424 02/27/22 0352  PROT 4.0* 3.7*  ALBUMIN 1.7* 1.5*  AST 30 23  ALT 27 26  ALKPHOS 106 109  BILITOT 0.5 0.6       Sedimentation Rate No results for input(s): ESRSEDRATE in the last 72 hours. C-Reactive Protein No results for input(s): CRP in the last 72 hours.  Micro Results: Recent Results (from the past 720 hour(s))  Culture, blood (Routine x 2)     Status: None   Collection Time: 02/09/22 10:23 AM   Specimen: Right Antecubital; Blood  Result Value Ref Range Status   Specimen Description   Final    RIGHT ANTECUBITAL Performed at [facility], [address] [place], [place] [address]    Special Requests   Final    BOTTLES DRAWN AEROBIC AND ANAEROBIC Blood Culture adequate volume Performed at [facility], [address], [place], [place] [address]    Culture   Final    NO GROWTH 5 DAYS Performed at [facility], [place] [address] [place], [place] [address]    Report Status 02/14/2022 FINAL  Final  Culture, blood (Routine x 2)     Status: None   Collection Time: 02/09/22 10:23 AM   Specimen: BLOOD LEFT FOREARM  Result Value Ref Range Status   Specimen Description   Final    BLOOD LEFT FOREARM Performed at [facility], [address] [place], [place] [address]    Special Requests   Final    BOTTLES DRAWN AEROBIC AND ANAEROBIC Blood Culture adequate volume Performed at [facility], [address] [place], [place] [address]    Culture   Final    NO GROWTH 5 DAYS Performed at [facility], [place] [address], [place],  [place] [address]    Report Status 02/14/2022 FINAL  Final  Urine Culture     Status: None   Collection Time: 02/09/22 10:23 AM   Specimen: Urine, Clean Catch  Result Value Ref Range Status   Specimen Description   Final    URINE, CLEAN CATCH Performed at [facility], [address], [place], [place] [address]    Special Requests   Final    NONE Performed at [facility], [address] [place], [place] [address]    Culture   Final    NO GROWTH Performed at [facility], [place] [address], [place], [place] [address]    Report Status 02/10/2022 FINAL  Final  MRSA Next Gen by PCR, Nasal     Status: None   Collection Time: 02/09/22  2:39 PM   Specimen: Nasal Mucosa; Nasal Swab  Result Value Ref Range Status   MRSA by PCR Next Gen NOT DETECTED NOT DETECTED Final    Comment: (NOTE) The GeneXpert MRSA Assay (FDA approved for NASAL specimens only), is one component of a comprehensive MRSA colonization surveillance program. It is not intended to diagnose MRSA infection nor to guide or monitor treatment for MRSA infections. Test performance is not FDA approved in patients less than 19 years old. Performed at [facility], [place] [address], [place], [place] [address]   Culture, blood (Routine X 2) w Reflex to ID Panel     Status: Abnormal   Collection Time: 02/20/22  7:50 AM   Specimen: BLOOD  Result Value Ref Range Status   Specimen Description BLOOD LEFT ANTECUBITAL  Final   Special Requests   Final    BOTTLES DRAWN AEROBIC AND ANAEROBIC Blood Culture adequate volume   Culture  Setup Time   Final    GRAM POSITIVE COCCI IN BOTH AEROBIC AND ANAEROBIC BOTTLES CRITICAL RESULT CALLED TO, READ BACK BY AND VERIFIED WITH: E JACKSON,PHARMD'@0200'$  02/21/22 [place]    Culture ENTEROCOCCUS FAECIUM (A)  Final   Report Status 02/22/2022 FINAL  Final   Organism ID, Bacteria ENTEROCOCCUS FAECIUM  Final      Susceptibility   Enterococcus faecium  - MIC*    AMPICILLIN <=2 SENSITIVE Sensitive     VANCOMYCIN <=0.5 SENSITIVE Sensitive     GENTAMICIN SYNERGY SENSITIVE Sensitive     LINEZOLID Value in next row Sensitive      SENSITIVE2Performed at [facility] [address], [place],  [address]    * ENTEROCOCCUS FAECIUM  Blood Culture ID Panel (Reflexed)     Status: Abnormal   Collection Time: 02/20/22  7:50 AM  Result Value Ref Range Status   Enterococcus faecalis NOT DETECTED NOT DETECTED Final   Enterococcus Faecium DETECTED (A) NOT DETECTED Final    Comment: CRITICAL RESULT CALLED TO, READ BACK BY AND VERIFIED WITH: E JACKSON,PHARMD'@0200'$  02/21/22 [place]    Listeria monocytogenes NOT DETECTED NOT DETECTED Final   Staphylococcus species NOT DETECTED NOT DETECTED Final   Staphylococcus aureus (BCID) NOT DETECTED NOT DETECTED Final   Staphylococcus epidermidis NOT DETECTED NOT DETECTED Final   Staphylococcus lugdunensis NOT DETECTED NOT DETECTED Final   Streptococcus species NOT DETECTED NOT DETECTED Final   Streptococcus agalactiae NOT DETECTED NOT DETECTED Final   Streptococcus pneumoniae NOT DETECTED NOT DETECTED Final   Streptococcus pyogenes NOT DETECTED NOT DETECTED Final   A.calcoaceticus-baumannii NOT DETECTED NOT DETECTED Final   Bacteroides fragilis NOT DETECTED NOT DETECTED Final   Enterobacterales NOT DETECTED NOT DETECTED Final   Enterobacter cloacae complex NOT DETECTED NOT DETECTED Final   Escherichia coli NOT DETECTED NOT DETECTED Final   Klebsiella aerogenes NOT DETECTED NOT DETECTED Final   Klebsiella oxytoca NOT DETECTED NOT DETECTED Final   Klebsiella pneumoniae NOT DETECTED NOT DETECTED Final   Proteus species NOT DETECTED NOT DETECTED Final  Salmonella species NOT DETECTED NOT DETECTED Final   Serratia marcescens NOT DETECTED NOT DETECTED Final   Haemophilus influenzae NOT DETECTED NOT DETECTED Final   Neisseria meningitidis NOT DETECTED NOT DETECTED Final   Pseudomonas aeruginosa NOT DETECTED  NOT DETECTED Final   Stenotrophomonas maltophilia NOT DETECTED NOT DETECTED Final   Candida albicans NOT DETECTED NOT DETECTED Final   Candida auris NOT DETECTED NOT DETECTED Final   Candida glabrata NOT DETECTED NOT DETECTED Final   Candida krusei NOT DETECTED NOT DETECTED Final   Candida parapsilosis NOT DETECTED NOT DETECTED Final   Candida tropicalis NOT DETECTED NOT DETECTED Final   Cryptococcus neoformans/gattii NOT DETECTED NOT DETECTED Final   Vancomycin resistance NOT DETECTED NOT DETECTED Final    Comment: Performed at [facility], [address] [place], [place] [address]  Culture, blood (Routine X 2) w Reflex to ID Panel     Status: Abnormal   Collection Time: 02/20/22  7:54 AM   Specimen: BLOOD  Result Value Ref Range Status   Specimen Description   Final    BLOOD BLOOD RIGHT FOREARM Performed at [facility] [address], [place], [place] [address]    Special Requests   Final    BOTTLES DRAWN AEROBIC AND ANAEROBIC Blood Culture adequate volume Performed at [facility] [address], [place], [place] [address]    Culture  Setup Time   Final    GRAM POSITIVE COCCI IN BOTH AEROBIC AND ANAEROBIC BOTTLES CRITICAL VALUE NOTED.  VALUE IS CONSISTENT WITH PREVIOUSLY REPORTED AND CALLED VALUE.    Culture (A)  Final    ENTEROCOCCUS FAECIUM SUSCEPTIBILITIES PERFORMED ON PREVIOUS CULTURE WITHIN THE LAST 5 DAYS. Performed at [facility], [place] [address] [place], [place] [address]    Report Status 02/23/2022 FINAL  Final  Culture, blood (Routine X 2) w Reflex to ID Panel     Status: None   Collection Time: 02/21/22  6:32 AM   Specimen: BLOOD  Result Value Ref Range Status   Specimen Description   Final    BLOOD BLOOD RIGHT FOREARM Performed at [facility] [address] [place], [place] [address]    Special Requests   Final    BOTTLES DRAWN AEROBIC ONLY Blood Culture adequate volume Performed  at [facility] [address], [place], [place] [address]    Culture   Final    NO GROWTH 5 DAYS Performed at [facility], [place] [address], [place], [place] [address]    Report Status 02/26/2022 FINAL  Final  Culture, blood (Routine X 2) w Reflex to ID Panel     Status: None   Collection Time: 02/21/22  6:32 AM   Specimen: BLOOD  Result Value Ref Range Status   Specimen Description   Final    BLOOD BLOOD RIGHT HAND Performed at [facility] [address] [place], [place] [address]    Special Requests   Final    IN PEDIATRIC BOTTLE Blood Culture results may not be optimal due to an inadequate volume of blood received in culture bottles Performed at [facility] [address], [place], [place] [address]    Culture   Final    NO GROWTH 5 DAYS Performed at [facility], [place] [address], [place],  [address]    Report Status 02/26/2022 FINAL  Final    Studies/Results: Reviewed/incorporated into medical decision making  US RENAL  Result Date: 02/26/2022 CLINICAL DATA:  Renal failure.  History of colon cancer.  CLL. EXAM: RENAL / URINARY TRACT ULTRASOUND COMPLETE COMPARISON:  CT abdomen and pelvis without contrast 02/19/2022 FINDINGS: Right Kidney: Renal measurements: 11.1 by 4.0 x 4.5 cm = volume: 103 mL. Echogenicity within normal limits. There are benign anechoic avascular simple cysts measuring up to 2.4 cm within the right upper pole and 2.0 cm within the right midpole. No hydronephrosis. Left Kidney: Renal measurements: 11.3 x 5.6 x 4.6 cm = volume: 15.5 mL. Echogenicity within normal limits. Left midpole anechoic avascular simple cyst measuring 1.9 cm. No hydronephrosis. Bladder: Appears normal for degree of bladder distention. Other: Mild free fluid is seen within the pelvis as on recent CT. IMPRESSION: 1. Bilateral benign simple renal cysts. 2. No hydronephrosis. 3. Mild free fluid within the pelvis as on  recent CT. Electronically Signed   By: Yvonne Kendall M.D.   On: 02/26/2022 13:49    5/27 tte  1. Strain abnormal -11.5 but poor tracking not reported EF is  hyperdynamic.   2. Left ventricular ejection fraction, by estimation, is 60 to 65%. The  left ventricle has normal function. The left ventricle has no regional  wall motion abnormalities. Left ventricular diastolic parameters were  normal.   3. Right ventricular systolic function is normal. The right ventricular  size is normal.   4. The pericardial effusion is posterior to the left ventricle and  anterior to the right ventricle.   5. The mitral valve is normal in structure. No evidence of mitral valve  regurgitation. No evidence of mitral stenosis.   6. Nodular calcification of right coronary cusp likely sclerosis If  suspicion of SBE high consider TEE. The aortic valve is normal in  structure. There is moderate calcification of the aortic valve. There is  moderate thickening of the aortic valve.  Aortic valve regurgitation is not visualized. Aortic valve  sclerosis/calcification is present, without any evidence of aortic  stenosis.   7. The inferior vena cava is normal in size with greater than 50%  respiratory variability, suggesting right atrial pressure of 3 mmHg.     Assessment/Plan:  INTERVAL HISTORY: Blood cultures clearing  Principal Problem:   Bacteremia due to Enterococcus Active Problems:   Chronic lymphocytic leukemia (HCC)   Colon cancer (HCC)   Hypertension   Hypercholesterolemia   Diabetes mellitus (HCC)   Sepsis ([place])   Neutropenia in patient with hx of CLL   Protein-calorie malnutrition, severe (HCC)   AKI (acute kidney injury) ([place])   Dehydration   Hyponatremia   Stage 3b chronic kidney disease (CKD) ([place])   Neutropenic fever (HCC)   Refeeding syndrome   Hypophosphatemia   CLL (chronic lymphocytic leukemia) ([place])   Failure to thrive in adult    [name] is a 81 y.o. male with  CLL  with recurrence in context of coming off and on Imbruvaca with neutropenia admitted 5/25 with sepsis in setting of AMP S E faecium bacteremia  5/26 bcx high burden bacteremia e faecium 5/27 tte showing av sclerosis/calcification but no valve destruction (some effusion as well). Doesn't act like tamponade  Denova score high in setting malignancy, duration of sickness (I wouldn't be able to tell how long he has been sick for), agree need to r/o endocarditis  Tte calcification of av I discussed with patient extensively previously 1) e faecium is much less likely than e faecalis in terms of causing IE 2) bacteremia had cleared quickly  with just ampicillin 3) tte sclerosis and appeared age related without concerning sign of IE although can't r/o. I spoke with cardiology as well 4) I offered him 3 approaches A) treat with amp or appropriate oral abx (sensitive to linezolid mic 2) for 2 weeks and repeat tte and surveillant blood cx B) perform tee which in his setting with pancytopenia higher risk for esophageal rupture/bleeding. And him having dysphagia with sedation could have aspiration as well C) commit to treating as IE without TEE, which is too high of risk (aki will be worsened potentially irreversible aki with aminoglycoside combination)  He opted for approach A  ---------- 6/02 assessment Aki still worsening Clinically feels same dysphagia and tired/malaise/generalized weakness  No rash or other sign to suggest ampicillin allergy contributing to aki (lft normal; no eosinophilia)  5/27 repeat bcx negative    -aki management per primary team/nephrology -continue ampicillin while in house-- finish 2 weeks from 5/27 (on 6/10); if discharge earlier can transition to linezolid 600 mg po bid to finish the course -repeat blood cx (1 week off abx) -will defer tee for now -will sign off -- if patient still here by the time of his abx finishing, please call ID to reevaluate; otherwise his  follow up appointment is on 7/6 @ 245 at [facility] with me   [facility] [place], [place], [place] [address] Phone: [phone]  -discussed with primary team      LOS: 7 days   Rhylan Kagel T Adlyn Fife 02/27/2022, 5:01 PM

## 2022-02-28 ENCOUNTER — Inpatient Hospital Stay (HOSPITAL_COMMUNITY): Payer: Medicare Other

## 2022-02-28 LAB — CBC WITH DIFFERENTIAL/PLATELET
Abs Immature Granulocytes: 0.21 10*3/uL — ABNORMAL HIGH (ref 0.00–0.07)
Basophils Absolute: 0 10*3/uL (ref 0.0–0.1)
Basophils Relative: 0 %
Eosinophils Absolute: 0 10*3/uL (ref 0.0–0.5)
Eosinophils Relative: 1 %
HCT: 26.4 % — ABNORMAL LOW (ref 39.0–52.0)
Hemoglobin: 8 g/dL — ABNORMAL LOW (ref 13.0–17.0)
Immature Granulocytes: 7 %
Lymphocytes Relative: 34 %
Lymphs Abs: 1.1 10*3/uL (ref 0.7–4.0)
MCH: 26.8 pg (ref 26.0–34.0)
MCHC: 30.3 g/dL (ref 30.0–36.0)
MCV: 88.6 fL (ref 80.0–100.0)
Monocytes Absolute: 0.2 10*3/uL (ref 0.1–1.0)
Monocytes Relative: 5 %
Neutro Abs: 1.6 10*3/uL — ABNORMAL LOW (ref 1.7–7.7)
Neutrophils Relative %: 53 %
Platelets: 102 10*3/uL — ABNORMAL LOW (ref 150–400)
RBC: 2.98 MIL/uL — ABNORMAL LOW (ref 4.22–5.81)
RDW: 18.6 % — ABNORMAL HIGH (ref 11.5–15.5)
WBC: 3.1 10*3/uL — ABNORMAL LOW (ref 4.0–10.5)
nRBC: 0 % (ref 0.0–0.2)

## 2022-02-28 LAB — BASIC METABOLIC PANEL
Anion gap: 6 (ref 5–15)
BUN: 88 mg/dL — ABNORMAL HIGH (ref 8–23)
CO2: 19 mmol/L — ABNORMAL LOW (ref 22–32)
Calcium: 6.7 mg/dL — ABNORMAL LOW (ref 8.9–10.3)
Chloride: 113 mmol/L — ABNORMAL HIGH (ref 98–111)
Creatinine, Ser: 3.59 mg/dL — ABNORMAL HIGH (ref 0.61–1.24)
GFR, Estimated: 17 mL/min — ABNORMAL LOW (ref >60)
Glucose, Bld: 214 mg/dL — ABNORMAL HIGH (ref 70–99)
Potassium: 4.6 mmol/L (ref 3.5–5.1)
Sodium: 138 mmol/L (ref 135–145)

## 2022-02-28 LAB — GLUCOSE, CAPILLARY
Glucose-Capillary: 165 mg/dL — ABNORMAL HIGH (ref 70–99)
Glucose-Capillary: 181 mg/dL — ABNORMAL HIGH (ref 70–99)
Glucose-Capillary: 207 mg/dL — ABNORMAL HIGH (ref 70–99)
Glucose-Capillary: 73 mg/dL (ref 70–99)
Glucose-Capillary: 82 mg/dL (ref 70–99)
Glucose-Capillary: 83 mg/dL (ref 70–99)
Glucose-Capillary: 85 mg/dL (ref 70–99)

## 2022-02-28 LAB — MAGNESIUM: Magnesium: 2.1 mg/dL (ref 1.7–2.4)

## 2022-02-28 MED ORDER — ALBUMIN HUMAN 25 % IV SOLN
25.0000 g | Freq: Two times a day (BID) | INTRAVENOUS | Status: AC
  Administered 2022-02-28 – 2022-03-02 (×6): 25 g via INTRAVENOUS
  Filled 2022-02-28 (×6): qty 100

## 2022-02-28 NOTE — Progress Notes (Signed)
PROGRESS NOTE    [name]  WSF:[id number] DOB: 1941-06-16 DOA: 02/19/2022 PCP: [name], MD   Brief Narrative:  81 year old male with history of CLL initially diagnosed in 2014, status postchemotherapy and currently on Imbruvica, DM 2, HTN, colorectal cancer 2009, who came into the hospital with progressive and generalized weakness.  Patient has been experiencing some weight loss and poor appetite for the past several months, was seen by GI in February as an outpatient and underwent work-up without any significant major findings.  He was taken off Imbruvica in April as it was believed that it may be related to that, however he continued to decline.  He was hospitalized just 2 weeks ago for dehydration, and when he left the hospital he was placed back on his Imbruvica.  While at home he continued to decline and was rehospitalized on 5/25.  Repeat CT scan of the abdomen and pelvis showed recurrence of the CLL, he was also found to be neutropenic, have a fever and have Enterococcus bacteremia.  Oncology and ID consulted.  Palliative care has also been consulted for goals of care discussion.  Nephrology was consulted on 02/27/2022 for worsening creatinine.  Assessment & Plan:   Sepsis: Present on admission Enterococcus faecium bacteremia Neutropenic fever -2D echo showed normal EF of 60 to 65%, small pericardial effusion,  there was also a nodular calcification in the right coronary cusp likely sclerosis but if high suspicion for SBE, consider TEE.  ID following and recommending TEE.  Cardiology was the opinion that patient is high risk for TEE.  Patient has opted to not proceed with TEE.  ID recommends to continue ampicillin 2 weeks from 02/21/2022 (end date 03/07/2022): If discharged earlier, can transition to Zyvox to finish the course repeat TTE in around 2 weeks.  Outpatient follow-up with ID -currently on ampicillin.  No temperature spikes over the last 24 hours.  Blood pressure  stable. -Repeat blood cultures from 02/21/2022 are negative so far  Goals of care Failure to thrive Dehydration/dysphagia/chronic cough CLL Physical deconditioning -overall difficult situation and complex case given progression of his CLL with progressive lymphadenopathy and now complicated by persistent neutropenia and Enterococcus bacteremia with concern for infectious endocarditis.   -Palliative care following -Oncology following -Currently has Cortrak feeding.  He has not made up his mind about PEG tube placement/feeding. SLP still recommends n.p.o. except ice chips.  SLP will reevaluate the patient on Monday with MBS -Dietitian following  Acute kidney injury on CKD stage IIIb -Baseline creatinine 1.4-1.6.  Worsening to 3.59 today.   -Creatinine continues to worsen despite IV fluids.  IV fluids discontinued on 02/27/2022 and patient has been started on IV Lasix because of volume overload -Renal ultrasound negative for hydronephrosis.  Repeat a.m. labs.  Nephrology following and managing diuretics.  Follow recommendations.  Diarrhea -Possibly from tube feeds and antibiotic use.  Imodium as needed.  Patient currently has a rectal tube.  Pancytopenia -Due to malignancy.  Patient received a few doses of Granix as per oncology recommendations for neutropenic fever.  Monitor  Hyperlipidemia -Continue statin  Essential hypertension  -continue metoprolol and Lasix.  Amlodipine discontinued on 02/27/2022 by nephrology  Hyperlipidemia -Continue statin  Diabetes mellitus type 2 with hyperglycemia -Continue long-acting insulin.  Continue NovoLog and SSI  Severe protein calorie malnutrition/hypoalbuminemia -Dietitian following for tube feedings   DVT prophylaxis: Heparin subcutaneous Code Status: Full Family Communication:  None at bedside Disposition Plan: Status is: Inpatient Remains inpatient appropriate because: Of severity of illness  Consultants: ID/oncology/palliative  care/nephrology  Procedures: 2D echo  Antimicrobials:  Anti-infectives (From admission, onward)    Start     Dose/Rate Route Frequency Ordered Stop   02/23/22 1900  ampicillin (OMNIPEN) 2 g in sodium chloride 0.9 % 100 mL IVPB        2 g 300 mL/hr over 20 Minutes Intravenous Every 8 hours 02/23/22 1059 03/07/22 2359   02/21/22 1000  ampicillin (OMNIPEN) 2 g in sodium chloride 0.9 % 100 mL IVPB  Status:  Discontinued        2 g 300 mL/hr over 20 Minutes Intravenous Every 6 hours 02/21/22 0940 02/23/22 1059   02/21/22 0300  ampicillin (OMNIPEN) 2 g in sodium chloride 0.9 % 100 mL IVPB  Status:  Discontinued        2 g 300 mL/hr over 20 Minutes Intravenous Every 8 hours 02/21/22 0215 02/21/22 0940   02/20/22 0815  vancomycin (VANCOREADY) IVPB 1250 mg/250 mL  Status:  Discontinued        1,250 mg 166.7 mL/hr over 90 Minutes Intravenous Every 48 hours 02/20/22 0715 02/21/22 0215   02/20/22 0800  ceFEPIme (MAXIPIME) 2 g in sodium chloride 0.9 % 100 mL IVPB  Status:  Discontinued        2 g 200 mL/hr over 30 Minutes Intravenous Every 24 hours 02/20/22 0643 02/21/22 0215   02/20/22 0800  vancomycin (VANCOREADY) IVPB 1750 mg/350 mL  Status:  Discontinued        1,750 mg 175 mL/hr over 120 Minutes Intravenous Every 48 hours 02/20/22 0703 02/20/22 0715        Subjective: Patient seen and examined at bedside.  Poor historian.  No fever, vomiting, seizures or agitation reported.    objective: Vitals:   02/27/22 0421 02/27/22 1309 02/27/22 2041 02/28/22 0338  BP:  (!) 117/55 (!) 129/49 (!) 129/46  Pulse:  91 99 100  Resp:  '16 16 20  '$ Temp:  98 F (36.7 C) 98.6 F (37 C) 98.1 F (36.7 C)  TempSrc:  Oral Oral Oral  SpO2:  100% 99% 100%  Weight: 65.7 kg   65 kg  Height:        Intake/Output Summary (Last 24 hours) at 02/28/2022 0746 Last data filed at 02/28/2022 0402 Gross per 24 hour  Intake 1803.87 ml  Output 825 ml  Net 978.87 ml    Filed Weights   02/25/22 1158 02/27/22 0421  02/28/22 0338  Weight: 61.4 kg 65.7 kg 65 kg    Examination:  General: No distress.  Currently still on room air chronically ill and deconditioned looking. ENT/neck: NG tube with tube feeding present.  No neck masses palpated  respiratory: Bilateral decreased breath sounds at bases with basilar crackles  CVS: S1-S2 heard; currently rate controlled  abdominal: Soft, nontender, still distended; no organomegaly, bowel sounds are heard  extremities: No clubbing; mild pitting lower extremity edema present bilaterally  CNS: Very poor historian; awake.  Extremely slow to respond.  No focal neurologic deficit.  Moving extremities  lymph: No palpable cervical lymphadenopathy  skin: No obvious ecchymosis/lesions  psych: Not agitated currently.  Affect is extremely flat  musculoskeletal: No obvious joint swelling/deformity    Data Reviewed: I have personally reviewed following labs and imaging studies  CBC: Recent Labs  Lab 02/24/22 0329 02/25/22 0424 02/26/22 0420 02/27/22 0352 02/28/22 0439  WBC 2.4* 1.6* 2.3* 2.9* 3.1*  NEUTROABS 0.8* 0.3* 1.4* 1.7 1.6*  HGB 8.8* 8.7* 7.7* 7.5* 8.0*  HCT 29.0* 29.0*  25.0* 24.4* 26.4*  MCV 88.7 88.4 86.5 85.6 88.6  PLT 120* 87* 91* 91* 102*    Basic Metabolic Panel: Recent Labs  Lab 02/22/22 1645 02/23/22 0542 02/23/22 1722 02/24/22 0329 02/24/22 1723 02/25/22 0424 02/26/22 0420 02/27/22 0352 02/28/22 0439  NA  --  134*  --  138  --  139 139 138 138  K  --  3.8  --  4.0  --  4.0 4.1 4.3 4.6  CL  --  104  --  108  --  110 112* 113* 113*  CO2  --  23  --  21*  --  19* 21* 20* 19*  GLUCOSE  --  353*  --  304*  --  221* 239* 179* 214*  BUN  --  48*  --  61*  --  67* 83* 86* 88*  CREATININE  --  2.06*  --  2.56*  --  2.61* 3.11* 3.22* 3.59*  CALCIUM  --  7.2*  --  7.1*  --  6.7* 6.7* 6.4* 6.7*  MG 1.8 1.9 1.9 2.0 1.9 1.9 1.9 2.0 2.1  PHOS 3.6 3.4 2.7 2.4* 1.6*  --   --   --   --     GFR: Estimated Creatinine Clearance: 15.3 mL/min (A)  (by C-G formula based on SCr of 3.59 mg/dL (H)). Liver Function Tests: Recent Labs  Lab 02/22/22 0637 02/23/22 0542 02/25/22 0424 02/27/22 0352  AST 32 '29 30 23  '$ ALT '29 31 27 26  '$ ALKPHOS 82 79 106 109  BILITOT 0.6 0.6 0.5 0.6  PROT 4.4* 4.2* 4.0* 3.7*  ALBUMIN 1.9* 2.0* 1.7* 1.5*    No results for input(s): LIPASE, AMYLASE in the last 168 hours. No results for input(s): AMMONIA in the last 168 hours. Coagulation Profile: No results for input(s): INR, PROTIME in the last 168 hours. Cardiac Enzymes: No results for input(s): CKTOTAL, CKMB, CKMBINDEX, TROPONINI in the last 168 hours. BNP (last 3 results) No results for input(s): PROBNP in the last 8760 hours. HbA1C: No results for input(s): HGBA1C in the last 72 hours. CBG: Recent Labs  Lab 02/27/22 1155 02/27/22 1606 02/27/22 1952 02/27/22 2325 02/28/22 0335  GLUCAP 201* 146* 124* 142* 165*    Lipid Profile: No results for input(s): CHOL, HDL, LDLCALC, TRIG, CHOLHDL, LDLDIRECT in the last 72 hours. Thyroid Function Tests: No results for input(s): TSH, T4TOTAL, FREET4, T3FREE, THYROIDAB in the last 72 hours. Anemia Panel: No results for input(s): VITAMINB12, FOLATE, FERRITIN, TIBC, IRON, RETICCTPCT in the last 72 hours. Sepsis Labs: No results for input(s): PROCALCITON, LATICACIDVEN in the last 168 hours.   Recent Results (from the past 240 hour(s))  Culture, blood (Routine X 2) w Reflex to ID Panel     Status: Abnormal   Collection Time: 02/20/22  7:50 AM   Specimen: BLOOD  Result Value Ref Range Status   Specimen Description BLOOD LEFT ANTECUBITAL  Final   Special Requests   Final    BOTTLES DRAWN AEROBIC AND ANAEROBIC Blood Culture adequate volume   Culture  Setup Time   Final    GRAM POSITIVE COCCI IN BOTH AEROBIC AND ANAEROBIC BOTTLES CRITICAL RESULT CALLED TO, READ BACK BY AND VERIFIED WITH: E JACKSON,PHARMD'@0200'$  02/21/22 [place]    Culture ENTEROCOCCUS FAECIUM (A)  Final   Report Status 02/22/2022 FINAL   Final   Organism ID, Bacteria ENTEROCOCCUS FAECIUM  Final      Susceptibility   Enterococcus faecium - MIC*    AMPICILLIN <=2  SENSITIVE Sensitive     VANCOMYCIN <=0.5 SENSITIVE Sensitive     GENTAMICIN SYNERGY SENSITIVE Sensitive     LINEZOLID Value in next row Sensitive      SENSITIVE2Performed at [facility] [address] [place], [place] [address]    * ENTEROCOCCUS FAECIUM  Blood Culture ID Panel (Reflexed)     Status: Abnormal   Collection Time: 02/20/22  7:50 AM  Result Value Ref Range Status   Enterococcus faecalis NOT DETECTED NOT DETECTED Final   Enterococcus Faecium DETECTED (A) NOT DETECTED Final    Comment: CRITICAL RESULT CALLED TO, READ BACK BY AND VERIFIED WITH: E JACKSON,PHARMD'@0200'$  02/21/22 [place]    Listeria monocytogenes NOT DETECTED NOT DETECTED Final   Staphylococcus species NOT DETECTED NOT DETECTED Final   Staphylococcus aureus (BCID) NOT DETECTED NOT DETECTED Final   Staphylococcus epidermidis NOT DETECTED NOT DETECTED Final   Staphylococcus lugdunensis NOT DETECTED NOT DETECTED Final   Streptococcus species NOT DETECTED NOT DETECTED Final   Streptococcus agalactiae NOT DETECTED NOT DETECTED Final   Streptococcus pneumoniae NOT DETECTED NOT DETECTED Final   Streptococcus pyogenes NOT DETECTED NOT DETECTED Final   A.calcoaceticus-baumannii NOT DETECTED NOT DETECTED Final   Bacteroides fragilis NOT DETECTED NOT DETECTED Final   Enterobacterales NOT DETECTED NOT DETECTED Final   Enterobacter cloacae complex NOT DETECTED NOT DETECTED Final   Escherichia coli NOT DETECTED NOT DETECTED Final   Klebsiella aerogenes NOT DETECTED NOT DETECTED Final   Klebsiella oxytoca NOT DETECTED NOT DETECTED Final   Klebsiella pneumoniae NOT DETECTED NOT DETECTED Final   Proteus species NOT DETECTED NOT DETECTED Final   Salmonella species NOT DETECTED NOT DETECTED Final   Serratia marcescens NOT DETECTED NOT DETECTED Final   Haemophilus influenzae NOT DETECTED NOT DETECTED  Final   Neisseria meningitidis NOT DETECTED NOT DETECTED Final   Pseudomonas aeruginosa NOT DETECTED NOT DETECTED Final   Stenotrophomonas maltophilia NOT DETECTED NOT DETECTED Final   Candida albicans NOT DETECTED NOT DETECTED Final   Candida auris NOT DETECTED NOT DETECTED Final   Candida glabrata NOT DETECTED NOT DETECTED Final   Candida krusei NOT DETECTED NOT DETECTED Final   Candida parapsilosis NOT DETECTED NOT DETECTED Final   Candida tropicalis NOT DETECTED NOT DETECTED Final   Cryptococcus neoformans/gattii NOT DETECTED NOT DETECTED Final   Vancomycin resistance NOT DETECTED NOT DETECTED Final    Comment: Performed at [facility], [address] [place], [place] [address]  Culture, blood (Routine X 2) w Reflex to ID Panel     Status: Abnormal   Collection Time: 02/20/22  7:54 AM   Specimen: BLOOD  Result Value Ref Range Status   Specimen Description   Final    BLOOD BLOOD RIGHT FOREARM Performed at [facility] [address] [place], [place] [address]    Special Requests   Final    BOTTLES DRAWN AEROBIC AND ANAEROBIC Blood Culture adequate volume Performed at [facility] [address] [place], [place] [address]    Culture  Setup Time   Final    GRAM POSITIVE COCCI IN BOTH AEROBIC AND ANAEROBIC BOTTLES CRITICAL VALUE NOTED.  VALUE IS CONSISTENT WITH PREVIOUSLY REPORTED AND CALLED VALUE.    Culture (A)  Final    ENTEROCOCCUS FAECIUM SUSCEPTIBILITIES PERFORMED ON PREVIOUS CULTURE WITHIN THE LAST 5 DAYS. Performed at [facility], [place] [address] [place], [place] [address]    Report Status 02/23/2022 FINAL  Final  Culture, blood (Routine X  2) w Reflex to ID Panel     Status: None   Collection Time: 02/21/22  6:32 AM   Specimen: BLOOD  Result Value Ref Range Status   Specimen Description   Final    BLOOD BLOOD RIGHT FOREARM Performed at [facility] [address] [place], [place]  [address]    Special Requests   Final    BOTTLES DRAWN AEROBIC ONLY Blood Culture adequate volume Performed at [facility] [address] [place], [place] [address]    Culture   Final    NO GROWTH 5 DAYS Performed at [facility], [place] [address] [place], [place] [address]    Report Status 02/26/2022 FINAL  Final  Culture, blood (Routine X 2) w Reflex to ID Panel     Status: None   Collection Time: 02/21/22  6:32 AM   Specimen: BLOOD  Result Value Ref Range Status   Specimen Description   Final    BLOOD BLOOD RIGHT HAND Performed at [facility] [address], [place], [place] [address]    Special Requests   Final    IN PEDIATRIC BOTTLE Blood Culture results may not be optimal due to an inadequate volume of blood received in culture bottles Performed at [facility] [address] [place], [place] [address]    Culture   Final    NO GROWTH 5 DAYS Performed at [facility], [place] [address], [place],  [address]    Report Status 02/26/2022 FINAL  Final          Radiology Studies: US RENAL  Result Date: 02/26/2022 CLINICAL DATA:  Renal failure.  History of colon cancer.  CLL. EXAM: RENAL / URINARY TRACT ULTRASOUND COMPLETE COMPARISON:  CT abdomen and pelvis without contrast 02/19/2022 FINDINGS: Right Kidney: Renal measurements: 11.1 by 4.0 x 4.5 cm = volume: 103 mL. Echogenicity within normal limits. There are benign anechoic avascular simple cysts measuring up to 2.4 cm within the right upper pole and 2.0 cm within the right midpole. No hydronephrosis. Left Kidney: Renal measurements: 11.3 x 5.6 x 4.6 cm = volume: 15.5 mL. Echogenicity within normal limits. Left midpole anechoic avascular simple cyst measuring 1.9 cm. No hydronephrosis. Bladder: Appears normal for degree of bladder distention. Other: Mild free fluid is seen within the pelvis as on recent CT. IMPRESSION: 1. Bilateral benign simple renal  cysts. 2. No hydronephrosis. 3. Mild free fluid within the pelvis as on recent CT. Electronically Signed   By: Yvonne Kendall M.D.   On: 02/26/2022 13:49        Scheduled Meds:  aspirin  81 mg Per Tube Daily   atorvastatin  40 mg Per Tube Daily   chlorhexidine  15 mL Mouth Rinse BID   feeding supplement (PROSource TF)  45 mL Per Tube BID   fiber  1 packet Per Tube TID   free water  150 mL Per Tube Q4H   furosemide  40 mg Intravenous Q8H   guaiFENesin-dextromethorphan  5 mL Per Tube Q8H   heparin  5,000 Units Subcutaneous Q8H   insulin aspart  0-9 Units Subcutaneous Q4H   insulin aspart  6 Units Subcutaneous Q4H   insulin glargine-yfgn  15 Units Subcutaneous Daily   metoprolol tartrate  12.5 mg Per Tube BID   mirtazapine  7.5 mg Per Tube QHS   multivitamin with minerals  1 tablet Per Tube Daily   ondansetron  4 mg Per  Tube Q8H   Or   ondansetron (ZOFRAN) IV  4 mg Intravenous Q8H   pantoprazole sodium  40 mg Per Tube BID   thiamine  100 mg Per Tube Daily   zinc sulfate  220 mg Per Tube Daily   Continuous Infusions:  sodium chloride Stopped (02/20/22 0850)   ampicillin (OMNIPEN) IV 2 g (02/28/22 0247)   feeding supplement (OSMOLITE 1.5 CAL) 1,000 mL (02/27/22 2031)          Aline August, MD [facility] 02/28/2022, 7:46 AM

## 2022-02-28 NOTE — Progress Notes (Signed)
[name] Progress Note  Subjective: 350 cc UOP yesterday, 450 today so far. Creat up 3.5 today. Got 110m total IV lasix yesterday. Feels about the same today. No confusion or jerking.   Vitals:   02/27/22 0421 02/27/22 1309 02/27/22 2041 02/28/22 0338  BP:  (!) 117/55 (!) 129/49 (!) 129/46  Pulse:  91 99 100  Resp:  _0 Temp:  98 F (36.7 C) 98.6 F (37 C) 98.1 F (36.7 C)  TempSrc:  Oral Oral Oral  SpO2:  100% 99% 100%  Weight: 65.7 kg   65 kg  Height:        Exam: Gen alert, no distress, fatigued No jvd or bruits Chest clear bilat to bases, dec'd R base RRR no MRG Abd soft ntnd no mass or ascites +bs GU normal male Ext 2-3+ bilat UE/ LE edema Neuro is alert, Ox 3 , nf      Home meds include - norvasc 2.5, aspirin, lipitor, famotidine, ibrutinib, lisinopril 20, metformin, metoprolol 12.5 bid, ondanstron, pantoprazole, prochlorperazine, zolpidem         CT chest/ abd/ pelv 5/25 - Bulky upper abdominal lymphadenopathy, new. Progressive mediastinal and retroperitoneal lymphadenopathy. These are poorly evaluated but favor progressive lymphoma. Status post left hemicolectomy. No findings specific for recurrent or metastatic disease. Small bilateral pleural effusions, right greater than left. Small volume abdominopelvic ascites.     CXR 5/29 - IMPRESSION: Low lung volumes with bibasilar atelectasis. Dobbhoff feeding tube tip in proximal stomach.       Date                          Creat               eGFR    2009- 2010               0.87- 1.26    2011- 2013               1.10- 1.20    2014- 2019               1.20- 1.38    2020                         1.20- 1.66    2021                         1.75- 2.30    Jan - jun 2022          1.40- 1.92    July - dec 2022        2.13- 2.18            12/02/21                     2.55                 25 ml/min            5/25                          1.74                 39 ml/min    5/27  1.53    5/29                          2.06    5/31                          2.61    6/01                          3.11    6/02                          3.22          WBC 2.9,  Hb 7.5  plt 91k     Alb 1.5, LFT"s okay, AG 5  CO2 20   BUN 86  Cr 3.22     UA  5/25 - 30 protein, 0-5 rbc/ wbc, >1000 glucose   Renal US 6/01 > 11- 12 cm kidneys, normal echo, no hydro, simple cysts   CT abd w/o contrast 5/25 - Kidneys: bilateral renal cysts, measuring up to 2.2 cm in the right upper kidney (series 3/image 60). No hydronephrosis. Bladder is within normal limits        I/O = + 17 L net since admitted        Wt's = up 14 kg since admission         B/l creatinine is 1.74- 2.55 from march 2023 and here, eGFR 25- 39     Assessment/ Plan: AKI on CKD 3b - b/l creatinine 1.74- 2.55 from march 2023 and this admit, eGFR 25- 39 ml/min.  Creat 1.7 on admit 5/25, rising up to 3.2 yest and 3.5 today. This in the setting of SIRS, enterococcus faecium bacteremia and FTT.  UA negative, no obstruction by CT. No contrast, minimal IV vanc, no acie/ ARB.  Last CXR neg on 5/29. BP's low to normal. Got several days of IVF's and is up 30 lbs in wt.  We lowered the BP meds and tried IV lasix but creat bumped higher today and the response was poor. Will dc lasix and try IV albumin. Pt is DNR. Would not recommend dialysis due to comorbidities (advanced cancer). Will follow.  E. Faecium bacteremia - getting IV ampicillin Neutropenia DM2 FTT CLL HTN - have lowered BP lowering meds        [name] 02/28/2022, 12:08 PM   Recent Labs  Lab 02/24/22 0329 02/24/22 1723 02/25/22 0424 02/26/22 0420 02/27/22 0352 02/28/22 0439  HGB 8.8*  --  8.7*   < > 7.5* 8.0*  ALBUMIN  --   --  1.7*  --  1.5*  --   CALCIUM 7.1*  --  6.7*   < > 6.4* 6.7*  PHOS 2.4* 1.6*  --   --   --   --   CREATININE 2.56*  --  2.61*   < > 3.22* 3.59*  K 4.0  --  4.0   < > 4.3 4.6   < > = values in this interval not displayed.   Inpatient  medications:  aspirin  81 mg Per Tube Daily   atorvastatin  40 mg Per Tube Daily   chlorhexidine  15 mL Mouth Rinse BID   feeding supplement (PROSource TF)  45 mL Per Tube BID   fiber  1 packet Per Tube TID   free water  150 mL Per Tube Q4H   guaiFENesin-dextromethorphan  5 mL Per Tube Q8H   heparin  5,000 Units Subcutaneous Q8H   insulin aspart  0-9 Units Subcutaneous Q4H   insulin aspart  6 Units Subcutaneous Q4H   insulin glargine-yfgn  15 Units Subcutaneous Daily   metoprolol tartrate  12.5 mg Per Tube BID   mirtazapine  7.5 mg Per Tube QHS   multivitamin with minerals  1 tablet Per Tube Daily   ondansetron  4 mg Per Tube Q8H   Or   ondansetron (ZOFRAN) IV  4 mg Intravenous Q8H   pantoprazole sodium  40 mg Per Tube BID   thiamine  100 mg Per Tube Daily   zinc sulfate  220 mg Per Tube Daily    sodium chloride Stopped (02/20/22 0850)   ampicillin (OMNIPEN) IV 2 g (02/28/22 1112)   feeding supplement (OSMOLITE 1.5 CAL) 1,000 mL (02/27/22 2031)   sodium chloride, acetaminophen, labetalol, liver oil-zinc oxide, loperamide HCl, prochlorperazine

## 2022-02-28 NOTE — Progress Notes (Addendum)
1030 NGT used for morning medication admnistration. + positioning per auscultation. Upon completing med pass tube assessed at 45 cm, rather than 60cm as marked in [place]. Feeding held. MD aware. XR ordered to confirm placement.  XR states tip of tube overlies GEJ. MD advised Dietician input. Dietician suggested gently advancing tube back to 60 cm marker and rechecking XR.  1540 Repeat XR unchanged with distal tube still at GEJ. Per Dr. Remi Haggard, D/C tube. Will follow up Monday. Educated patient and wife. Tube removed.

## 2022-03-01 LAB — GLUCOSE, CAPILLARY
Glucose-Capillary: 104 mg/dL — ABNORMAL HIGH (ref 70–99)
Glucose-Capillary: 87 mg/dL (ref 70–99)
Glucose-Capillary: 77 mg/dL (ref 70–99)
Glucose-Capillary: 75 mg/dL (ref 70–99)
Glucose-Capillary: 76 mg/dL (ref 70–99)

## 2022-03-01 LAB — CBC WITH DIFFERENTIAL/PLATELET
Abs Immature Granulocytes: 0.24 10*3/uL — ABNORMAL HIGH (ref 0.00–0.07)
Basophils Absolute: 0 10*3/uL (ref 0.0–0.1)
Basophils Relative: 1 %
Eosinophils Absolute: 0 10*3/uL (ref 0.0–0.5)
Eosinophils Relative: 1 %
HCT: 22.4 % — ABNORMAL LOW (ref 39.0–52.0)
Hemoglobin: 6.9 g/dL — CL (ref 13.0–17.0)
Immature Granulocytes: 13 %
Lymphocytes Relative: 38 %
Lymphs Abs: 0.7 10*3/uL (ref 0.7–4.0)
MCH: 26.4 pg (ref 26.0–34.0)
MCHC: 30.8 g/dL (ref 30.0–36.0)
MCV: 85.8 fL (ref 80.0–100.0)
Monocytes Absolute: 0.1 10*3/uL (ref 0.1–1.0)
Monocytes Relative: 5 %
Neutro Abs: 0.8 10*3/uL — ABNORMAL LOW (ref 1.7–7.7)
Neutrophils Relative %: 42 %
Platelets: 90 10*3/uL — ABNORMAL LOW (ref 150–400)
RBC: 2.61 MIL/uL — ABNORMAL LOW (ref 4.22–5.81)
RDW: 18.3 % — ABNORMAL HIGH (ref 11.5–15.5)
WBC: 1.8 10*3/uL — ABNORMAL LOW (ref 4.0–10.5)
nRBC: 0 % (ref 0.0–0.2)

## 2022-03-01 LAB — PREPARE RBC (CROSSMATCH)

## 2022-03-01 LAB — HEMOGLOBIN AND HEMATOCRIT, BLOOD
HCT: 27.1 % — ABNORMAL LOW (ref 39.0–52.0)
Hemoglobin: 8.8 g/dL — ABNORMAL LOW (ref 13.0–17.0)

## 2022-03-01 LAB — MAGNESIUM: Magnesium: 2.1 mg/dL (ref 1.7–2.4)

## 2022-03-01 LAB — COMPREHENSIVE METABOLIC PANEL
ALT: 22 U/L (ref 0–44)
AST: 22 U/L (ref 15–41)
Albumin: 2.3 g/dL — ABNORMAL LOW (ref 3.5–5.0)
Alkaline Phosphatase: 95 U/L (ref 38–126)
Anion gap: 4 — ABNORMAL LOW (ref 5–15)
BUN: 86 mg/dL — ABNORMAL HIGH (ref 8–23)
CO2: 21 mmol/L — ABNORMAL LOW (ref 22–32)
Calcium: 7.2 mg/dL — ABNORMAL LOW (ref 8.9–10.3)
Chloride: 117 mmol/L — ABNORMAL HIGH (ref 98–111)
Creatinine, Ser: 3.45 mg/dL — ABNORMAL HIGH (ref 0.61–1.24)
GFR, Estimated: 17 mL/min — ABNORMAL LOW (ref >60)
Glucose, Bld: 105 mg/dL — ABNORMAL HIGH (ref 70–99)
Potassium: 4.7 mmol/L (ref 3.5–5.1)
Sodium: 142 mmol/L (ref 135–145)
Total Bilirubin: 0.7 mg/dL (ref 0.3–1.2)
Total Protein: 4.1 g/dL — ABNORMAL LOW (ref 6.5–8.1)

## 2022-03-01 MED ORDER — DEXTROSE 50 % IV SOLN
1.0000 | Freq: Once | INTRAVENOUS | Status: AC
  Administered 2022-03-01: 50 mL via INTRAVENOUS
  Filled 2022-03-01: qty 50

## 2022-03-01 MED ORDER — METOPROLOL TARTRATE 5 MG/5ML IV SOLN: 2.5000 mg | Freq: Four times a day (QID) | INTRAVENOUS | Status: DC | PRN

## 2022-03-01 MED ORDER — SODIUM CHLORIDE 0.9% IV SOLUTION: Freq: Once | INTRAVENOUS | Status: AC

## 2022-03-01 NOTE — Progress Notes (Signed)
PROGRESS NOTE    [name]  UUV:[id number] DOB: 06-12-1941 DOA: 02/19/2022 PCP: [name], MD   Brief Narrative:  81 year old male with history of CLL initially diagnosed in 2014, status postchemotherapy and currently on Imbruvica, DM 2, HTN, colorectal cancer 2009, who came into the hospital with progressive and generalized weakness.  Patient has been experiencing some weight loss and poor appetite for the past several months, was seen by GI in February as an outpatient and underwent work-up without any significant major findings.  He was taken off Imbruvica in April as it was believed that it may be related to that, however he continued to decline.  He was hospitalized just 2 weeks ago for dehydration, and when he left the hospital he was placed back on his Imbruvica.  While at home he continued to decline and was rehospitalized on 5/25.  Repeat CT scan of the abdomen and pelvis showed recurrence of the CLL, he was also found to be neutropenic, have a fever and have Enterococcus bacteremia.  Oncology and ID consulted.  Palliative care has also been consulted for goals of care discussion.  Nephrology was consulted on 02/27/2022 for worsening creatinine.  Assessment & Plan:   Sepsis: Present on admission Enterococcus faecium bacteremia Neutropenic fever -2D echo showed normal EF of 60 to 65%, small pericardial effusion,  there was also a nodular calcification in the right coronary cusp likely sclerosis but if high suspicion for SBE, consider TEE.  ID following and recommending TEE.  Cardiology was the opinion that patient is high risk for TEE.  Patient has opted to not proceed with TEE.  ID recommends to continue ampicillin 2 weeks from 02/21/2022 (end date 03/07/2022): If discharged earlier, can transition to Zyvox to finish the course repeat TTE in around 2 weeks.  Outpatient follow-up with ID -currently on ampicillin.  No temperature spikes recently.  Blood pressure stable. -Repeat blood  cultures from 02/21/2022 are negative so far  Goals of care Failure to thrive Dehydration/dysphagia/chronic cough CLL Physical deconditioning -overall difficult situation and complex case given progression of his CLL with progressive lymphadenopathy and now complicated by persistent neutropenia and Enterococcus bacteremia with concern for infectious endocarditis.   -Palliative care following -Oncology following -Cortrak to bed to be removed on 02/28/2022 because of malposition which did not improve despite trying to reposition it by the nursing staff.  He has not made up his mind about PEG tube placement/feeding. SLP still recommends n.p.o. except ice chips.  SLP will reevaluate the patient on Monday with MBS.  Dietitian also to reevaluate him on Monday and place cortrak tube as needed -Dietitian following  Acute kidney injury on CKD stage IIIb -Baseline creatinine 1.4-1.6.  3.45 today.   -Renal ultrasound negative for hydronephrosis.  Repeat a.m. labs.   -Nephrology following: Diuretics discontinued by nephrology and patient was given albumin.  Patient is not a candidate for dialysis.  If renal function continues to worsen, she would be appropriate for hospice/comfort measures  Diarrhea -Possibly from tube feeds and antibiotic use.  Imodium as needed.  Patient currently has a rectal tube.  Pancytopenia -Due to malignancy.  Patient received a few doses of Granix as per oncology recommendations for neutropenic fever.  Hemoglobin 6.9 today.  Transfuse 1 unit of packed red cells.  Hyperlipidemia -Continue statin  Essential hypertension  -continue metoprolol.  Might have to increase the dose of metoprolol because of tachycardia.  Amlodipine discontinued on 02/27/2022 by nephrology  Hyperlipidemia -Continue statin  Diabetes mellitus type 2  with hyperglycemia -Blood sugars are currently on the lower side.  Discontinue long-acting insulin and NovoLog.  Continue CBGs with SSI.  Severe protein  calorie malnutrition/hypoalbuminemia -Dietitian following for tube feedings   DVT prophylaxis: Hold heparin due to drop in hemoglobin Code Status: Full Family Communication: Wife at bedside Disposition Plan: Status is: Inpatient Remains inpatient appropriate because: Of severity of illness  Consultants: ID/oncology/palliative care/nephrology  Procedures: 2D echo  Antimicrobials:  Anti-infectives (From admission, onward)    Start     Dose/Rate Route Frequency Ordered Stop   02/23/22 1900  ampicillin (OMNIPEN) 2 g in sodium chloride 0.9 % 100 mL IVPB        2 g 300 mL/hr over 20 Minutes Intravenous Every 8 hours 02/23/22 1059 03/07/22 2359   02/21/22 1000  ampicillin (OMNIPEN) 2 g in sodium chloride 0.9 % 100 mL IVPB  Status:  Discontinued        2 g 300 mL/hr over 20 Minutes Intravenous Every 6 hours 02/21/22 0940 02/23/22 1059   02/21/22 0300  ampicillin (OMNIPEN) 2 g in sodium chloride 0.9 % 100 mL IVPB  Status:  Discontinued        2 g 300 mL/hr over 20 Minutes Intravenous Every 8 hours 02/21/22 0215 02/21/22 0940   02/20/22 0815  vancomycin (VANCOREADY) IVPB 1250 mg/250 mL  Status:  Discontinued        1,250 mg 166.7 mL/hr over 90 Minutes Intravenous Every 48 hours 02/20/22 0715 02/21/22 0215   02/20/22 0800  ceFEPIme (MAXIPIME) 2 g in sodium chloride 0.9 % 100 mL IVPB  Status:  Discontinued        2 g 200 mL/hr over 30 Minutes Intravenous Every 24 hours 02/20/22 0643 02/21/22 0215   02/20/22 0800  vancomycin (VANCOREADY) IVPB 1750 mg/350 mL  Status:  Discontinued        1,750 mg 175 mL/hr over 120 Minutes Intravenous Every 48 hours 02/20/22 0703 02/20/22 0715        Subjective: Patient seen and examined at bedside.  Poor historian.  No agitation, vomiting, fever reported.   Objective: Vitals:   02/28/22 2134 03/01/22 0402 03/01/22 0500 03/01/22 0801  BP: 128/60 124/62  (!) 126/56  Pulse: (!) 104 (!) 110  (!) 109  Resp: '16 16  15  '$ Temp: 98.2 F (36.8 C) 98.2 F  (36.8 C)  97.8 F (36.6 C)  TempSrc:  Oral    SpO2: 100% 100%  100%  Weight:   65 kg   Height:        Intake/Output Summary (Last 24 hours) at 03/01/2022 0807 Last data filed at 03/01/2022 0000 Gross per 24 hour  Intake 718.87 ml  Output 1400 ml  Net -681.13 ml    Filed Weights   02/27/22 0421 02/28/22 0338 03/01/22 0500  Weight: 65.7 kg 65 kg 65 kg    Examination:  General: On room air.  No acute distress.  Chronically ill and deconditioned looking. ENT/neck: NG tube has been removed.  No palpable neck masses noted.  JVD is not elevated  respiratory: Decreased breath sounds at bases bilaterally with scattered crackles CVS: Tachycardic currently; S1 and S2 heard  abdominal: Soft, nontender, distended still; no organomegaly, normal bowel sounds noted  extremities: Bilateral lower extremity edema present; no cyanosis  CNS: Very poor historian; alert.  Still extremely slow to respond.  No focal neurologic deficit.  Moves extremities  lymph: No obvious cervical lymphadenopathy  skin: No petechiae/rashes  psych: Extremely flat affect.  Currently  showing no signs of agitation  musculoskeletal: No obvious joint swelling/deformity    Data Reviewed: I have personally reviewed following labs and imaging studies  CBC: Recent Labs  Lab 02/25/22 0424 02/26/22 0420 02/27/22 0352 02/28/22 0439 03/01/22 0358  WBC 1.6* 2.3* 2.9* 3.1* 1.8*  NEUTROABS 0.3* 1.4* 1.7 1.6* 0.8*  HGB 8.7* 7.7* 7.5* 8.0* 6.9*  HCT 29.0* 25.0* 24.4* 26.4* 22.4*  MCV 88.4 86.5 85.6 88.6 85.8  PLT 87* 91* 91* 102* 90*    Basic Metabolic Panel: Recent Labs  Lab 02/22/22 1645 02/22/22 1645 02/23/22 0542 02/23/22 1722 02/24/22 0329 02/24/22 1723 02/25/22 0424 02/26/22 0420 02/27/22 0352 02/28/22 0439 03/01/22 0358  NA  --    < > 134*  --  138  --  139 139 138 138 142  K  --    < > 3.8  --  4.0  --  4.0 4.1 4.3 4.6 4.7  CL  --    < > 104  --  108  --  110 112* 113* 113* 117*  CO2  --    < > 23   --  21*  --  19* 21* 20* 19* 21*  GLUCOSE  --    < > 353*  --  304*  --  221* 239* 179* 214* 105*  BUN  --    < > 48*  --  61*  --  67* 83* 86* 88* 86*  CREATININE  --    < > 2.06*  --  2.56*  --  2.61* 3.11* 3.22* 3.59* 3.45*  CALCIUM  --    < > 7.2*  --  7.1*  --  6.7* 6.7* 6.4* 6.7* 7.2*  MG 1.8  --  1.9 1.9 2.0 1.9 1.9 1.9 2.0 2.1 2.1  PHOS 3.6  --  3.4 2.7 2.4* 1.6*  --   --   --   --   --    < > = values in this interval not displayed.    GFR: Estimated Creatinine Clearance: 16 mL/min (A) (by C-G formula based on SCr of 3.45 mg/dL (H)). Liver Function Tests: Recent Labs  Lab 02/23/22 0542 02/25/22 0424 02/27/22 0352 03/01/22 0358  AST '29 30 23 22  '$ ALT '31 27 26 22  '$ ALKPHOS 79 106 109 95  BILITOT 0.6 0.5 0.6 0.7  PROT 4.2* 4.0* 3.7* 4.1*  ALBUMIN 2.0* 1.7* 1.5* 2.3*    No results for input(s): LIPASE, AMYLASE in the last 168 hours. No results for input(s): AMMONIA in the last 168 hours. Coagulation Profile: No results for input(s): INR, PROTIME in the last 168 hours. Cardiac Enzymes: No results for input(s): CKTOTAL, CKMB, CKMBINDEX, TROPONINI in the last 168 hours. BNP (last 3 results) No results for input(s): PROBNP in the last 8760 hours. HbA1C: No results for input(s): HGBA1C in the last 72 hours. CBG: Recent Labs  Lab 02/28/22 2130 02/28/22 2144 02/28/22 2346 03/01/22 0359 03/01/22 0754  GLUCAP 82 83 73 104* 87    Lipid Profile: No results for input(s): CHOL, HDL, LDLCALC, TRIG, CHOLHDL, LDLDIRECT in the last 72 hours. Thyroid Function Tests: No results for input(s): TSH, T4TOTAL, FREET4, T3FREE, THYROIDAB in the last 72 hours. Anemia Panel: No results for input(s): VITAMINB12, FOLATE, FERRITIN, TIBC, IRON, RETICCTPCT in the last 72 hours. Sepsis Labs: No results for input(s): PROCALCITON, LATICACIDVEN in the last 168 hours.   Recent Results (from the past 240 hour(s))  Culture, blood (Routine X 2) w Reflex to ID Panel  Status: Abnormal    Collection Time: 02/20/22  7:50 AM   Specimen: BLOOD  Result Value Ref Range Status   Specimen Description BLOOD LEFT ANTECUBITAL  Final   Special Requests   Final    BOTTLES DRAWN AEROBIC AND ANAEROBIC Blood Culture adequate volume   Culture  Setup Time   Final    GRAM POSITIVE COCCI IN BOTH AEROBIC AND ANAEROBIC BOTTLES CRITICAL RESULT CALLED TO, READ BACK BY AND VERIFIED WITH: E JACKSON,PHARMD'@0200'$  02/21/22 [place]    Culture ENTEROCOCCUS FAECIUM (A)  Final   Report Status 02/22/2022 FINAL  Final   Organism ID, Bacteria ENTEROCOCCUS FAECIUM  Final      Susceptibility   Enterococcus faecium - MIC*    AMPICILLIN <=2 SENSITIVE Sensitive     VANCOMYCIN <=0.5 SENSITIVE Sensitive     GENTAMICIN SYNERGY SENSITIVE Sensitive     LINEZOLID Value in next row Sensitive      SENSITIVE2Performed at [facility], [address], [place], [place] [address]    * ENTEROCOCCUS FAECIUM  Blood Culture ID Panel (Reflexed)     Status: Abnormal   Collection Time: 02/20/22  7:50 AM  Result Value Ref Range Status   Enterococcus faecalis NOT DETECTED NOT DETECTED Final   Enterococcus Faecium DETECTED (A) NOT DETECTED Final    Comment: CRITICAL RESULT CALLED TO, READ BACK BY AND VERIFIED WITH: E JACKSON,PHARMD'@0200'$  02/21/22 [place]    Listeria monocytogenes NOT DETECTED NOT DETECTED Final   Staphylococcus species NOT DETECTED NOT DETECTED Final   Staphylococcus aureus (BCID) NOT DETECTED NOT DETECTED Final   Staphylococcus epidermidis NOT DETECTED NOT DETECTED Final   Staphylococcus lugdunensis NOT DETECTED NOT DETECTED Final   Streptococcus species NOT DETECTED NOT DETECTED Final   Streptococcus agalactiae NOT DETECTED NOT DETECTED Final   Streptococcus pneumoniae NOT DETECTED NOT DETECTED Final   Streptococcus pyogenes NOT DETECTED NOT DETECTED Final   A.calcoaceticus-baumannii NOT DETECTED NOT DETECTED Final   Bacteroides fragilis NOT DETECTED NOT DETECTED Final   Enterobacterales NOT DETECTED NOT  DETECTED Final   Enterobacter cloacae complex NOT DETECTED NOT DETECTED Final   Escherichia coli NOT DETECTED NOT DETECTED Final   Klebsiella aerogenes NOT DETECTED NOT DETECTED Final   Klebsiella oxytoca NOT DETECTED NOT DETECTED Final   Klebsiella pneumoniae NOT DETECTED NOT DETECTED Final   Proteus species NOT DETECTED NOT DETECTED Final   Salmonella species NOT DETECTED NOT DETECTED Final   Serratia marcescens NOT DETECTED NOT DETECTED Final   Haemophilus influenzae NOT DETECTED NOT DETECTED Final   Neisseria meningitidis NOT DETECTED NOT DETECTED Final   Pseudomonas aeruginosa NOT DETECTED NOT DETECTED Final   Stenotrophomonas maltophilia NOT DETECTED NOT DETECTED Final   Candida albicans NOT DETECTED NOT DETECTED Final   Candida auris NOT DETECTED NOT DETECTED Final   Candida glabrata NOT DETECTED NOT DETECTED Final   Candida krusei NOT DETECTED NOT DETECTED Final   Candida parapsilosis NOT DETECTED NOT DETECTED Final   Candida tropicalis NOT DETECTED NOT DETECTED Final   Cryptococcus neoformans/gattii NOT DETECTED NOT DETECTED Final   Vancomycin resistance NOT DETECTED NOT DETECTED Final    Comment: Performed at [facility], [address], [place], [place] [address]  Culture, blood (Routine X 2) w Reflex to ID Panel     Status: Abnormal   Collection Time: 02/20/22  7:54 AM   Specimen: BLOOD  Result Value Ref Range Status   Specimen Description   Final    BLOOD BLOOD RIGHT FOREARM Performed at [facility]  [place] [address], [place], [place] [address]    Special Requests   Final    BOTTLES DRAWN AEROBIC AND ANAEROBIC Blood Culture adequate volume Performed at [place] [address], [place], [place] [address]    Culture  Setup Time   Final    GRAM POSITIVE COCCI IN BOTH AEROBIC AND ANAEROBIC BOTTLES CRITICAL VALUE NOTED.  VALUE IS CONSISTENT WITH PREVIOUSLY REPORTED AND CALLED VALUE.    Culture (A)  Final    ENTEROCOCCUS  FAECIUM SUSCEPTIBILITIES PERFORMED ON PREVIOUS CULTURE WITHIN THE LAST 5 DAYS. Performed at [facility], [place] [address] [place], [place] [address]    Report Status 02/23/2022 FINAL  Final  Culture, blood (Routine X 2) w Reflex to ID Panel     Status: None   Collection Time: 02/21/22  6:32 AM   Specimen: BLOOD  Result Value Ref Range Status   Specimen Description   Final    BLOOD BLOOD RIGHT FOREARM Performed at [place] [address] [place], [place] [address]    Special Requests   Final    BOTTLES DRAWN AEROBIC ONLY Blood Culture adequate volume Performed at [place] [address], [place], [place] [address]    Culture   Final    NO GROWTH 5 DAYS Performed at [facility], [place] [address] [place], [place] [address]    Report Status 02/26/2022 FINAL  Final  Culture, blood (Routine X 2) w Reflex to ID Panel     Status: None   Collection Time: 02/21/22  6:32 AM   Specimen: BLOOD  Result Value Ref Range Status   Specimen Description   Final    BLOOD BLOOD RIGHT HAND Performed at [place] [address] [place], [place] [address]    Special Requests   Final    IN PEDIATRIC BOTTLE Blood Culture results may not be optimal due to an inadequate volume of blood received in culture bottles Performed at [facility] [place] [address] [place], [place] [address]    Culture   Final    NO GROWTH 5 DAYS Performed at [facility], [place] [address] [place], [place] [address]    Report Status 02/26/2022 FINAL  Final          Radiology Studies: DG Abd 1 View  Result Date: 02/28/2022 CLINICAL DATA:  Feeding tube placement. EXAM: ABDOMEN - 1 VIEW COMPARISON:  Prior today FINDINGS: Feeding tube remains unchanged in position, with tip in the region of the GE junction. No evidence of dilated bowel loops. IMPRESSION: Feeding tube tip remains near the GE junction. Electronically  Signed   By: Marlaine Hind M.D.   On: 02/28/2022 15:07   DG Abd Portable 1V  Result Date: 02/28/2022 CLINICAL DATA:  Feeding tube placement. EXAM: PORTABLE ABDOMEN - 1 VIEW COMPARISON:  None Available. FINDINGS: A feeding tube is now seen with tip overlying the GE junction. No evidence of dilated bowel loops IMPRESSION: Feeding tube tip overlies the GE junction. Electronically Signed   By: Marlaine Hind M.D.   On: 02/28/2022 13:01        Scheduled Meds:  aspirin  81 mg Per Tube Daily   atorvastatin  40 mg Per Tube Daily   chlorhexidine  15 mL Mouth Rinse BID   feeding supplement (PROSource TF)  45 mL Per Tube BID   fiber  1 packet Per Tube TID  free water  150 mL Per Tube Q4H   guaiFENesin-dextromethorphan  5 mL Per Tube Q8H   heparin  5,000 Units Subcutaneous Q8H   insulin aspart  0-9 Units Subcutaneous Q4H   insulin aspart  6 Units Subcutaneous Q4H   insulin glargine-yfgn  15 Units Subcutaneous Daily   metoprolol tartrate  12.5 mg Per Tube BID   mirtazapine  7.5 mg Per Tube QHS   multivitamin with minerals  1 tablet Per Tube Daily   ondansetron  4 mg Per Tube Q8H   Or   ondansetron (ZOFRAN) IV  4 mg Intravenous Q8H   pantoprazole sodium  40 mg Per Tube BID   thiamine  100 mg Per Tube Daily   zinc sulfate  220 mg Per Tube Daily   Continuous Infusions:  sodium chloride Stopped (02/20/22 0850)   albumin human 25 g (02/28/22 2123)   ampicillin (OMNIPEN) IV 2 g (03/01/22 0159)   feeding supplement (OSMOLITE 1.5 CAL) Stopped (02/28/22 1030)          Aline August, MD [facility] 03/01/2022, 8:07 AM

## 2022-03-01 NOTE — Progress Notes (Signed)
[facility] Progress Note  Subjective: 1800 cc UOP yesterday w/ IV albumin. Creat down slightly to 3.4.   Vitals:   03/01/22 0500 03/01/22 0801 03/01/22 1246 03/01/22 1314  BP:  (!) 126/56 (!) 142/63 (!) 146/64  Pulse:  (!) 109 (!) 113 (!) 113  Resp:  15 (!) 22 (!) 22  Temp:  97.8 F (36.6 C) 98 F (36.7 C) 98.4 F (36.9 C)  TempSrc:  Oral Oral Oral  SpO2:  100% 100% 100%  Weight: 65 kg     Height:        Exam: Gen alert, no distress, fatigued No jvd or bruits Chest clear bilat to bases, dec'd R base RRR no MRG Abd soft ntnd no mass or ascites +bs GU normal male Ext 2-3+ bilat UE/ LE edema Neuro is alert, Ox 3 , nf      Home meds include - norvasc 2.5, aspirin, lipitor, famotidine, ibrutinib, lisinopril 20, metformin, metoprolol 12.5 bid, ondanstron, pantoprazole, prochlorperazine, zolpidem         CT chest/ abd/ pelv 5/25 - Bulky upper abdominal lymphadenopathy, new. Progressive mediastinal and retroperitoneal lymphadenopathy. These are poorly evaluated but favor progressive lymphoma. Status post left hemicolectomy. No findings specific for recurrent or metastatic disease. Small bilateral pleural effusions, right greater than left. Small volume abdominopelvic ascites.     CXR 5/29 - IMPRESSION: Low lung volumes with bibasilar atelectasis. Dobbhoff feeding tube tip in proximal stomach.       Date                          Creat               eGFR    2009- 2010               0.87- 1.26    2011- 2013               1.10- 1.20    2014- 2019               1.20- 1.38    2020                         1.20- 1.66    2021                         1.75- 2.30    Jan - jun 2022          1.40- 1.92    July - dec 2022        2.13- 2.18            12/02/21                     2.55                 25 ml/min            5/25                          1.74                 39 ml/min    5/27                          1.53    5/29  2.06    5/31                           2.61    6/01                          3.11    6/02                          3.22          WBC 2.9,  Hb 7.5  plt 91k     Alb 1.5, LFT"s okay, AG 5  CO2 20   BUN 86  Cr 3.22     UA  5/25 - 30 protein, 0-5 rbc/ wbc, >1000 glucose   Renal US 6/01 > 11- 12 cm kidneys, normal echo, no hydro, simple cysts   CT abd w/o contrast 5/25 - Kidneys: bilateral renal cysts, measuring up to 2.2 cm in the right upper kidney (series 3/image 60). No hydronephrosis. Bladder is within normal limits        I/O = + 17 L net since admitted        Wt's = up 14 kg since admission         B/l creatinine is 1.74- 2.55 from march 2023 and here, eGFR 25- 39     Assessment/ Plan: AKI on CKD 3b - b/l creatinine 1.74- 2.55 from march 2023 and this admit, eGFR 25- 39 ml/min.  Creat 1.7 on admit 5/25 in the setting of SIRS, enterococcus faecium bacteremia and FTT.  UA negative, no obstruction by CT. No contrast, minimal IV vanc, no acie/ ARB.  Last CXR neg on 5/29. Pt got several days of IVF's and is up 30 lbs in wt.  We lowered the BP meds and tried IV lasix but creat bumped up and response was poor. Lasix dc'd and IV albumin started w/ improved UOP (1800cc) yesterday and creatinine dropped slightly today. Pt is DNR. Will cont IV albumin 2 more days. However this is not a long-term solution. Would not recommend dialysis due to comorbidities (advanced cancer). Will follow.  E. Faecium bacteremia - getting IV ampicillin Neutropenia DM2 FTT CLL HTN - have lowered BP lowering meds        Rob Kamariah Fruchter 03/01/2022, 2:53 PM   Recent Labs  Lab 02/24/22 0329 02/24/22 1723 02/25/22 0424 02/27/22 0352 02/28/22 0439 03/01/22 0358  HGB 8.8*  --    < > 7.5* 8.0* 6.9*  ALBUMIN  --   --    < > 1.5*  --  2.3*  CALCIUM 7.1*  --    < > 6.4* 6.7* 7.2*  PHOS 2.4* 1.6*  --   --   --   --   CREATININE 2.56*  --    < > 3.22* 3.59* 3.45*  K 4.0  --    < > 4.3 4.6 4.7   < > = values in this interval not displayed.     Inpatient medications:  atorvastatin  40 mg Per Tube Daily   chlorhexidine  15 mL Mouth Rinse BID   feeding supplement (PROSource TF)  45 mL Per Tube BID   fiber  1 packet Per Tube TID   free water  150 mL Per Tube Q4H   guaiFENesin-dextromethorphan  5 mL Per Tube Q8H   insulin aspart  0-9 Units Subcutaneous Q4H   metoprolol tartrate  12.5 mg Per Tube BID   mirtazapine  7.5 mg Per Tube QHS   multivitamin with minerals  1 tablet Per Tube Daily   ondansetron  4 mg Per Tube Q8H   Or   ondansetron (ZOFRAN) IV  4 mg Intravenous Q8H   pantoprazole sodium  40 mg Per Tube BID   zinc sulfate  220 mg Per Tube Daily    sodium chloride Stopped (02/20/22 0850)   albumin human 25 g (03/01/22 1043)   ampicillin (OMNIPEN) IV 2 g (03/01/22 1043)   feeding supplement (OSMOLITE 1.5 CAL) Stopped (02/28/22 1030)   sodium chloride, acetaminophen, labetalol, liver oil-zinc oxide, loperamide HCl, metoprolol tartrate, prochlorperazine

## 2022-03-02 ENCOUNTER — Other Ambulatory Visit (HOSPITAL_COMMUNITY): Payer: Self-pay

## 2022-03-02 ENCOUNTER — Inpatient Hospital Stay (HOSPITAL_COMMUNITY): Payer: Medicare Other

## 2022-03-02 LAB — TYPE AND SCREEN
ABO/RH(D): O NEG
Antibody Screen: NEGATIVE
Unit division: 0

## 2022-03-02 LAB — BASIC METABOLIC PANEL
Anion gap: 5 (ref 5–15)
BUN: 79 mg/dL — ABNORMAL HIGH (ref 8–23)
CO2: 20 mmol/L — ABNORMAL LOW (ref 22–32)
Calcium: 8.1 mg/dL — ABNORMAL LOW (ref 8.9–10.3)
Chloride: 122 mmol/L — ABNORMAL HIGH (ref 98–111)
Creatinine, Ser: 3.26 mg/dL — ABNORMAL HIGH (ref 0.61–1.24)
GFR, Estimated: 19 mL/min — ABNORMAL LOW (ref >60)
Glucose, Bld: 82 mg/dL (ref 70–99)
Potassium: 4.9 mmol/L (ref 3.5–5.1)
Sodium: 147 mmol/L — ABNORMAL HIGH (ref 135–145)

## 2022-03-02 LAB — CBC WITH DIFFERENTIAL/PLATELET
Abs Immature Granulocytes: 0.17 10*3/uL — ABNORMAL HIGH (ref 0.00–0.07)
Basophils Absolute: 0 10*3/uL (ref 0.0–0.1)
Basophils Relative: 1 %
Eosinophils Absolute: 0 10*3/uL (ref 0.0–0.5)
Eosinophils Relative: 1 %
HCT: 27.3 % — ABNORMAL LOW (ref 39.0–52.0)
Hemoglobin: 8.7 g/dL — ABNORMAL LOW (ref 13.0–17.0)
Immature Granulocytes: 8 %
Lymphocytes Relative: 32 %
Lymphs Abs: 0.7 10*3/uL (ref 0.7–4.0)
MCH: 27.8 pg (ref 26.0–34.0)
MCHC: 31.9 g/dL (ref 30.0–36.0)
MCV: 87.2 fL (ref 80.0–100.0)
Monocytes Absolute: 0.1 10*3/uL (ref 0.1–1.0)
Monocytes Relative: 5 %
Neutro Abs: 1.1 10*3/uL — ABNORMAL LOW (ref 1.7–7.7)
Neutrophils Relative %: 53 %
Platelets: 106 10*3/uL — ABNORMAL LOW (ref 150–400)
RBC: 3.13 MIL/uL — ABNORMAL LOW (ref 4.22–5.81)
RDW: 17.4 % — ABNORMAL HIGH (ref 11.5–15.5)
WBC: 2.2 10*3/uL — ABNORMAL LOW (ref 4.0–10.5)
nRBC: 0 % (ref 0.0–0.2)

## 2022-03-02 LAB — GLUCOSE, CAPILLARY
Glucose-Capillary: 124 mg/dL — ABNORMAL HIGH (ref 70–99)
Glucose-Capillary: 225 mg/dL — ABNORMAL HIGH (ref 70–99)
Glucose-Capillary: 281 mg/dL — ABNORMAL HIGH (ref 70–99)
Glucose-Capillary: 285 mg/dL — ABNORMAL HIGH (ref 70–99)
Glucose-Capillary: 74 mg/dL (ref 70–99)
Glucose-Capillary: 77 mg/dL (ref 70–99)
Glucose-Capillary: 81 mg/dL (ref 70–99)

## 2022-03-02 LAB — BPAM RBC
Blood Product Expiration Date: 202306142359
ISSUE DATE / TIME: 202306041252
Unit Type and Rh: 9500

## 2022-03-02 LAB — MAGNESIUM: Magnesium: 2.2 mg/dL (ref 1.7–2.4)

## 2022-03-02 MED ORDER — ZINC OXIDE 11.3 % EX CREA
TOPICAL_CREAM | Freq: Once | CUTANEOUS | Status: AC
  Filled 2022-03-02: qty 56

## 2022-03-02 MED ORDER — ZINC OXIDE 40 % EX OINT
TOPICAL_OINTMENT | Freq: Two times a day (BID) | CUTANEOUS | Status: DC
  Administered 2022-03-02 – 2022-03-05 (×3): 1 via TOPICAL
  Filled 2022-03-02 (×3): qty 57

## 2022-03-02 MED ORDER — DEXTROSE 5 % IV SOLN: INTRAVENOUS | Status: AC

## 2022-03-02 NOTE — Progress Notes (Signed)
Modified Barium Swallow Progress Note  Patient Details  Name: [name] MRN: [id number] Date of Birth: 1940/12/20  Today's Date: 03/02/2022  Modified Barium Swallow completed.  Full report located under Chart Review in the Imaging Section.  Brief recommendations include the following:  Clinical Impression  Patient presents with some improvements in swallow function as per this repeat MBS as compared to MBS on 02/20/22. He continues to exhibit swallow initiation delays at level of vallecular sinus with all consistencies (puree, honey, nectar, regular, barium tablet, thin). With thin liquids, patient had initially trace vallecular residuals and moderate pyriform sinus residuals. Pyriform residuals cleared to minimal amount with 2-3 extra/dry swallows. Patient did exhibit very trace penetration during the swallow with thin liquids and trace to mild penetration from barium flowing out of full pyriform sinus but in all instances, penetrate remained above vocal cords and fully excited laryngeal vestibule. Penetration observed after teh swallow with nectar thick liquids (PAS 2) with no aspiration and full clearance of penetrate from laryngeal vestibule. No aspiration obseved prior to, during or after the swallow with any of the tested barium consistencies. Vallecular residuals were trace to mild in amount for thin liquid, nectar thick, honey thick, puree and regular solids; pyriform sinus residuals were moderate in amount for thin liquid and mild for nectar, honey, puree and regular solids. Barium tablet (1/2 of a 71m tablet) became lodged in vallecular sinus and despite multiple trials, was not able to be cleared. Overall, patient appears to be protecting his airway better during the swallow and is able to more effectively clear pharyngeal residuals with extra/dry swallows. He will continue to be at risk for aspiration due to pyriform sinus residuals but aspiration risk can be reduced by using strategies of  swallowing an extra 2-3 times for each bite/sip, fully masticating food before swallowing, and sitting upright during and after meals.   Swallow Evaluation Recommendations       SLP Diet Recommendations: Dysphagia 3 (Mech soft) solids;Thin liquid   Liquid Administration via: Cup;Straw   Medication Administration: Crushed with puree   Supervision: Patient able to self feed;Full supervision/cueing for compensatory strategies   Compensations: Slow rate;Small sips/bites   Postural Changes: Seated upright at 90 degrees;Remain semi-upright after after feeds/meals (Comment) (30-45 minutes)   Oral Care Recommendations: Oral care BID        JSonia Baller MA, CCC-SLP Speech Therapy

## 2022-03-02 NOTE — Consult Note (Signed)
WOC Nurse Consult Note: Patient receiving care in [facility] 1430. Spouse present. Reason for Consult: IAD Wound type: Irritant dermatitis L24A2 - Due to fecal, urinary or dual incontinence. Flexiseal and male PrimoFit in use. Pressure Injury POA: Yes/No/NA Measurement: Wound bed: pink Drainage (amount, consistency, odor) none Periwound: intact Dressing procedure/placement/frequency: I modified the existing 40% Desitin order to reflect applying it twice daily and prn.  [facility] nurse will not follow at this time.  Please re-consult the [facility] team if needed.  Val Riles, RN, MSN, CWOCN, CNS-BC, pager [phone]

## 2022-03-02 NOTE — Progress Notes (Signed)
Physical Therapy Treatment Patient Details Name: [name] MRN: [id number] DOB: 1940-11-13 Today's Date: 03/02/2022   History of Present Illness 81 year old male with history of CLL initially diagnosed in 2014, status postchemotherapy and currently on Imbruvica, DM 2, HTN, colorectal cancer 2009, who came into the hospital with progressive and generalized weakness and admitted 02/19/22 with Bacteremia due to Enterococcus    PT Comments    Progressing with mobility. Flexiseal dislodged during session-made RN aware/situation assessed. Will continue to follow and progress activity as tolerated.    Recommendations for follow up therapy are one component of a multi-disciplinary discharge planning process, led by the attending physician.  Recommendations may be updated based on patient status, additional functional criteria and insurance authorization.  Follow Up Recommendations  Home health PT     Assistance Recommended at Discharge Intermittent Supervision/Assistance  Patient can return home with the following A little help with walking and/or transfers;A little help with bathing/dressing/bathroom;Assistance with cooking/housework;Assist for transportation   Equipment Recommendations  None recommended by PT    Recommendations for Other Services       Precautions / Restrictions Precautions Precautions: Fall Precaution Comments: feeding tube, flexiseal (dislodged accidentally 6/5) Restrictions Weight Bearing Restrictions: No     Mobility  Bed Mobility Overal bed mobility: Modified Independent             General bed mobility comments: increased time and effort, use of bedrails to self assist    Transfers Overall transfer level: Needs assistance Equipment used: Rolling walker (2 wheels) Transfers: Sit to/from Stand Sit to Stand: Min assist           General transfer comment: Assist to steady. Cues for safety. Pt powered up on his own     Ambulation/Gait Ambulation/Gait assistance: Min assist Gait Distance (Feet): 40 Feet Assistive device: Rolling walker (2 wheels) Gait Pattern/deviations: Step-through pattern, Decreased stride length       General Gait Details: Intermittent assist to steady. Pt tolerated distance well. Flexiseal became disodged during walk.   Stairs             Wheelchair Mobility    Modified Rankin (Stroke Patients Only)       Balance Overall balance assessment: Needs assistance         Standing balance support: During functional activity, Bilateral upper extremity supported, Reliant on assistive device for balance Standing balance-Leahy Scale: Poor                              Cognition Arousal/Alertness: Awake/alert Behavior During Therapy: WFL for tasks assessed/performed Overall Cognitive Status: Within Functional Limits for tasks assessed                                 General Comments: pt  pleasant and motivated        Exercises      General Comments        Pertinent Vitals/Pain Pain Assessment Pain Assessment: No/denies pain    Home Living                          Prior Function            PT Goals (current goals can now be found in the care plan section) Progress towards PT goals: Progressing toward goals    Frequency    Min 3X/week  PT Plan Current plan remains appropriate    Co-evaluation              AM-PAC PT "6 Clicks" Mobility   Outcome Measure  Help needed turning from your back to your side while in a flat bed without using bedrails?: A Little Help needed moving from lying on your back to sitting on the side of a flat bed without using bedrails?: A Little Help needed moving to and from a bed to a chair (including a wheelchair)?: A Little Help needed standing up from a chair using your arms (e.g., wheelchair or bedside chair)?: A Little Help needed to walk in hospital room?: A  Little Help needed climbing 3-5 steps with a railing? : A Little 6 Click Score: 18    End of Session Equipment Utilized During Treatment: Gait belt Activity Tolerance: Patient tolerated treatment well Patient left: in bed;with call bell/phone within reach;with bed alarm set   PT Visit Diagnosis: Difficulty in walking, not elsewhere classified (R26.2);Muscle weakness (generalized) (M62.81)     Time: 1130-1150 PT Time Calculation (min) (ACUTE ONLY): 20 min  Charges:  $Gait Training: 8-22 mins                         Doreatha Massed, PT Acute Rehabilitation  Office: [phone] Pager: [phone]

## 2022-03-02 NOTE — Progress Notes (Signed)
PROGRESS NOTE    [name]  OIN:[id number] DOB: 12-23-1940 DOA: 02/19/2022 PCP: [name], MD   Brief Narrative:  81 year old male with history of CLL initially diagnosed in 2014, status postchemotherapy and currently on Imbruvica, DM 2, HTN, colorectal cancer 2009, who came into the hospital with progressive and generalized weakness.  Patient has been experiencing some weight loss and poor appetite for the past several months, was seen by GI in February as an outpatient and underwent work-up without any significant major findings.  He was taken off Imbruvica in April as it was believed that it may be related to that, however he continued to decline.  He was hospitalized just 2 weeks ago for dehydration, and when he left the hospital he was placed back on his Imbruvica.  While at home he continued to decline and was rehospitalized on 5/25.  Repeat CT scan of the abdomen and pelvis showed recurrence of the CLL, he was also found to be neutropenic, have a fever and have Enterococcus bacteremia.  Oncology and ID consulted.  Palliative care has also been consulted for goals of care discussion.  Nephrology was consulted on 02/27/2022 for worsening creatinine.  Assessment & Plan:   Sepsis: Present on admission Enterococcus faecium bacteremia Neutropenic fever -2D echo showed normal EF of 60 to 65%, small pericardial effusion,  there was also a nodular calcification in the right coronary cusp likely sclerosis but if high suspicion for SBE, consider TEE.  ID following and recommending TEE.  Cardiology was the opinion that patient is high risk for TEE.  Patient has opted to not proceed with TEE.  ID recommends to continue ampicillin 2 weeks from 02/21/2022 (end date 03/07/2022): If discharged earlier, can transition to Zyvox to finish the course repeat TTE in around 2 weeks.  Outpatient follow-up with ID -currently on ampicillin.  No temperature spikes recently.  Blood pressure stable. -Repeat blood  cultures from 02/21/2022 are negative so far  Goals of care Failure to thrive Dehydration/dysphagia/chronic cough CLL Physical deconditioning -overall difficult situation and complex case given progression of his CLL with progressive lymphadenopathy and now complicated by persistent neutropenia and Enterococcus bacteremia with concern for infectious endocarditis.   -Palliative care following -Oncology following -Cortrak had to be removed on 02/28/2022 because of malposition which did not improve despite trying to reposition it by the nursing staff.  He has not made up his mind about PEG tube placement/feeding. SLP still recommends n.p.o. except ice chips.  SLP will reevaluate the patient  today with MBS.  Dietitian also to reevaluate him today and place cortrak tube as needed -Dietitian following  Acute kidney injury on CKD stage IIIb -Baseline creatinine 1.4-1.6.  3.26 today.   -Renal ultrasound negative for hydronephrosis.  Repeat a.m. labs.   -Nephrology following: Diuretics discontinued by nephrology and patient was given albumin.  Patient is not a candidate for dialysis.  If renal function continues to worsen, she would be appropriate for hospice/comfort measures  Diarrhea -Possibly from tube feeds and antibiotic use.  Imodium as needed.  Patient currently has a rectal tube.  Pancytopenia -Due to malignancy.  Patient received a few doses of Granix as per oncology recommendations for neutropenic fever.  Hemoglobin 6.9 on 03/01/2022.  Status post 1 unit packed red cells transfusion on 03/01/2022.  Hemoglobin 8.7 today.  Hyperlipidemia -Continue statin  Essential hypertension  -Continue IV metoprolol as needed.  Oral metoprolol on hold for now because of patient being n.p.o.   Amlodipine discontinued on 02/27/2022 by  nephrology  Hyperlipidemia -Continue statin once patient is started on a diet  Diabetes mellitus type 2 with hyperglycemia -Blood sugars are currently on the lower side.   Discontinued long-acting insulin and NovoLog.  Continue CBGs with SSI.  Severe protein calorie malnutrition/hypoalbuminemia -Dietitian following.  Plan as above  DVT prophylaxis: We will resume heparin  code Status: Full Family Communication: Wife at bedside Disposition Plan: Status is: Inpatient Remains inpatient appropriate because: Of severity of illness  Consultants: ID/oncology/palliative care/nephrology  Procedures: 2D echo  Antimicrobials:  Anti-infectives (From admission, onward)    Start     Dose/Rate Route Frequency Ordered Stop   02/23/22 1900  ampicillin (OMNIPEN) 2 g in sodium chloride 0.9 % 100 mL IVPB        2 g 300 mL/hr over 20 Minutes Intravenous Every 8 hours 02/23/22 1059 03/07/22 2359   02/21/22 1000  ampicillin (OMNIPEN) 2 g in sodium chloride 0.9 % 100 mL IVPB  Status:  Discontinued        2 g 300 mL/hr over 20 Minutes Intravenous Every 6 hours 02/21/22 0940 02/23/22 1059   02/21/22 0300  ampicillin (OMNIPEN) 2 g in sodium chloride 0.9 % 100 mL IVPB  Status:  Discontinued        2 g 300 mL/hr over 20 Minutes Intravenous Every 8 hours 02/21/22 0215 02/21/22 0940   02/20/22 0815  vancomycin (VANCOREADY) IVPB 1250 mg/250 mL  Status:  Discontinued        1,250 mg 166.7 mL/hr over 90 Minutes Intravenous Every 48 hours 02/20/22 0715 02/21/22 0215   02/20/22 0800  ceFEPIme (MAXIPIME) 2 g in sodium chloride 0.9 % 100 mL IVPB  Status:  Discontinued        2 g 200 mL/hr over 30 Minutes Intravenous Every 24 hours 02/20/22 0643 02/21/22 0215   02/20/22 0800  vancomycin (VANCOREADY) IVPB 1750 mg/350 mL  Status:  Discontinued        1,750 mg 175 mL/hr over 120 Minutes Intravenous Every 48 hours 02/20/22 0703 02/20/22 0715        Subjective: Patient seen and examined at bedside.  Poor historian.  No fever, agitation, vomiting, seizures reported.   Objective: Vitals:   03/01/22 2036 03/02/22 0046 03/02/22 0443 03/02/22 0500  BP: 139/64 (!) 144/65 (!) 144/67    Pulse: (!) 109 (!) 106 (!) 112   Resp: '19 17 17   '$ Temp: 98.5 F (36.9 C) 98.2 F (36.8 C) 98.1 F (36.7 C)   TempSrc: Oral Oral Oral   SpO2: 100% 100% 100%   Weight:    64.2 kg  Height:        Intake/Output Summary (Last 24 hours) at 03/02/2022 0823 Last data filed at 03/02/2022 0300 Gross per 24 hour  Intake 875.97 ml  Output 1250 ml  Net -374.03 ml    Filed Weights   02/28/22 0338 03/01/22 0500 03/02/22 0500  Weight: 65 kg 65 kg 64.2 kg    Examination:  General: Still on room air.  No distress.  Chronically ill and deconditioned looking. ENT/neck: No elevated JVD.  No palpable thyromegaly noted  respiratory: Bilateral decreased breath sounds at bases with some crackles  CVS: S1-S2 heard; tachycardic  abdominal: Soft, nontender, slightly distended; no organomegaly, bowel sounds are heard  extremities: No clubbing; mild lower extremity edema present  CNS: Awake; still very slow to respond.  Poor historian.  No focal neurologic deficit.  Moving extremities  lymph: No palpable lymphadenopathy noted  skin: No obvious ecchymosis/lesions  psych: Not agitated currently.  Flat affect noted  musculoskeletal: No obvious joint swelling/deformity    Data Reviewed: I have personally reviewed following labs and imaging studies  CBC: Recent Labs  Lab 02/26/22 0420 02/27/22 0352 02/28/22 0439 03/01/22 0358 03/01/22 1811 03/02/22 0357  WBC 2.3* 2.9* 3.1* 1.8*  --  2.2*  NEUTROABS 1.4* 1.7 1.6* 0.8*  --  1.1*  HGB 7.7* 7.5* 8.0* 6.9* 8.8* 8.7*  HCT 25.0* 24.4* 26.4* 22.4* 27.1* 27.3*  MCV 86.5 85.6 88.6 85.8  --  87.2  PLT 91* 91* 102* 90*  --  106*    Basic Metabolic Panel: Recent Labs  Lab 02/23/22 1722 02/24/22 0329 02/24/22 1723 02/25/22 0424 02/26/22 0420 02/27/22 0352 02/28/22 0439 03/01/22 0358 03/02/22 0357  NA  --  138  --    < > 139 138 138 142 147*  K  --  4.0  --    < > 4.1 4.3 4.6 4.7 4.9  CL  --  108  --    < > 112* 113* 113* 117* 122*  CO2  --   21*  --    < > 21* 20* 19* 21* 20*  GLUCOSE  --  304*  --    < > 239* 179* 214* 105* 82  BUN  --  61*  --    < > 83* 86* 88* 86* 79*  CREATININE  --  2.56*  --    < > 3.11* 3.22* 3.59* 3.45* 3.26*  CALCIUM  --  7.1*  --    < > 6.7* 6.4* 6.7* 7.2* 8.1*  MG 1.9 2.0 1.9   < > 1.9 2.0 2.1 2.1 2.2  PHOS 2.7 2.4* 1.6*  --   --   --   --   --   --    < > = values in this interval not displayed.    GFR: Estimated Creatinine Clearance: 16.7 mL/min (A) (by C-G formula based on SCr of 3.26 mg/dL (H)). Liver Function Tests: Recent Labs  Lab 02/25/22 0424 02/27/22 0352 03/01/22 0358  AST '30 23 22  '$ ALT '27 26 22  '$ ALKPHOS 106 109 95  BILITOT 0.5 0.6 0.7  PROT 4.0* 3.7* 4.1*  ALBUMIN 1.7* 1.5* 2.3*    No results for input(s): LIPASE, AMYLASE in the last 168 hours. No results for input(s): AMMONIA in the last 168 hours. Coagulation Profile: No results for input(s): INR, PROTIME in the last 168 hours. Cardiac Enzymes: No results for input(s): CKTOTAL, CKMB, CKMBINDEX, TROPONINI in the last 168 hours. BNP (last 3 results) No results for input(s): PROBNP in the last 8760 hours. HbA1C: No results for input(s): HGBA1C in the last 72 hours. CBG: Recent Labs  Lab 03/01/22 1641 03/01/22 1943 03/02/22 0002 03/02/22 0411 03/02/22 0802  GLUCAP 75 76 74 81 77    Lipid Profile: No results for input(s): CHOL, HDL, LDLCALC, TRIG, CHOLHDL, LDLDIRECT in the last 72 hours. Thyroid Function Tests: No results for input(s): TSH, T4TOTAL, FREET4, T3FREE, THYROIDAB in the last 72 hours. Anemia Panel: No results for input(s): VITAMINB12, FOLATE, FERRITIN, TIBC, IRON, RETICCTPCT in the last 72 hours. Sepsis Labs: No results for input(s): PROCALCITON, LATICACIDVEN in the last 168 hours.   Recent Results (from the past 240 hour(s))  Culture, blood (Routine X 2) w Reflex to ID Panel     Status: None   Collection Time: 02/21/22  6:32 AM   Specimen: BLOOD  Result Value Ref Range Status   Specimen  Description   Final    BLOOD BLOOD RIGHT FOREARM Performed at [facility] [address], [place], [place] [address]    Special Requests   Final    BOTTLES DRAWN AEROBIC ONLY Blood Culture adequate volume Performed at [facility] [address], [place], [place] [address]    Culture   Final    NO GROWTH 5 DAYS Performed at [facility], [place] [address], [place], [place] [address]    Report Status 02/26/2022 FINAL  Final  Culture, blood (Routine X 2) w Reflex to ID Panel     Status: None   Collection Time: 02/21/22  6:32 AM   Specimen: BLOOD  Result Value Ref Range Status   Specimen Description   Final    BLOOD BLOOD RIGHT HAND Performed at [facility] [address], [place], [place] [address]    Special Requests   Final    IN PEDIATRIC BOTTLE Blood Culture results may not be optimal due to an inadequate volume of blood received in culture bottles Performed at [facility] [address], [place], [place] [address]    Culture   Final    NO GROWTH 5 DAYS Performed at [facility], [place] [address], [place], [place] [address]    Report Status 02/26/2022 FINAL  Final          Radiology Studies: DG Abd 1 View  Result Date: 02/28/2022 CLINICAL DATA:  Feeding tube placement. EXAM: ABDOMEN - 1 VIEW COMPARISON:  Prior today FINDINGS: Feeding tube remains unchanged in position, with tip in the region of the GE junction. No evidence of dilated bowel loops. IMPRESSION: Feeding tube tip remains near the GE junction. Electronically Signed   By: Marlaine Hind M.D.   On: 02/28/2022 15:07   DG Abd Portable 1V  Result Date: 02/28/2022 CLINICAL DATA:  Feeding tube placement. EXAM: PORTABLE ABDOMEN - 1 VIEW COMPARISON:  None Available. FINDINGS: A feeding tube is now seen with tip overlying the GE junction. No evidence of dilated bowel loops IMPRESSION: Feeding tube tip overlies the GE junction.  Electronically Signed   By: Marlaine Hind M.D.   On: 02/28/2022 13:01        Scheduled Meds:  atorvastatin  40 mg Per Tube Daily   chlorhexidine  15 mL Mouth Rinse BID   feeding supplement (PROSource TF)  45 mL Per Tube BID   fiber  1 packet Per Tube TID   free water  150 mL Per Tube Q4H   guaiFENesin-dextromethorphan  5 mL Per Tube Q8H   insulin aspart  0-9 Units Subcutaneous Q4H   metoprolol tartrate  12.5 mg Per Tube BID   mirtazapine  7.5 mg Per Tube QHS   multivitamin with minerals  1 tablet Per Tube Daily   ondansetron  4 mg Per Tube Q8H   Or   ondansetron (ZOFRAN) IV  4 mg Intravenous Q8H   pantoprazole sodium  40 mg Per Tube BID   zinc sulfate  220 mg Per Tube Daily   Continuous Infusions:  sodium chloride Stopped (02/20/22 0850)   albumin human 25 g (03/01/22 2300)   ampicillin (OMNIPEN) IV 2 g (03/02/22 0300)   feeding supplement (OSMOLITE 1.5 CAL) Stopped (02/28/22 1030)          Aline August, MD [facility] 03/02/2022, 8:23 AM

## 2022-03-02 NOTE — Progress Notes (Signed)
Patient ID: [name], male   DOB: 02/17/1941, 81 y.o.   MRN: [id number] [facility] Progress Note   Assessment/ Plan:   1. Acute kidney Injury on chronic kidney disease stage IIIb: Baseline creatinine ranging 1.7-2.5 with suspicion that current acute kidney injury may be ATN in the setting of E faecium bacteremia/SIRS.  Nonoliguric overnight and with slight improvement of creatinine.  With rising sodium level, suspect element of dehydration compounded by his dysphagia.  I will start him on hypotonic fluid to help replace water losses. 2.  E. faecium bacteremia/neutropenic fever: On intravenous ampicillin, afebrile and normotensive overnight but noted to be tachycardic. 3.  CLL: Unfortunately with clinical progression and currently on palliative care measures. 4.  Pancytopenia: Secondary to CLL, no overt blood loss  Subjective:   Reports to be feeling fair, just came back from modified barium swallow.   Objective:   BP (!) 151/66 (BP Location: Left Arm)   Pulse (!) 109   Temp 98.1 F (36.7 C) (Oral)   Resp 17   Ht '5\' 7"'$  (1.702 m)   Wt 64.2 kg   SpO2 100%   BMI 22.17 kg/m   Intake/Output Summary (Last 24 hours) at 03/02/2022 1201 Last data filed at 03/02/2022 0300 Gross per 24 hour  Intake 875.97 ml  Output 1250 ml  Net -374.03 ml   Weight change: -0.8 kg  Physical Exam: Gen: Comfortably resting in bed, appears chronically ill CVS: Pulse regular rhythm, tachycardia, S1 and S2 normal Resp: Distant breath sounds bilaterally, no distinct rales or rhonchi Abd: Soft, flat, nontender, bowel sounds normal Ext: 2+ bilateral upper extremity and lower extremity edema.  Imaging: DG Abd 1 View  Result Date: 02/28/2022 CLINICAL DATA:  Feeding tube placement. EXAM: ABDOMEN - 1 VIEW COMPARISON:  Prior today FINDINGS: Feeding tube remains unchanged in position, with tip in the region of the GE junction. No evidence of dilated bowel loops. IMPRESSION: Feeding tube tip remains  near the GE junction. Electronically Signed   By: Marlaine Hind M.D.   On: 02/28/2022 15:07   DG Abd Portable 1V  Result Date: 02/28/2022 CLINICAL DATA:  Feeding tube placement. EXAM: PORTABLE ABDOMEN - 1 VIEW COMPARISON:  None Available. FINDINGS: A feeding tube is now seen with tip overlying the GE junction. No evidence of dilated bowel loops IMPRESSION: Feeding tube tip overlies the GE junction. Electronically Signed   By: Marlaine Hind M.D.   On: 02/28/2022 13:01    Labs: BMET Recent Labs  Lab 02/23/22 1722 02/24/22 0329 02/24/22 1723 02/25/22 0424 02/26/22 0420 02/27/22 0352 02/28/22 0439 03/01/22 0358 03/02/22 0357  NA  --  138  --  139 139 138 138 142 147*  K  --  4.0  --  4.0 4.1 4.3 4.6 4.7 4.9  CL  --  108  --  110 112* 113* 113* 117* 122*  CO2  --  21*  --  19* 21* 20* 19* 21* 20*  GLUCOSE  --  304*  --  221* 239* 179* 214* 105* 82  BUN  --  61*  --  67* 83* 86* 88* 86* 79*  CREATININE  --  2.56*  --  2.61* 3.11* 3.22* 3.59* 3.45* 3.26*  CALCIUM  --  7.1*  --  6.7* 6.7* 6.4* 6.7* 7.2* 8.1*  PHOS 2.7 2.4* 1.6*  --   --   --   --   --   --    CBC Recent Labs  Lab 02/27/22 0352  02/28/22 0439 03/01/22 0358 03/01/22 1811 03/02/22 0357  WBC 2.9* 3.1* 1.8*  --  2.2*  NEUTROABS 1.7 1.6* 0.8*  --  1.1*  HGB 7.5* 8.0* 6.9* 8.8* 8.7*  HCT 24.4* 26.4* 22.4* 27.1* 27.3*  MCV 85.6 88.6 85.8  --  87.2  PLT 91* 102* 90*  --  106*   Medications:     atorvastatin  40 mg Per Tube Daily   chlorhexidine  15 mL Mouth Rinse BID   feeding supplement (PROSource TF)  45 mL Per Tube BID   fiber  1 packet Per Tube TID   free water  150 mL Per Tube Q4H   guaiFENesin-dextromethorphan  5 mL Per Tube Q8H   insulin aspart  0-9 Units Subcutaneous Q4H   liver oil-zinc oxide   Topical BID   metoprolol tartrate  12.5 mg Per Tube BID   mirtazapine  7.5 mg Per Tube QHS   multivitamin with minerals  1 tablet Per Tube Daily   ondansetron  4 mg Per Tube Q8H   Or   ondansetron (ZOFRAN) IV  4  mg Intravenous Q8H   pantoprazole sodium  40 mg Per Tube BID   zinc sulfate  220 mg Per Tube Daily   Elmarie Shiley, MD 03/02/2022, 12:01 PM

## 2022-03-03 LAB — RENAL FUNCTION PANEL
Albumin: 2.9 g/dL — ABNORMAL LOW (ref 3.5–5.0)
Anion gap: 8 (ref 5–15)
BUN: 65 mg/dL — ABNORMAL HIGH (ref 8–23)
CO2: 20 mmol/L — ABNORMAL LOW (ref 22–32)
Calcium: 8.1 mg/dL — ABNORMAL LOW (ref 8.9–10.3)
Chloride: 119 mmol/L — ABNORMAL HIGH (ref 98–111)
Creatinine, Ser: 2.9 mg/dL — ABNORMAL HIGH (ref 0.61–1.24)
GFR, Estimated: 21 mL/min — ABNORMAL LOW (ref >60)
Glucose, Bld: 144 mg/dL — ABNORMAL HIGH (ref 70–99)
Phosphorus: 3.8 mg/dL (ref 2.5–4.6)
Potassium: 4.3 mmol/L (ref 3.5–5.1)
Sodium: 147 mmol/L — ABNORMAL HIGH (ref 135–145)

## 2022-03-03 LAB — CBC WITH DIFFERENTIAL/PLATELET
Abs Immature Granulocytes: 0.14 10*3/uL — ABNORMAL HIGH (ref 0.00–0.07)
Basophils Absolute: 0 10*3/uL (ref 0.0–0.1)
Basophils Relative: 1 %
Eosinophils Absolute: 0 10*3/uL (ref 0.0–0.5)
Eosinophils Relative: 2 %
HCT: 26 % — ABNORMAL LOW (ref 39.0–52.0)
Hemoglobin: 8 g/dL — ABNORMAL LOW (ref 13.0–17.0)
Immature Granulocytes: 7 %
Lymphocytes Relative: 32 %
Lymphs Abs: 0.6 10*3/uL — ABNORMAL LOW (ref 0.7–4.0)
MCH: 27.3 pg (ref 26.0–34.0)
MCHC: 30.8 g/dL (ref 30.0–36.0)
MCV: 88.7 fL (ref 80.0–100.0)
Monocytes Absolute: 0.1 10*3/uL (ref 0.1–1.0)
Monocytes Relative: 7 %
Neutro Abs: 1 10*3/uL — ABNORMAL LOW (ref 1.7–7.7)
Neutrophils Relative %: 51 %
Platelets: 104 10*3/uL — ABNORMAL LOW (ref 150–400)
RBC: 2.93 MIL/uL — ABNORMAL LOW (ref 4.22–5.81)
RDW: 17.1 % — ABNORMAL HIGH (ref 11.5–15.5)
WBC: 2 10*3/uL — ABNORMAL LOW (ref 4.0–10.5)
nRBC: 0 % (ref 0.0–0.2)

## 2022-03-03 LAB — GLUCOSE, CAPILLARY
Glucose-Capillary: 143 mg/dL — ABNORMAL HIGH (ref 70–99)
Glucose-Capillary: 155 mg/dL — ABNORMAL HIGH (ref 70–99)
Glucose-Capillary: 177 mg/dL — ABNORMAL HIGH (ref 70–99)
Glucose-Capillary: 206 mg/dL — ABNORMAL HIGH (ref 70–99)
Glucose-Capillary: 216 mg/dL — ABNORMAL HIGH (ref 70–99)
Glucose-Capillary: 219 mg/dL — ABNORMAL HIGH (ref 70–99)

## 2022-03-03 LAB — MAGNESIUM: Magnesium: 2 mg/dL (ref 1.7–2.4)

## 2022-03-03 MED ORDER — PANTOPRAZOLE SODIUM 40 MG PO TBEC
40.0000 mg | DELAYED_RELEASE_TABLET | Freq: Two times a day (BID) | ORAL | Status: DC
  Administered 2022-03-03 – 2022-03-08 (×11): 40 mg via ORAL
  Filled 2022-03-03 (×11): qty 1

## 2022-03-03 MED ORDER — MIRTAZAPINE 15 MG PO TABS
7.5000 mg | ORAL_TABLET | Freq: Every day | ORAL | Status: DC
  Administered 2022-03-03 – 2022-03-07 (×5): 7.5 mg via ORAL
  Filled 2022-03-03 (×5): qty 1

## 2022-03-03 MED ORDER — ADULT MULTIVITAMIN W/MINERALS CH
1.0000 | ORAL_TABLET | Freq: Every day | ORAL | Status: DC
  Administered 2022-03-03 – 2022-03-08 (×6): 1 via ORAL
  Filled 2022-03-03 (×6): qty 1

## 2022-03-03 MED ORDER — METOPROLOL TARTRATE 25 MG PO TABS
12.5000 mg | ORAL_TABLET | Freq: Two times a day (BID) | ORAL | Status: DC
  Administered 2022-03-03 – 2022-03-08 (×11): 12.5 mg via ORAL
  Filled 2022-03-03 (×11): qty 1

## 2022-03-03 MED ORDER — ATORVASTATIN CALCIUM 40 MG PO TABS
40.0000 mg | ORAL_TABLET | Freq: Every day | ORAL | Status: DC
  Administered 2022-03-03 – 2022-03-08 (×6): 40 mg via ORAL
  Filled 2022-03-03 (×6): qty 1

## 2022-03-03 MED ORDER — ACETAMINOPHEN 325 MG PO TABS
650.0000 mg | ORAL_TABLET | Freq: Four times a day (QID) | ORAL | Status: DC | PRN
Start: 2022-03-03 — End: 2022-03-08

## 2022-03-03 MED ORDER — ZINC SULFATE 220 (50 ZN) MG PO CAPS
220.0000 mg | ORAL_CAPSULE | Freq: Every day | ORAL | Status: DC
  Administered 2022-03-03 – 2022-03-08 (×6): 220 mg via ORAL
  Filled 2022-03-03 (×6): qty 1

## 2022-03-03 MED ORDER — ONDANSETRON HCL 4 MG/2ML IJ SOLN: 4.0000 mg | Freq: Three times a day (TID) | INTRAMUSCULAR | Status: DC

## 2022-03-03 MED ORDER — ONDANSETRON HCL 4 MG PO TABS
4.0000 mg | ORAL_TABLET | Freq: Three times a day (TID) | ORAL | Status: DC
  Administered 2022-03-03 – 2022-03-08 (×15): 4 mg via ORAL
  Filled 2022-03-03 (×15): qty 1

## 2022-03-03 MED ORDER — GUAIFENESIN-DM 100-10 MG/5ML PO SYRP
5.0000 mL | ORAL_SOLUTION | Freq: Three times a day (TID) | ORAL | Status: DC
  Administered 2022-03-03 – 2022-03-08 (×16): 5 mL via ORAL
  Filled 2022-03-03 (×16): qty 10

## 2022-03-03 MED ORDER — DEXTROSE 5 % IV SOLN: INTRAVENOUS | Status: AC

## 2022-03-03 MED ORDER — NUTRISOURCE FIBER PO PACK
1.0000 | PACK | Freq: Three times a day (TID) | ORAL | Status: DC
Start: 2022-03-03 — End: 2022-03-04
  Administered 2022-03-03 (×2): 1 via ORAL
  Filled 2022-03-03 (×2): qty 1

## 2022-03-03 MED ORDER — LOPERAMIDE HCL 2 MG PO CAPS
2.0000 mg | ORAL_CAPSULE | Freq: Four times a day (QID) | ORAL | Status: DC | PRN
Start: 2022-03-03 — End: 2022-03-08
  Administered 2022-03-04: 2 mg via ORAL
  Filled 2022-03-03: qty 1

## 2022-03-03 MED ORDER — HEPARIN SODIUM (PORCINE) 5000 UNIT/ML IJ SOLN
5000.0000 [IU] | Freq: Three times a day (TID) | INTRAMUSCULAR | Status: DC
  Administered 2022-03-03 – 2022-03-08 (×15): 5000 [IU] via SUBCUTANEOUS
  Filled 2022-03-03 (×15): qty 1

## 2022-03-03 NOTE — Progress Notes (Signed)
Occupational Therapy Treatment Patient Details Name: [name] MRN: [id number] DOB: 02-Jul-1941 Today's Date: 03/03/2022   History of present illness 81 year old male with history of CLL initially diagnosed in 2014, status postchemotherapy and currently on Imbruvica, DM 2, HTN, colorectal cancer 2009, who came into the hospital with progressive and generalized weakness and admitted 02/19/22 with Bacteremia due to Enterococcus   OT comments  Patient progressing and showed improved standing balance for BUE ADLs such as hand hygiene with supervision for balance, compared to previous session. Pt stood from [facility] with supervision only and able to complete his peri hygiene in standing with Min guard to supervision. Patient remains limited by  generalized weakness and decreased activity tolerance with pt rating an RPE of 7/10 after 1 UE theraband exercise and 8-9/10 effort after 3 exercises while seated at EOB, along with deficits noted below. Pt continues to demonstrate good rehab potential and would benefit from continued skilled OT to increase safety and independence with ADLs and functional transfers to allow pt to return home safely and reduce caregiver burden and fall risk.    Recommendations for follow up therapy are one component of a multi-disciplinary discharge planning process, led by the attending physician.  Recommendations may be updated based on patient status, additional functional criteria and insurance authorization.    Follow Up Recommendations  Home health OT    Assistance Recommended at Discharge Frequent or constant Supervision/Assistance  Patient can return home with the following  A little help with walking and/or transfers;A little help with bathing/dressing/bathroom;Assist for transportation;Assistance with cooking/housework;Help with stairs or ramp for entrance;Direct supervision/assist for medications management   Equipment Recommendations  BSC/3in1    Recommendations for  Other Services      Precautions / Restrictions Precautions Precautions: Fall Restrictions Weight Bearing Restrictions: No       Mobility Bed Mobility                    Transfers                         Balance Overall balance assessment: Needs assistance Sitting-balance support: No upper extremity supported, Feet supported Sitting balance-Leahy Scale: Good     Standing balance support: During functional activity, Reliant on assistive device for balance, No upper extremity supported Standing balance-Leahy Scale: Fair Standing balance comment: Able to wash hands at sink.                           ADL either performed or assessed with clinical judgement   ADL Overall ADL's : Needs assistance/impaired Eating/Feeding: Set up Eating/Feeding Details (indicate cue type and reason): Cues to raise [facility] for sipping ice water. Grooming: Standing;Wash/dry hands;Supervision/safety Grooming Details (indicate cue type and reason): Pt brought RW to sink correctly and performed hand hygiene with supervision for balance.                 Toilet Transfer: Supervision/safety;BSC/3in1;Stand-pivot;Rolling walker (2 wheels) Toilet Transfer Details (indicate cue type and reason): Pt seated on BSC as OT eneterd room and stated he was ready for hgyiene. Pt stood from [facility] to [place] with supervision. Toileting- Clothing Manipulation and Hygiene: Sit to/from stand;Min guard;Set up [place] Manipulation Details (indicate cue type and reason): Pt performed peri hgiene with Min guard for balance and setup of washclothes. OT applied zinc ointment.     Functional mobility during ADLs: Supervision/safety;Min guard;Rolling walker (2 wheels) General ADL  Comments: Pt stood from [facility] as above, pivoted with RW to sink with supervision, pivoted to EOB with supervision. Stand to sit to EOB with supervision. Latearl scooting along EOB Mod I. Sit to supine: supervision with HOB  elevated and use of bed rails.    Extremity/Trunk Assessment Upper Extremity Assessment Upper Extremity Assessment: Generalized weakness   Lower Extremity Assessment Lower Extremity Assessment: Generalized weakness   Cervical / Trunk Assessment Cervical / Trunk Assessment: Normal    Vision   Vision Assessment?: No apparent visual deficits   Perception     Praxis      Cognition Arousal/Alertness: Awake/alert Behavior During Therapy: WFL for tasks assessed/performed Overall Cognitive Status: Within Functional Limits for tasks assessed                                 General Comments: pt  pleasant and motivated        Exercises Other Exercises Other Exercises: EOB UE HEP with green Theraband: Pt completed 10 reps of 3: chest pull, tricep push and bicep curls. Pt reported an RPE of 7 after 1 exercise and RPE of 8-9 after all 3 exercises with HR at 111. Pt instructed on pacing and to give rest breaks as needed while exercising. Other Exercises: Pt provided with energy conservation handout and main points discussed.    Shoulder Instructions       General Comments      Pertinent Vitals/ Pain       Pain Assessment Pain Assessment: No/denies pain  Home Living                                          Prior Functioning/Environment              Frequency  Min 2X/week        Progress Toward Goals  OT Goals(current goals can now be found in the care plan section)  Progress towards OT goals: Progressing toward goals  Acute Rehab OT Goals Patient Stated Goal: get stronger OT Goal Formulation: With patient Time For Goal Achievement: 03/10/22 Potential to Achieve Goals: Good  Plan Discharge plan remains appropriate    Co-evaluation                 AM-PAC OT "6 Clicks" Daily Activity     Outcome Measure   Help from another person eating meals?: None Help from another person taking care of personal grooming?: A  Little Help from another person toileting, which includes using toliet, bedpan, or urinal?: A Little Help from another person bathing (including washing, rinsing, drying)?: A Little Help from another person to put on and taking off regular upper body clothing?: A Little Help from another person to put on and taking off regular lower body clothing?: A Little 6 Click Score: 19    End of Session Equipment Utilized During Treatment: Rolling walker (2 wheels)  OT Visit Diagnosis: Unsteadiness on feet (R26.81);History of falling (Z91.81);Muscle weakness (generalized) (M62.81)   Activity Tolerance Patient tolerated treatment well   Patient Left in bed;with call bell/phone within reach;with bed alarm set   Nurse Communication Other (comment);Mobility status (RN to room to offer assistance.)        Time: [id number] OT Time Calculation (min): 23 min  Charges: OT General Charges $OT Visit: 1 Visit OT Treatments $Self  Care/Home Management : 8-22 mins $Therapeutic Exercise: 8-22 mins  Anderson Malta, OT Acute Rehab Services Office: [phone] 03/03/2022  Julien Girt 03/03/2022, 9:54 AM

## 2022-03-03 NOTE — Progress Notes (Signed)
Patient ID: [name], male   DOB: 08-29-1941, 81 y.o.   MRN: [id number] [name] Progress Note   Assessment/ Plan:   1. Acute kidney Injury on chronic kidney disease stage IIIb: Baseline creatinine ranging 1.7-2.5 with suspicion that current acute kidney injury may be ATN in the setting of E faecium bacteremia/SIRS.  Renal function continues to improve with nonoliguric urine output.  Remains dehydrated, will continue D5 water to supplement oral intake. 2.  E. faecium bacteremia/neutropenic fever: On intravenous ampicillin, afebrile and normotensive overnight but continues to remain tachycardic (suspect that this might be in part from his dehydration). 3.  CLL: Unfortunately with clinical progression and currently on palliative care measures. 4.  Pancytopenia: Secondary to CLL, no overt blood loss  Subjective:   Reports that he is feeling a little better today and struggling with oral intake/fluids.   Objective:   BP (!) 148/65 (BP Location: Left Arm)   Pulse (!) 110 Comment: EOB HEP  Temp 97.8 F (36.6 C) (Axillary)   Resp 17   Ht '5\' 7"'$  (1.702 m)   Wt 64.2 kg   SpO2 99%   BMI 22.17 kg/m   Intake/Output Summary (Last 24 hours) at 03/03/2022 1216 Last data filed at 03/03/2022 0500 Gross per 24 hour  Intake 1867.79 ml  Output 1700 ml  Net 167.79 ml   Weight change: 0 kg  Physical Exam: Gen: Chronically ill-appearing man who is resting comfortably in bed watching television CVS: Pulse regular rhythm, tachycardia, S1 and S2 normal Resp: Distant breath sounds bilaterally, no distinct rales or rhonchi Abd: Soft, flat, nontender, bowel sounds normal Ext: 2+ bilateral upper extremity and lower extremity edema.  Imaging: DG Swallowing Func-Speech Pathology  Result Date: 03/02/2022 Table formatting from the original result was not included. Objective Swallowing Evaluation: Type of Study: MBS-Modified Barium Swallow Study  Patient Details Name: [name] MRN:  [id number] Date of Birth: 1941-09-22 Today's Date: 03/02/2022 Time: SLP Start Time (ACUTE ONLY): 1215 -SLP Stop Time (ACUTE ONLY): 4132 SLP Time Calculation (min) (ACUTE ONLY): 20 min Past Medical History: Past Medical History: Diagnosis Date  CLL (chronic lymphocytic leukemia) ([place])   chemotherapy  Colon cancer ([place])   Colorectal cancer ([place])   COLORECTAL DX 2/09, treated surgically  Diabetes mellitus   Hypercholesterolemia   Hypertension   Pneumonia   Bilateral pneumonia Past Surgical History: Past Surgical History: Procedure Laterality Date  HEMICOLECTOMY    nasal polyps    S/P nasal surgery HPI: 81yo male admitted 02/19/22 with generalized weakness, recurrent falls. PMH: CLL, DM2, HTN, colorectal cancer (2009), HLD. Recent hospitalization due to poor appetite/intake, FTT.  Pt had COVID and has had cough since his COVID.  MBS on 5/26 completed with recommendation for NPO.Patient and family are declining PEG and repeat MBS planned to aid in decision making regarding PO's.  Subjective: pleasant, cooperative  Recommendations for follow up therapy are one component of a multi-disciplinary discharge planning process, led by the attending physician.  Recommendations may be updated based on patient status, additional functional criteria and insurance authorization. Assessment / Plan / Recommendation   03/02/2022   1:40 PM Clinical Impressions Clinical Impression Patient presents with some improvements in swallow function as per this repeat MBS as compared to MBS on 02/20/22. He continues to exhibit swallow initiation delays at level of vallecular sinus with all consistencies (puree, honey, nectar, regular, barium tablet, thin). With thin liquids, patient had initially trace vallecular residuals and moderate pyriform sinus residuals. Pyriform residuals cleared to minimal  amount with 2-3 extra/dry swallows. Patient did exhibit very trace penetration during the swallow with thin liquids and trace to mild penetration from barium  flowing out of full pyriform sinus but in all instances, penetrate remained above vocal cords and fully excited laryngeal vestibule. Penetration observed after teh swallow with nectar thick liquids (PAS 2) with no aspiration and full clearance of penetrate from laryngeal vestibule. No aspiration obseved prior to, during or after the swallow with any of the tested barium consistencies. Vallecular residuals were trace to mild in amount for thin liquid, nectar thick, honey thick, puree and regular solids; pyriform sinus residuals were moderate in amount for thin liquid and mild for nectar, honey, puree and regular solids. Barium tablet (1/2 of a 54m tablet) became lodged in vallecular sinus and despite multiple trials, was not able to be cleared. Overall, patient appears to be protecting his airway better during the swallow and is able to more effectively clear pharyngeal residuals with extra/dry swallows. He will continue to be at risk for aspiration due to pyriform sinus residuals but aspiration risk can be reduced by using strategies of swallowing an extra 2-3 times for each bite/sip, fully masticating food before swallowing, and sitting upright during and after meals. SLP Visit Diagnosis Dysphagia, unspecified (R13.10) Impact on safety and function Moderate aspiration risk     03/02/2022   1:40 PM Treatment Recommendations Treatment Recommendations Therapy as outlined in treatment plan below     03/02/2022   1:52 PM Prognosis Prognosis for Safe Diet Advancement Good Barriers to Reach Goals Severity of deficits   03/02/2022   1:40 PM Diet Recommendations SLP Diet Recommendations Dysphagia 3 (Mech soft) solids;Thin liquid Liquid Administration via Cup;Straw Medication Administration Crushed with puree Compensations Slow rate;Small sips/bites Postural Changes Seated upright at 90 degrees;Remain semi-upright after after feeds/meals (Comment)     03/02/2022   1:40 PM Other Recommendations Oral Care Recommendations Oral care  BID Follow Up Recommendations Home health SLP Assistance recommended at discharge Frequent or constant Supervision/Assistance Functional Status Assessment Patient has had a recent decline in their functional status and demonstrates the ability to make significant improvements in function in a reasonable and predictable amount of time.   03/02/2022   1:40 PM Frequency and Duration  Speech Therapy Frequency (ACUTE ONLY) min 2x/week Treatment Duration 1 week     03/02/2022  12:58 PM [facility]   03/02/2022   1:38 PM Pharyngeal Phase Pharyngeal- Thin Cup Penetration/Aspiration during swallow;Penetration/Apiration after swallow Pharyngeal Material enters airway, remains ABOVE vocal cords then ejected out Pharyngeal- Thin Straw Penetration/Aspiration during swallow;Penetration/Apiration after swallow Pharyngeal Material enters airway, remains ABOVE vocal cords then ejected out    03/02/2022   1:39 PM Cervical Esophageal Phase  Cervical Esophageal Phase Impaired Honey Cup Reduced cricopharyngeal relaxation Nectar Cup Reduced cricopharyngeal relaxation Thin Cup Reduced cricopharyngeal relaxation Thin Straw Reduced cricopharyngeal relaxation Puree Reduced cricopharyngeal relaxation Regular Reduced cricopharyngeal relaxation Pill Other (Comment) JSonia Baller MA, CCC-SLP Speech Therapy                      Labs: BMET Recent Labs  Lab 02/24/22 1723 02/25/22 0424 02/26/22 0433206/02/23 0951806/03/23 0841606/04/23 0358 03/02/22 0357 03/03/22 0420  NA  --  139 139 138 138 142 147* 147*  K  --  4.0 4.1 4.3 4.6 4.7 4.9 4.3  CL  --  110 112* 113* 113* 117* 122* 119*  CO2  --  19* 21* 20* 19*  21* 20* 20*  GLUCOSE  --  221* 239* 179* 214* 105* 82 144*  BUN  --  67* 83* 86* 88* 86* 79* 65*  CREATININE  --  2.61* 3.11* 3.22* 3.59* 3.45* 3.26* 2.90*  CALCIUM  --  6.7* 6.7* 6.4* 6.7* 7.2* 8.1* 8.1*  PHOS 1.6*  --   --   --   --   --   --  3.8   CBC Recent Labs  Lab 02/28/22 0439 03/01/22 0358  03/01/22 1811 03/02/22 0357 03/03/22 0420  WBC 3.1* 1.8*  --  2.2* 2.0*  NEUTROABS 1.6* 0.8*  --  1.1* 1.0*  HGB 8.0* 6.9* 8.8* 8.7* 8.0*  HCT 26.4* 22.4* 27.1* 27.3* 26.0*  MCV 88.6 85.8  --  87.2 88.7  PLT 102* 90*  --  106* 104*   Medications:     atorvastatin  40 mg Oral Daily   chlorhexidine  15 mL Mouth Rinse BID   fiber  1 packet Oral TID   guaiFENesin-dextromethorphan  5 mL Oral Q8H   heparin injection (subcutaneous)  5,000 Units Subcutaneous Q8H   insulin aspart  0-9 Units Subcutaneous Q4H   liver oil-zinc oxide   Topical BID   metoprolol tartrate  12.5 mg Oral BID   mirtazapine  7.5 mg Oral QHS   multivitamin with minerals  1 tablet Oral Daily   ondansetron  4 mg Oral Q8H   Or   ondansetron (ZOFRAN) IV  4 mg Intravenous Q8H   pantoprazole  40 mg Oral BID   zinc sulfate  220 mg Oral Daily   Elmarie Shiley, MD 03/03/2022, 12:16 PM

## 2022-03-03 NOTE — Progress Notes (Addendum)
PROGRESS NOTE    [name]  ALP:[id number] DOB: 19-Feb-1941 DOA: 02/19/2022 PCP: [name], MD   Brief Narrative:  81 year old male with history of CLL initially diagnosed in 2014, status postchemotherapy and currently on Imbruvica, DM 2, HTN, colorectal cancer 2009, who came into the hospital with progressive and generalized weakness.  Patient has been experiencing some weight loss and poor appetite for the past several months, was seen by GI in February as an outpatient and underwent work-up without any significant major findings.  He was taken off Imbruvica in April as it was believed that it may be related to that, however he continued to decline.  He was hospitalized just 2 weeks ago for dehydration, and when he left the hospital he was placed back on his Imbruvica.  While at home he continued to decline and was rehospitalized on 5/25.  Repeat CT scan of the abdomen and pelvis showed recurrence of the CLL, he was also found to be neutropenic, have a fever and have Enterococcus bacteremia.  Oncology and ID consulted.  Palliative care has also been consulted for goals of care discussion.  Nephrology was consulted on 02/27/2022 for worsening creatinine.  Assessment & Plan:   Sepsis: Present on admission Enterococcus faecium bacteremia Neutropenic fever -2D echo showed normal EF of 60 to 65%, small pericardial effusion,  there was also a nodular calcification in the right coronary cusp likely sclerosis but if high suspicion for SBE, consider TEE.  ID following and recommending TEE.  Cardiology was the opinion that patient is high risk for TEE.  Patient has opted to not proceed with TEE.  ID recommends to continue ampicillin 2 weeks from 02/21/2022 (end date 03/07/2022): If discharged earlier, can transition to Zyvox to finish the course repeat TTE in around 2 weeks.  Outpatient follow-up with ID -currently on ampicillin.  No temperature spikes recently.  Blood pressure stable. -Repeat blood  cultures from 02/21/2022 are negative so far  Goals of care Failure to thrive Dehydration/dysphagia/chronic cough CLL Physical deconditioning -overall difficult situation and complex case given progression of his CLL with progressive lymphadenopathy and now complicated by persistent neutropenia and Enterococcus bacteremia with concern for infectious endocarditis.   -Palliative care following -Oncology following intermittently: Family is waiting for discussion with Dr. Alen Blew regarding further management plan.  -Cortrak had to be removed on 02/28/2022 because of malposition which did not improve despite trying to reposition it by the nursing staff.  He has not made up his mind about PEG tube placement/feeding. SLP evaluated him again on 03/02/2022 and placed him on dysphagia 3 diet -Dietitian following  Acute kidney injury on CKD stage IIIb -Baseline creatinine 1.4-1.6.  Creatinine is 2.90 today.   -Renal ultrasound negative for hydronephrosis.  Repeat a.m. labs.   -Nephrology following: Diuretics discontinued by nephrology and patient was given few doses of albumin.  Patient is not a candidate for dialysis.  Currently on dextrose IV as per nephrology.  Diarrhea -Possibly from tube feeds and antibiotic use.  Imodium as needed.  Patient had a rectal tube for the last few days which was dislodged on 03/02/22.  Pancytopenia -Due to malignancy.  Patient received a few doses of Granix as per oncology recommendations for neutropenic fever.  Hemoglobin 6.9 on 03/01/2022.  Status post 1 unit packed red cells transfusion on 03/01/2022.  Hemoglobin 8 today.  Hyperlipidemia -Continue statin  Essential hypertension  -Continue IV metoprolol as needed.  Will resume oral metoprolol as patient has been placed on a diet.  Her amlodipine discontinued on 02/27/2022 by nephrology  Hyperlipidemia -Continue statin   Diabetes mellitus type 2 with hyperglycemia -Blood sugars are improving.  Might have to restart  long-acting insulin now that the patient has been put on a diet.  Continue CBGs with SSI.  Severe protein calorie malnutrition/hypoalbuminemia -Dietitian following.  Plan as above  DVT prophylaxis: We will resume heparin  code Status: Full Family Communication: Wife at bedside Disposition Plan: Status is: Inpatient Remains inpatient appropriate because: Of severity of illness  Consultants: ID/oncology/palliative care/nephrology  Procedures: 2D echo  Antimicrobials:  Anti-infectives (From admission, onward)    Start     Dose/Rate Route Frequency Ordered Stop   02/23/22 1900  ampicillin (OMNIPEN) 2 g in sodium chloride 0.9 % 100 mL IVPB        2 g 300 mL/hr over 20 Minutes Intravenous Every 8 hours 02/23/22 1059 03/07/22 2359   02/21/22 1000  ampicillin (OMNIPEN) 2 g in sodium chloride 0.9 % 100 mL IVPB  Status:  Discontinued        2 g 300 mL/hr over 20 Minutes Intravenous Every 6 hours 02/21/22 0940 02/23/22 1059   02/21/22 0300  ampicillin (OMNIPEN) 2 g in sodium chloride 0.9 % 100 mL IVPB  Status:  Discontinued        2 g 300 mL/hr over 20 Minutes Intravenous Every 8 hours 02/21/22 0215 02/21/22 0940   02/20/22 0815  vancomycin (VANCOREADY) IVPB 1250 mg/250 mL  Status:  Discontinued        1,250 mg 166.7 mL/hr over 90 Minutes Intravenous Every 48 hours 02/20/22 0715 02/21/22 0215   02/20/22 0800  ceFEPIme (MAXIPIME) 2 g in sodium chloride 0.9 % 100 mL IVPB  Status:  Discontinued        2 g 200 mL/hr over 30 Minutes Intravenous Every 24 hours 02/20/22 0643 02/21/22 0215   02/20/22 0800  vancomycin (VANCOREADY) IVPB 1750 mg/350 mL  Status:  Discontinued        1,750 mg 175 mL/hr over 120 Minutes Intravenous Every 48 hours 02/20/22 0703 02/20/22 0715        Subjective: Patient seen and examined at bedside.  Poor historian.  No worsening shortness of breath, agitation, fever or vomiting reported.  Oral intake very poor this morning as per nursing staff.  Objective: Vitals:    03/02/22 0500 03/02/22 0939 03/03/22 0448 03/03/22 0453  BP:  (!) 151/66 (!) 148/65   Pulse:  (!) 109 98   Resp:      Temp:  98.1 F (36.7 C) 97.8 F (36.6 C)   TempSrc:  Oral Axillary   SpO2:  100% 99%   Weight: 64.2 kg   64.2 kg  Height:        Intake/Output Summary (Last 24 hours) at 03/03/2022 0800 Last data filed at 03/03/2022 0500 Gross per 24 hour  Intake 1867.79 ml  Output 1700 ml  Net 167.79 ml    Filed Weights   03/01/22 0500 03/02/22 0500 03/03/22 0453  Weight: 65 kg 64.2 kg 64.2 kg    Examination:  General: No acute distress.  Currently still on room air.  Chronically ill and deconditioned looking. ENT/neck: No obvious palpable neck masses.  JVD is not elevated.   Respiratory: Decreased breath sounds at bases bilaterally, no wheezing CVS: Still tachycardic; S1 and S2 are heard  abdominal: Soft, nontender, still distended mildly; no organomegaly, normal bowel sounds heard  extremities: Trace lower extremity edema present; no cyanosis  CNS: Slow to respond.  Alert.  Poor historian.  No focal neurologic deficit.  Currently moves extremities  lymph: No obvious cervical lymphadenopathy noted  skin: No petechiae/rashes psych: Extremely flat affect.  No agitation currently  musculoskeletal: No obvious joint swelling/deformity    Data Reviewed: I have personally reviewed following labs and imaging studies  CBC: Recent Labs  Lab 02/27/22 0352 02/28/22 0439 03/01/22 0358 03/01/22 1811 03/02/22 0357 03/03/22 0420  WBC 2.9* 3.1* 1.8*  --  2.2* 2.0*  NEUTROABS 1.7 1.6* 0.8*  --  1.1* 1.0*  HGB 7.5* 8.0* 6.9* 8.8* 8.7* 8.0*  HCT 24.4* 26.4* 22.4* 27.1* 27.3* 26.0*  MCV 85.6 88.6 85.8  --  87.2 88.7  PLT 91* 102* 90*  --  106* 104*    Basic Metabolic Panel: Recent Labs  Lab 02/24/22 1723 02/25/22 0424 02/27/22 0352 02/28/22 0439 03/01/22 0358 03/02/22 0357 03/03/22 0420  NA  --    < > 138 138 142 147* 147*  K  --    < > 4.3 4.6 4.7 4.9 4.3  CL  --     < > 113* 113* 117* 122* 119*  CO2  --    < > 20* 19* 21* 20* 20*  GLUCOSE  --    < > 179* 214* 105* 82 144*  BUN  --    < > 86* 88* 86* 79* 65*  CREATININE  --    < > 3.22* 3.59* 3.45* 3.26* 2.90*  CALCIUM  --    < > 6.4* 6.7* 7.2* 8.1* 8.1*  MG 1.9   < > 2.0 2.1 2.1 2.2 2.0  PHOS 1.6*  --   --   --   --   --  3.8   < > = values in this interval not displayed.    GFR: Estimated Creatinine Clearance: 18.8 mL/min (A) (by C-G formula based on SCr of 2.9 mg/dL (H)). Liver Function Tests: Recent Labs  Lab 02/25/22 0424 02/27/22 0352 03/01/22 0358 03/03/22 0420  AST '30 23 22  '$ --   ALT '27 26 22  '$ --   ALKPHOS 106 109 95  --   BILITOT 0.5 0.6 0.7  --   PROT 4.0* 3.7* 4.1*  --   ALBUMIN 1.7* 1.5* 2.3* 2.9*    No results for input(s): LIPASE, AMYLASE in the last 168 hours. No results for input(s): AMMONIA in the last 168 hours. Coagulation Profile: No results for input(s): INR, PROTIME in the last 168 hours. Cardiac Enzymes: No results for input(s): CKTOTAL, CKMB, CKMBINDEX, TROPONINI in the last 168 hours. BNP (last 3 results) No results for input(s): PROBNP in the last 8760 hours. HbA1C: No results for input(s): HGBA1C in the last 72 hours. CBG: Recent Labs  Lab 03/02/22 1549 03/02/22 2042 03/02/22 2345 03/03/22 0418 03/03/22 0730  GLUCAP 285* 281* 225* 155* 143*    Lipid Profile: No results for input(s): CHOL, HDL, LDLCALC, TRIG, CHOLHDL, LDLDIRECT in the last 72 hours. Thyroid Function Tests: No results for input(s): TSH, T4TOTAL, FREET4, T3FREE, THYROIDAB in the last 72 hours. Anemia Panel: No results for input(s): VITAMINB12, FOLATE, FERRITIN, TIBC, IRON, RETICCTPCT in the last 72 hours. Sepsis Labs: No results for input(s): PROCALCITON, LATICACIDVEN in the last 168 hours.   No results found for this or any previous visit (from the past 240 hour(s)).         Radiology Studies: DG Swallowing Func-Speech Pathology  Result Date: 03/02/2022 Table  formatting from the original result was not included. Objective Swallowing Evaluation: Type  of Study: MBS-Modified Barium Swallow Study  Patient Details Name: Raydel Hosick MRN: [id number] Date of Birth: 05-Jul-1941 Today's Date: 03/02/2022 Time: SLP Start Time (ACUTE ONLY): 1215 -SLP Stop Time (ACUTE ONLY): 3662 SLP Time Calculation (min) (ACUTE ONLY): 20 min Past Medical History: Past Medical History: Diagnosis Date  CLL (chronic lymphocytic leukemia) ([place])   chemotherapy  Colon cancer ([place])   Colorectal cancer ([place])   COLORECTAL DX 2/09, treated surgically  Diabetes mellitus   Hypercholesterolemia   Hypertension   Pneumonia   Bilateral pneumonia Past Surgical History: Past Surgical History: Procedure Laterality Date  HEMICOLECTOMY    nasal polyps    S/P nasal surgery HPI: 81yo male admitted 02/19/22 with generalized weakness, recurrent falls. PMH: CLL, DM2, HTN, colorectal cancer (2009), HLD. Recent hospitalization due to poor appetite/intake, FTT.  Pt had COVID and has had cough since his COVID.  MBS on 5/26 completed with recommendation for NPO.Patient and family are declining PEG and repeat MBS planned to aid in decision making regarding PO's.  Subjective: pleasant, cooperative  Recommendations for follow up therapy are one component of a multi-disciplinary discharge planning process, led by the attending physician.  Recommendations may be updated based on patient status, additional functional criteria and insurance authorization. Assessment / Plan / Recommendation   03/02/2022   1:40 PM Clinical Impressions Clinical Impression Patient presents with some improvements in swallow function as per this repeat MBS as compared to MBS on 02/20/22. He continues to exhibit swallow initiation delays at level of vallecular sinus with all consistencies (puree, honey, nectar, regular, barium tablet, thin). With thin liquids, patient had initially trace vallecular residuals and moderate pyriform sinus residuals. Pyriform residuals  cleared to minimal amount with 2-3 extra/dry swallows. Patient did exhibit very trace penetration during the swallow with thin liquids and trace to mild penetration from barium flowing out of full pyriform sinus but in all instances, penetrate remained above vocal cords and fully excited laryngeal vestibule. Penetration observed after teh swallow with nectar thick liquids (PAS 2) with no aspiration and full clearance of penetrate from laryngeal vestibule. No aspiration obseved prior to, during or after the swallow with any of the tested barium consistencies. Vallecular residuals were trace to mild in amount for thin liquid, nectar thick, honey thick, puree and regular solids; pyriform sinus residuals were moderate in amount for thin liquid and mild for nectar, honey, puree and regular solids. Barium tablet (1/2 of a 72m tablet) became lodged in vallecular sinus and despite multiple trials, was not able to be cleared. Overall, patient appears to be protecting his airway better during the swallow and is able to more effectively clear pharyngeal residuals with extra/dry swallows. He will continue to be at risk for aspiration due to pyriform sinus residuals but aspiration risk can be reduced by using strategies of swallowing an extra 2-3 times for each bite/sip, fully masticating food before swallowing, and sitting upright during and after meals. SLP Visit Diagnosis Dysphagia, unspecified (R13.10) Impact on safety and function Moderate aspiration risk     03/02/2022   1:40 PM Treatment Recommendations Treatment Recommendations Therapy as outlined in treatment plan below     03/02/2022   1:52 PM Prognosis Prognosis for Safe Diet Advancement Good Barriers to Reach Goals Severity of deficits   03/02/2022   1:40 PM Diet Recommendations SLP Diet Recommendations Dysphagia 3 (Mech soft) solids;Thin liquid Liquid Administration via Cup;Straw Medication Administration Crushed with puree Compensations Slow rate;Small sips/bites  Postural Changes Seated upright at 90 degrees;Remain semi-upright after after  feeds/meals (Comment)     03/02/2022   1:40 PM Other Recommendations Oral Care Recommendations Oral care BID Follow Up Recommendations Home health SLP Assistance recommended at discharge Frequent or constant Supervision/Assistance Functional Status Assessment Patient has had a recent decline in their functional status and demonstrates the ability to make significant improvements in function in a reasonable and predictable amount of time.   03/02/2022   1:40 PM Frequency and Duration  Speech Therapy Frequency (ACUTE ONLY) min 2x/week Treatment Duration 1 week     03/02/2022  12:58 PM Oral Phase Oral Phase [facility]    03/02/2022   1:38 PM Pharyngeal Phase Pharyngeal- Thin Cup Penetration/Aspiration during swallow;Penetration/Apiration after swallow Pharyngeal Material enters airway, remains ABOVE vocal cords then ejected out Pharyngeal- Thin Straw Penetration/Aspiration during swallow;Penetration/Apiration after swallow Pharyngeal Material enters airway, remains ABOVE vocal cords then ejected out    03/02/2022   1:39 PM Cervical Esophageal Phase  Cervical Esophageal Phase Impaired Honey Cup Reduced cricopharyngeal relaxation Nectar Cup Reduced cricopharyngeal relaxation Thin Cup Reduced cricopharyngeal relaxation Thin Straw Reduced cricopharyngeal relaxation Puree Reduced cricopharyngeal relaxation Regular Reduced cricopharyngeal relaxation Pill Other (Comment) Sonia Baller, MA, CCC-SLP Speech Therapy                          Scheduled Meds:  atorvastatin  40 mg Oral Daily   chlorhexidine  15 mL Mouth Rinse BID   fiber  1 packet Oral TID   guaiFENesin-dextromethorphan  5 mL Oral Q8H   insulin aspart  0-9 Units Subcutaneous Q4H   liver oil-zinc oxide   Topical BID   metoprolol tartrate  12.5 mg Oral BID   mirtazapine  7.5 mg Oral QHS   multivitamin with minerals  1 tablet Oral Daily   ondansetron  4 mg Oral Q8H   Or   ondansetron  (ZOFRAN) IV  4 mg Intravenous Q8H   pantoprazole  40 mg Oral BID   zinc sulfate  220 mg Oral Daily   Continuous Infusions:  sodium chloride Stopped (02/20/22 0850)   ampicillin (OMNIPEN) IV 2 g (03/03/22 0130)   dextrose 100 mL/hr at 03/03/22 0445          Donya Hitch Starla Link, MD [facility] 03/03/2022, 8:00 AM

## 2022-03-04 ENCOUNTER — Inpatient Hospital Stay: Payer: Medicare Other | Admitting: Oncology

## 2022-03-04 ENCOUNTER — Inpatient Hospital Stay: Payer: Medicare Other

## 2022-03-04 DIAGNOSIS — B952 Enterococcus as the cause of diseases classified elsewhere: Secondary | ICD-10-CM | POA: Diagnosis not present

## 2022-03-04 DIAGNOSIS — R7881 Bacteremia: Secondary | ICD-10-CM | POA: Diagnosis not present

## 2022-03-04 LAB — CBC WITH DIFFERENTIAL/PLATELET
Abs Immature Granulocytes: 0.02 10*3/uL (ref 0.00–0.07)
Basophils Absolute: 0 10*3/uL (ref 0.0–0.1)
Basophils Relative: 1 %
Eosinophils Absolute: 0.1 10*3/uL (ref 0.0–0.5)
Eosinophils Relative: 3 %
HCT: 27.1 % — ABNORMAL LOW (ref 39.0–52.0)
Hemoglobin: 8.6 g/dL — ABNORMAL LOW (ref 13.0–17.0)
Immature Granulocytes: 1 %
Lymphocytes Relative: 41 %
Lymphs Abs: 0.9 10*3/uL (ref 0.7–4.0)
MCH: 27.7 pg (ref 26.0–34.0)
MCHC: 31.7 g/dL (ref 30.0–36.0)
MCV: 87.1 fL (ref 80.0–100.0)
Monocytes Absolute: 0.1 10*3/uL (ref 0.1–1.0)
Monocytes Relative: 6 %
Neutro Abs: 1.1 10*3/uL — ABNORMAL LOW (ref 1.7–7.7)
Neutrophils Relative %: 48 %
Platelets: 88 10*3/uL — ABNORMAL LOW (ref 150–400)
RBC: 3.11 MIL/uL — ABNORMAL LOW (ref 4.22–5.81)
RDW: 16.8 % — ABNORMAL HIGH (ref 11.5–15.5)
WBC: 2.2 10*3/uL — ABNORMAL LOW (ref 4.0–10.5)
nRBC: 0 % (ref 0.0–0.2)

## 2022-03-04 LAB — RENAL FUNCTION PANEL
Albumin: 2.6 g/dL — ABNORMAL LOW (ref 3.5–5.0)
Anion gap: 6 (ref 5–15)
BUN: 49 mg/dL — ABNORMAL HIGH (ref 8–23)
CO2: 20 mmol/L — ABNORMAL LOW (ref 22–32)
Calcium: 8.1 mg/dL — ABNORMAL LOW (ref 8.9–10.3)
Chloride: 115 mmol/L — ABNORMAL HIGH (ref 98–111)
Creatinine, Ser: 2.47 mg/dL — ABNORMAL HIGH (ref 0.61–1.24)
GFR, Estimated: 26 mL/min — ABNORMAL LOW (ref >60)
Glucose, Bld: 165 mg/dL — ABNORMAL HIGH (ref 70–99)
Phosphorus: 3.2 mg/dL (ref 2.5–4.6)
Potassium: 3.8 mmol/L (ref 3.5–5.1)
Sodium: 141 mmol/L (ref 135–145)

## 2022-03-04 LAB — BASIC METABOLIC PANEL
Anion gap: 5 (ref 5–15)
BUN: 51 mg/dL — ABNORMAL HIGH (ref 8–23)
CO2: 20 mmol/L — ABNORMAL LOW (ref 22–32)
Calcium: 8.1 mg/dL — ABNORMAL LOW (ref 8.9–10.3)
Chloride: 116 mmol/L — ABNORMAL HIGH (ref 98–111)
Creatinine, Ser: 2.55 mg/dL — ABNORMAL HIGH (ref 0.61–1.24)
GFR, Estimated: 25 mL/min — ABNORMAL LOW (ref >60)
Glucose, Bld: 169 mg/dL — ABNORMAL HIGH (ref 70–99)
Potassium: 3.8 mmol/L (ref 3.5–5.1)
Sodium: 141 mmol/L (ref 135–145)

## 2022-03-04 LAB — GLUCOSE, CAPILLARY
Glucose-Capillary: 165 mg/dL — ABNORMAL HIGH (ref 70–99)
Glucose-Capillary: 213 mg/dL — ABNORMAL HIGH (ref 70–99)
Glucose-Capillary: 240 mg/dL — ABNORMAL HIGH (ref 70–99)
Glucose-Capillary: 175 mg/dL — ABNORMAL HIGH (ref 70–99)
Glucose-Capillary: 129 mg/dL — ABNORMAL HIGH (ref 70–99)

## 2022-03-04 LAB — MAGNESIUM: Magnesium: 1.8 mg/dL (ref 1.7–2.4)

## 2022-03-04 MED ORDER — BOOST / RESOURCE BREEZE PO LIQD CUSTOM
1.0000 | Freq: Two times a day (BID) | ORAL | Status: DC
  Administered 2022-03-05 – 2022-03-06 (×3): 1 via ORAL

## 2022-03-04 MED ORDER — ENSURE ENLIVE PO LIQD
237.0000 mL | Freq: Two times a day (BID) | ORAL | Status: DC
Start: 2022-03-04 — End: 2022-03-08
  Administered 2022-03-04 – 2022-03-07 (×3): 237 mL via ORAL

## 2022-03-04 MED ORDER — NUTRISOURCE FIBER PO PACK
1.0000 | PACK | Freq: Three times a day (TID) | ORAL | Status: DC
  Administered 2022-03-04 – 2022-03-08 (×11): 1 via ORAL
  Filled 2022-03-04 (×15): qty 1

## 2022-03-04 NOTE — TOC Progression Note (Signed)
Transition of Care [facility]) - Progression Note    Patient Details  Name: Aseem Sessums MRN: [id number] Date of Birth: 07/07/1941  [facility] CM/SW Contact  Leeroy Cha, RN Phone Number: 03/04/2022, 7:40 AM  Clinical Narrative:    Following for hhc and toc needs    Expected Discharge Plan: Home/Self Care Barriers to Discharge: Continued Medical Work up  Expected Discharge Plan and Services Expected Discharge Plan: Home/Self Care   Discharge Planning Services: CM Consult   Living arrangements for the past 2 months: Single Family Home                                       Social Determinants of Health (SDOH) Interventions    Readmission Risk Interventions     View : No data to display.

## 2022-03-04 NOTE — Progress Notes (Signed)
Patient ID: [name], male   DOB: 08/13/1941, 81 y.o.   MRN: [id number] [facility] Progress Note   Assessment/ Plan:   1. Acute kidney Injury on chronic kidney disease stage IIIb: Baseline creatinine ranging 1.7-2.5 with suspicion that current acute kidney injury may be ATN in the setting of E faecium bacteremia/SIRS.  Renal function continues to improve with nonoliguric urine output.  We will discontinue intravenous fluids and encourage water intake (which has been challenging for him so far).  Unfortunately poor prognosis, if renal function continues to worsen again would recommend transition to comfort measures only. 2.  E. faecium bacteremia/neutropenic fever: On intravenous ampicillin, afebrile and normotensive overnight. 3.  CLL: Unfortunately with clinical progression and currently on palliative care measures. 4.  Pancytopenia: Secondary to CLL, no overt blood loss  Nephrology service will sign off, please call with questions or concerns.   Subjective:   No acute events overnight, still struggling with fluid intake.  Denies any chest pain or shortness of breath.   Objective:   BP (!) 147/71 (BP Location: Left Arm)   Pulse 92   Temp 98 F (36.7 C) (Oral)   Resp 16   Ht '5\' 7"'$  (1.702 m)   Wt 66.1 kg   SpO2 100%   BMI 22.82 kg/m   Intake/Output Summary (Last 24 hours) at 03/04/2022 1232 Last data filed at 03/04/2022 0544 Gross per 24 hour  Intake 2226.2 ml  Output 1050 ml  Net 1176.2 ml   Weight change: 1.9 kg  Physical Exam: Gen: Chronically ill-appearing man who is resting comfortably in bed watching television.  Family at bedside CVS: Pulse regular rhythm, tachycardia, S1 and S2 normal Resp: Distant breath sounds bilaterally, no distinct rales or rhonchi Abd: Soft, flat, nontender, bowel sounds normal Ext: 2+ bilateral upper extremity and lower extremity edema.  Imaging: DG Swallowing Func-Speech Pathology  Result Date: 03/02/2022 Table formatting  from the original result was not included. Objective Swallowing Evaluation: Type of Study: MBS-Modified Barium Swallow Study  Patient Details Name: [name] MRN: [id number] Date of Birth: 1941/09/09 Today's Date: 03/02/2022 Time: SLP Start Time (ACUTE ONLY): 1215 -SLP Stop Time (ACUTE ONLY): 5102 SLP Time Calculation (min) (ACUTE ONLY): 20 min Past Medical History: Past Medical History: Diagnosis Date  CLL (chronic lymphocytic leukemia) ([place])   chemotherapy  Colon cancer ([place])   Colorectal cancer ([place])   COLORECTAL DX 2/09, treated surgically  Diabetes mellitus   Hypercholesterolemia   Hypertension   Pneumonia   Bilateral pneumonia Past Surgical History: Past Surgical History: Procedure Laterality Date  HEMICOLECTOMY    nasal polyps    S/P nasal surgery HPI: 81yo male admitted 02/19/22 with generalized weakness, recurrent falls. PMH: CLL, DM2, HTN, colorectal cancer (2009), HLD. Recent hospitalization due to poor appetite/intake, FTT.  Pt had COVID and has had cough since his COVID.  MBS on 5/26 completed with recommendation for NPO.Patient and family are declining PEG and repeat MBS planned to aid in decision making regarding PO's.  Subjective: pleasant, cooperative  Recommendations for follow up therapy are one component of a multi-disciplinary discharge planning process, led by the attending physician.  Recommendations may be updated based on patient status, additional functional criteria and insurance authorization. Assessment / Plan / Recommendation   03/02/2022   1:40 PM Clinical Impressions Clinical Impression Patient presents with some improvements in swallow function as per this repeat MBS as compared to MBS on 02/20/22. He continues to exhibit swallow initiation delays at level of vallecular sinus with  all consistencies (puree, honey, nectar, regular, barium tablet, thin). With thin liquids, patient had initially trace vallecular residuals and moderate pyriform sinus residuals. Pyriform residuals cleared to  minimal amount with 2-3 extra/dry swallows. Patient did exhibit very trace penetration during the swallow with thin liquids and trace to mild penetration from barium flowing out of full pyriform sinus but in all instances, penetrate remained above vocal cords and fully excited laryngeal vestibule. Penetration observed after teh swallow with nectar thick liquids (PAS 2) with no aspiration and full clearance of penetrate from laryngeal vestibule. No aspiration obseved prior to, during or after the swallow with any of the tested barium consistencies. Vallecular residuals were trace to mild in amount for thin liquid, nectar thick, honey thick, puree and regular solids; pyriform sinus residuals were moderate in amount for thin liquid and mild for nectar, honey, puree and regular solids. Barium tablet (1/2 of a 48m tablet) became lodged in vallecular sinus and despite multiple trials, was not able to be cleared. Overall, patient appears to be protecting his airway better during the swallow and is able to more effectively clear pharyngeal residuals with extra/dry swallows. He will continue to be at risk for aspiration due to pyriform sinus residuals but aspiration risk can be reduced by using strategies of swallowing an extra 2-3 times for each bite/sip, fully masticating food before swallowing, and sitting upright during and after meals. SLP Visit Diagnosis Dysphagia, unspecified (R13.10) Impact on safety and function Moderate aspiration risk     03/02/2022   1:40 PM Treatment Recommendations Treatment Recommendations Therapy as outlined in treatment plan below     03/02/2022   1:52 PM Prognosis Prognosis for Safe Diet Advancement Good Barriers to Reach Goals Severity of deficits   03/02/2022   1:40 PM Diet Recommendations SLP Diet Recommendations Dysphagia 3 (Mech soft) solids;Thin liquid Liquid Administration via Cup;Straw Medication Administration Crushed with puree Compensations Slow rate;Small sips/bites Postural Changes  Seated upright at 90 degrees;Remain semi-upright after after feeds/meals (Comment)     03/02/2022   1:40 PM Other Recommendations Oral Care Recommendations Oral care BID Follow Up Recommendations Home health SLP Assistance recommended at discharge Frequent or constant Supervision/Assistance Functional Status Assessment Patient has had a recent decline in their functional status and demonstrates the ability to make significant improvements in function in a reasonable and predictable amount of time.   03/02/2022   1:40 PM Frequency and Duration  Speech Therapy Frequency (ACUTE ONLY) min 2x/week Treatment Duration 1 week     03/02/2022  12:58 PM Oral [facility]   03/02/2022   1:38 PM Pharyngeal Phase Pharyngeal- Thin Cup Penetration/Aspiration during swallow;Penetration/Apiration after swallow Pharyngeal Material enters airway, remains ABOVE vocal cords then ejected out Pharyngeal- Thin Straw Penetration/Aspiration during swallow;Penetration/Apiration after swallow Pharyngeal Material enters airway, remains ABOVE vocal cords then ejected out    03/02/2022   1:39 PM Cervical Esophageal Phase  Cervical Esophageal Phase Impaired Honey Cup Reduced cricopharyngeal relaxation Nectar Cup Reduced cricopharyngeal relaxation Thin Cup Reduced cricopharyngeal relaxation Thin Straw Reduced cricopharyngeal relaxation Puree Reduced cricopharyngeal relaxation Regular Reduced cricopharyngeal relaxation Pill Other (Comment) JSonia Baller MA, CCC-SLP Speech Therapy                      Labs: BMET Recent Labs  Lab 02/26/22 0420 02/27/22 0352 02/28/22 0439 03/01/22 0358 03/02/22 0357 03/03/22 0420 03/04/22 0344  NA 139 138 138 142 147* 147* 141  141  K 4.1 4.3 4.6 4.7 4.9 4.3  3.8  3.8  CL 112* 113* 113* 117* 122* 119* 116*  115*  CO2 21* 20* 19* 21* 20* 20* 20*  20*  GLUCOSE 239* 179* 214* 105* 82 144* 169*  165*  BUN 83* 86* 88* 86* 79* 65* 51*  49*  CREATININE 3.11* 3.22* 3.59* 3.45* 3.26* 2.90* 2.55*  2.47*   CALCIUM 6.7* 6.4* 6.7* 7.2* 8.1* 8.1* 8.1*  8.1*  PHOS  --   --   --   --   --  3.8 3.2   CBC Recent Labs  Lab 03/01/22 0358 03/01/22 1811 03/02/22 0357 03/03/22 0420 03/04/22 0344  WBC 1.8*  --  2.2* 2.0* 2.2*  NEUTROABS 0.8*  --  1.1* 1.0* 1.1*  HGB 6.9* 8.8* 8.7* 8.0* 8.6*  HCT 22.4* 27.1* 27.3* 26.0* 27.1*  MCV 85.8  --  87.2 88.7 87.1  PLT 90*  --  106* 104* 88*   Medications:     atorvastatin  40 mg Oral Daily   chlorhexidine  15 mL Mouth Rinse BID   feeding supplement  1 Container Oral BID BM   feeding supplement  237 mL Oral BID BM   fiber  1 packet Oral TID   guaiFENesin-dextromethorphan  5 mL Oral Q8H   heparin injection (subcutaneous)  5,000 Units Subcutaneous Q8H   insulin aspart  0-9 Units Subcutaneous Q4H   liver oil-zinc oxide   Topical BID   metoprolol tartrate  12.5 mg Oral BID   mirtazapine  7.5 mg Oral QHS   multivitamin with minerals  1 tablet Oral Daily   ondansetron  4 mg Oral Q8H   Or   ondansetron (ZOFRAN) IV  4 mg Intravenous Q8H   pantoprazole  40 mg Oral BID   zinc sulfate  220 mg Oral Daily   Elmarie Shiley, MD 03/04/2022, 12:32 PM

## 2022-03-04 NOTE — Progress Notes (Signed)
Speech Language Pathology Treatment: Dysphagia  Patient Details Name: [name] MRN: [id number] DOB: 1941/07/14 Today's Date: 03/04/2022 Time: 3151-7616 SLP Time Calculation (min) (ACUTE ONLY): 27 min  Assessment / Plan / Recommendation Clinical Impression  Skilled SLP session focused on dysphagia management, assuring pt completing swallowing/RMST exercises and is tolerating po intake with compensation strategies deemed to be helpful during MBS.  Reviewed that pt's barium tablet lodged at his vallecular region without clearance despite multiple boluses of liquids, pudding,etc  provided.  Demonstrated expectoration "Hock" to clear solids from pharynx/vallecular region with pt returning demonstration.  Pt admits to some coughing continues with po but much better than PTA or earlier in hospital coarse.    Encouraged pt to consume po as much as able to help him to recover/strengthen.  RD following pt - thank you!   Pt reports he enjoys chocolate Ensure - provided him with one before session completion, as he reports he has not consumed any today. RN reports he was offered supplements but declined them.     RMST exercise completed x1 - 10 repetitions with pt's work level 7/10 with mod I cues to assure appropriate time between repetitions.  He endorses completing these exercises throughout the day as prescribed. Voice is much stronger today with no wet quality- thankfully. Pt has made excellent progress with swallowing/airwy protection. Will follow up briefly for active dysphagia treatment.    HPI HPI: 81yo male admitted 02/19/22 with generalized weakness, recurrent falls. PMH: CLL, DM2, HTN, colorectal cancer (2009), HLD. Recent hospitalization due to poor appetite/intake, FTT.  Pt had COVID and has had cough since his COVID.  MBS on 5/26 completed with recommendation for NPO.Patient and family are declining PEG and repeat MBS planned to aid in decision making regarding PO's.  Pt has been placed on a  diet of dys3/thin after MBS completed over the weekend *Pt's feeding tube fell out.  Follow up to assure tolerance and assure pt is completing his exercise program provided by this SLP.      SLP Plan  Continue with current plan of care      Recommendations for follow up therapy are one component of a multi-disciplinary discharge planning process, led by the attending physician.  Recommendations may be updated based on patient status, additional functional criteria and insurance authorization.    Recommendations  Diet recommendations: Dysphagia 3 (mechanical soft);Thin liquid Liquids provided via: Straw;Cup Medication Administration: Crushed with puree Supervision: Patient able to self feed Compensations: Slow rate;Small sips/bites;Multiple dry swallows after each bite/sip;Clear throat intermittently Postural Changes and/or Swallow Maneuvers: Upright 30-60 min after meal                Follow Up Recommendations: Home health SLP Assistance recommended at discharge: Frequent or constant Supervision/Assistance SLP Visit Diagnosis: Dysphagia, pharyngoesophageal phase (R13.14) Plan: Continue with current plan of care          Kathleen Lime, MS [place] Office [phone] Pager [phone]  [name]  03/04/2022, 7:53 PM

## 2022-03-04 NOTE — Progress Notes (Signed)
IP PROGRESS NOTE  Subjective:   Mr. Schippers is about the same and overall asymptomatic.  He remains quite debilitated with poor performance status and is mostly bedridden.  He is able to sit in the chair recently.  He denies any fevers or chills or sweats.  Objective:  Vital signs in last 24 hours: Temp:  [97.7 F (36.5 C)-98 F (36.7 C)] 98 F (36.7 C) (06/07 0532) Pulse Rate:  [92-110] 92 (06/07 0532) Resp:  [16-19] 16 (06/07 0532) BP: (147-151)/(71-77) 147/71 (06/07 0532) SpO2:  [100 %] 100 % (06/07 0532) Weight:  [145 lb 11.6 oz (66.1 kg)] 145 lb 11.6 oz (66.1 kg) (06/07 0500) Weight change: 4 lb 3 oz (1.9 kg) Last BM Date : 03/03/22  Intake/Output from previous day: 06/06 0701 - 06/07 0700 In: 2226.2 [P.O.:220; I.V.:1606.2; IV Piggyback:400] Out: 1050 [Urine:1050]     General appearance: Comfortable appearing without any discomfort.  Appeared chronically ill. Head: Normocephalic without any trauma Oropharynx: Mucous membranes are moist and pink without any thrush or ulcers. Eyes: Pupils are equal and round reactive to light. Lymph nodes: No cervical, supraclavicular, inguinal or axillary lymphadenopathy.   Heart:regular rate and rhythm.  S1 and S2.  Upper and lower extremity edema noted. Lung: Clear without any rhonchi or wheezes.  No dullness to percussion. Abdomin: Soft, nontender, nondistended with good bowel sounds.  No hepatosplenomegaly. Musculoskeletal: No joint deformity or effusion.  Full range of motion noted. Neurological: No deficits noted on motor, sensory and deep tendon reflex exam. Skin: No petechial rash or dryness.  Appeared moist.     Lab Results: Recent Labs    03/03/22 0420 03/04/22 0344  WBC 2.0* 2.2*  HGB 8.0* 8.6*  HCT 26.0* 27.1*  PLT 104* 88*    BMET Recent Labs    03/03/22 0420 03/04/22 0344  NA 147* 141  141  K 4.3 3.8  3.8  CL 119* 116*  115*  CO2 20* 20*  20*  GLUCOSE 144* 169*  165*  BUN 65* 51*  49*   CREATININE 2.90* 2.55*  2.47*  CALCIUM 8.1* 8.1*  8.1*    Studies/Results: DG Swallowing Func-Speech Pathology  Result Date: 03/02/2022 Table formatting from the original result was not included. Objective Swallowing Evaluation: Type of Study: MBS-Modified Barium Swallow Study  Patient Details Name: Clement Deneault MRN: [id number] Date of Birth: 1941-01-20 Today's Date: 03/02/2022 Time: SLP Start Time (ACUTE ONLY): 1215 -SLP Stop Time (ACUTE ONLY): 4742 SLP Time Calculation (min) (ACUTE ONLY): 20 min Past Medical History: Past Medical History: Diagnosis Date  CLL (chronic lymphocytic leukemia) ([place])   chemotherapy  Colon cancer ([place])   Colorectal cancer ([place])   COLORECTAL DX 2/09, treated surgically  Diabetes mellitus   Hypercholesterolemia   Hypertension   Pneumonia   Bilateral pneumonia Past Surgical History: Past Surgical History: Procedure Laterality Date  HEMICOLECTOMY    nasal polyps    S/P nasal surgery HPI: 81yo male admitted 02/19/22 with generalized weakness, recurrent falls. PMH: CLL, DM2, HTN, colorectal cancer (2009), HLD. Recent hospitalization due to poor appetite/intake, FTT.  Pt had COVID and has had cough since his COVID.  MBS on 5/26 completed with recommendation for NPO.Patient and family are declining PEG and repeat MBS planned to aid in decision making regarding PO's.  Subjective: pleasant, cooperative  Recommendations for follow up therapy are one component of a multi-disciplinary discharge planning process, led by the attending physician.  Recommendations may be updated based on patient status, additional functional criteria and insurance authorization.  Assessment / Plan / Recommendation   03/02/2022   1:40 PM Clinical Impressions Clinical Impression Patient presents with some improvements in swallow function as per this repeat MBS as compared to MBS on 02/20/22. He continues to exhibit swallow initiation delays at level of vallecular sinus with all consistencies (puree, honey, nectar,  regular, barium tablet, thin). With thin liquids, patient had initially trace vallecular residuals and moderate pyriform sinus residuals. Pyriform residuals cleared to minimal amount with 2-3 extra/dry swallows. Patient did exhibit very trace penetration during the swallow with thin liquids and trace to mild penetration from barium flowing out of full pyriform sinus but in all instances, penetrate remained above vocal cords and fully excited laryngeal vestibule. Penetration observed after teh swallow with nectar thick liquids (PAS 2) with no aspiration and full clearance of penetrate from laryngeal vestibule. No aspiration obseved prior to, during or after the swallow with any of the tested barium consistencies. Vallecular residuals were trace to mild in amount for thin liquid, nectar thick, honey thick, puree and regular solids; pyriform sinus residuals were moderate in amount for thin liquid and mild for nectar, honey, puree and regular solids. Barium tablet (1/2 of a 23m tablet) became lodged in vallecular sinus and despite multiple trials, was not able to be cleared. Overall, patient appears to be protecting his airway better during the swallow and is able to more effectively clear pharyngeal residuals with extra/dry swallows. He will continue to be at risk for aspiration due to pyriform sinus residuals but aspiration risk can be reduced by using strategies of swallowing an extra 2-3 times for each bite/sip, fully masticating food before swallowing, and sitting upright during and after meals. SLP Visit Diagnosis Dysphagia, unspecified (R13.10) Impact on safety and function Moderate aspiration risk     03/02/2022   1:40 PM Treatment Recommendations Treatment Recommendations Therapy as outlined in treatment plan below     03/02/2022   1:52 PM Prognosis Prognosis for Safe Diet Advancement Good Barriers to Reach Goals Severity of deficits   03/02/2022   1:40 PM Diet Recommendations SLP Diet Recommendations Dysphagia 3  (Mech soft) solids;Thin liquid Liquid Administration via Cup;Straw Medication Administration Crushed with puree Compensations Slow rate;Small sips/bites Postural Changes Seated upright at 90 degrees;Remain semi-upright after after feeds/meals (Comment)     03/02/2022   1:40 PM Other Recommendations Oral Care Recommendations Oral care BID Follow Up Recommendations Home health SLP Assistance recommended at discharge Frequent or constant Supervision/Assistance Functional Status Assessment Patient has had a recent decline in their functional status and demonstrates the ability to make significant improvements in function in a reasonable and predictable amount of time.   03/02/2022   1:40 PM Frequency and Duration  Speech Therapy Frequency (ACUTE ONLY) min 2x/week Treatment Duration 1 week     03/02/2022  12:58 PM Oral Phase Oral Phase [facility]   03/02/2022   1:38 PM Pharyngeal Phase Pharyngeal- Thin Cup Penetration/Aspiration during swallow;Penetration/Apiration after swallow Pharyngeal Material enters airway, remains ABOVE vocal cords then ejected out Pharyngeal- Thin Straw Penetration/Aspiration during swallow;Penetration/Apiration after swallow Pharyngeal Material enters airway, remains ABOVE vocal cords then ejected out    03/02/2022   1:39 PM Cervical Esophageal Phase  Cervical Esophageal Phase Impaired Honey Cup Reduced cricopharyngeal relaxation Nectar Cup Reduced cricopharyngeal relaxation Thin Cup Reduced cricopharyngeal relaxation Thin Straw Reduced cricopharyngeal relaxation Puree Reduced cricopharyngeal relaxation Regular Reduced cricopharyngeal relaxation Pill Other (Comment) JSonia Baller MA, CCC-SLP Speech Therapy  Medications: I have reviewed the patient's current medications.  Assessment/Plan:  81 year old with:  1.  Relapsed CLL with initial diagnosis in 2014.  He has progressed on multiple therapies and most recent is Imbruvica with the symptoms of pancytopenia, lymphadenopathy and  failure to thrive.  His disease status was updated again in detail and treatment choices reviewed.  Chemotherapy or venetoclax remain the salvage therapy options but his performance status is poor and likely will not handle such therapy.  I believe his disease is slowly progressive.  2.  Pancytopenia: Could be multifactorial related to his sepsis and likely CLL infiltration in the bone marrow.  I am not in favor of repeating bone marrow biopsy at this time as my not impact or treatment choices.  3.  Prognosis and goals of care: Prognosis is poor given his progressive disease and poor performance status as well as multiorgan dysfunction.  I recommend continuing the current scope of care with consideration of hospice and placement.  4.  Renal insufficiency: Nephrology is following and I do not believe he would require dialysis nor should he.  5.  Infectious disease considerations: Currently on antibiotics with sepsis physiology appears to have improved.  35  minutes were spent on this visit.  The time was dedicated to reviewing his laboratory data, disease status update and outlining future plan of care discussion.     LOS: 12 days   [name] 03/04/2022, 7:30 AM

## 2022-03-04 NOTE — Progress Notes (Signed)
Physical Therapy Treatment Patient Details Name: [name] MRN: [id number] DOB: 1941-07-31 Today's Date: 03/04/2022   History of Present Illness 81 year old male with history of CLL initially diagnosed in 2014, status postchemotherapy and currently on Imbruvica, DM 2, HTN, colorectal cancer 2009, who came into the hospital with progressive and generalized weakness and admitted 02/19/22 with Bacteremia due to Enterococcus    PT Comments    Pt AxO x 3 very willing/pleasant.  General bed mobility comments: increased time and effort, use of bedrails to self assist.  Unable to sit EOB completely due to rectal pain/increased pressure.  Tends to quickly stand.  General transfer comment: self able with good use of hands to steady self   General Gait Details: tolerated an increased distance with light need for walker.  Good alternating gait.  Safe with turns. Pt eager to go home.  Very supportive family in room.    Recommendations for follow up therapy are one component of a multi-disciplinary discharge planning process, led by the attending physician.  Recommendations may be updated based on patient status, additional functional criteria and insurance authorization.  Follow Up Recommendations  Home health PT     Assistance Recommended at Discharge Intermittent Supervision/Assistance  Patient can return home with the following A little help with walking and/or transfers;A little help with bathing/dressing/bathroom;Assistance with cooking/housework;Assist for transportation   Equipment Recommendations  None recommended by PT    Recommendations for Other Services       Precautions / Restrictions Precautions Precautions: Fall Precaution Comments: -     Mobility  Bed Mobility Overal bed mobility: Modified Independent             General bed mobility comments: increased time and effort, use of bedrails to self assist.  Unable to sit EOB completely due to rectal pain/increased pressure.   Tends to quickly stand.    Transfers Overall transfer level: Needs assistance Equipment used: Rolling walker (2 wheels) Transfers: Sit to/from Stand Sit to Stand: Supervision           General transfer comment: self able with good use of hands to steady self    Ambulation/Gait Ambulation/Gait assistance: Supervision, Min guard Gait Distance (Feet): 115 Feet Assistive device: Rolling walker (2 wheels) Gait Pattern/deviations: Step-through pattern, Decreased stride length Gait velocity: decreased     General Gait Details: tolerated an increased distance with light need for walker.  Good alternating gait.  Safe with turns.   Stairs             Wheelchair Mobility    Modified Rankin (Stroke Patients Only)       Balance                                            Cognition Arousal/Alertness: Awake/alert Behavior During Therapy: WFL for tasks assessed/performed Overall Cognitive Status: Within Functional Limits for tasks assessed                                 General Comments: AxO x 3 very pleasant and willing.  Eager to go home.        Exercises      General Comments        Pertinent Vitals/Pain Pain Assessment Pain Assessment: Faces Faces Pain Scale: Hurts even more Pain Location: periarea rectum Pain Descriptors /  Indicators: Grimacing, Tender Pain Intervention(s): Monitored during session, Repositioned    Home Living                          Prior Function            PT Goals (current goals can now be found in the care plan section) Progress towards PT goals: Progressing toward goals    Frequency    Min 3X/week      PT Plan Current plan remains appropriate    Co-evaluation              AM-PAC PT "6 Clicks" Mobility   Outcome Measure  Help needed turning from your back to your side while in a flat bed without using bedrails?: A Little Help needed moving from lying on your  back to sitting on the side of a flat bed without using bedrails?: A Little Help needed moving to and from a bed to a chair (including a wheelchair)?: A Little Help needed standing up from a chair using your arms (e.g., wheelchair or bedside chair)?: A Little Help needed to walk in hospital room?: A Little Help needed climbing 3-5 steps with a railing? : A Little 6 Click Score: 18    End of Session Equipment Utilized During Treatment: Gait belt Activity Tolerance: Patient tolerated treatment well Patient left: in bed;with call bell/phone within reach;with bed alarm set Nurse Communication: Mobility status PT Visit Diagnosis: Difficulty in walking, not elsewhere classified (R26.2);Muscle weakness (generalized) (M62.81)     Time: 5329-9242 PT Time Calculation (min) (ACUTE ONLY): 16 min  Charges:  $Gait Training: 8-22 mins                    Rica Koyanagi  PTA Acute  Rehabilitation Services Pager      [phone] Office      [phone]

## 2022-03-04 NOTE — Progress Notes (Signed)
PROGRESS NOTE  [name] OEV:[id number] DOB: October 07, 1940 DOA: 02/19/2022 PCP: [name], MD   LOS: 12 days   Brief Narrative / Interim history: 81 year old male with history of CLL initially diagnosed in 2014, status postchemotherapy and currently on Imbruvica, DM 2, HTN, colorectal cancer 2009, who came into the hospital with progressive and generalized weakness.  Patient has been experiencing some weight loss and poor appetite for the past several months, was seen by GI in February as an outpatient and underwent work-up without any significant major findings.  He was taken off Imbruvica in April as it was believed that it may be related to that, however he continued to decline.  He was hospitalized just 2 weeks ago for dehydration, and when he left the hospital he was placed back on his Imbruvica.  While at home he continued to decline and was rehospitalized on 5/25.  Repeat CT scan of the abdomen and pelvis showed recurrence of the CLL, he was also found to be neutropenic, have a fever and have Enterococcus bacteremia.  Oncology and ID consulted. Palliative care has also been consulted for goals of care discussion. Nephrology was consulted on 02/27/2022 for worsening creatinine.  Subjective / 24h Interval events: Doing well this morning, denies any chest pain, denies any abdominal pain, no nausea or vomiting.  Assesement and Plan: Principal Problem:   Bacteremia due to Enterococcus Active Problems:   Neutropenia in patient with hx of CLL   Chronic lymphocytic leukemia (HCC)   AKI (acute kidney injury) ([place])   Dehydration   Colon cancer (HCC)   Protein-calorie malnutrition, severe ([place])   Hypertension   Hypercholesterolemia   Diabetes mellitus ([place])   Sepsis ([place])   Hyponatremia   Stage 3b chronic kidney disease (CKD) (HCC)   Neutropenic fever (HCC)   Refeeding syndrome   Hypophosphatemia   CLL (chronic lymphocytic leukemia) (HCC)   Failure to thrive in adult   Principal  problem Neutropenic fever, sepsis due to Enterococcus bacteremia-patient was febrile in the first 24 after admission, blood cultures speciated Enterococcus.  ID consulted. 2D echo showed normal EF of 60 to 65%, small pericardial effusion,  there was also a nodular calcification in the right coronary cusp likely sclerosis but if high suspicion for SBE, consider TEE.  ID following and recommending TEE, however cardiology felt like the patient is high risk for TEE and this was canceled.  ID recommends ampicillin for 2 weeks from 5/27, until 6/10, and if he is discharged earlier to transition to Zyvox.  Repeat TTE in about 2 weeks, outpatient follow-up with ID   Active problems Goals of care, Failure to thrive, Dehydration/dysphagia/chronic cough, CLL, Physical deconditioning -overall difficult situation and complex case given progression of his CLL with progressive lymphadenopathy and now complicated by persistent neutropenia and Enterococcus bacteremia with concern for infectious endocarditis. Palliative care following. Dr Alen Blew saw him again this morning, poor prognosis due to progressive disease and poor performance status and multiorgan dysfunction.  Recommends to treat the treatable and consideration of hospice.  Patient had an NG tube but it was removed, does not want a PEG.  Currently on a dysphagia 3 diet   Acute kidney injury on CKD stage IIIb -Baseline creatinine 1.4-1.6.  Creatinine improving to 3 range, renal ultrasound negative for hydronephrosis.  Nephrology consulted.  Not a candidate for dialysis, felt to be dehydrated.  Creatinine improving with fluids  Diarrhea -Possibly from tube feeds and antibiotic use.  Imodium as needed.  Patient had a rectal tube  for the last few days which was dislodged on 03/02/22.   Pancytopenia -Due to malignancy.  Patient received a few doses of Granix as per oncology recommendations for neutropenic fever.  Hemoglobin 6.9 on 03/01/2022.  Status post 1 unit packed red  cells transfusion on 03/01/2022.  Hemoglobin 8.6 today, continue to monitor   Hyperlipidemia -Continue statin  Essential hypertension  -continue metoprolol   Hyperlipidemia -Continue statin    Diabetes mellitus type 2 with hyperglycemia -monitor CBGs  CBG (last 3)  Recent Labs    03/03/22 2346 03/04/22 0407 03/04/22 0739  GLUCAP 219* 165* 213*    Severe protein calorie malnutrition/hypoalbuminemia -Dietitian following.  Plan as above   Scheduled Meds:  atorvastatin  40 mg Oral Daily   chlorhexidine  15 mL Mouth Rinse BID   fiber  1 packet Oral TID   guaiFENesin-dextromethorphan  5 mL Oral Q8H   heparin injection (subcutaneous)  5,000 Units Subcutaneous Q8H   insulin aspart  0-9 Units Subcutaneous Q4H   liver oil-zinc oxide   Topical BID   metoprolol tartrate  12.5 mg Oral BID   mirtazapine  7.5 mg Oral QHS   multivitamin with minerals  1 tablet Oral Daily   ondansetron  4 mg Oral Q8H   Or   ondansetron (ZOFRAN) IV  4 mg Intravenous Q8H   pantoprazole  40 mg Oral BID   zinc sulfate  220 mg Oral Daily   Continuous Infusions:  sodium chloride Stopped (02/20/22 0850)   ampicillin (OMNIPEN) IV 2 g (03/04/22 0200)   PRN Meds:.sodium chloride, acetaminophen, labetalol, loperamide, metoprolol tartrate, prochlorperazine  Diet Orders (From admission, onward)     Start     Ordered   03/02/22 1253  DIET DYS 3 Room service appropriate? Yes; Fluid consistency: Thin  Diet effective now       Question Answer Comment  Room service appropriate? Yes   Fluid consistency: Thin      03/02/22 1252            DVT prophylaxis: heparin injection 5,000 Units Start: 03/03/22 1400   Lab Results  Component Value Date   PLT 88 (L) 03/04/2022      Code Status: DNR  Family Communication: no family at bedside   Status is: Inpatient  Remains inpatient appropriate because: severity of illness   Level of care: Progressive  Consultants:  ID Palliative Oncology    Objective: Vitals:   03/03/22 1405 03/03/22 2145 03/04/22 0500 03/04/22 0532  BP: (!) 147/77 (!) 151/73  (!) 147/71  Pulse: 96 (!) 101  92  Resp: '16 19  16  '$ Temp: 97.7 F (36.5 C) 98 F (36.7 C)  98 F (36.7 C)  TempSrc: Oral Oral  Oral  SpO2: 100% 100%  100%  Weight:   66.1 kg   Height:        Intake/Output Summary (Last 24 hours) at 03/04/2022 1100 Last data filed at 03/04/2022 0544 Gross per 24 hour  Intake 2226.2 ml  Output 1050 ml  Net 1176.2 ml   Wt Readings from Last 3 Encounters:  03/04/22 66.1 kg  02/09/22 52.8 kg  01/12/22 54.9 kg    Examination:  Constitutional: NAD Eyes: no scleral icterus ENMT: Mucous membranes are moist.  Neck: normal, supple Respiratory: clear to auscultation bilaterally, no wheezing, no crackles. Normal respiratory effort.  Cardiovascular: Regular rate and rhythm, no murmurs / rubs / gallops. 1+ LE edema Abdomen: non distended, no tenderness. Bowel sounds positive.  Musculoskeletal: no clubbing /  cyanosis.  Skin: no rashes Neurologic: non focal   Data Reviewed: I have independently reviewed following labs and imaging studies  CBC Recent Labs  Lab 02/28/22 0439 03/01/22 0358 03/01/22 1811 03/02/22 0357 03/03/22 0420 03/04/22 0344  WBC 3.1* 1.8*  --  2.2* 2.0* 2.2*  HGB 8.0* 6.9* 8.8* 8.7* 8.0* 8.6*  HCT 26.4* 22.4* 27.1* 27.3* 26.0* 27.1*  PLT 102* 90*  --  106* 104* 88*  MCV 88.6 85.8  --  87.2 88.7 87.1  MCH 26.8 26.4  --  27.8 27.3 27.7  MCHC 30.3 30.8  --  31.9 30.8 31.7  RDW 18.6* 18.3*  --  17.4* 17.1* 16.8*  LYMPHSABS 1.1 0.7  --  0.7 0.6* 0.9  MONOABS 0.2 0.1  --  0.1 0.1 0.1  EOSABS 0.0 0.0  --  0.0 0.0 0.1  BASOSABS 0.0 0.0  --  0.0 0.0 0.0    Recent Labs  Lab 02/27/22 0352 02/28/22 0439 03/01/22 0358 03/02/22 0357 03/03/22 0420 03/04/22 0344  NA 138 138 142 147* 147* 141  141  K 4.3 4.6 4.7 4.9 4.3 3.8  3.8  CL 113* 113* 117* 122* 119* 116*  115*  CO2 20* 19* 21* 20* 20* 20*  20*  GLUCOSE  179* 214* 105* 82 144* 169*  165*  BUN 86* 88* 86* 79* 65* 51*  49*  CREATININE 3.22* 3.59* 3.45* 3.26* 2.90* 2.55*  2.47*  CALCIUM 6.4* 6.7* 7.2* 8.1* 8.1* 8.1*  8.1*  AST 23  --  22  --   --   --   ALT 26  --  22  --   --   --   ALKPHOS 109  --  95  --   --   --   BILITOT 0.6  --  0.7  --   --   --   ALBUMIN 1.5*  --  2.3*  --  2.9* 2.6*  MG 2.0 2.1 2.1 2.2 2.0 1.8    ------------------------------------------------------------------------------------------------------------------ No results for input(s): CHOL, HDL, LDLCALC, TRIG, CHOLHDL, LDLDIRECT in the last 72 hours.  Lab Results  Component Value Date   HGBA1C 6.9 (H) 02/09/2022   ------------------------------------------------------------------------------------------------------------------ No results for input(s): TSH, T4TOTAL, T3FREE, THYROIDAB in the last 72 hours.  Invalid input(s): FREET3  Cardiac Enzymes No results for input(s): CKMB, TROPONINI, MYOGLOBIN in the last 168 hours.  Invalid input(s): CK ------------------------------------------------------------------------------------------------------------------ No results found for: BNP  CBG: Recent Labs  Lab 03/03/22 1548 03/03/22 1940 03/03/22 2346 03/04/22 0407 03/04/22 0739  GLUCAP 216* 206* 219* 165* 213*    No results found for this or any previous visit (from the past 240 hour(s)).   Radiology Studies: No results found.   Marzetta Board, MD, PhD [facility]  Between 7 am - 7 pm I am available, please contact me via Amion (for emergencies) or Securechat (non urgent messages)  Between 7 pm - 7 am I am not available, please contact night coverage MD/APP via Amion

## 2022-03-04 NOTE — Progress Notes (Signed)
I discussed these findings with Mrs. Trumbull via phone and answered her questions.  She understands that he is facing poor prognosis with limited life expectancy based on his CLL progression and the major decline in his performance status.

## 2022-03-04 NOTE — Progress Notes (Signed)
Palliative:  Chart reviewed; specifically Dr. Hazeline Junker conversations with family - reached out to patient's spouse, Deneise Lever - they are interested in reengaging with our team but she tells me today they are processing the situation and visiting with family members so that would appreciate if we wait until tomorrow afternoon to revisit. I will request a team member follow up tomorrow afternoon.  Juel Burrow, DNP, AGNP-C Palliative Medicine Team Team Phone # [phone]  Pager # [phone]  NO CHARGE

## 2022-03-04 NOTE — Care Management Important Message (Signed)
Important Message  Patient Details IM Letter given to the Patient. Name: [name] MRN: [id number] Date of Birth: 1941-06-29   Medicare Important Message Given:  Yes     Kerin Salen 03/04/2022, 12:27 PM

## 2022-03-04 NOTE — Progress Notes (Signed)
Nutrition Follow-up  DOCUMENTATION CODES:   Severe malnutrition in context of chronic illness, Underweight  INTERVENTION:  - will order Boost Breeze BID, each supplement provides 250 kcal and 9 grams of protein. - will order Ensure Plus High Protein BID, each supplement provides 350 kcal and 20 grams of protein.    NUTRITION DIAGNOSIS:   Severe Malnutrition related to chronic illness, cancer and cancer related treatments as evidenced by moderate fat depletion, severe muscle depletion, percent weight loss. -ongoing  GOAL:   Patient will meet greater than or equal to 90% of their needs -progressing  MONITOR:   PO intake, Supplement acceptance, Labs, Weight trends, Skin  ASSESSMENT:   81 year old male with medical history of CLL initially diagnosed in 2014, s/p chemo and currently on Imbruvica, type 2 DM, HTN, colorectal cancer in 2009, and hypercholesterolemia. He presented to the ED due to progressive generalized weakness. He has been experiencing weight loss and poor appetite for several months. He was seen by GI outpatient in 10/2021 and workup showed no significant findings. He was taken off Imbruvica in 12/2021 as it was thought to be the cause but he continued to decline. He was admitted 2 weeks ago and returned to the ED on 5/25. He was restarted on Imbruvica after recent discharge. Repeat CT scan of the abdomen and pelvis on 5/25 shows recurrence of his CLL. Oncology consulted. He was admitted for dehydration.  Small bore NGT removed on 6/3 d/t dislodging from 60 cm to 45 cm and attempts to advance unsuccessful and tip remained at GE junction.   Diet advanced from NPO to Dysphagia 3, thin liquids on 6/5 at 1253 at the recommendation of SLP following assessment.  Flow sheet documentation indicates 100% completion of lunch on 6/5 (594 kcal and 31 grams protein) and 25% of dinner on 6/6 (124 kcal and 7 grams protein).  Patient laying in bed with no visitors present at the time of  RD visit. He shares that he ate 2 bowls of cereal with milk for breakfast this AM. He is very happy to be able to eat and have small bore NGT out. He denies oral or esophageal pain and denies any chewing or swallowing difficulties on current diet order.  Weight was stable 5/26-5/28, then trending up, and has now been stable 6/2-today. Non-pitting edema to all extremities documented in the edema section of flow sheet.    Labs reviewed; CBGs: 165, 213, 240 mg/dl, Cl: 115, BUN: 49 mg/dl, creatinine: 2.47 mg/dl, Ca: 8.1 mg/dl, GFR: 26 ml/min.  Medications reviewed; 1 packet nutrisource fiber TID, sliding scale novolog, 1 tablet multivitamin with minerals/day, 40 mg oral protonix BID, 220 mg zinc sulfate/day.    Diet Order:   Diet Order             DIET DYS 3 Room service appropriate? Yes; Fluid consistency: Thin  Diet effective now                   EDUCATION NEEDS:   Education needs have been addressed  Skin:  Skin Assessment: Skin Integrity Issues: Skin Integrity Issues:: Other (Comment) Other: IAD to bilateral buttocks (newly documented on 6/4)  Last BM:  6/7 (type 6 x2, one small and one large amount)  Height:   Ht Readings from Last 1 Encounters:  02/20/22 '5\' 7"'$  (1.702 m)    Weight:   Wt Readings from Last 1 Encounters:  03/04/22 66.1 kg    BMI:  Body mass index is 22.82 kg/m.  Estimated Nutritional Needs:  Kcal:  2000-2200 kcal Protein:  100-115 grams Fluid:  >/= 2.2 L/day     Jarome Matin, MS, RD, LDN Registered Dietitian II Inpatient Clinical Nutrition RD pager # and on-call/weekend pager # available in [facility]

## 2022-03-05 DIAGNOSIS — R627 Adult failure to thrive: Secondary | ICD-10-CM | POA: Diagnosis not present

## 2022-03-05 DIAGNOSIS — Z515 Encounter for palliative care: Secondary | ICD-10-CM | POA: Diagnosis not present

## 2022-03-05 DIAGNOSIS — R531 Weakness: Secondary | ICD-10-CM

## 2022-03-05 DIAGNOSIS — E43 Unspecified severe protein-calorie malnutrition: Secondary | ICD-10-CM | POA: Diagnosis not present

## 2022-03-05 DIAGNOSIS — R7881 Bacteremia: Secondary | ICD-10-CM | POA: Diagnosis not present

## 2022-03-05 LAB — CBC WITH DIFFERENTIAL/PLATELET
Abs Immature Granulocytes: 0.02 10*3/uL (ref 0.00–0.07)
Basophils Absolute: 0 10*3/uL (ref 0.0–0.1)
Basophils Relative: 0 %
Eosinophils Absolute: 0.1 10*3/uL (ref 0.0–0.5)
Eosinophils Relative: 3 %
HCT: 24.6 % — ABNORMAL LOW (ref 39.0–52.0)
Hemoglobin: 7.9 g/dL — ABNORMAL LOW (ref 13.0–17.0)
Immature Granulocytes: 1 %
Lymphocytes Relative: 44 %
Lymphs Abs: 1 10*3/uL (ref 0.7–4.0)
MCH: 27.9 pg (ref 26.0–34.0)
MCHC: 32.1 g/dL (ref 30.0–36.0)
MCV: 86.9 fL (ref 80.0–100.0)
Monocytes Absolute: 0.2 10*3/uL (ref 0.1–1.0)
Monocytes Relative: 7 %
Neutro Abs: 1 10*3/uL — ABNORMAL LOW (ref 1.7–7.7)
Neutrophils Relative %: 45 %
Platelets: 83 10*3/uL — ABNORMAL LOW (ref 150–400)
RBC: 2.83 MIL/uL — ABNORMAL LOW (ref 4.22–5.81)
RDW: 16.2 % — ABNORMAL HIGH (ref 11.5–15.5)
WBC: 2.3 10*3/uL — ABNORMAL LOW (ref 4.0–10.5)
nRBC: 0 % (ref 0.0–0.2)

## 2022-03-05 LAB — COMPREHENSIVE METABOLIC PANEL
ALT: 30 U/L (ref 0–44)
AST: 24 U/L (ref 15–41)
Albumin: 2.5 g/dL — ABNORMAL LOW (ref 3.5–5.0)
Alkaline Phosphatase: 90 U/L (ref 38–126)
Anion gap: 6 (ref 5–15)
BUN: 41 mg/dL — ABNORMAL HIGH (ref 8–23)
CO2: 20 mmol/L — ABNORMAL LOW (ref 22–32)
Calcium: 7.9 mg/dL — ABNORMAL LOW (ref 8.9–10.3)
Chloride: 116 mmol/L — ABNORMAL HIGH (ref 98–111)
Creatinine, Ser: 2.42 mg/dL — ABNORMAL HIGH (ref 0.61–1.24)
GFR, Estimated: 27 mL/min — ABNORMAL LOW (ref >60)
Glucose, Bld: 130 mg/dL — ABNORMAL HIGH (ref 70–99)
Potassium: 4.2 mmol/L (ref 3.5–5.1)
Sodium: 142 mmol/L (ref 135–145)
Total Bilirubin: 0.6 mg/dL (ref 0.3–1.2)
Total Protein: 4.2 g/dL — ABNORMAL LOW (ref 6.5–8.1)

## 2022-03-05 LAB — PHOSPHORUS: Phosphorus: 3.1 mg/dL (ref 2.5–4.6)

## 2022-03-05 LAB — RENAL FUNCTION PANEL
Albumin: 2.4 g/dL — ABNORMAL LOW (ref 3.5–5.0)
Anion gap: 7 (ref 5–15)
BUN: 41 mg/dL — ABNORMAL HIGH (ref 8–23)
CO2: 19 mmol/L — ABNORMAL LOW (ref 22–32)
Calcium: 7.9 mg/dL — ABNORMAL LOW (ref 8.9–10.3)
Chloride: 116 mmol/L — ABNORMAL HIGH (ref 98–111)
Creatinine, Ser: 2.38 mg/dL — ABNORMAL HIGH (ref 0.61–1.24)
GFR, Estimated: 27 mL/min — ABNORMAL LOW (ref >60)
Glucose, Bld: 130 mg/dL — ABNORMAL HIGH (ref 70–99)
Phosphorus: 3.2 mg/dL (ref 2.5–4.6)
Potassium: 4.2 mmol/L (ref 3.5–5.1)
Sodium: 142 mmol/L (ref 135–145)

## 2022-03-05 LAB — MAGNESIUM: Magnesium: 1.8 mg/dL (ref 1.7–2.4)

## 2022-03-05 LAB — GLUCOSE, CAPILLARY
Glucose-Capillary: 107 mg/dL — ABNORMAL HIGH (ref 70–99)
Glucose-Capillary: 133 mg/dL — ABNORMAL HIGH (ref 70–99)
Glucose-Capillary: 166 mg/dL — ABNORMAL HIGH (ref 70–99)
Glucose-Capillary: 177 mg/dL — ABNORMAL HIGH (ref 70–99)
Glucose-Capillary: 194 mg/dL — ABNORMAL HIGH (ref 70–99)
Glucose-Capillary: 230 mg/dL — ABNORMAL HIGH (ref 70–99)

## 2022-03-05 MED ORDER — OXYCODONE HCL 5 MG PO TABS
5.0000 mg | ORAL_TABLET | ORAL | Status: DC | PRN
  Administered 2022-03-05: 5 mg via ORAL
  Filled 2022-03-05: qty 1

## 2022-03-05 NOTE — Progress Notes (Signed)
Daily Progress Note   Patient Name: [name]       Date: 03/05/2022 DOB: [birth year]  Age: 81 y.o. MRN#: [id number] Attending Physician: Caren Griffins, MD Primary Care Physician: [name], MD Admit Date: 02/19/2022  Reason for Consultation/Follow-up: Establishing goals of care  Subjective: Awake alert oriented resting in bed.  Wife present at bedside.  Patient states that he is feeling better and that he also participated some with physical therapy.  He states that his oral intake is reasonable, he is trying to increase his oral intake.  Complains of pain/discomfort in his buttocks area.  Goals of care discussions undertaken with wife and patient, see below.  Length of Stay: 13  Current Medications: Scheduled Meds:   atorvastatin  40 mg Oral Daily   chlorhexidine  15 mL Mouth Rinse BID   feeding supplement  1 Container Oral BID BM   feeding supplement  237 mL Oral BID BM   fiber  1 packet Oral TID   guaiFENesin-dextromethorphan  5 mL Oral Q8H   heparin injection (subcutaneous)  5,000 Units Subcutaneous Q8H   insulin aspart  0-9 Units Subcutaneous Q4H   liver oil-zinc oxide   Topical BID   metoprolol tartrate  12.5 mg Oral BID   mirtazapine  7.5 mg Oral QHS   multivitamin with minerals  1 tablet Oral Daily   ondansetron  4 mg Oral Q8H   Or   ondansetron (ZOFRAN) IV  4 mg Intravenous Q8H   pantoprazole  40 mg Oral BID   zinc sulfate  220 mg Oral Daily    Continuous Infusions:  sodium chloride 10 mL/hr at 03/04/22 1125   ampicillin (OMNIPEN) IV 2 g (03/05/22 1019)    PRN Meds: sodium chloride, acetaminophen, labetalol, loperamide, metoprolol tartrate, oxyCODONE, prochlorperazine  Physical Exam         Awake alert oriented Resting in bed Admits to back/buttocks  discomfort No edema No focal deficits Regular work of breathing S1-S2 Abdomen is nondistended  Vital Signs: BP (!) 151/77 (BP Location: Right Arm)   Pulse 86   Temp 97.8 F (36.6 C) (Oral)   Resp 16   Ht '5\' 7"'$  (1.702 m)   Wt 64 kg   SpO2 100%   BMI 22.10 kg/m  SpO2: SpO2: 100 % O2 Device: O2 Device: Room  Air O2 Flow Rate:    Intake/output summary:  Intake/Output Summary (Last 24 hours) at 03/05/2022 1534 Last data filed at 03/05/2022 1200 Gross per 24 hour  Intake 990 ml  Output 1900 ml  Net -910 ml   LBM: Last BM Date : (S) 03/04/22 Baseline Weight: Weight: 51 kg Most recent weight: Weight: 64 kg       Palliative Assessment/Data:      Patient Active Problem List   Diagnosis Date Noted   Refeeding syndrome 02/22/2022   Hypophosphatemia 02/22/2022   Bacteremia due to Enterococcus 02/22/2022   CLL (chronic lymphocytic leukemia) ([place])    Failure to thrive in adult    Neutropenic fever (HCC) 02/20/2022   Hyponatremia 02/19/2022   Stage 3b chronic kidney disease (CKD) ([place]) 02/19/2022   Physical deconditioning 02/10/2022   Goals of care, counseling/discussion 02/10/2022   Nausea, vomiting and diarrhea 02/10/2022   Diarrhea 02/10/2022   Sepsis ([place]) 02/09/2022   Pancytopenia ([place]) 02/09/2022   Neutropenia in patient with hx of CLL 02/09/2022   Protein-calorie malnutrition, severe ([place]) 02/09/2022   AKI (acute kidney injury) ([place]) 02/09/2022   Dehydration 02/09/2022   Pneumonia due to COVID-19 virus 10/22/2020   SIRS (systemic inflammatory response syndrome) ([place]) 03/25/2019   Hypertensive urgency 03/25/2019   Thrombocytopenia ([place]) 03/25/2019   Influenza A 09/18/2013   Syncope 09/17/2013   Diabetes mellitus ([place]) 09/17/2013   Hypertension    Hypercholesterolemia    Chronic lymphocytic leukemia ([place]) 08/01/2011   Colon cancer ([place]) 08/01/2011    Palliative Care Assessment & Plan   Patient Profile:    Assessment: 81 year old gentleman who lives at  home with his wife, history of CLL, admitted with progressive generalized weakness, has been experiencing weight loss and poor appetite for the past several months. Patient admitted to hospital medicine service for neutropenic fever, sepsis secondary to Enterococcus bacteremia.  Patient with ongoing physical deconditioning and failure to thrive, palliative medicine team consulted for goals of care discussions.  Recommendations/Plan: Goals of care discussions undertaken with patient and wife.  Goals wishes and values attempted to be explored.  Discussed about the burden of his cancer and also the burden of his cancer treatments.  Discussed about his overall performance status.  Discussed about differences between hospice versus palliative mode of care.  Opportunity given for asking questions and for reflection.  We compared and contrasted home with hospice versus home with home-based PT/OT efforts in conjunction with home-based palliative care. At this time, patient and wife wish to have continuation of efforts towards physical therapy, continuation of efforts towards improving his functional status and towards improving his nutritional status and oral intake.  They are not accepting of hospice philosophy of care at this point in time.  Recommend home-based health care, PT/OT and home-based palliative support.  Low-dose oral oxycodone will be added on an as-needed basis.  TOC consult requested as well.    Code Status:    Code Status Orders  (From admission, onward)           Start     Ordered   02/19/22 1334  Do not attempt resuscitation (DNR)  Continuous       Question Answer Comment  In the event of cardiac or respiratory ARREST Do not call a "code blue"   In the event of cardiac or respiratory ARREST Do not perform Intubation, CPR, defibrillation or ACLS   In the event of cardiac or respiratory ARREST Use medication by any route, position, wound care, and  other measures to relive pain and  suffering. May use oxygen, suction and manual treatment of airway obstruction as needed for comfort.      02/19/22 1335           Code Status History     Date Active Date Inactive Code Status Order ID Comments User Context   02/09/2022 1617 02/11/2022 1905 DNR [id number]  Jonnie Finner, DO Inpatient   10/23/2020 0009 10/25/2020 1920 DNR [id number]  Toy Baker, MD ED   03/25/2019 0926 03/27/2019 1635 DNR [id number]  Darliss Cheney, MD ED   09/17/2013 0931 09/18/2013 1518 DNR [id number]  Janece Canterbury, MD Inpatient      Advance Directive Documentation    Flowsheet Row Most Recent Value  Type of Advance Directive Healthcare Power of Attorney  Pre-existing out of facility DNR order (yellow form or pink MOST form) --  "MOST" Form in Place? --       Prognosis:  Unable to determine  Discharge Planning: Home with Home Health With outpatient palliative services Care plan was discussed with patient and wife.  Thank you for allowing the Palliative Medicine Team to assist in the care of this patient.                    Greater than 50%  of this time was spent counseling and coordinating care related to the above assessment and plan.  Loistine Chance, MD  Please contact Palliative Medicine Team phone at [phone] for questions and concerns.

## 2022-03-05 NOTE — Progress Notes (Signed)
PROGRESS NOTE  [name] BOF:[id number] DOB: [birth year] DOA: 02/19/2022 PCP: [name], MD   LOS: 13 days   Brief Narrative / Interim history: 81 year old male with history of CLL initially diagnosed in 2014, status postchemotherapy and currently on Imbruvica, DM 2, HTN, colorectal cancer 2009, who came into the hospital with progressive and generalized weakness.  Patient has been experiencing some weight loss and poor appetite for the past several months, was seen by GI in February as an outpatient and underwent work-up without any significant major findings.  He was taken off Imbruvica in April as it was believed that it may be related to that, however he continued to decline.  He was hospitalized just 2 weeks ago for dehydration, and when he left the hospital he was placed back on his Imbruvica.  While at home he continued to decline and was rehospitalized on 5/25.  Repeat CT scan of the abdomen and pelvis showed recurrence of the CLL, he was also found to be neutropenic, have a fever and have Enterococcus bacteremia.  Oncology and ID consulted. Palliative care has also been consulted for goals of care discussion. Nephrology was consulted on 02/27/2022 for worsening creatinine.  Subjective / 24h Interval events: He reports feeling better, has more energy.  He ate more yesterday and was able to drink without difficulties.  Assesement and Plan: Principal Problem:   Bacteremia due to Enterococcus Active Problems:   Neutropenia in patient with hx of CLL   Chronic lymphocytic leukemia (HCC)   AKI (acute kidney injury) ([place])   Dehydration   Colon cancer (HCC)   Protein-calorie malnutrition, severe ([place])   Hypertension   Hypercholesterolemia   Diabetes mellitus ([place])   Sepsis ([place])   Hyponatremia   Stage 3b chronic kidney disease (CKD) (HCC)   Neutropenic fever (HCC)   Refeeding syndrome   Hypophosphatemia   CLL (chronic lymphocytic leukemia) (HCC)   Failure to thrive in  adult   Principal problem Neutropenic fever, sepsis due to Enterococcus bacteremia-patient was febrile in the first 24 after admission, blood cultures speciated Enterococcus.  ID consulted. 2D echo showed normal EF of 60 to 65%, small pericardial effusion,  there was also a nodular calcification in the right coronary cusp likely sclerosis but if high suspicion for SBE, consider TEE.  ID following and recommending TEE, however cardiology felt like the patient is high risk for TEE and this was canceled.  ID recommends ampicillin for 2 weeks from 5/27, until 6/10, and if he is discharged earlier to transition to Zyvox.  Repeat TTE in about 2 weeks, outpatient follow-up with ID.  Given that he is still here, repeat TEE tomorrow as he will be close to 2 weeks from original TEE   Active problems Goals of care, Failure to thrive, Dehydration/dysphagia/chronic cough, CLL, Physical deconditioning -overall difficult situation and complex case given progression of his CLL with progressive lymphadenopathy and now complicated by persistent neutropenia and Enterococcus bacteremia with concern for infectious endocarditis. Palliative care following. Dr Alen Blew saw him again this morning, poor prognosis due to progressive disease and poor performance status and multiorgan dysfunction.  Recommends to treat the treatable and consideration of hospice.  Patient had an NG tube but it was removed, does not want a PEG.  Currently on a dysphagia 3 diet and seems to be eating better   Acute kidney injury on CKD stage IIIb -Baseline creatinine 1.4-1.6.  Creatinine improving, now off IV fluids and seems to be doing well with p.o. intake  Diarrhea -  Possibly from tube feeds and antibiotic use.  Imodium as needed.  Patient had a rectal tube for the last few days which was dislodged on 03/02/22.   Pancytopenia -Due to malignancy.  Patient received a few doses of Granix as per oncology recommendations for neutropenic fever.  Hemoglobin  6.9 on 03/01/2022.  Status post 1 unit packed red cells transfusion on 03/01/2022.  Hemoglobin 7.9 today.  Continue to monitor.  Platelets 83.  No bleeding   Hyperlipidemia -Continue statin  Essential hypertension  -continue metoprolol   Hyperlipidemia -Continue statin    Diabetes mellitus type 2 with hyperglycemia -monitor CBGs  CBG (last 3)  Recent Labs    03/05/22 0010 03/05/22 0352 03/05/22 0758  GLUCAP 177* 133* 107*     Severe protein calorie malnutrition/hypoalbuminemia -Dietitian following.  Plan as above   Scheduled Meds:  atorvastatin  40 mg Oral Daily   chlorhexidine  15 mL Mouth Rinse BID   feeding supplement  1 Container Oral BID BM   feeding supplement  237 mL Oral BID BM   fiber  1 packet Oral TID   guaiFENesin-dextromethorphan  5 mL Oral Q8H   heparin injection (subcutaneous)  5,000 Units Subcutaneous Q8H   insulin aspart  0-9 Units Subcutaneous Q4H   liver oil-zinc oxide   Topical BID   metoprolol tartrate  12.5 mg Oral BID   mirtazapine  7.5 mg Oral QHS   multivitamin with minerals  1 tablet Oral Daily   ondansetron  4 mg Oral Q8H   Or   ondansetron (ZOFRAN) IV  4 mg Intravenous Q8H   pantoprazole  40 mg Oral BID   zinc sulfate  220 mg Oral Daily   Continuous Infusions:  sodium chloride 10 mL/hr at 03/04/22 1125   ampicillin (OMNIPEN) IV 2 g (03/05/22 0100)   PRN Meds:.sodium chloride, acetaminophen, labetalol, loperamide, metoprolol tartrate, prochlorperazine  Diet Orders (From admission, onward)     Start     Ordered   03/02/22 1253  DIET DYS 3 Room service appropriate? Yes; Fluid consistency: Thin  Diet effective now       Question Answer Comment  Room service appropriate? Yes   Fluid consistency: Thin      03/02/22 1252            DVT prophylaxis: heparin injection 5,000 Units Start: 03/03/22 1400   Lab Results  Component Value Date   PLT 83 (L) 03/05/2022      Code Status: DNR  Family Communication: Wife present at  bedside  Status is: Inpatient  Remains inpatient appropriate because: severity of illness   Level of care: Progressive  Consultants:  ID Palliative Oncology   Objective: Vitals:   03/04/22 0532 03/04/22 1957 03/05/22 0500 03/05/22 0600  BP: (!) 147/71 (!) 154/77  137/77  Pulse: 92 98  93  Resp: '16 19  20  '$ Temp: 98 F (36.7 C) 98.6 F (37 C)  98.3 F (36.8 C)  TempSrc: Oral Oral  Oral  SpO2: 100% 100%  100%  Weight:   64 kg   Height:        Intake/Output Summary (Last 24 hours) at 03/05/2022 1014 Last data filed at 03/05/2022 0900 Gross per 24 hour  Intake 870 ml  Output 1300 ml  Net -430 ml    Wt Readings from Last 3 Encounters:  03/05/22 64 kg  02/09/22 52.8 kg  01/12/22 54.9 kg    Examination:  Constitutional: NAD Eyes: lids and conjunctivae normal, no scleral icterus  ENMT: mmm Neck: normal, supple Respiratory: clear to auscultation bilaterally, no wheezing, no crackles. Normal respiratory effort.  Cardiovascular: Regular rate and rhythm, no murmurs / rubs / gallops.  1+ edema Abdomen: soft, no distention, no tenderness. Bowel sounds positive.  Skin: no rashes Neurologic: no focal deficits, equal strength  Data Reviewed: I have independently reviewed following labs and imaging studies  CBC Recent Labs  Lab 03/01/22 0358 03/01/22 1811 03/02/22 0357 03/03/22 0420 03/04/22 0344 03/05/22 0400  WBC 1.8*  --  2.2* 2.0* 2.2* 2.3*  HGB 6.9* 8.8* 8.7* 8.0* 8.6* 7.9*  HCT 22.4* 27.1* 27.3* 26.0* 27.1* 24.6*  PLT 90*  --  106* 104* 88* 83*  MCV 85.8  --  87.2 88.7 87.1 86.9  MCH 26.4  --  27.8 27.3 27.7 27.9  MCHC 30.8  --  31.9 30.8 31.7 32.1  RDW 18.3*  --  17.4* 17.1* 16.8* 16.2*  LYMPHSABS 0.7  --  0.7 0.6* 0.9 1.0  MONOABS 0.1  --  0.1 0.1 0.1 0.2  EOSABS 0.0  --  0.0 0.0 0.1 0.1  BASOSABS 0.0  --  0.0 0.0 0.0 0.0     Recent Labs  Lab 02/27/22 0352 02/28/22 0439 03/01/22 0358 03/02/22 0357 03/03/22 0420 03/04/22 0344 03/05/22 0400  NA  138   < > 142 147* 147* 141  141 142  142  K 4.3   < > 4.7 4.9 4.3 3.8  3.8 4.2  4.2  CL 113*   < > 117* 122* 119* 116*  115* 116*  116*  CO2 20*   < > 21* 20* 20* 20*  20* 19*  20*  GLUCOSE 179*   < > 105* 82 144* 169*  165* 130*  130*  BUN 86*   < > 86* 79* 65* 51*  49* 41*  41*  CREATININE 3.22*   < > 3.45* 3.26* 2.90* 2.55*  2.47* 2.38*  2.42*  CALCIUM 6.4*   < > 7.2* 8.1* 8.1* 8.1*  8.1* 7.9*  7.9*  AST 23  --  22  --   --   --  24  ALT 26  --  22  --   --   --  30  ALKPHOS 109  --  95  --   --   --  90  BILITOT 0.6  --  0.7  --   --   --  0.6  ALBUMIN 1.5*  --  2.3*  --  2.9* 2.6* 2.4*  2.5*  MG 2.0   < > 2.1 2.2 2.0 1.8 1.8   < > = values in this interval not displayed.     ------------------------------------------------------------------------------------------------------------------ No results for input(s): "CHOL", "HDL", "LDLCALC", "TRIG", "CHOLHDL", "LDLDIRECT" in the last 72 hours.  Lab Results  Component Value Date   HGBA1C 6.9 (H) 02/09/2022   ------------------------------------------------------------------------------------------------------------------ No results for input(s): "TSH", "T4TOTAL", "T3FREE", "THYROIDAB" in the last 72 hours.  Invalid input(s): "FREET3"  Cardiac Enzymes No results for input(s): "CKMB", "TROPONINI", "MYOGLOBIN" in the last 168 hours.  Invalid input(s): "CK" ------------------------------------------------------------------------------------------------------------------ No results found for: "BNP"  CBG: Recent Labs  Lab 03/04/22 1710 03/04/22 1946 03/05/22 0010 03/05/22 0352 03/05/22 0758  GLUCAP 175* 129* 177* 133* 107*     No results found for this or any previous visit (from the past 240 hour(s)).   Radiology Studies: No results found.   Marzetta Board, MD, PhD [facility]  Between 7 am - 7 pm I am available, please contact  me via Amion (for emergencies) or Securechat (non urgent  messages)  Between 7 pm - 7 am I am not available, please contact night coverage MD/APP via Amion

## 2022-03-05 NOTE — Progress Notes (Signed)
Occupational Therapy Treatment Patient Details Name: [name] MRN: [id number] DOB: [birth year] Today's Date: 03/05/2022   History of present illness 81 year old male with history of CLL initially diagnosed in 2014, status postchemotherapy and currently on Imbruvica, DM 2, HTN, colorectal cancer 2009, who came into the hospital with progressive and generalized weakness and admitted 02/19/22 with Bacteremia due to Enterococcus   OT comments  Pt making excellent progress toward all adls goals and mobility goals. Pt continues to be limited by mild balance deficits but is improving.  Pt with better activity tolerance and great family support.     Recommendations for follow up therapy are one component of a multi-disciplinary discharge planning process, led by the attending physician.  Recommendations may be updated based on patient status, additional functional criteria and insurance authorization.    Follow Up Recommendations  Home health OT    Assistance Recommended at Discharge Frequent or constant Supervision/Assistance  Patient can return home with the following  A little help with walking and/or transfers;A little help with bathing/dressing/bathroom;Assist for transportation;Assistance with cooking/housework;Help with stairs or ramp for entrance;Direct supervision/assist for medications management   Equipment Recommendations  BSC/3in1    Recommendations for Other Services      Precautions / Restrictions Precautions Precautions: Fall Restrictions Weight Bearing Restrictions: No       Mobility Bed Mobility Overal bed mobility: Modified Independent             General bed mobility comments: Pt moves quickly into standing due to rectal pain.    Transfers Overall transfer level: Needs assistance Equipment used: Rolling walker (2 wheels) Transfers: Sit to/from Stand Sit to Stand: Supervision Stand pivot transfers: Supervision         General transfer comment: Pt  with increased balance during transfers from last session.     Balance Overall balance assessment: Needs assistance Sitting-balance support: No upper extremity supported, Feet supported Sitting balance-Leahy Scale: Good     Standing balance support: During functional activity, Reliant on assistive device for balance, No upper extremity supported Standing balance-Leahy Scale: Fair Standing balance comment: Able to groom at sink for 3 min                           ADL either performed or assessed with clinical judgement   ADL Overall ADL's : Needs assistance/impaired Eating/Feeding: Independent;Sitting   Grooming: Wash/dry hands;Wash/dry face;Oral care;Supervision/safety;Standing Grooming Details (indicate cue type and reason): Supervision for balance only.  Cues to bring walker all the way to sink and stand inside of it.         Upper Body Dressing : Supervision/safety;Sitting   Lower Body Dressing: Min guard;Sit to/from stand;Cueing for compensatory techniques Lower Body Dressing Details (indicate cue type and reason): pt able to donn socks easily. Required min guard to stand and manage clothing. Toilet Transfer: Press photographer (2 wheels);Ambulation Toilet Transfer Details (indicate cue type and reason): Pt walked to bathroom with walker and min guard. Spoke to nursing about removing purwick catheter. Pt is mobilizing well and would like pt to walk to bathroom with supervision our use urinal if urgency is an issue. Toileting- Clothing Manipulation and Hygiene: Sit to/from stand;Min guard;Set up       Functional mobility during ADLs: Supervision/safety;Min guard;Rolling walker (2 wheels) General ADL Comments: Pt with increased balance during adls today.  No overt loss of balance during all adls. Spoke with pt about keeping walker close during adls in case of  LOB.  Encouraged pt to walk to bathroom to urinate and call before going.     Extremity/Trunk Assessment Upper Extremity Assessment Upper Extremity Assessment: Overall WFL for tasks assessed   Lower Extremity Assessment Lower Extremity Assessment: Defer to PT evaluation        Vision   Vision Assessment?: No apparent visual deficits   Perception Perception Perception: Within Functional Limits   Praxis Praxis Praxis: Intact    Cognition Arousal/Alertness: Awake/alert Behavior During Therapy: WFL for tasks assessed/performed Overall Cognitive Status: Within Functional Limits for tasks assessed                                 General Comments: pt pleasant, wanting to work hard and quiet        Exercises      Shoulder Instructions       General Comments Pt continues to make progess toward all goals.    Pertinent Vitals/ Pain       Pain Assessment Pain Assessment: Faces Faces Pain Scale: Hurts little more Pain Location: periarea rectum Pain Descriptors / Indicators: Grimacing, Tender Pain Intervention(s): Monitored during session, Repositioned  Home Living                                          Prior Functioning/Environment              Frequency  Min 2X/week        Progress Toward Goals  OT Goals(current goals can now be found in the care plan section)  Progress towards OT goals: Progressing toward goals  Acute Rehab OT Goals Patient Stated Goal: to go home OT Goal Formulation: With patient Time For Goal Achievement: 03/10/22 Potential to Achieve Goals: Good ADL Goals Pt Will Perform Grooming: standing;with modified independence Pt Will Perform Upper Body Bathing: with supervision;sitting Pt Will Perform Lower Body Bathing: with adaptive equipment;sitting/lateral leans;with supervision Pt Will Perform Lower Body Dressing: with adaptive equipment;with caregiver independent in assisting;sitting/lateral leans;sit to/from stand;with set-up;with supervision Pt Will Transfer to Toilet: with  supervision;ambulating;bedside commode Pt Will Perform Toileting - Clothing Manipulation and hygiene: with supervision;with adaptive equipment;sitting/lateral leans;sit to/from stand Pt/caregiver will Perform Home Exercise Program: Increased strength;Both right and left upper extremity;With Supervision Additional ADL Goal #1: Patient will identify at least 3 energy conservation strategies to employ at home in order to maximize function and quality of life and decrease caregiver burden while preventing exacerbation of symptoms and rehospitalization.  Plan Discharge plan remains appropriate    Co-evaluation                 AM-PAC OT "6 Clicks" Daily Activity     Outcome Measure   Help from another person eating meals?: None Help from another person taking care of personal grooming?: None Help from another person toileting, which includes using toliet, bedpan, or urinal?: A Little Help from another person bathing (including washing, rinsing, drying)?: A Little Help from another person to put on and taking off regular upper body clothing?: None Help from another person to put on and taking off regular lower body clothing?: A Little 6 Click Score: 21    End of Session Equipment Utilized During Treatment: Rolling walker (2 wheels)  OT Visit Diagnosis: Unsteadiness on feet (R26.81);History of falling (Z91.81);Muscle weakness (generalized) (M62.81)   Activity Tolerance Patient tolerated  treatment well   Patient Left in bed;with call bell/phone within reach;with bed alarm set   Nurse Communication Other (comment) (discussed discontinuing use of purwick)        Time: 4360-6770 OT Time Calculation (min): 16 min  Charges: OT General Charges $OT Visit: 1 Visit OT Treatments $Self Care/Home Management : 8-22 mins    Glenford Peers 03/05/2022, 10:13 AM

## 2022-03-06 ENCOUNTER — Inpatient Hospital Stay (HOSPITAL_COMMUNITY): Payer: Medicare Other

## 2022-03-06 ENCOUNTER — Other Ambulatory Visit (HOSPITAL_COMMUNITY): Payer: Self-pay

## 2022-03-06 DIAGNOSIS — R7881 Bacteremia: Secondary | ICD-10-CM

## 2022-03-06 LAB — ECHOCARDIOGRAM COMPLETE
Area-P 1/2: 5.62 cm2
Calc EF: 60.1 %
Height: 67 in
S' Lateral: 2.3 cm
Single Plane A2C EF: 61.7 %
Single Plane A4C EF: 61.1 %
Weight: 2342.17 oz

## 2022-03-06 LAB — RENAL FUNCTION PANEL
Albumin: 2.6 g/dL — ABNORMAL LOW (ref 3.5–5.0)
Anion gap: 6 (ref 5–15)
BUN: 36 mg/dL — ABNORMAL HIGH (ref 8–23)
CO2: 19 mmol/L — ABNORMAL LOW (ref 22–32)
Calcium: 8.1 mg/dL — ABNORMAL LOW (ref 8.9–10.3)
Chloride: 117 mmol/L — ABNORMAL HIGH (ref 98–111)
Creatinine, Ser: 2.32 mg/dL — ABNORMAL HIGH (ref 0.61–1.24)
GFR, Estimated: 28 mL/min — ABNORMAL LOW (ref >60)
Glucose, Bld: 117 mg/dL — ABNORMAL HIGH (ref 70–99)
Phosphorus: 2.9 mg/dL (ref 2.5–4.6)
Potassium: 4.4 mmol/L (ref 3.5–5.1)
Sodium: 142 mmol/L (ref 135–145)

## 2022-03-06 LAB — BASIC METABOLIC PANEL
Anion gap: 7 (ref 5–15)
BUN: 36 mg/dL — ABNORMAL HIGH (ref 8–23)
CO2: 19 mmol/L — ABNORMAL LOW (ref 22–32)
Calcium: 8.1 mg/dL — ABNORMAL LOW (ref 8.9–10.3)
Chloride: 116 mmol/L — ABNORMAL HIGH (ref 98–111)
Creatinine, Ser: 2.29 mg/dL — ABNORMAL HIGH (ref 0.61–1.24)
GFR, Estimated: 28 mL/min — ABNORMAL LOW (ref >60)
Glucose, Bld: 121 mg/dL — ABNORMAL HIGH (ref 70–99)
Potassium: 4.4 mmol/L (ref 3.5–5.1)
Sodium: 142 mmol/L (ref 135–145)

## 2022-03-06 LAB — CBC
HCT: 26.9 % — ABNORMAL LOW (ref 39.0–52.0)
Hemoglobin: 8.4 g/dL — ABNORMAL LOW (ref 13.0–17.0)
MCH: 28.3 pg (ref 26.0–34.0)
MCHC: 31.2 g/dL (ref 30.0–36.0)
MCV: 90.6 fL (ref 80.0–100.0)
Platelets: 69 10*3/uL — ABNORMAL LOW (ref 150–400)
RBC: 2.97 MIL/uL — ABNORMAL LOW (ref 4.22–5.81)
RDW: 16.5 % — ABNORMAL HIGH (ref 11.5–15.5)
WBC: 1.7 10*3/uL — ABNORMAL LOW (ref 4.0–10.5)
nRBC: 0 % (ref 0.0–0.2)

## 2022-03-06 LAB — GLUCOSE, CAPILLARY
Glucose-Capillary: 117 mg/dL — ABNORMAL HIGH (ref 70–99)
Glucose-Capillary: 124 mg/dL — ABNORMAL HIGH (ref 70–99)
Glucose-Capillary: 150 mg/dL — ABNORMAL HIGH (ref 70–99)
Glucose-Capillary: 179 mg/dL — ABNORMAL HIGH (ref 70–99)
Glucose-Capillary: 245 mg/dL — ABNORMAL HIGH (ref 70–99)
Glucose-Capillary: 247 mg/dL — ABNORMAL HIGH (ref 70–99)

## 2022-03-06 LAB — MAGNESIUM: Magnesium: 1.8 mg/dL (ref 1.7–2.4)

## 2022-03-06 LAB — PHOSPHORUS: Phosphorus: 2.9 mg/dL (ref 2.5–4.6)

## 2022-03-06 NOTE — Plan of Care (Signed)
  Problem: Education: Goal: Knowledge of General Education information will improve Description: Including pain rating scale, medication(s)/side effects and non-pharmacologic comfort measures Outcome: Progressing   Problem: Pain Managment: Goal: General experience of comfort will improve Outcome: Progressing   Problem: Elimination: Goal: Will not experience complications related to bowel motility Outcome: Progressing   Problem: Skin Integrity: Goal: Risk for impaired skin integrity will decrease Outcome: Progressing

## 2022-03-06 NOTE — Progress Notes (Signed)
PT Cancellation Note  Patient Details Name: [name] MRN: [id number] DOB: [birth year]   Cancelled Treatment:    Reason Eval/Treat Not Completed: Patient at procedure or test/unavailable (Pt is having an echo done at present. Will follow.)   Philomena Doheny PT 03/06/2022  Acute Rehabilitation Services Pager [phone] Office [phone]

## 2022-03-06 NOTE — TOC Progression Note (Signed)
Transition of Care [facility]) - Progression Note    Patient Details  Name: [name] MRN: [id number] Date of Birth: 04-25-1941  Transition of Care [facility]) CM/SW Contact  Leeroy Cha, RN Phone Number: 03/06/2022, 8:14 AM  Clinical Narrative:    Following for toc needs and possible hhc.   Expected Discharge Plan: Home/Self Care Barriers to Discharge: Continued Medical Work up  Expected Discharge Plan and Services Expected Discharge Plan: Home/Self Care   Discharge Planning Services: CM Consult   Living arrangements for the past 2 months: Single Family Home                                       Social Determinants of Health (SDOH) Interventions    Readmission Risk Interventions     No data to display

## 2022-03-06 NOTE — Progress Notes (Signed)
PROGRESS NOTE  [name] OAC:[id number] DOB: 03-Feb-1941 DOA: 02/19/2022 PCP: [name], MD   LOS: 14 days   Brief Narrative / Interim history: 81 year old male with history of CLL initially diagnosed in 2014, status postchemotherapy and currently on Imbruvica, DM 2, HTN, colorectal cancer 2009, who came into the hospital with progressive and generalized weakness.  Patient has been experiencing some weight loss and poor appetite for the past several months, was seen by GI in February as an outpatient and underwent work-up without any significant major findings.  He was taken off Imbruvica in April as it was believed that it may be related to that, however he continued to decline.  He was hospitalized just 2 weeks ago for dehydration, and when he left the hospital he was placed back on his Imbruvica.  While at home he continued to decline and was rehospitalized on 5/25.  Repeat CT scan of the abdomen and pelvis showed recurrence of the CLL, he was also found to be neutropenic, have a fever and have Enterococcus bacteremia.  Oncology and ID consulted. Palliative care has also been consulted for goals of care discussion. Nephrology was consulted on 02/27/2022 for worsening creatinine.  Subjective / 24h Interval events: Currently without complaints.  Does not have much of an appetite but has been able to eat and drink well without nausea or vomiting.  Assesement and Plan: Principal Problem:   Bacteremia due to Enterococcus Active Problems:   Neutropenia in patient with hx of CLL   Chronic lymphocytic leukemia (HCC)   AKI (acute kidney injury) ([place])   Dehydration   Colon cancer (HCC)   Protein-calorie malnutrition, severe ([place])   Hypertension   Hypercholesterolemia   Diabetes mellitus ([place])   Sepsis ([place])   Hyponatremia   Stage 3b chronic kidney disease (CKD) (HCC)   Neutropenic fever (HCC)   Refeeding syndrome   Hypophosphatemia   CLL (chronic lymphocytic leukemia) (HCC)   Failure  to thrive in adult   Palliative care by specialist   General weakness   Principal problem Neutropenic fever, sepsis due to Enterococcus bacteremia-patient was febrile in the first 24 after admission, blood cultures speciated Enterococcus.  ID consulted. 2D echo showed normal EF of 60 to 65%, small pericardial effusion,  there was also a nodular calcification in the right coronary cusp likely sclerosis but if high suspicion for SBE, consider TEE.  ID following and recommending TEE, however cardiology felt like the patient is high risk for TEE and this was canceled.  ID recommends ampicillin for 2 weeks from 5/27, until 6/10, and if he is discharged earlier to transition to Zyvox, and repeat TEE prior to antibiotics being stop.  Since we are at the 2-week mark tomorrow, repeat 2D echo today.  If negative for new findings, could be potentially discharged tomorrow once antibiotics are done   Active problems Goals of care, Failure to thrive, Dehydration/dysphagia/chronic cough, CLL, Physical deconditioning -overall difficult situation and complex case given progression of his CLL with progressive lymphadenopathy and now complicated by persistent neutropenia and Enterococcus bacteremia with concern for infectious endocarditis. Palliative care following. Dr Alen Blew saw him again on 6/7, poor prognosis due to progressive disease and poor performance status and multiorgan dysfunction.  Recommends to treat the treatable and consideration of hospice if he does not get better.  Palliative consulted as well, patient and wife want to give a trial of going home, see if he can get stronger before hospice   Acute kidney injury on CKD stage IIIb -  Baseline creatinine 1.4-1.6.  Creatinine continues to improve off IV fluids with just p.o. intake  Diarrhea -Possibly from tube feeds and antibiotic use.  Imodium as needed.  Patient had a rectal tube for the last few days which was dislodged on 03/02/22.   Pancytopenia -Due to  malignancy.  Patient received a few doses of Granix as per oncology recommendations for neutropenic fever.  Counts overall have remained stable but drifting down in the setting of CLL   Hyperlipidemia -Continue statin  Essential hypertension  -continue metoprolol   Hyperlipidemia -Continue statin    Diabetes mellitus type 2 with hyperglycemia -monitor CBGs  CBG (last 3)  Recent Labs    03/06/22 0006 03/06/22 0408 03/06/22 0725  GLUCAP 150* 124* 117*     Severe protein calorie malnutrition/hypoalbuminemia -Dietitian following.  Plan as above   Scheduled Meds:  atorvastatin  40 mg Oral Daily   chlorhexidine  15 mL Mouth Rinse BID   feeding supplement  1 Container Oral BID BM   feeding supplement  237 mL Oral BID BM   fiber  1 packet Oral TID   guaiFENesin-dextromethorphan  5 mL Oral Q8H   heparin injection (subcutaneous)  5,000 Units Subcutaneous Q8H   insulin aspart  0-9 Units Subcutaneous Q4H   liver oil-zinc oxide   Topical BID   metoprolol tartrate  12.5 mg Oral BID   mirtazapine  7.5 mg Oral QHS   multivitamin with minerals  1 tablet Oral Daily   ondansetron  4 mg Oral Q8H   Or   ondansetron (ZOFRAN) IV  4 mg Intravenous Q8H   pantoprazole  40 mg Oral BID   zinc sulfate  220 mg Oral Daily   Continuous Infusions:  sodium chloride 10 mL/hr at 03/04/22 1125   ampicillin (OMNIPEN) IV 2 g (03/06/22 0840)   PRN Meds:.sodium chloride, acetaminophen, labetalol, loperamide, metoprolol tartrate, oxyCODONE, prochlorperazine  Diet Orders (From admission, onward)     Start     Ordered   03/02/22 1253  DIET DYS 3 Room service appropriate? Yes; Fluid consistency: Thin  Diet effective now       Question Answer Comment  Room service appropriate? Yes   Fluid consistency: Thin      03/02/22 1252            DVT prophylaxis: heparin injection 5,000 Units Start: 03/03/22 1400   Lab Results  Component Value Date   PLT 69 (L) 03/06/2022      Code Status:  DNR  Family Communication: Wife present at bedside  Status is: Inpatient  Remains inpatient appropriate because: Possibly home tomorrow   Level of care: Progressive  Consultants:  ID Palliative Oncology   Objective: Vitals:   03/05/22 0600 03/05/22 1151 03/05/22 2140 03/06/22 0500  BP: 137/77 (!) 151/77 (!) 153/87   Pulse: 93 86 (!) 107   Resp: '20 16 17   '$ Temp: 98.3 F (36.8 C) 97.8 F (36.6 C) 97.7 F (36.5 C)   TempSrc: Oral Oral Oral   SpO2: 100% 100% 100%   Weight:    66.4 kg  Height:        Intake/Output Summary (Last 24 hours) at 03/06/2022 0908 Last data filed at 03/06/2022 0805 Gross per 24 hour  Intake 470 ml  Output 1600 ml  Net -1130 ml    Wt Readings from Last 3 Encounters:  03/06/22 66.4 kg  02/09/22 52.8 kg  01/12/22 54.9 kg    Examination:  Constitutional: NAD Eyes: lids and conjunctivae normal,  no scleral icterus ENMT: mmm Neck: normal, supple Respiratory: clear to auscultation bilaterally, no wheezing, no crackles. Normal respiratory effort.  Cardiovascular: Regular rate and rhythm, no murmurs / rubs / gallops. Trace edema Abdomen: soft, no distention, no tenderness. Bowel sounds positive.  Skin: no rashes Neurologic: no focal deficits, equal strength  Data Reviewed: I have independently reviewed following labs and imaging studies  CBC Recent Labs  Lab 03/01/22 0358 03/01/22 1811 03/02/22 0357 03/03/22 0420 03/04/22 0344 03/05/22 0400 03/06/22 0408  WBC 1.8*  --  2.2* 2.0* 2.2* 2.3* 1.7*  HGB 6.9*   < > 8.7* 8.0* 8.6* 7.9* 8.4*  HCT 22.4*   < > 27.3* 26.0* 27.1* 24.6* 26.9*  PLT 90*  --  106* 104* 88* 83* 69*  MCV 85.8  --  87.2 88.7 87.1 86.9 90.6  MCH 26.4  --  27.8 27.3 27.7 27.9 28.3  MCHC 30.8  --  31.9 30.8 31.7 32.1 31.2  RDW 18.3*  --  17.4* 17.1* 16.8* 16.2* 16.5*  LYMPHSABS 0.7  --  0.7 0.6* 0.9 1.0  --   MONOABS 0.1  --  0.1 0.1 0.1 0.2  --   EOSABS 0.0  --  0.0 0.0 0.1 0.1  --   BASOSABS 0.0  --  0.0 0.0 0.0 0.0   --    < > = values in this interval not displayed.     Recent Labs  Lab 03/01/22 0358 03/02/22 0357 03/03/22 0420 03/04/22 0344 03/05/22 0400 03/06/22 0408  NA 142 147* 147* 141  141 142  142 142  142  K 4.7 4.9 4.3 3.8  3.8 4.2  4.2 4.4  4.4  CL 117* 122* 119* 116*  115* 116*  116* 117*  116*  CO2 21* 20* 20* 20*  20* 19*  20* 19*  19*  GLUCOSE 105* 82 144* 169*  165* 130*  130* 117*  121*  BUN 86* 79* 65* 51*  49* 41*  41* 36*  36*  CREATININE 3.45* 3.26* 2.90* 2.55*  2.47* 2.38*  2.42* 2.32*  2.29*  CALCIUM 7.2* 8.1* 8.1* 8.1*  8.1* 7.9*  7.9* 8.1*  8.1*  AST 22  --   --   --  24  --   ALT 22  --   --   --  30  --   ALKPHOS 95  --   --   --  90  --   BILITOT 0.7  --   --   --  0.6  --   ALBUMIN 2.3*  --  2.9* 2.6* 2.4*  2.5* 2.6*  MG 2.1 2.2 2.0 1.8 1.8 1.8     ------------------------------------------------------------------------------------------------------------------ No results for input(s): "CHOL", "HDL", "LDLCALC", "TRIG", "CHOLHDL", "LDLDIRECT" in the last 72 hours.  Lab Results  Component Value Date   HGBA1C 6.9 (H) 02/09/2022   ------------------------------------------------------------------------------------------------------------------ No results for input(s): "TSH", "T4TOTAL", "T3FREE", "THYROIDAB" in the last 72 hours.  Invalid input(s): "FREET3"  Cardiac Enzymes No results for input(s): "CKMB", "TROPONINI", "MYOGLOBIN" in the last 168 hours.  Invalid input(s): "CK" ------------------------------------------------------------------------------------------------------------------ No results found for: "BNP"  CBG: Recent Labs  Lab 03/05/22 1640 03/05/22 1950 03/06/22 0006 03/06/22 0408 03/06/22 0725  GLUCAP 230* 166* 150* 124* 117*     No results found for this or any previous visit (from the past 240 hour(s)).   Radiology Studies: No results found.   Marzetta Board, MD, PhD [facility]  Between  7 am - 7 pm I am available,  please contact me via Amion (for emergencies) or Securechat (non urgent messages)  Between 7 pm - 7 am I am not available, please contact night coverage MD/APP via Amion

## 2022-03-06 NOTE — Progress Notes (Signed)
  Echocardiogram 2D Echocardiogram has been performed.  [name] 03/06/2022, 2:42 PM

## 2022-03-06 NOTE — Progress Notes (Signed)
Physical Therapy Treatment Patient Details Name: [name] MRN: [id number] DOB: 1940/12/30 Today's Date: 03/06/2022   History of Present Illness 81 year old male with history of CLL initially diagnosed in 2014, status postchemotherapy and currently on Imbruvica, DM 2, HTN, colorectal cancer 2009, who came into the hospital with progressive and generalized weakness and admitted 02/19/22 with Bacteremia due to Enterococcus    PT Comments    Pt ambulated 120' holding IV pole and handrail in hall, no loss of balance. He was able to stand unsupported for 2-3 minutes for pericare after a large, loose BM.    Recommendations for follow up therapy are one component of a multi-disciplinary discharge planning process, led by the attending physician.  Recommendations may be updated based on patient status, additional functional criteria and insurance authorization.  Follow Up Recommendations  Home health PT     Assistance Recommended at Discharge Intermittent Supervision/Assistance  Patient can return home with the following A little help with walking and/or transfers;A little help with bathing/dressing/bathroom;Assistance with cooking/housework;Assist for transportation   Equipment Recommendations       Recommendations for Other Services       Precautions / Restrictions Precautions Precautions: Fall Restrictions Weight Bearing Restrictions: No     Mobility  Bed Mobility Overal bed mobility: Modified Independent             General bed mobility comments: used bedrail, HOB up    Transfers Overall transfer level: Needs assistance Equipment used: Rolling walker (2 wheels) Transfers: Sit to/from Stand Sit to Stand: Supervision                Ambulation/Gait Ambulation/Gait assistance: Min guard Gait Distance (Feet): 120 Feet Assistive device: IV Pole Gait Pattern/deviations: Step-through pattern, Decreased stride length       General Gait Details: reached for  handrail frequently, no loss of balance   Stairs             Wheelchair Mobility    Modified Rankin (Stroke Patients Only)       Balance Overall balance assessment: Needs assistance Sitting-balance support: No upper extremity supported, Feet supported Sitting balance-Leahy Scale: Good     Standing balance support: During functional activity, Reliant on assistive device for balance, No upper extremity supported Standing balance-Leahy Scale: Fair Standing balance comment: Able to stand and wipe bottom for several minutes after large BM (pt was soiled in bed)                            Cognition Arousal/Alertness: Awake/alert Behavior During Therapy: WFL for tasks assessed/performed Overall Cognitive Status: Within Functional Limits for tasks assessed                                 General Comments: pt pleasant        Exercises      General Comments        Pertinent Vitals/Pain Pain Assessment Faces Pain Scale: Hurts little more Pain Location: periarea rectum when sitting, no pain at rest in bed Pain Descriptors / Indicators: Grimacing, Tender Pain Intervention(s): Limited activity within patient's tolerance, Monitored during session    Home Living                          Prior Function            PT Goals (current goals  can now be found in the care plan section) Acute Rehab PT Goals PT Goal Formulation: With patient Time For Goal Achievement: 03/10/22 Potential to Achieve Goals: Good Progress towards PT goals: Progressing toward goals    Frequency    Min 3X/week      PT Plan Current plan remains appropriate    Co-evaluation              AM-PAC PT "6 Clicks" Mobility   Outcome Measure  Help needed turning from your back to your side while in a flat bed without using bedrails?: A Little Help needed moving from lying on your back to sitting on the side of a flat bed without using bedrails?: A  Little Help needed moving to and from a bed to a chair (including a wheelchair)?: A Little Help needed standing up from a chair using your arms (e.g., wheelchair or bedside chair)?: A Little Help needed to walk in hospital room?: A Little Help needed climbing 3-5 steps with a railing? : A Little 6 Click Score: 18    End of Session Equipment Utilized During Treatment: Gait belt Activity Tolerance: Patient tolerated treatment well Patient left: in bed;with call bell/phone within reach;with bed alarm set;with family/visitor present Nurse Communication: Mobility status PT Visit Diagnosis: Difficulty in walking, not elsewhere classified (R26.2);Muscle weakness (generalized) (M62.81)     Time: 1452-1510 PT Time Calculation (min) (ACUTE ONLY): 18 min  Charges:  $Gait Training: 8-22 mins                    Blondell Reveal Kistler PT 03/06/2022  Acute Rehabilitation Services Pager [phone] Office [phone]

## 2022-03-06 NOTE — Care Management Important Message (Signed)
Important Message  Patient Details IM Letter placed in Patients room. Name: [name] MRN: [id number] Date of Birth: Aug 15, 1941   Medicare Important Message Given:  Yes     Kerin Salen 03/06/2022, 11:34 AM

## 2022-03-07 DIAGNOSIS — B952 Enterococcus as the cause of diseases classified elsewhere: Secondary | ICD-10-CM | POA: Diagnosis not present

## 2022-03-07 DIAGNOSIS — R7881 Bacteremia: Secondary | ICD-10-CM | POA: Diagnosis not present

## 2022-03-07 LAB — GLUCOSE, CAPILLARY
Glucose-Capillary: 147 mg/dL — ABNORMAL HIGH (ref 70–99)
Glucose-Capillary: 127 mg/dL — ABNORMAL HIGH (ref 70–99)
Glucose-Capillary: 98 mg/dL (ref 70–99)
Glucose-Capillary: 140 mg/dL — ABNORMAL HIGH (ref 70–99)
Glucose-Capillary: 201 mg/dL — ABNORMAL HIGH (ref 70–99)
Glucose-Capillary: 236 mg/dL — ABNORMAL HIGH (ref 70–99)

## 2022-03-07 LAB — COMPREHENSIVE METABOLIC PANEL
ALT: 26 U/L (ref 0–44)
AST: 16 U/L (ref 15–41)
Albumin: 2.5 g/dL — ABNORMAL LOW (ref 3.5–5.0)
Alkaline Phosphatase: 101 U/L (ref 38–126)
Anion gap: <3 — ABNORMAL LOW (ref 5–15)
BUN: 30 mg/dL — ABNORMAL HIGH (ref 8–23)
CO2: 22 mmol/L (ref 22–32)
Calcium: 7.9 mg/dL — ABNORMAL LOW (ref 8.9–10.3)
Chloride: 118 mmol/L — ABNORMAL HIGH (ref 98–111)
Creatinine, Ser: 2.26 mg/dL — ABNORMAL HIGH (ref 0.61–1.24)
GFR, Estimated: 29 mL/min — ABNORMAL LOW (ref >60)
Glucose, Bld: 128 mg/dL — ABNORMAL HIGH (ref 70–99)
Potassium: 4.1 mmol/L (ref 3.5–5.1)
Sodium: 141 mmol/L (ref 135–145)
Total Bilirubin: 0.7 mg/dL (ref 0.3–1.2)
Total Protein: 4.5 g/dL — ABNORMAL LOW (ref 6.5–8.1)

## 2022-03-07 LAB — CBC WITH DIFFERENTIAL/PLATELET
Abs Immature Granulocytes: 0 10*3/uL (ref 0.00–0.07)
Basophils Absolute: 0 10*3/uL (ref 0.0–0.1)
Basophils Relative: 2 %
Eosinophils Absolute: 0 10*3/uL (ref 0.0–0.5)
Eosinophils Relative: 0 %
HCT: 27.4 % — ABNORMAL LOW (ref 39.0–52.0)
Hemoglobin: 8.6 g/dL — ABNORMAL LOW (ref 13.0–17.0)
Lymphocytes Relative: 52 %
Lymphs Abs: 1 10*3/uL (ref 0.7–4.0)
MCH: 27.7 pg (ref 26.0–34.0)
MCHC: 31.4 g/dL (ref 30.0–36.0)
MCV: 88.1 fL (ref 80.0–100.0)
Monocytes Absolute: 0.1 10*3/uL (ref 0.1–1.0)
Monocytes Relative: 4 %
Myelocytes: 2 %
Neutro Abs: 0.8 10*3/uL — ABNORMAL LOW (ref 1.7–7.7)
Neutrophils Relative %: 40 %
Platelets: 64 10*3/uL — ABNORMAL LOW (ref 150–400)
RBC: 3.11 MIL/uL — ABNORMAL LOW (ref 4.22–5.81)
RDW: 16.7 % — ABNORMAL HIGH (ref 11.5–15.5)
WBC: 1.9 10*3/uL — ABNORMAL LOW (ref 4.0–10.5)
nRBC: 0 % (ref 0.0–0.2)

## 2022-03-07 LAB — CBC
HCT: 28.3 % — ABNORMAL LOW (ref 39.0–52.0)
Hemoglobin: 8.9 g/dL — ABNORMAL LOW (ref 13.0–17.0)
MCH: 27.8 pg (ref 26.0–34.0)
MCHC: 31.4 g/dL (ref 30.0–36.0)
MCV: 88.4 fL (ref 80.0–100.0)
Platelets: 65 10*3/uL — ABNORMAL LOW (ref 150–400)
RBC: 3.2 MIL/uL — ABNORMAL LOW (ref 4.22–5.81)
RDW: 16.7 % — ABNORMAL HIGH (ref 11.5–15.5)
WBC: 1.6 10*3/uL — ABNORMAL LOW (ref 4.0–10.5)
nRBC: 0 % (ref 0.0–0.2)

## 2022-03-07 LAB — MAGNESIUM: Magnesium: 1.6 mg/dL — ABNORMAL LOW (ref 1.7–2.4)

## 2022-03-07 LAB — PHOSPHORUS: Phosphorus: 2.9 mg/dL (ref 2.5–4.6)

## 2022-03-07 MED ORDER — ZINC SULFATE 220 (50 ZN) MG PO CAPS: 220.0000 mg | ORAL_CAPSULE | Freq: Every day | ORAL | Status: DC

## 2022-03-07 MED ORDER — ADULT MULTIVITAMIN W/MINERALS CH: 1.0000 | ORAL_TABLET | Freq: Every day | ORAL | Status: DC

## 2022-03-07 MED ORDER — MAGNESIUM SULFATE 2 GM/50ML IV SOLN
2.0000 g | Freq: Once | INTRAVENOUS | Status: AC
  Administered 2022-03-07: 2 g via INTRAVENOUS
  Filled 2022-03-07: qty 50

## 2022-03-07 MED ORDER — HYDROMORPHONE HCL 1 MG/ML IJ SOLN
INTRAMUSCULAR | Status: AC
  Filled 2022-03-07: qty 0.5

## 2022-03-07 MED ORDER — ENSURE ENLIVE PO LIQD: 237.0000 mL | Freq: Two times a day (BID) | ORAL | 12 refills | Status: DC

## 2022-03-07 MED ORDER — LOPERAMIDE HCL 2 MG PO CAPS: 2.0000 mg | ORAL_CAPSULE | Freq: Four times a day (QID) | ORAL | 0 refills | Status: DC | PRN

## 2022-03-07 MED ORDER — ACETAMINOPHEN 325 MG PO TABS: 650.0000 mg | ORAL_TABLET | Freq: Four times a day (QID) | ORAL | Status: DC | PRN

## 2022-03-07 NOTE — Plan of Care (Signed)

## 2022-03-07 NOTE — Discharge Summary (Signed)
Physician Discharge Summary  [name] MBW:[id number] DOB: 11/01/1940 DOA: 02/19/2022  PCP: [name], MD  Admit date: 02/19/2022 Discharge date: 03/07/2022  Admitted From: Home Disposition: Home with home health services  Recommendations for Outpatient Follow-up:  Follow up with PCP in 1-2 weeks Please repeat CBC, BMP and magnesium level on follow-up visit.  Consider outpatient nephrology referral if kidney function continues to get worse.  Not a good candidate for dialysis. follow-up with your oncologist in 2 weeks Follow-up with infectious disease on 7/6 at 2:45 PM  [place]: Yes Equipment/Devices: None Discharge Condition: Fair CODE STATUS: DNR Diet recommendation: Regular diet  Brief/Interim Summary:  81 year old male with history of CLL initially diagnosed in 2014, status postchemotherapy and currently on Imbruvica, DM 2, HTN, colorectal cancer 2009, who came into the hospital with progressive and generalized weakness.  Patient has been experiencing some weight loss and poor appetite for the past several months, was seen by GI in February as an outpatient and underwent work-up without any significant major findings.  He was taken off Imbruvica in April as it was believed that it may be related to that, however he continued to decline.  He was hospitalized just 2 weeks ago for dehydration, and when he left the hospital he was placed back on his Imbruvica.  While at home he continued to decline and was rehospitalized on 5/25.  Repeat CT scan of the abdomen and pelvis showed recurrence of the CLL, he was also found to be neutropenic, have a fever and have Enterococcus bacteremia.  Oncology and ID consulted. Palliative care has also been consulted for goals of care discussion. Nephrology was consulted on 02/27/2022 for worsening creatinine.  Neutropenic fever sepsis due to Enterococcus bacteremia - blood cultures speciated Enterococcus.   -ID consulted. 2D echo showed normal EF  of 60 to 65%, small pericardial effusion,  there was also a nodular calcification in the right coronary cusp likely sclerosis but if high suspicion for SBE, consider TEE.  ID recommended TEE however cardiology felt like the patient is high risk for TEE and this was canceled.   -ID recommends ampicillin for 2 weeks from 5/27, until 6/10, and if he is discharged earlier to transition to Zyvox, and repeat TEE prior to antibiotics being stop.   -Repeat transthoracic echo on 6/9: Negative for any acute findings.  He finished amoxicillin on 6/10 for total of 2 weeks duration. -Discussed with on-call ID Dr. Vella Kohler to discharge from ID standpoint.  Follow-up outpatient on 7/6 with Dr. Gale Journey as a scheduled  Failure to thrive Dehydration/dysphagia/chronic cough CLL Physical deconditioning  -overall difficult situation and complex case given progression of his CLL with progressive lymphadenopathy and now complicated by persistent neutropenia and Enterococcus bacteremia with concern for infectious endocarditis.  -Dr Alen Blew saw him again on 6/7, poor prognosis due to progressive disease and poor performance status and multiorgan dysfunction. Recommends to treat the treatable and consideration of hospice if he does not get better.  - Palliative consulted as well, patient and wife want to give a trial of going home, see if he can get stronger before hospice. home health services arranged prior to the discharge   Acute kidney injury on CKD stage IIIb  -Baseline creatinine 1.7-2.5 -Nephrology consulted recommended.  Renal function improved with IV fluids.  IV fluids discontinued.  Nephrology recommended transition to comfort measures only if kidney function continues to get worse.  Nephrology signed off.  Diarrhea  -Possibly from tube feeds and antibiotic use.  Imodium  as needed.  Patient had a rectal tube for the last few days which was dislodged on 03/02/22. -Improved   Pancytopenia -Due to malignancy.   Patient received a few doses of Granix as per oncology recommendations for neutropenic fever.  Counts overall have remained stable but drifting down in the setting of CLL -Commend to follow-up with oncology outpatient   Hyperlipidemia -Continued statin  Essential hypertension  -continued metoprolol   Hyperlipidemia -Continued statin    Diabetes mellitus type 2: Well-controlled.  A1c 6.9%.  Discharge Diagnoses:  Neutropenic fever Sepsis due to Enterococcus bacteremia Failure to thrive Dehydration Dysphagia CLL Physical deconditioning AKI on CKD stage IIIb Diarrhea Pancytopenia Hypertension Hyperlipidemia Type 2 diabetes with hyperglycemia Severe protein calorie malnutrition/hypoalbuminemia   Discharge Instructions  Discharge Instructions     Increase activity slowly   Complete by: As directed    No wound care   Complete by: As directed       Allergies as of 03/07/2022       Reactions   Hydrocodone-acetaminophen Other (See Comments)   Loss of weight        Medication List     STOP taking these medications    aspirin 81 MG tablet   metFORMIN 1000 MG tablet Commonly known as: GLUCOPHAGE       TAKE these medications    acetaminophen 325 MG tablet Commonly known as: TYLENOL Take 2 tablets (650 mg total) by mouth every 6 (six) hours as needed for fever.   amLODipine 2.5 MG tablet Commonly known as: NORVASC Take 2.5 mg by mouth daily.   atorvastatin 40 MG tablet Commonly known as: LIPITOR Take 40 mg by mouth daily.   famotidine 40 MG tablet Commonly known as: PEPCID Take 40 mg by mouth at bedtime.   feeding supplement Liqd Take 237 mLs by mouth 2 (two) times daily between meals.   hydroxypropyl methylcellulose / hypromellose 2.5 % ophthalmic solution Commonly known as: ISOPTO TEARS / GONIOVISC Place 1 drop into both eyes in the morning.   Imbruvica 420 MG tablet Generic drug: ibrutinib TAKE 1 TABLET BY MOUTH DAILY   lisinopril 20 MG  tablet Commonly known as: ZESTRIL Take 20 mg by mouth every morning.   loperamide 2 MG capsule Commonly known as: IMODIUM Take 1 capsule (2 mg total) by mouth every 6 (six) hours as needed for diarrhea or loose stools.   metoprolol tartrate 25 MG tablet Commonly known as: LOPRESSOR Take 12.5 mg by mouth 2 (two) times daily.   multivitamin with minerals Tabs tablet Take 1 tablet by mouth daily.   ondansetron 8 MG disintegrating tablet Commonly known as: ZOFRAN-ODT TAKE 1 TABLET BY MOUTH EVERY 8 HOURS AS NEEDED FOR NAUSEA AND VOMITING What changed: See the new instructions.   pantoprazole 40 MG tablet Commonly known as: PROTONIX Take 1 tablet (40 mg total) by mouth 2 (two) times daily. What changed: when to take this   prochlorperazine 10 MG tablet Commonly known as: COMPAZINE TAKE 1 TABLET BY MOUTH EVERY 6 HOURS AS NEEDED FOR NAUSEA OR VOMITING.   zinc sulfate 220 (50 Zn) MG capsule Take 1 capsule (220 mg total) by mouth daily.   zolpidem 10 MG tablet Commonly known as: AMBIEN Take 5 mg by mouth at bedtime.        Follow-up Information     [name], MD Follow up in 1 week(s).   Specialty: Family Medicine Contact information: [place] [address] [phone]  Jabier Mutton, MD. Call on 04/02/2022.   Specialty: Infectious Diseases Contact information: [address] [place] [address] [phone]         Wyatt Portela, MD Follow up in 1 week(s).   Specialty: Oncology Contact information: [place] [address] [phone]                Allergies  Allergen Reactions   Hydrocodone-Acetaminophen Other (See Comments)    Loss of weight    Consultations: Nephrology Oncology Infectious disease   Procedures/Studies: ECHOCARDIOGRAM COMPLETE  Result Date: 03/06/2022    ECHOCARDIOGRAM REPORT   Patient Name:   [name] Date of Exam: 03/06/2022 Medical Rec  #:  [id number]       Height:       67.0 in Accession #:    [id number]      Weight:       146.4 lb Date of Birth:  09-13-1941        BSA:          1.771 m Patient Age:    50 years        BP:           153/87 mmHg Patient Gender: M               HR:           91 bpm. Exam Location:  Inpatient Procedure: 2D Echo, Cardiac Doppler and Color Doppler Indications:    Bacteremia  History:        Patient has prior history of Echocardiogram examinations, most                 recent 02/21/2022. Risk Factors:Hypertension and Diabetes.  Sonographer:    Bernadene Person RDCS Referring Phys: [place]  1. Left ventricular ejection fraction, by estimation, is 60 to 65%. The left ventricle has normal function. The left ventricle has no regional wall motion abnormalities. Left ventricular diastolic parameters are consistent with Grade I diastolic dysfunction (impaired relaxation).  2. Right ventricular systolic function is normal. The right ventricular size is normal. There is normal pulmonary artery systolic pressure. The estimated right ventricular systolic pressure is 73.2 mmHg.  3. The mitral valve is normal in structure. Trivial mitral valve regurgitation. No evidence of mitral stenosis.  4. The aortic valve is tricuspid. There is mild calcification of the aortic valve. Aortic valve regurgitation is not visualized. No aortic stenosis is present.  5. The inferior vena cava is normal in size with greater than 50% respiratory variability, suggesting right atrial pressure of 3 mmHg.  6. A small pericardial effusion is present. The pericardial effusion is circumferential. FINDINGS  Left Ventricle: Left ventricular ejection fraction, by estimation, is 60 to 65%. The left ventricle has normal function. The left ventricle has no regional wall motion abnormalities. The left ventricular internal cavity size was normal in size. There is  no left ventricular hypertrophy. Left ventricular diastolic parameters are consistent  with Grade I diastolic dysfunction (impaired relaxation). Right Ventricle: The right ventricular size is normal. No increase in right ventricular wall thickness. Right ventricular systolic function is normal. There is normal pulmonary artery systolic pressure. The tricuspid regurgitant velocity is 2.86 m/s, and  with an assumed right atrial pressure of 3 mmHg, the estimated right ventricular systolic pressure is 20.2 mmHg. Left Atrium: Left atrial size was normal in size. Right Atrium: Right atrial size was normal in size. Pericardium: A small pericardial  effusion is present. The pericardial effusion is circumferential. Mitral Valve: The mitral valve is normal in structure. Trivial mitral valve regurgitation. No evidence of mitral valve stenosis. Tricuspid Valve: The tricuspid valve is normal in structure. Tricuspid valve regurgitation is trivial. Aortic Valve: The aortic valve is tricuspid. There is mild calcification of the aortic valve. Aortic valve regurgitation is not visualized. No aortic stenosis is present. Pulmonic Valve: The pulmonic valve was normal in structure. Pulmonic valve regurgitation is not visualized. Aorta: The aortic root is normal in size and structure. Venous: The inferior vena cava is normal in size with greater than 50% respiratory variability, suggesting right atrial pressure of 3 mmHg. IAS/Shunts: No atrial level shunt detected by color flow Doppler.  LEFT VENTRICLE PLAX 2D LVIDd:         3.70 cm     Diastology LVIDs:         2.30 cm     LV e' medial:    5.26 cm/s LV PW:         1.00 cm     LV E/e' medial:  10.8 LV IVS:        1.00 cm     LV e' lateral:   6.31 cm/s LVOT diam:     2.10 cm     LV E/e' lateral: 9.0 LV SV:         66 LV SV Index:   37 LVOT Area:     3.46 cm  LV Volumes (MOD) LV vol d, MOD A2C: 61.6 ml LV vol d, MOD A4C: 65.1 ml LV vol s, MOD A2C: 23.6 ml LV vol s, MOD A4C: 25.3 ml LV SV MOD A2C:     38.0 ml LV SV MOD A4C:     65.1 ml LV SV MOD BP:      38.3 ml RIGHT  VENTRICLE RV S prime:     10.40 cm/s TAPSE (M-mode): 2.1 cm LEFT ATRIUM             Index        RIGHT ATRIUM           Index LA diam:        3.10 cm 1.75 cm/m   RA Area:     11.40 cm LA Vol (A2C):   26.3 ml 14.85 ml/m  RA Volume:   22.60 ml  12.76 ml/m LA Vol (A4C):   30.9 ml 17.45 ml/m LA Biplane Vol: 29.8 ml 16.83 ml/m  AORTIC VALVE LVOT Vmax:   90.00 cm/s LVOT Vmean:  58.300 cm/s LVOT VTI:    0.190 m  AORTA Ao Root diam: 3.40 cm Ao Asc diam:  3.10 cm MITRAL VALVE               TRICUSPID VALVE MV Area (PHT): 5.62 cm    TR Peak grad:   32.7 mmHg MV Decel Time: 135 msec    TR Vmax:        286.00 cm/s MV E velocity: 57.00 cm/s MV A velocity: 77.60 cm/s  SHUNTS MV E/A ratio:  0.73        Systemic VTI:  0.19 m                            Systemic Diam: 2.10 cm Dalton McleanMD Electronically signed by Franki Monte Signature Date/Time: 03/06/2022/5:36:10 PM    Final    DG Swallowing Func-Speech Pathology  Result Date: 03/02/2022 Table formatting from the  original result was not included. Objective Swallowing Evaluation: Type of Study: MBS-Modified Barium Swallow Study  Patient Details Name: [name] MRN: [id number] Date of Birth: Jan 10, 1941 Today's Date: 03/02/2022 Time: SLP Start Time (ACUTE ONLY): 1215 -SLP Stop Time (ACUTE ONLY): 2423 SLP Time Calculation (min) (ACUTE ONLY): 20 min Past Medical History: Past Medical History: Diagnosis Date  CLL (chronic lymphocytic leukemia) ([place] [facility])   chemotherapy  Colon cancer ([place])   Colorectal cancer ([place])   COLORECTAL DX 2/09, treated surgically  Diabetes mellitus   Hypercholesterolemia   Hypertension   Pneumonia   Bilateral pneumonia Past Surgical History: Past Surgical History: Procedure Laterality Date  HEMICOLECTOMY    nasal polyps    S/P nasal surgery HPI: 81yo male admitted 02/19/22 with generalized weakness, recurrent falls. PMH: CLL, DM2, HTN, colorectal cancer (2009), HLD. Recent hospitalization due to poor appetite/intake, FTT.  Pt had COVID and has had cough  since his COVID.  MBS on 5/26 completed with recommendation for NPO.Patient and family are declining PEG and repeat MBS planned to aid in decision making regarding PO's.  Subjective: pleasant, cooperative  Recommendations for follow up therapy are one component of a multi-disciplinary discharge planning process, led by the attending physician.  Recommendations may be updated based on patient status, additional functional criteria and insurance authorization. Assessment / Plan / Recommendation   03/02/2022   1:40 PM Clinical Impressions Clinical Impression Patient presents with some improvements in swallow function as per this repeat MBS as compared to MBS on 02/20/22. He continues to exhibit swallow initiation delays at level of vallecular sinus with all consistencies (puree, honey, nectar, regular, barium tablet, thin). With thin liquids, patient had initially trace vallecular residuals and moderate pyriform sinus residuals. Pyriform residuals cleared to minimal amount with 2-3 extra/dry swallows. Patient did exhibit very trace penetration during the swallow with thin liquids and trace to mild penetration from barium flowing out of full pyriform sinus but in all instances, penetrate remained above vocal cords and fully excited laryngeal vestibule. Penetration observed after teh swallow with nectar thick liquids (PAS 2) with no aspiration and full clearance of penetrate from laryngeal vestibule. No aspiration obseved prior to, during or after the swallow with any of the tested barium consistencies. Vallecular residuals were trace to mild in amount for thin liquid, nectar thick, honey thick, puree and regular solids; pyriform sinus residuals were moderate in amount for thin liquid and mild for nectar, honey, puree and regular solids. Barium tablet (1/2 of a 56m tablet) became lodged in vallecular sinus and despite multiple trials, was not able to be cleared. Overall, patient appears to be protecting his airway better  during the swallow and is able to more effectively clear pharyngeal residuals with extra/dry swallows. He will continue to be at risk for aspiration due to pyriform sinus residuals but aspiration risk can be reduced by using strategies of swallowing an extra 2-3 times for each bite/sip, fully masticating food before swallowing, and sitting upright during and after meals. SLP Visit Diagnosis Dysphagia, unspecified (R13.10) Impact on safety and function Moderate aspiration risk     03/02/2022   1:40 PM Treatment Recommendations Treatment Recommendations Therapy as outlined in treatment plan below     03/02/2022   1:52 PM Prognosis Prognosis for Safe Diet Advancement Good Barriers to Reach Goals Severity of deficits   03/02/2022   1:40 PM Diet Recommendations SLP Diet Recommendations Dysphagia 3 (Mech soft) solids;Thin liquid Liquid Administration via Cup;Straw Medication Administration Crushed with puree Compensations Slow rate;Small sips/bites Postural  Changes Seated upright at 90 degrees;Remain semi-upright after after feeds/meals (Comment)     03/02/2022   1:40 PM Other Recommendations Oral Care Recommendations Oral care BID Follow Up Recommendations Home health SLP Assistance recommended at discharge Frequent or constant Supervision/Assistance Functional Status Assessment Patient has had a recent decline in their functional status and demonstrates the ability to make significant improvements in function in a reasonable and predictable amount of time.   03/02/2022   1:40 PM Frequency and Duration  Speech Therapy Frequency (ACUTE ONLY) min 2x/week Treatment Duration 1 week     03/02/2022  12:58 PM Oral Phase Oral Phase [facility]    03/02/2022   1:38 PM Pharyngeal Phase Pharyngeal- Thin Cup Penetration/Aspiration during swallow;Penetration/Apiration after swallow Pharyngeal Material enters airway, remains ABOVE vocal cords then ejected out Pharyngeal- Thin Straw Penetration/Aspiration during swallow;Penetration/Apiration after swallow  Pharyngeal Material enters airway, remains ABOVE vocal cords then ejected out    03/02/2022   1:39 PM Cervical Esophageal Phase  Cervical Esophageal Phase Impaired Honey Cup Reduced cricopharyngeal relaxation Nectar Cup Reduced cricopharyngeal relaxation Thin Cup Reduced cricopharyngeal relaxation Thin Straw Reduced cricopharyngeal relaxation Puree Reduced cricopharyngeal relaxation Regular Reduced cricopharyngeal relaxation Pill Other (Comment) Sonia Baller, MA, CCC-SLP Speech Therapy                     DG Abd 1 View  Result Date: 02/28/2022 CLINICAL DATA:  Feeding tube placement. EXAM: ABDOMEN - 1 VIEW COMPARISON:  Prior today FINDINGS: Feeding tube remains unchanged in position, with tip in the region of the GE junction. No evidence of dilated bowel loops. IMPRESSION: Feeding tube tip remains near the GE junction. Electronically Signed   By: Marlaine Hind M.D.   On: 02/28/2022 15:07   DG Abd Portable 1V  Result Date: 02/28/2022 CLINICAL DATA:  Feeding tube placement. EXAM: PORTABLE ABDOMEN - 1 VIEW COMPARISON:  None Available. FINDINGS: A feeding tube is now seen with tip overlying the GE junction. No evidence of dilated bowel loops IMPRESSION: Feeding tube tip overlies the GE junction. Electronically Signed   By: Marlaine Hind M.D.   On: 02/28/2022 13:01   US RENAL  Result Date: 02/26/2022 CLINICAL DATA:  Renal failure.  History of colon cancer.  CLL. EXAM: RENAL / URINARY TRACT ULTRASOUND COMPLETE COMPARISON:  CT abdomen and pelvis without contrast 02/19/2022 FINDINGS: Right Kidney: Renal measurements: 11.1 by 4.0 x 4.5 cm = volume: 103 mL. Echogenicity within normal limits. There are benign anechoic avascular simple cysts measuring up to 2.4 cm within the right upper pole and 2.0 cm within the right midpole. No hydronephrosis. Left Kidney: Renal measurements: 11.3 x 5.6 x 4.6 cm = volume: 15.5 mL. Echogenicity within normal limits. Left midpole anechoic avascular simple cyst measuring 1.9 cm. No  hydronephrosis. Bladder: Appears normal for degree of bladder distention. Other: Mild free fluid is seen within the pelvis as on recent CT. IMPRESSION: 1. Bilateral benign simple renal cysts. 2. No hydronephrosis. 3. Mild free fluid within the pelvis as on recent CT. Electronically Signed   By: Yvonne Kendall M.D.   On: 02/26/2022 13:49   DG CHEST PORT 1 VIEW  Result Date: 02/23/2022 CLINICAL DATA:  Weakness. Colon cancer and chronic lymphocytic leukemia. EXAM: PORTABLE CHEST 1 VIEW COMPARISON:  02/19/2022 FINDINGS: The heart size and mediastinal contours are within normal limits. Aortic atherosclerotic calcification incidentally noted. A Dobbhoff feeding tube is seen in place with the tip in the proximal stomach. Low lung volumes are seen with bibasilar  atelectasis, new since prior exam. IMPRESSION: Low lung volumes with bibasilar atelectasis. Dobbhoff feeding tube tip in proximal stomach. Electronically Signed   By: Marlaine Hind M.D.   On: 02/23/2022 10:30   ECHOCARDIOGRAM COMPLETE  Result Date: 02/21/2022    ECHOCARDIOGRAM REPORT   Patient Name:   [name] Date of Exam: 02/21/2022 Medical Rec #:  [id number]       Height:       67.0 in Accession #:    [id number]      Weight:       121.7 lb Date of Birth:  08-Sep-1941        BSA:          1.637 m Patient Age:    67 years        BP:           154/63 mmHg Patient Gender: M               HR:           105 bpm. Exam Location:  Inpatient Procedure: 2D Echo, Cardiac Doppler, Color Doppler and Strain Analysis Indications:    Bacteremia  History:        Patient has prior history of Echocardiogram examinations, most                 recent 10/23/2020. Risk Factors:Dyslipidemia, Hypertension and                 Diabetes.  Sonographer:    Luisa Hart RDCS Referring Phys: [place]  1. Strain abnormal -11.5 but poor tracking not reported EF is hyperdynamic.  2. Left ventricular ejection fraction, by estimation, is 60 to 65%. The left  ventricle has normal function. The left ventricle has no regional wall motion abnormalities. Left ventricular diastolic parameters were normal.  3. Right ventricular systolic function is normal. The right ventricular size is normal.  4. The pericardial effusion is posterior to the left ventricle and anterior to the right ventricle.  5. The mitral valve is normal in structure. No evidence of mitral valve regurgitation. No evidence of mitral stenosis.  6. Nodular calcification of right coronary cusp likely sclerosis If suspicion of SBE high consider TEE. The aortic valve is normal in structure. There is moderate calcification of the aortic valve. There is moderate thickening of the aortic valve. Aortic valve regurgitation is not visualized. Aortic valve sclerosis/calcification is present, without any evidence of aortic stenosis.  7. The inferior vena cava is normal in size with greater than 50% respiratory variability, suggesting right atrial pressure of 3 mmHg. FINDINGS  Left Ventricle: Left ventricular ejection fraction, by estimation, is 60 to 65%. The left ventricle has normal function. The left ventricle has no regional wall motion abnormalities. Global longitudinal strain performed but not reported based on interpreter judgement due to suboptimal tracking. The left ventricular internal cavity size was normal in size. There is no asymmetric left ventricular hypertrophy of the basal and septal segments. Left ventricular diastolic parameters were normal. Right Ventricle: The right ventricular size is normal. No increase in right ventricular wall thickness. Right ventricular systolic function is normal. Left Atrium: Left atrial size was normal in size. Right Atrium: Right atrial size was normal in size. Pericardium: Trivial pericardial effusion is present. The pericardial effusion is posterior to the left ventricle and anterior to the right ventricle. Mitral Valve: The mitral valve is normal in structure. No  evidence of mitral valve regurgitation. No evidence of  mitral valve stenosis. MV peak gradient, 4.1 mmHg. The mean mitral valve gradient is 2.0 mmHg. Tricuspid Valve: The tricuspid valve is normal in structure. Tricuspid valve regurgitation is mild . No evidence of tricuspid stenosis. Aortic Valve: Nodular calcification of right coronary cusp likely sclerosis If suspicion of SBE high consider TEE. The aortic valve is normal in structure. There is moderate calcification of the aortic valve. There is moderate thickening of the aortic valve. Aortic valve regurgitation is not visualized. Aortic valve sclerosis/calcification is present, without any evidence of aortic stenosis. Aortic valve mean gradient measures 3.0 mmHg. Aortic valve peak gradient measures 6.2 mmHg. Aortic valve area, by VTI measures 2.20 cm. Pulmonic Valve: The pulmonic valve was normal in structure. Pulmonic valve regurgitation is not visualized. No evidence of pulmonic stenosis. Aorta: The aortic root is normal in size and structure. Venous: The inferior vena cava is normal in size with greater than 50% respiratory variability, suggesting right atrial pressure of 3 mmHg. IAS/Shunts: No atrial level shunt detected by color flow Doppler. Additional Comments: Strain abnormal -11.5 but poor tracking not reported EF is hyperdynamic.  LEFT VENTRICLE PLAX 2D LVIDd:         2.60 cm     Diastology LVIDs:         1.10 cm     LV e' medial:    5.33 cm/s LV PW:         1.60 cm     LV E/e' medial:  11.2 LV IVS:        1.90 cm     LV e' lateral:   7.07 cm/s LVOT diam:     2.00 cm     LV E/e' lateral: 8.4 LV SV:         41 LV SV Index:   25 LVOT Area:     3.14 cm  LV Volumes (MOD) LV vol d, MOD A2C: 39.6 ml LV vol d, MOD A4C: 42.6 ml LV vol s, MOD A2C: 9.8 ml LV vol s, MOD A4C: 7.4 ml LV SV MOD A2C:     29.8 ml LV SV MOD A4C:     42.6 ml LV SV MOD BP:      33.2 ml RIGHT VENTRICLE RV Basal diam:  3.10 cm RV Mid diam:    2.20 cm RV S prime:     21.60 cm/s TAPSE  (M-mode): 2.0 cm LEFT ATRIUM           Index        RIGHT ATRIUM          Index LA diam:      2.70 cm 1.65 cm/m   RA Area:     9.61 cm LA Vol (A4C): 27.6 ml 16.86 ml/m  RA Volume:   15.60 ml 9.53 ml/m  AORTIC VALVE                    PULMONIC VALVE AV Area (Vmax):    2.24 cm     PV Vmax:       0.98 m/s AV Area (Vmean):   2.09 cm     PV Vmean:      66.200 cm/s AV Area (VTI):     2.20 cm     PV VTI:        0.132 m AV Vmax:           124.00 cm/s  PV Peak grad:  3.8 mmHg AV Vmean:  86.300 cm/s  PV Mean grad:  2.0 mmHg AV VTI:            0.186 m AV Peak Grad:      6.2 mmHg AV Mean Grad:      3.0 mmHg LVOT Vmax:         88.60 cm/s LVOT Vmean:        57.400 cm/s LVOT VTI:          0.130 m LVOT/AV VTI ratio: 0.70  AORTA Ao Root diam: 3.10 cm Ao Asc diam:  2.70 cm MITRAL VALVE               TRICUSPID VALVE MV Area (PHT): 4.49 cm    TR Peak grad:   29.8 mmHg MV Area VTI:   2.35 cm    TR Vmax:        273.00 cm/s MV Peak grad:  4.1 mmHg MV Mean grad:  2.0 mmHg    SHUNTS MV Vmax:       1.01 m/s    Systemic VTI:  0.13 m MV Vmean:      65.5 cm/s   Systemic Diam: 2.00 cm MV Decel Time: 169 msec MV E velocity: 59.60 cm/s MV A velocity: 88.60 cm/s MV E/A ratio:  0.67 Jenkins Rouge MD Electronically signed by Jenkins Rouge MD Signature Date/Time: 02/21/2022/11:07:24 AM    Final    DG Abd Portable 1V  Result Date: 02/20/2022 CLINICAL DATA:  NG tube placement EXAM: PORTABLE ABDOMEN - 1 VIEW COMPARISON:  02/09/2022 FINDINGS: Esophageal tube tip overlies the proximal stomach. There is a small amount of enteral contrast in the stomach and colon. IMPRESSION: Esophageal tube tip overlies the gastric fundus Electronically Signed   By: Donavan Foil M.D.   On: 02/20/2022 15:58   DG Swallowing Func-Speech Pathology  Result Date: 02/20/2022 Table formatting from the original result was not included. Objective Swallowing Evaluation: Type of Study: MBS-Modified Barium Swallow Study  Patient Details Name: [name] MRN:  [id number] Date of Birth: Dec 19, 1940 Today's Date: 02/20/2022 Time: SLP Start Time (ACUTE ONLY): 1601 -SLP Stop Time (ACUTE ONLY): 0932 SLP Time Calculation (min) (ACUTE ONLY): 20 min Past Medical History: Past Medical History: Diagnosis Date  CLL (chronic lymphocytic leukemia) ([place])   chemotherapy  Colon cancer ([place])   Colorectal cancer ([place])   COLORECTAL DX 2/09, treated surgically  Diabetes mellitus   Hypercholesterolemia   Hypertension   Pneumonia   Bilateral pneumonia Past Surgical History: Past Surgical History: Procedure Laterality Date  HEMICOLECTOMY    nasal polyps    S/P nasal surgery HPI: 81yo male admitted 02/19/22 with generalized weakness, recurrent falls. PMH: CLL, DM2, HTN, colorectal cancer (2009), HLD. Recent hospitalization due to poor appetite/intake, FTT.  Subjective: Pt seen in radiology for MBS.  Recommendations for follow up therapy are one component of a multi-disciplinary discharge planning process, led by the attending physician.  Recommendations may be updated based on patient status, additional functional criteria and insurance authorization. Assessment / Plan / Recommendation   02/20/2022   3:18 PM Clinical Impressions Clinical Impression Pt presents with mild oral, moderate-severe  pharyngeal dysphagia, characterized as follows: Orally, pt exhibits slightly decreased bolus prep, and posterior spillage over tongue base. Pharyngeal swallow is characterized by trigger of the swallow reflex at the vallecular sinus on thin liquids and puree, and at the pyriform sinus on nectar thick liquids. Trace tongue base and vallecular sinus residue noted across consistencies. Pyriform residue present across consistencies, and increased with viscosity of  texture. Pt exhibited trace aspiration from pyriform sinus residue on all consistencies given with significantly delayed or absent cough response. Risk of aspiration increases further as residue thins with secretions or additional boluses. Recommend NPO at  this time, including medications. Medical team was informed of results and recommendations. Results were also discussed with pt and wife. SLP will follow at bedside to begin dysphagia treatment with the goal for return to PO status safely.  SLP Visit Diagnosis Dysphagia, oropharyngeal phase (R13.12) Impact on safety and function Severe aspiration risk;Risk for inadequate nutrition/hydration     02/20/2022   3:18 PM Treatment Recommendations Treatment Recommendations Therapy as outlined in treatment plan below     02/20/2022   3:28 PM Prognosis Prognosis for Safe Diet Advancement Fair Barriers to Reach Goals Severity of deficits   02/20/2022   3:18 PM Diet Recommendations SLP Diet Recommendations NPO Medication Administration Via alternative means     02/20/2022   3:18 PM Other Recommendations Oral Care Recommendations Oral care QID;Staff/trained caregiver to provide oral care Other Recommendations Have oral suction available Follow Up Recommendations Other (comment) Assistance recommended at discharge Frequent or constant Supervision/Assistance Functional Status Assessment Patient has had a recent decline in their functional status and demonstrates the ability to make significant improvements in function in a reasonable and predictable amount of time.   02/20/2022   3:18 PM Frequency and Duration  Speech Therapy Frequency (ACUTE ONLY) min 1 x/week Treatment Duration 1 week     02/20/2022   3:18 PM Oral Phase Oral Phase [facility]    02/20/2022   3:18 PM Pharyngeal Phase Pharyngeal Phase Impaired    02/20/2022   3:18 PM Cervical Esophageal Phase  Cervical Esophageal Phase Impaired Celia B. Quentin Ore [facility] [place] Speech Language Pathologist Office: [phone] Shonna Chock 02/20/2022, 3:30 PM                     CT CHEST WO CONTRAST  Result Date: 02/19/2022 CLINICAL DATA:  CLL. History of colon cancer. Metastatic disease evaluation. Cough, nausea/vomiting. EXAM: CT CHEST, ABDOMEN AND PELVIS WITHOUT CONTRAST TECHNIQUE:  Multidetector CT imaging of the chest, abdomen and pelvis was performed following the standard protocol without IV contrast. RADIATION DOSE REDUCTION: This exam was performed according to the departmental dose-optimization program which includes automated exposure control, adjustment of the mA and/or kV according to patient size and/or use of iterative reconstruction technique. COMPARISON:  CTA chest dated 10/22/2020. CT abdomen/pelvis dated 02/28/2020. FINDINGS: CT CHEST FINDINGS Cardiovascular: The heart is normal in size. Small pericardial effusion. No evidence of thoracic aortic aneurysm. Atherosclerotic calcifications of the aortic arch. Mild three-vessel coronary atherosclerosis. Mediastinum/Nodes: Mediastinal lymphadenopathy, poorly evaluated due to lack of interest contrast administration, noting a dominant 15 mm short axis subcarinal node (series 3/image 29). This previously measured 11 mm. Visualized thyroid is unremarkable. Lungs/Pleura: Small bilateral pleural effusions, right greater than left. Associated patchy bilateral lower lobe opacities, favoring atelectasis. No frank interstitial edema. Evaluation lung parenchyma is constrained by respiratory motion. Within that constraint, there are no suspicious pulmonary nodules. No pneumothorax. Musculoskeletal: No focal osseous lesions. CT ABDOMEN PELVIS FINDINGS Hepatobiliary: Unenhanced liver is notable for scattered hepatic cysts measuring up to 14 mm (series 3/image 1), benign. Gallbladder is unremarkable. No intrahepatic or extrahepatic ductal dilatation. Pancreas: Within normal limits. Spleen: Within normal limits. Adrenals/Urinary Tract: Adrenal glands are within normal limits. Bilateral renal cysts, measuring up to 2.2 cm in the right upper kidney (series 3/image 60). No hydronephrosis. Bladder is within normal limits.  Stomach/Bowel: Stomach is within normal limits. No evidence of bowel obstruction. Appendix is not discretely visualized. Status post  left hemicolectomy with suture line in the lower pelvis (series 3/image 105). Vascular/Lymphatic: No evidence of abdominal aortic aneurysm. Atherosclerotic calcifications of the abdominal aorta and branch vessels. Bulky upper abdominal lymphadenopathy, poorly evaluated on unenhanced CT but new from recent CTA, including a dominant 3.9 cm node in the porta hepatis (series 3/image 58). Additional 2.4 cm short axis node in the splenic hilum (series 3/image 9), new. Para-aortic lymphadenopathy, including a dominant 16 mm short axis node on the left (series 3/image 65), previously 12 mm. Reproductive: Prostate is notable for dystrophic calcifications. Other: Small volume abdominopelvic ascites. Musculoskeletal: Visualized osseous structures are within normal limits. IMPRESSION: Limited evaluation due to lack of intravenous contrast administration. Bulky upper abdominal lymphadenopathy, new. Progressive mediastinal and retroperitoneal lymphadenopathy. These are poorly evaluated but favor progressive lymphoma. Status post left hemicolectomy. No findings specific for recurrent or metastatic disease. Small bilateral pleural effusions, right greater than left. Small volume abdominopelvic ascites. Electronically Signed   By: Julian Hy M.D.   On: 02/19/2022 21:14   CT ABDOMEN PELVIS WO CONTRAST  Result Date: 02/19/2022 CLINICAL DATA:  CLL. History of colon cancer. Metastatic disease evaluation. Cough, nausea/vomiting. EXAM: CT CHEST, ABDOMEN AND PELVIS WITHOUT CONTRAST TECHNIQUE: Multidetector CT imaging of the chest, abdomen and pelvis was performed following the standard protocol without IV contrast. RADIATION DOSE REDUCTION: This exam was performed according to the departmental dose-optimization program which includes automated exposure control, adjustment of the mA and/or kV according to patient size and/or use of iterative reconstruction technique. COMPARISON:  CTA chest dated 10/22/2020. CT abdomen/pelvis  dated 02/28/2020. FINDINGS: CT CHEST FINDINGS Cardiovascular: The heart is normal in size. Small pericardial effusion. No evidence of thoracic aortic aneurysm. Atherosclerotic calcifications of the aortic arch. Mild three-vessel coronary atherosclerosis. Mediastinum/Nodes: Mediastinal lymphadenopathy, poorly evaluated due to lack of interest contrast administration, noting a dominant 15 mm short axis subcarinal node (series 3/image 29). This previously measured 11 mm. Visualized thyroid is unremarkable. Lungs/Pleura: Small bilateral pleural effusions, right greater than left. Associated patchy bilateral lower lobe opacities, favoring atelectasis. No frank interstitial edema. Evaluation lung parenchyma is constrained by respiratory motion. Within that constraint, there are no suspicious pulmonary nodules. No pneumothorax. Musculoskeletal: No focal osseous lesions. CT ABDOMEN PELVIS FINDINGS Hepatobiliary: Unenhanced liver is notable for scattered hepatic cysts measuring up to 14 mm (series 3/image 1), benign. Gallbladder is unremarkable. No intrahepatic or extrahepatic ductal dilatation. Pancreas: Within normal limits. Spleen: Within normal limits. Adrenals/Urinary Tract: Adrenal glands are within normal limits. Bilateral renal cysts, measuring up to 2.2 cm in the right upper kidney (series 3/image 60). No hydronephrosis. Bladder is within normal limits. Stomach/Bowel: Stomach is within normal limits. No evidence of bowel obstruction. Appendix is not discretely visualized. Status post left hemicolectomy with suture line in the lower pelvis (series 3/image 105). Vascular/Lymphatic: No evidence of abdominal aortic aneurysm. Atherosclerotic calcifications of the abdominal aorta and branch vessels. Bulky upper abdominal lymphadenopathy, poorly evaluated on unenhanced CT but new from recent CTA, including a dominant 3.9 cm node in the porta hepatis (series 3/image 58). Additional 2.4 cm short axis node in the splenic  hilum (series 3/image 9), new. Para-aortic lymphadenopathy, including a dominant 16 mm short axis node on the left (series 3/image 65), previously 12 mm. Reproductive: Prostate is notable for dystrophic calcifications. Other: Small volume abdominopelvic ascites. Musculoskeletal: Visualized osseous structures are within normal limits. IMPRESSION: Limited evaluation due to lack  of intravenous contrast administration. Bulky upper abdominal lymphadenopathy, new. Progressive mediastinal and retroperitoneal lymphadenopathy. These are poorly evaluated but favor progressive lymphoma. Status post left hemicolectomy. No findings specific for recurrent or metastatic disease. Small bilateral pleural effusions, right greater than left. Small volume abdominopelvic ascites. Electronically Signed   By: Julian Hy M.D.   On: 02/19/2022 21:14   DG CHEST PORT 1 VIEW  Result Date: 02/19/2022 CLINICAL DATA:  Weakness.  History of CLL. EXAM: PORTABLE CHEST 1 VIEW COMPARISON:  Chest radiographs 02/09/2022 FINDINGS: The cardiomediastinal silhouette is unchanged with normal heart size. Aortic atherosclerosis is noted. No airspace consolidation, edema, pleural effusion, or pneumothorax is identified. No acute osseous abnormality is seen. IMPRESSION: No active disease. Electronically Signed   By: Logan Bores M.D.   On: 02/19/2022 13:47   US RENAL  Result Date: 02/09/2022 CLINICAL DATA:  Acute kidney injury. EXAM: RENAL / URINARY TRACT ULTRASOUND COMPLETE COMPARISON:  None Available. FINDINGS: Right Kidney: Renal measurements: 11.11 x 4.5 x 4.0 cm = volume: 101 mL. Anechoic lesions with increased through transmission measure up to 2.5 x 1.8 x 2.6 cm. Increased parenchymal echogenicity. No hydronephrosis or solid mass. Left Kidney: Renal measurements: 10.7 x 5.0 x 4.2 cm = volume: 116 mL. Anechoic lesion with increased through transmission measures 1.8 x 1.7 x 1.6 cm. Increased parenchymal echogenicity. No hydronephrosis or  solid mass. Bladder: Appears normal for degree of bladder distention. Other: None. IMPRESSION: 1. Increased renal parenchymal echogenicity, compatible with chronic medical renal disease. 2. Bilateral renal cysts. Electronically Signed   By: Lorin Picket M.D.   On: 02/09/2022 16:30   DG Abd 1 View  Result Date: 02/09/2022 CLINICAL DATA:  Nausea, vomiting. EXAM: ABDOMEN - 1 VIEW COMPARISON:  November 26, 2007. FINDINGS: The bowel gas pattern is normal. No radio-opaque calculi or other significant radiographic abnormality are seen. IMPRESSION: Negative. Electronically Signed   By: Marijo Conception M.D.   On: 02/09/2022 15:40   CT Head Wo Contrast  Result Date: 02/09/2022 CLINICAL DATA:  Weakness, head injury after fall. EXAM: CT HEAD WITHOUT CONTRAST TECHNIQUE: Contiguous axial images were obtained from the base of the skull through the vertex without intravenous contrast. RADIATION DOSE REDUCTION: This exam was performed according to the departmental dose-optimization program which includes automated exposure control, adjustment of the mA and/or kV according to patient size and/or use of iterative reconstruction technique. COMPARISON:  October 22, 2020. FINDINGS: Brain: Mild chronic ischemic white matter disease is noted. No mass effect or midline shift is noted. Ventricular size is within normal limits. There is no evidence of mass lesion, hemorrhage or acute infarction. Vascular: No hyperdense vessel or unexpected calcification. Skull: Normal. Negative for fracture or focal lesion. Sinuses/Orbits: No acute finding. Other: None. IMPRESSION: No acute intracranial abnormality seen. Electronically Signed   By: Marijo Conception M.D.   On: 02/09/2022 11:05   DG Chest 2 View  Result Date: 02/09/2022 CLINICAL DATA:  81 year old male presenting with suspected sepsis and weakness. EXAM: CHEST - 2 VIEW COMPARISON:  October 22, 2020. FINDINGS: Cardiomediastinal contours and hilar structures are normal. Lungs are  clear. Small bilateral pleural effusions. On limited assessment there is no acute skeletal process. IMPRESSION: 1. Small bilateral pleural effusions. 2. No sign of consolidation. Electronically Signed   By: Zetta Bills M.D.   On: 02/09/2022 10:02      Subjective: Patient seen and examined.  Resting comfortably on the bed.  Denies any complaints.  Comfortable going home.  I called  patient's wife-she agreed with discharge to home with home health services and follow-up with PCP, ID and oncology outpatient.  Discharge Exam: Vitals:   03/07/22 0422 03/07/22 1022  BP: (!) 156/68 (!) 142/68  Pulse: 99 (!) 102  Resp: 16   Temp: 98.8 F (37.1 C)   SpO2: 100%    Vitals:   03/06/22 2120 03/07/22 0422 03/07/22 0500 03/07/22 1022  BP: (!) 156/76 (!) 156/68  (!) 142/68  Pulse: (!) 106 99  (!) 102  Resp: 18 16    Temp: 98.3 F (36.8 C) 98.8 F (37.1 C)    TempSrc: Oral Oral    SpO2: 100% 100%    Weight:   66.6 kg   Height:        General: Pt is alert, awake, not in acute distress, on room air, appears weak, cachectic, on room air, communicating well Cardiovascular: RRR, S1/S2 +, no rubs, no gallops Respiratory: CTA bilaterally, no wheezing, no rhonchi Abdominal: Soft, NT, ND, bowel sounds + Extremities: no edema, no cyanosis    The results of significant diagnostics from this hospitalization (including imaging, microbiology, ancillary and laboratory) are listed below for reference.     Microbiology: No results found for this or any previous visit (from the past 240 hour(s)).   Labs: BNP (last 3 results) No results for input(s): "BNP" in the last 8760 hours. Basic Metabolic Panel: Recent Labs  Lab 03/03/22 0420 03/04/22 0344 03/05/22 0400 03/06/22 0408 03/07/22 0428  NA 147* 141  141 142  142 142  142 141  K 4.3 3.8  3.8 4.2  4.2 4.4  4.4 4.1  CL 119* 116*  115* 116*  116* 117*  116* 118*  CO2 20* 20*  20* 19*  20* 19*  19* 22  GLUCOSE 144* 169*  165*  130*  130* 117*  121* 128*  BUN 65* 51*  49* 41*  41* 36*  36* 30*  CREATININE 2.90* 2.55*  2.47* 2.38*  2.42* 2.32*  2.29* 2.26*  CALCIUM 8.1* 8.1*  8.1* 7.9*  7.9* 8.1*  8.1* 7.9*  MG 2.0 1.8 1.8 1.8 1.6*  PHOS 3.8 3.2 3.2  3.1 2.9  2.9 2.9   Liver Function Tests: Recent Labs  Lab 03/01/22 0358 03/03/22 0420 03/04/22 0344 03/05/22 0400 03/06/22 0408 03/07/22 0428  AST 22  --   --  24  --  16  ALT 22  --   --  30  --  26  ALKPHOS 95  --   --  90  --  101  BILITOT 0.7  --   --  0.6  --  0.7  PROT 4.1*  --   --  4.2*  --  4.5*  ALBUMIN 2.3* 2.9* 2.6* 2.4*  2.5* 2.6* 2.5*   No results for input(s): "LIPASE", "AMYLASE" in the last 168 hours. No results for input(s): "AMMONIA" in the last 168 hours. CBC: Recent Labs  Lab 03/02/22 0357 03/03/22 0420 03/04/22 0344 03/05/22 0400 03/06/22 0408 03/07/22 0428  WBC 2.2* 2.0* 2.2* 2.3* 1.7* 1.9*  NEUTROABS 1.1* 1.0* 1.1* 1.0*  --  0.8*  HGB 8.7* 8.0* 8.6* 7.9* 8.4* 8.6*  HCT 27.3* 26.0* 27.1* 24.6* 26.9* 27.4*  MCV 87.2 88.7 87.1 86.9 90.6 88.1  PLT 106* 104* 88* 83* 69* 64*   Cardiac Enzymes: No results for input(s): "CKTOTAL", "CKMB", "CKMBINDEX", "TROPONINI" in the last 168 hours. BNP: Invalid input(s): "POCBNP" CBG: Recent Labs  Lab 03/06/22 2105 03/07/22 0101 03/07/22 0418 03/07/22  0720 03/07/22 1113  GLUCAP 179* 147* 127* 98 140*   D-Dimer No results for input(s): "DDIMER" in the last 72 hours. Hgb A1c No results for input(s): "HGBA1C" in the last 72 hours. Lipid Profile No results for input(s): "CHOL", "HDL", "LDLCALC", "TRIG", "CHOLHDL", "LDLDIRECT" in the last 72 hours. Thyroid function studies No results for input(s): "TSH", "T4TOTAL", "T3FREE", "THYROIDAB" in the last 72 hours.  Invalid input(s): "FREET3" Anemia work up No results for input(s): "VITAMINB12", "FOLATE", "FERRITIN", "TIBC", "IRON", "RETICCTPCT" in the last 72 hours. Urinalysis    Component Value Date/Time   COLORURINE  YELLOW 02/19/2022 0238   APPEARANCEUR CLEAR 02/19/2022 0238   LABSPEC 1.015 02/19/2022 0238   PHURINE 5.5 02/19/2022 0238   GLUCOSEU >1,000 (A) 02/19/2022 0238   HGBUR TRACE (A) 02/19/2022 0238   BILIRUBINUR NEGATIVE 02/19/2022 0238   KETONESUR NEGATIVE 02/19/2022 0238   PROTEINUR 30 (A) 02/19/2022 0238   UROBILINOGEN 0.2 09/17/2013 0640   NITRITE NEGATIVE 02/19/2022 0238   LEUKOCYTESUR NEGATIVE 02/19/2022 0238   Sepsis Labs Recent Labs  Lab 03/04/22 0344 03/05/22 0400 03/06/22 0408 03/07/22 0428  WBC 2.2* 2.3* 1.7* 1.9*   Microbiology No results found for this or any previous visit (from the past 240 hour(s)).   Time coordinating discharge: Over 30 minutes  SIGNED:   Mckinley Jewel, MD  [facility] 03/07/2022, 12:30 PM Pager   If 7PM-7AM, please contact night-coverage [email or web address]

## 2022-03-07 NOTE — Plan of Care (Signed)

## 2022-03-07 NOTE — Progress Notes (Signed)
Noted multiple small clots of blood on Pt's stool, MD notified, received new orders, will continue to monitor.

## 2022-03-07 NOTE — TOC Transition Note (Signed)
Transition of Care [facility]) - CM/SW Discharge Note   Patient Details  Name: Dezman Granda MRN: [id number] Date of Birth: 1940-12-27  [facility] CM/SW Contact:  Leeroy Cha, RN Phone Number: 03/07/2022, 10:03 AM   Clinical Narrative:    [facility] pt and [facility] through [facility].   Final next level of care: Home/Self Care Barriers to Discharge: Barriers Resolved   Patient Goals and CMS Choice Patient states their goals for this hospitalization and ongoing recovery are:: to go home CMS Medicare.gov Compare Post Acute Care list provided to:: Patient Choice offered to / list presented to : Spouse, Patient  Discharge Placement                       Discharge Plan and Services   Discharge Planning Services: CM Consult Post Acute Care Choice: Home Health                    HH Arranged: PT, OT [facility] Agency: [facility] Date [place]: 03/07/22 Time [place]: 1003 Representative spoke with at [place]: [email or web address]  Social Determinants of Health ([place]) Interventions     Readmission Risk Interventions     No data to display

## 2022-03-08 DIAGNOSIS — R7881 Bacteremia: Secondary | ICD-10-CM | POA: Diagnosis not present

## 2022-03-08 DIAGNOSIS — B952 Enterococcus as the cause of diseases classified elsewhere: Secondary | ICD-10-CM | POA: Diagnosis not present

## 2022-03-08 LAB — CBC
HCT: 28.2 % — ABNORMAL LOW (ref 39.0–52.0)
Hemoglobin: 8.9 g/dL — ABNORMAL LOW (ref 13.0–17.0)
MCH: 27.7 pg (ref 26.0–34.0)
MCHC: 31.6 g/dL (ref 30.0–36.0)
MCV: 87.9 fL (ref 80.0–100.0)
Platelets: 70 10*3/uL — ABNORMAL LOW (ref 150–400)
RBC: 3.21 MIL/uL — ABNORMAL LOW (ref 4.22–5.81)
RDW: 16.8 % — ABNORMAL HIGH (ref 11.5–15.5)
WBC: 1.9 10*3/uL — ABNORMAL LOW (ref 4.0–10.5)
nRBC: 0 % (ref 0.0–0.2)

## 2022-03-08 LAB — RENAL FUNCTION PANEL
Albumin: 2.2 g/dL — ABNORMAL LOW (ref 3.5–5.0)
Anion gap: 4 — ABNORMAL LOW (ref 5–15)
BUN: 28 mg/dL — ABNORMAL HIGH (ref 8–23)
CO2: 19 mmol/L — ABNORMAL LOW (ref 22–32)
Calcium: 7.8 mg/dL — ABNORMAL LOW (ref 8.9–10.3)
Chloride: 118 mmol/L — ABNORMAL HIGH (ref 98–111)
Creatinine, Ser: 2.25 mg/dL — ABNORMAL HIGH (ref 0.61–1.24)
GFR, Estimated: 29 mL/min — ABNORMAL LOW (ref >60)
Glucose, Bld: 131 mg/dL — ABNORMAL HIGH (ref 70–99)
Phosphorus: 2.7 mg/dL (ref 2.5–4.6)
Potassium: 4.2 mmol/L (ref 3.5–5.1)
Sodium: 141 mmol/L (ref 135–145)

## 2022-03-08 LAB — GLUCOSE, CAPILLARY
Glucose-Capillary: 122 mg/dL — ABNORMAL HIGH (ref 70–99)
Glucose-Capillary: 125 mg/dL — ABNORMAL HIGH (ref 70–99)
Glucose-Capillary: 190 mg/dL — ABNORMAL HIGH (ref 70–99)
Glucose-Capillary: 204 mg/dL — ABNORMAL HIGH (ref 70–99)

## 2022-03-08 NOTE — Progress Notes (Signed)
Patient with large brown stool, no blood noted.

## 2022-03-08 NOTE — Discharge Summary (Signed)
Physician Discharge Summary  Graeme Menees ZJI:[id number] DOB: 1940-11-04 DOA: 02/19/2022  PCP: [name], MD  Admit date: 02/19/2022 Discharge date: 03/08/2022  Admitted From: Home Disposition: Home with home health services  Recommendations for Outpatient Follow-up:  Follow up with PCP in 1-2 weeks Please repeat CBC, BMP and magnesium level on follow-up visit.  Consider outpatient nephrology referral if kidney function continues to get worse.  Not a good candidate for dialysis. follow-up with your oncologist in 2 weeks Follow-up with infectious disease on 7/6 at 2:45 PM  [place]: Yes Equipment/Devices: None Discharge Condition: Fair CODE STATUS: DNR Diet recommendation: Regular diet  Brief/Interim Summary:  81 year old male with history of CLL initially diagnosed in 2014, status postchemotherapy and currently on Imbruvica, DM 2, HTN, colorectal cancer 2009, who came into the hospital with progressive and generalized weakness.  Patient has been experiencing some weight loss and poor appetite for the past several months, was seen by GI in February as an outpatient and underwent work-up without any significant major findings.  He was taken off Imbruvica in April as it was believed that it may be related to that, however he continued to decline.  He was hospitalized just 2 weeks ago for dehydration, and when he left the hospital he was placed back on his Imbruvica.  While at home he continued to decline and was rehospitalized on 5/25.  Repeat CT scan of the abdomen and pelvis showed recurrence of the CLL, he was also found to be neutropenic, have a fever and have Enterococcus bacteremia.  Oncology and ID consulted. Palliative care has also been consulted for goals of care discussion. Nephrology was consulted on 02/27/2022 for worsening creatinine.  Neutropenic fever sepsis due to Enterococcus bacteremia - blood cultures speciated Enterococcus.   -ID consulted. 2D echo showed normal EF  of 60 to 65%, small pericardial effusion,  there was also a nodular calcification in the right coronary cusp likely sclerosis but if high suspicion for SBE, consider TEE.  ID recommended TEE however cardiology felt like the patient is high risk for TEE and this was canceled.   -ID recommends ampicillin for 2 weeks from 5/27, until 6/10, and if he is discharged earlier to transition to Zyvox, and repeat TEE prior to antibiotics being stop.   -Repeat transthoracic echo on 6/9: Negative for any acute findings.  He finished amoxicillin on 6/10 for total of 2 weeks duration. -Discussed with on-call ID Dr. Baxter Flattery on 6/10 via secure chat-okay to discharge from ID standpoint.  Follow-up outpatient on 7/6 with Dr. Gale Journey as a scheduled  Failure to thrive Dehydration/dysphagia/chronic cough CLL Physical deconditioning  -overall difficult situation and complex case given progression of his CLL with progressive lymphadenopathy and now complicated by persistent neutropenia and Enterococcus bacteremia with concern for infectious endocarditis.  -Dr Alen Blew saw him again on 6/7, poor prognosis due to progressive disease and poor performance status and multiorgan dysfunction. Recommends to treat the treatable and consideration of hospice if he does not get better.  - Palliative consulted as well, patient and wife want to give a trial of going home, see if he can get stronger before hospice. home health services arranged prior to the discharge   Acute kidney injury on CKD stage IIIb  -Baseline creatinine 1.7-2.5 -Nephrology consulted recommended.  Renal function improved with IV fluids.  IV fluids discontinued.  Nephrology recommended transition to comfort measures only if kidney function continues to get worse.  Nephrology signed off.  Diarrhea  -Possibly from tube feeds  and antibiotic use.  Imodium as needed.  Patient had a rectal tube for the last few days which was dislodged on 03/02/22. -Improved   Pancytopenia  -Due to malignancy.  Patient received a few doses of Granix as per oncology recommendations for neutropenic fever.  Counts overall have remained stable but drifting down in the setting of CLL -Commend to follow-up with oncology outpatient   Hyperlipidemia -Continued statin  Essential hypertension  -continued metoprolol   Hyperlipidemia -Continued statin    Diabetes mellitus type 2: Well-controlled.  A1c 6.9%.  Blood in stool: RN noted blood clots in his stool last night.  Patient discharge was canceled.  Observed overnight.  H&H remained stable.  No further bloody bowel movement.  Patient discharged home in stable condition.  Discharge Diagnoses:  Neutropenic fever Sepsis due to Enterococcus bacteremia Failure to thrive Dehydration Dysphagia CLL Physical deconditioning AKI on CKD stage IIIb Diarrhea Pancytopenia Hypertension Hyperlipidemia Type 2 diabetes with hyperglycemia Severe protein calorie malnutrition/hypoalbuminemia Blood in stool   Discharge Instructions  Discharge Instructions     Diet - low sodium heart healthy   Complete by: As directed    Increase activity slowly   Complete by: As directed    Increase activity slowly   Complete by: As directed    No wound care   Complete by: As directed    No wound care   Complete by: As directed       Allergies as of 03/08/2022       Reactions   Hydrocodone-acetaminophen Other (See Comments)   Loss of weight        Medication List     STOP taking these medications    aspirin 81 MG tablet   metFORMIN 1000 MG tablet Commonly known as: GLUCOPHAGE       TAKE these medications    acetaminophen 325 MG tablet Commonly known as: TYLENOL Take 2 tablets (650 mg total) by mouth every 6 (six) hours as needed for fever.   amLODipine 2.5 MG tablet Commonly known as: NORVASC Take 2.5 mg by mouth daily. Notes to patient: Resume home regimen   atorvastatin 40 MG tablet Commonly known as: LIPITOR Take 40  mg by mouth daily.   famotidine 40 MG tablet Commonly known as: PEPCID Take 40 mg by mouth at bedtime. Notes to patient: Resume home regimen   feeding supplement Liqd Take 237 mLs by mouth 2 (two) times daily between meals.   hydroxypropyl methylcellulose / hypromellose 2.5 % ophthalmic solution Commonly known as: ISOPTO TEARS / GONIOVISC Place 1 drop into both eyes in the morning. Notes to patient: Resume home regimen   Imbruvica 420 MG tablet Generic drug: ibrutinib TAKE 1 TABLET BY MOUTH DAILY Notes to patient: Resume home regimen   lisinopril 20 MG tablet Commonly known as: ZESTRIL Take 20 mg by mouth every morning. Notes to patient: Resume home regimen   loperamide 2 MG capsule Commonly known as: IMODIUM Take 1 capsule (2 mg total) by mouth every 6 (six) hours as needed for diarrhea or loose stools. Notes to patient: Last dose given 06/07 11:16am   metoprolol tartrate 25 MG tablet Commonly known as: LOPRESSOR Take 12.5 mg by mouth 2 (two) times daily.   multivitamin with minerals Tabs tablet Take 1 tablet by mouth daily.   ondansetron 8 MG disintegrating tablet Commonly known as: ZOFRAN-ODT TAKE 1 TABLET BY MOUTH EVERY 8 HOURS AS NEEDED FOR NAUSEA AND VOMITING What changed: See the new instructions. Notes to patient: Last dose  given 06/10 02:39pm   pantoprazole 40 MG tablet Commonly known as: PROTONIX Take 1 tablet (40 mg total) by mouth 2 (two) times daily. What changed: when to take this   prochlorperazine 10 MG tablet Commonly known as: COMPAZINE TAKE 1 TABLET BY MOUTH EVERY 6 HOURS AS NEEDED FOR NAUSEA OR VOMITING.   zinc sulfate 220 (50 Zn) MG capsule Take 1 capsule (220 mg total) by mouth daily.   zolpidem 10 MG tablet Commonly known as: AMBIEN Take 5 mg by mouth at bedtime. Notes to patient: Resume home regimen        Follow-up Information     [name], MD Follow up in 1 week(s).   Specialty: Family Medicine Contact  information: [place] [address] [phone]         Jabier Mutton, MD. Call on 04/02/2022.   Specialty: Infectious Diseases Contact information: [address]  [place] [address] [phone]         Wyatt Portela, MD Follow up in 1 week(s).   Specialty: Oncology Contact information: [place] [address] [phone]                Allergies  Allergen Reactions   Hydrocodone-Acetaminophen Other (See Comments)    Loss of weight    Consultations: Nephrology Oncology Infectious disease   Procedures/Studies: ECHOCARDIOGRAM COMPLETE  Result Date: 03/06/2022    ECHOCARDIOGRAM REPORT   Patient Name:   REVEL STELLMACH Date of Exam: 03/06/2022 Medical Rec #:  [id number]       Height:       67.0 in Accession #:    [id number]      Weight:       146.4 lb Date of Birth:  1941/06/30        BSA:          1.771 m Patient Age:    81 years        BP:           153/87 mmHg Patient Gender: M               HR:           91 bpm. Exam Location:  Inpatient Procedure: 2D Echo, Cardiac Doppler and Color Doppler Indications:    Bacteremia  History:        Patient has prior history of Echocardiogram examinations, most                 recent 02/21/2022. Risk Factors:Hypertension and Diabetes.  Sonographer:    Bernadene Person RDCS Referring Phys: [place]  1. Left ventricular ejection fraction, by estimation, is 60 to 65%. The left ventricle has normal function. The left ventricle has no regional wall motion abnormalities. Left ventricular diastolic parameters are consistent with Grade I diastolic dysfunction (impaired relaxation).  2. Right ventricular systolic function is normal. The right ventricular size is normal. There is normal pulmonary artery systolic pressure. The estimated right ventricular systolic pressure is 01.7 mmHg.  3. The mitral valve is normal in structure. Trivial mitral valve  regurgitation. No evidence of mitral stenosis.  4. The aortic valve is tricuspid. There is mild calcification of the aortic valve. Aortic valve regurgitation is not visualized. No aortic stenosis is present.  5. The inferior vena cava is normal in size with greater than 50% respiratory variability, suggesting right atrial pressure of 3 mmHg.  6. A small pericardial effusion is  present. The pericardial effusion is circumferential. FINDINGS  Left Ventricle: Left ventricular ejection fraction, by estimation, is 60 to 65%. The left ventricle has normal function. The left ventricle has no regional wall motion abnormalities. The left ventricular internal cavity size was normal in size. There is  no left ventricular hypertrophy. Left ventricular diastolic parameters are consistent with Grade I diastolic dysfunction (impaired relaxation). Right Ventricle: The right ventricular size is normal. No increase in right ventricular wall thickness. Right ventricular systolic function is normal. There is normal pulmonary artery systolic pressure. The tricuspid regurgitant velocity is 2.86 m/s, and  with an assumed right atrial pressure of 3 mmHg, the estimated right ventricular systolic pressure is 66.5 mmHg. Left Atrium: Left atrial size was normal in size. Right Atrium: Right atrial size was normal in size. Pericardium: A small pericardial effusion is present. The pericardial effusion is circumferential. Mitral Valve: The mitral valve is normal in structure. Trivial mitral valve regurgitation. No evidence of mitral valve stenosis. Tricuspid Valve: The tricuspid valve is normal in structure. Tricuspid valve regurgitation is trivial. Aortic Valve: The aortic valve is tricuspid. There is mild calcification of the aortic valve. Aortic valve regurgitation is not visualized. No aortic stenosis is present. Pulmonic Valve: The pulmonic valve was normal in structure. Pulmonic valve regurgitation is not visualized. Aorta: The aortic root is  normal in size and structure. Venous: The inferior vena cava is normal in size with greater than 50% respiratory variability, suggesting right atrial pressure of 3 mmHg. IAS/Shunts: No atrial level shunt detected by color flow Doppler.  LEFT VENTRICLE PLAX 2D LVIDd:         3.70 cm     Diastology LVIDs:         2.30 cm     LV e' medial:    5.26 cm/s LV PW:         1.00 cm     LV E/e' medial:  10.8 LV IVS:        1.00 cm     LV e' lateral:   6.31 cm/s LVOT diam:     2.10 cm     LV E/e' lateral: 9.0 LV SV:         66 LV SV Index:   37 LVOT Area:     3.46 cm  LV Volumes (MOD) LV vol d, MOD A2C: 61.6 ml LV vol d, MOD A4C: 65.1 ml LV vol s, MOD A2C: 23.6 ml LV vol s, MOD A4C: 25.3 ml LV SV MOD A2C:     38.0 ml LV SV MOD A4C:     65.1 ml LV SV MOD BP:      38.3 ml RIGHT VENTRICLE RV S prime:     10.40 cm/s TAPSE (M-mode): 2.1 cm LEFT ATRIUM             Index        RIGHT ATRIUM           Index LA diam:        3.10 cm 1.75 cm/m   RA Area:     11.40 cm LA Vol (A2C):   26.3 ml 14.85 ml/m  RA Volume:   22.60 ml  12.76 ml/m LA Vol (A4C):   30.9 ml 17.45 ml/m LA Biplane Vol: 29.8 ml 16.83 ml/m  AORTIC VALVE LVOT Vmax:   90.00 cm/s LVOT Vmean:  58.300 cm/s LVOT VTI:    0.190 m  AORTA Ao Root diam: 3.40 cm Ao Asc diam:  3.10 cm  MITRAL VALVE               TRICUSPID VALVE MV Area (PHT): 5.62 cm    TR Peak grad:   32.7 mmHg MV Decel Time: 135 msec    TR Vmax:        286.00 cm/s MV E velocity: 57.00 cm/s MV A velocity: 77.60 cm/s  SHUNTS MV E/A ratio:  0.73        Systemic VTI:  0.19 m                            Systemic Diam: 2.10 cm Dalton McleanMD Electronically signed by Franki Monte Signature Date/Time: 03/06/2022/5:36:10 PM    Final    DG Swallowing Func-Speech Pathology  Result Date: 03/02/2022 Table formatting from the original result was not included. Objective Swallowing Evaluation: Type of Study: MBS-Modified Barium Swallow Study  Patient Details Name: Yorel Redder MRN: [id number] Date of Birth: [birth year]  Today's Date: 03/02/2022 Time: SLP Start Time (ACUTE ONLY): 1215 -SLP Stop Time (ACUTE ONLY): 2620 SLP Time Calculation (min) (ACUTE ONLY): 20 min Past Medical History: Past Medical History: Diagnosis Date  CLL (chronic lymphocytic leukemia) ([place])   chemotherapy  Colon cancer ([place])   Colorectal cancer ([place])   COLORECTAL DX 2/09, treated surgically  Diabetes mellitus   Hypercholesterolemia   Hypertension   Pneumonia   Bilateral pneumonia Past Surgical History: Past Surgical History: Procedure Laterality Date  HEMICOLECTOMY    nasal polyps    S/P nasal surgery HPI: 81yo male admitted 02/19/22 with generalized weakness, recurrent falls. PMH: CLL, DM2, HTN, colorectal cancer (2009), HLD. Recent hospitalization due to poor appetite/intake, FTT.  Pt had COVID and has had cough since his COVID.  MBS on 5/26 completed with recommendation for NPO.Patient and family are declining PEG and repeat MBS planned to aid in decision making regarding PO's.  Subjective: pleasant, cooperative  Recommendations for follow up therapy are one component of a multi-disciplinary discharge planning process, led by the attending physician.  Recommendations may be updated based on patient status, additional functional criteria and insurance authorization. Assessment / Plan / Recommendation   03/02/2022   1:40 PM Clinical Impressions Clinical Impression Patient presents with some improvements in swallow function as per this repeat MBS as compared to MBS on 02/20/22. He continues to exhibit swallow initiation delays at level of vallecular sinus with all consistencies (puree, honey, nectar, regular, barium tablet, thin). With thin liquids, patient had initially trace vallecular residuals and moderate pyriform sinus residuals. Pyriform residuals cleared to minimal amount with 2-3 extra/dry swallows. Patient did exhibit very trace penetration during the swallow with thin liquids and trace to mild penetration from barium flowing out of full pyriform sinus  but in all instances, penetrate remained above vocal cords and fully excited laryngeal vestibule. Penetration observed after teh swallow with nectar thick liquids (PAS 2) with no aspiration and full clearance of penetrate from laryngeal vestibule. No aspiration obseved prior to, during or after the swallow with any of the tested barium consistencies. Vallecular residuals were trace to mild in amount for thin liquid, nectar thick, honey thick, puree and regular solids; pyriform sinus residuals were moderate in amount for thin liquid and mild for nectar, honey, puree and regular solids. Barium tablet (1/2 of a 48m tablet) became lodged in vallecular sinus and despite multiple trials, was not able to be cleared. Overall, patient appears to be protecting his airway better during the swallow and is able  to more effectively clear pharyngeal residuals with extra/dry swallows. He will continue to be at risk for aspiration due to pyriform sinus residuals but aspiration risk can be reduced by using strategies of swallowing an extra 2-3 times for each bite/sip, fully masticating food before swallowing, and sitting upright during and after meals. SLP Visit Diagnosis Dysphagia, unspecified (R13.10) Impact on safety and function Moderate aspiration risk     03/02/2022   1:40 PM Treatment Recommendations Treatment Recommendations Therapy as outlined in treatment plan below     03/02/2022   1:52 PM Prognosis Prognosis for Safe Diet Advancement Good Barriers to Reach Goals Severity of deficits   03/02/2022   1:40 PM Diet Recommendations SLP Diet Recommendations Dysphagia 3 (Mech soft) solids;Thin liquid Liquid Administration via Cup;Straw Medication Administration Crushed with puree Compensations Slow rate;Small sips/bites Postural Changes Seated upright at 90 degrees;Remain semi-upright after after feeds/meals (Comment)     03/02/2022   1:40 PM Other Recommendations Oral Care Recommendations Oral care BID Follow Up Recommendations Home  health SLP Assistance recommended at discharge Frequent or constant Supervision/Assistance Functional Status Assessment Patient has had a recent decline in their functional status and demonstrates the ability to make significant improvements in function in a reasonable and predictable amount of time.   03/02/2022   1:40 PM Frequency and Duration  Speech Therapy Frequency (ACUTE ONLY) min 2x/week Treatment Duration 1 week     03/02/2022  12:58 PM Oral Phase Oral Phase [facility]    03/02/2022   1:38 PM Pharyngeal Phase Pharyngeal- Thin Cup Penetration/Aspiration during swallow;Penetration/Apiration after swallow Pharyngeal Material enters airway, remains ABOVE vocal cords then ejected out Pharyngeal- Thin Straw Penetration/Aspiration during swallow;Penetration/Apiration after swallow Pharyngeal Material enters airway, remains ABOVE vocal cords then ejected out    03/02/2022   1:39 PM Cervical Esophageal Phase  Cervical Esophageal Phase Impaired Honey Cup Reduced cricopharyngeal relaxation Nectar Cup Reduced cricopharyngeal relaxation Thin Cup Reduced cricopharyngeal relaxation Thin Straw Reduced cricopharyngeal relaxation Puree Reduced cricopharyngeal relaxation Regular Reduced cricopharyngeal relaxation Pill Other (Comment) Sonia Baller, MA, CCC-SLP Speech Therapy                     DG Abd 1 View  Result Date: 02/28/2022 CLINICAL DATA:  Feeding tube placement. EXAM: ABDOMEN - 1 VIEW COMPARISON:  Prior today FINDINGS: Feeding tube remains unchanged in position, with tip in the region of the GE junction. No evidence of dilated bowel loops. IMPRESSION: Feeding tube tip remains near the GE junction. Electronically Signed   By: Marlaine Hind M.D.   On: 02/28/2022 15:07   DG Abd Portable 1V  Result Date: 02/28/2022 CLINICAL DATA:  Feeding tube placement. EXAM: PORTABLE ABDOMEN - 1 VIEW COMPARISON:  None Available. FINDINGS: A feeding tube is now seen with tip overlying the GE junction. No evidence of dilated bowel loops  IMPRESSION: Feeding tube tip overlies the GE junction. Electronically Signed   By: Marlaine Hind M.D.   On: 02/28/2022 13:01   US RENAL  Result Date: 02/26/2022 CLINICAL DATA:  Renal failure.  History of colon cancer.  CLL. EXAM: RENAL / URINARY TRACT ULTRASOUND COMPLETE COMPARISON:  CT abdomen and pelvis without contrast 02/19/2022 FINDINGS: Right Kidney: Renal measurements: 11.1 by 4.0 x 4.5 cm = volume: 103 mL. Echogenicity within normal limits. There are benign anechoic avascular simple cysts measuring up to 2.4 cm within the right upper pole and 2.0 cm within the right midpole. No hydronephrosis. Left Kidney: Renal measurements: 11.3 x 5.6 x 4.6 cm =  volume: 15.5 mL. Echogenicity within normal limits. Left midpole anechoic avascular simple cyst measuring 1.9 cm. No hydronephrosis. Bladder: Appears normal for degree of bladder distention. Other: Mild free fluid is seen within the pelvis as on recent CT. IMPRESSION: 1. Bilateral benign simple renal cysts. 2. No hydronephrosis. 3. Mild free fluid within the pelvis as on recent CT. Electronically Signed   By: Yvonne Kendall M.D.   On: 02/26/2022 13:49   DG CHEST PORT 1 VIEW  Result Date: 02/23/2022 CLINICAL DATA:  Weakness. Colon cancer and chronic lymphocytic leukemia. EXAM: PORTABLE CHEST 1 VIEW COMPARISON:  02/19/2022 FINDINGS: The heart size and mediastinal contours are within normal limits. Aortic atherosclerotic calcification incidentally noted. A Dobbhoff feeding tube is seen in place with the tip in the proximal stomach. Low lung volumes are seen with bibasilar atelectasis, new since prior exam. IMPRESSION: Low lung volumes with bibasilar atelectasis. Dobbhoff feeding tube tip in proximal stomach. Electronically Signed   By: Marlaine Hind M.D.   On: 02/23/2022 10:30   ECHOCARDIOGRAM COMPLETE  Result Date: 02/21/2022    ECHOCARDIOGRAM REPORT   Patient Name:   JARYN ROSKO Date of Exam: 02/21/2022 Medical Rec #:  [id number]       Height:       67.0  in Accession #:    [id number]      Weight:       121.7 lb Date of Birth:  1940/11/06        BSA:          1.637 m Patient Age:    60 years        BP:           154/63 mmHg Patient Gender: M               HR:           105 bpm. Exam Location:  Inpatient Procedure: 2D Echo, Cardiac Doppler, Color Doppler and Strain Analysis Indications:    Bacteremia  History:        Patient has prior history of Echocardiogram examinations, most                 recent 10/23/2020. Risk Factors:Dyslipidemia, Hypertension and                 Diabetes.  Sonographer:    Luisa Hart RDCS Referring Phys: [place]  1. Strain abnormal -11.5 but poor tracking not reported EF is hyperdynamic.  2. Left ventricular ejection fraction, by estimation, is 60 to 65%. The left ventricle has normal function. The left ventricle has no regional wall motion abnormalities. Left ventricular diastolic parameters were normal.  3. Right ventricular systolic function is normal. The right ventricular size is normal.  4. The pericardial effusion is posterior to the left ventricle and anterior to the right ventricle.  5. The mitral valve is normal in structure. No evidence of mitral valve regurgitation. No evidence of mitral stenosis.  6. Nodular calcification of right coronary cusp likely sclerosis If suspicion of SBE high consider TEE. The aortic valve is normal in structure. There is moderate calcification of the aortic valve. There is moderate thickening of the aortic valve. Aortic valve regurgitation is not visualized. Aortic valve sclerosis/calcification is present, without any evidence of aortic stenosis.  7. The inferior vena cava is normal in size with greater than 50% respiratory variability, suggesting right atrial pressure of 3 mmHg. FINDINGS  Left Ventricle: Left ventricular ejection fraction, by estimation, is  60 to 65%. The left ventricle has normal function. The left ventricle has no regional wall motion abnormalities.  Global longitudinal strain performed but not reported based on interpreter judgement due to suboptimal tracking. The left ventricular internal cavity size was normal in size. There is no asymmetric left ventricular hypertrophy of the basal and septal segments. Left ventricular diastolic parameters were normal. Right Ventricle: The right ventricular size is normal. No increase in right ventricular wall thickness. Right ventricular systolic function is normal. Left Atrium: Left atrial size was normal in size. Right Atrium: Right atrial size was normal in size. Pericardium: Trivial pericardial effusion is present. The pericardial effusion is posterior to the left ventricle and anterior to the right ventricle. Mitral Valve: The mitral valve is normal in structure. No evidence of mitral valve regurgitation. No evidence of mitral valve stenosis. MV peak gradient, 4.1 mmHg. The mean mitral valve gradient is 2.0 mmHg. Tricuspid Valve: The tricuspid valve is normal in structure. Tricuspid valve regurgitation is mild . No evidence of tricuspid stenosis. Aortic Valve: Nodular calcification of right coronary cusp likely sclerosis If suspicion of SBE high consider TEE. The aortic valve is normal in structure. There is moderate calcification of the aortic valve. There is moderate thickening of the aortic valve. Aortic valve regurgitation is not visualized. Aortic valve sclerosis/calcification is present, without any evidence of aortic stenosis. Aortic valve mean gradient measures 3.0 mmHg. Aortic valve peak gradient measures 6.2 mmHg. Aortic valve area, by VTI measures 2.20 cm. Pulmonic Valve: The pulmonic valve was normal in structure. Pulmonic valve regurgitation is not visualized. No evidence of pulmonic stenosis. Aorta: The aortic root is normal in size and structure. Venous: The inferior vena cava is normal in size with greater than 50% respiratory variability, suggesting right atrial pressure of 3 mmHg. IAS/Shunts: No  atrial level shunt detected by color flow Doppler. Additional Comments: Strain abnormal -11.5 but poor tracking not reported EF is hyperdynamic.  LEFT VENTRICLE PLAX 2D LVIDd:         2.60 cm     Diastology LVIDs:         1.10 cm     LV e' medial:    5.33 cm/s LV PW:         1.60 cm     LV E/e' medial:  11.2 LV IVS:        1.90 cm     LV e' lateral:   7.07 cm/s LVOT diam:     2.00 cm     LV E/e' lateral: 8.4 LV SV:         41 LV SV Index:   25 LVOT Area:     3.14 cm  LV Volumes (MOD) LV vol d, MOD A2C: 39.6 ml LV vol d, MOD A4C: 42.6 ml LV vol s, MOD A2C: 9.8 ml LV vol s, MOD A4C: 7.4 ml LV SV MOD A2C:     29.8 ml LV SV MOD A4C:     42.6 ml LV SV MOD BP:      33.2 ml RIGHT VENTRICLE RV Basal diam:  3.10 cm RV Mid diam:    2.20 cm RV S prime:     21.60 cm/s TAPSE (M-mode): 2.0 cm LEFT ATRIUM           Index        RIGHT ATRIUM          Index LA diam:      2.70 cm 1.65 cm/m   RA Area:  9.61 cm LA Vol (A4C): 27.6 ml 16.86 ml/m  RA Volume:   15.60 ml 9.53 ml/m  AORTIC VALVE                    PULMONIC VALVE AV Area (Vmax):    2.24 cm     PV Vmax:       0.98 m/s AV Area (Vmean):   2.09 cm     PV Vmean:      66.200 cm/s AV Area (VTI):     2.20 cm     PV VTI:        0.132 m AV Vmax:           124.00 cm/s  PV Peak grad:  3.8 mmHg AV Vmean:          86.300 cm/s  PV Mean grad:  2.0 mmHg AV VTI:            0.186 m AV Peak Grad:      6.2 mmHg AV Mean Grad:      3.0 mmHg LVOT Vmax:         88.60 cm/s LVOT Vmean:        57.400 cm/s LVOT VTI:          0.130 m LVOT/AV VTI ratio: 0.70  AORTA Ao Root diam: 3.10 cm Ao Asc diam:  2.70 cm MITRAL VALVE               TRICUSPID VALVE MV Area (PHT): 4.49 cm    TR Peak grad:   29.8 mmHg MV Area VTI:   2.35 cm    TR Vmax:        273.00 cm/s MV Peak grad:  4.1 mmHg MV Mean grad:  2.0 mmHg    SHUNTS MV Vmax:       1.01 m/s    Systemic VTI:  0.13 m MV Vmean:      65.5 cm/s   Systemic Diam: 2.00 cm MV Decel Time: 169 msec MV E velocity: 59.60 cm/s MV A velocity: 88.60 cm/s MV E/A  ratio:  0.67 Jenkins Rouge MD Electronically signed by Jenkins Rouge MD Signature Date/Time: 02/21/2022/11:07:24 AM    Final    DG Abd Portable 1V  Result Date: 02/20/2022 CLINICAL DATA:  NG tube placement EXAM: PORTABLE ABDOMEN - 1 VIEW COMPARISON:  02/09/2022 FINDINGS: Esophageal tube tip overlies the proximal stomach. There is a small amount of enteral contrast in the stomach and colon. IMPRESSION: Esophageal tube tip overlies the gastric fundus Electronically Signed   By: Donavan Foil M.D.   On: 02/20/2022 15:58   DG Swallowing Func-Speech Pathology  Result Date: 02/20/2022 Table formatting from the original result was not included. Objective Swallowing Evaluation: Type of Study: MBS-Modified Barium Swallow Study  Patient Details Name: Rylen Swindler MRN: [id number] Date of Birth: [birth year] Today's Date: 02/20/2022 Time: SLP Start Time (ACUTE ONLY): 7858 -SLP Stop Time (ACUTE ONLY): 8502 SLP Time Calculation (min) (ACUTE ONLY): 20 min Past Medical History: Past Medical History: Diagnosis Date  CLL (chronic lymphocytic leukemia) ([place])   chemotherapy  Colon cancer ([place])   Colorectal cancer ([place])   COLORECTAL DX 2/09, treated surgically  Diabetes mellitus   Hypercholesterolemia   Hypertension   Pneumonia   Bilateral pneumonia Past Surgical History: Past Surgical History: Procedure Laterality Date  HEMICOLECTOMY    nasal polyps    S/P nasal surgery HPI: 81yo male admitted 02/19/22 with generalized weakness, recurrent falls. PMH: CLL, DM2, HTN, colorectal cancer (2009),  HLD. Recent hospitalization due to poor appetite/intake, FTT.  Subjective: Pt seen in radiology for MBS.  Recommendations for follow up therapy are one component of a multi-disciplinary discharge planning process, led by the attending physician.  Recommendations may be updated based on patient status, additional functional criteria and insurance authorization. Assessment / Plan / Recommendation   02/20/2022   3:18 PM Clinical Impressions Clinical  Impression Pt presents with mild oral, moderate-severe  pharyngeal dysphagia, characterized as follows: Orally, pt exhibits slightly decreased bolus prep, and posterior spillage over tongue base. Pharyngeal swallow is characterized by trigger of the swallow reflex at the vallecular sinus on thin liquids and puree, and at the pyriform sinus on nectar thick liquids. Trace tongue base and vallecular sinus residue noted across consistencies. Pyriform residue present across consistencies, and increased with viscosity of texture. Pt exhibited trace aspiration from pyriform sinus residue on all consistencies given with significantly delayed or absent cough response. Risk of aspiration increases further as residue thins with secretions or additional boluses. Recommend NPO at this time, including medications. Medical team was informed of results and recommendations. Results were also discussed with pt and wife. SLP will follow at bedside to begin dysphagia treatment with the goal for return to PO status safely.  SLP Visit Diagnosis Dysphagia, oropharyngeal phase (R13.12) Impact on safety and function Severe aspiration risk;Risk for inadequate nutrition/hydration     02/20/2022   3:18 PM Treatment Recommendations Treatment Recommendations Therapy as outlined in treatment plan below     02/20/2022   3:28 PM Prognosis Prognosis for Safe Diet Advancement Fair Barriers to Reach Goals Severity of deficits   02/20/2022   3:18 PM Diet Recommendations SLP Diet Recommendations NPO Medication Administration Via alternative means     02/20/2022   3:18 PM Other Recommendations Oral Care Recommendations Oral care QID;Staff/trained caregiver to provide oral care Other Recommendations Have oral suction available Follow Up Recommendations Other (comment) Assistance recommended at discharge Frequent or constant Supervision/Assistance Functional Status Assessment Patient has had a recent decline in their functional status and demonstrates the  ability to make significant improvements in function in a reasonable and predictable amount of time.   02/20/2022   3:18 PM Frequency and Duration  Speech Therapy Frequency (ACUTE ONLY) min 1 x/week Treatment Duration 1 week     02/20/2022   3:18 PM Oral Phase Oral Phase [facility]    02/20/2022   3:18 PM Pharyngeal Phase Pharyngeal Phase Impaired    02/20/2022   3:18 PM Cervical Esophageal Phase  Cervical Esophageal Phase Impaired [facility] [place] Speech Language Pathologist Office: [phone] Shonna Chock 02/20/2022, 3:30 PM                     CT CHEST WO CONTRAST  Result Date: 02/19/2022 CLINICAL DATA:  CLL. History of colon cancer. Metastatic disease evaluation. Cough, nausea/vomiting. EXAM: CT CHEST, ABDOMEN AND PELVIS WITHOUT CONTRAST TECHNIQUE: Multidetector CT imaging of the chest, abdomen and pelvis was performed following the standard protocol without IV contrast. RADIATION DOSE REDUCTION: This exam was performed according to the departmental dose-optimization program which includes automated exposure control, adjustment of the mA and/or kV according to patient size and/or use of iterative reconstruction technique. COMPARISON:  CTA chest dated 10/22/2020. CT abdomen/pelvis dated 02/28/2020. FINDINGS: CT CHEST FINDINGS Cardiovascular: The heart is normal in size. Small pericardial effusion. No evidence of thoracic aortic aneurysm. Atherosclerotic calcifications of the aortic arch. Mild three-vessel coronary atherosclerosis. Mediastinum/Nodes: Mediastinal lymphadenopathy, poorly evaluated due to lack  of interest contrast administration, noting a dominant 15 mm short axis subcarinal node (series 3/image 29). This previously measured 11 mm. Visualized thyroid is unremarkable. Lungs/Pleura: Small bilateral pleural effusions, right greater than left. Associated patchy bilateral lower lobe opacities, favoring atelectasis. No frank interstitial edema. Evaluation lung parenchyma is constrained by  respiratory motion. Within that constraint, there are no suspicious pulmonary nodules. No pneumothorax. Musculoskeletal: No focal osseous lesions. CT ABDOMEN PELVIS FINDINGS Hepatobiliary: Unenhanced liver is notable for scattered hepatic cysts measuring up to 14 mm (series 3/image 1), benign. Gallbladder is unremarkable. No intrahepatic or extrahepatic ductal dilatation. Pancreas: Within normal limits. Spleen: Within normal limits. Adrenals/Urinary Tract: Adrenal glands are within normal limits. Bilateral renal cysts, measuring up to 2.2 cm in the right upper kidney (series 3/image 60). No hydronephrosis. Bladder is within normal limits. Stomach/Bowel: Stomach is within normal limits. No evidence of bowel obstruction. Appendix is not discretely visualized. Status post left hemicolectomy with suture line in the lower pelvis (series 3/image 105). Vascular/Lymphatic: No evidence of abdominal aortic aneurysm. Atherosclerotic calcifications of the abdominal aorta and branch vessels. Bulky upper abdominal lymphadenopathy, poorly evaluated on unenhanced CT but new from recent CTA, including a dominant 3.9 cm node in the porta hepatis (series 3/image 58). Additional 2.4 cm short axis node in the splenic hilum (series 3/image 9), new. Para-aortic lymphadenopathy, including a dominant 16 mm short axis node on the left (series 3/image 65), previously 12 mm. Reproductive: Prostate is notable for dystrophic calcifications. Other: Small volume abdominopelvic ascites. Musculoskeletal: Visualized osseous structures are within normal limits. IMPRESSION: Limited evaluation due to lack of intravenous contrast administration. Bulky upper abdominal lymphadenopathy, new. Progressive mediastinal and retroperitoneal lymphadenopathy. These are poorly evaluated but favor progressive lymphoma. Status post left hemicolectomy. No findings specific for recurrent or metastatic disease. Small bilateral pleural effusions, right greater than left.  Small volume abdominopelvic ascites. Electronically Signed   By: Julian Hy M.D.   On: 02/19/2022 21:14   CT ABDOMEN PELVIS WO CONTRAST  Result Date: 02/19/2022 CLINICAL DATA:  CLL. History of colon cancer. Metastatic disease evaluation. Cough, nausea/vomiting. EXAM: CT CHEST, ABDOMEN AND PELVIS WITHOUT CONTRAST TECHNIQUE: Multidetector CT imaging of the chest, abdomen and pelvis was performed following the standard protocol without IV contrast. RADIATION DOSE REDUCTION: This exam was performed according to the departmental dose-optimization program which includes automated exposure control, adjustment of the mA and/or kV according to patient size and/or use of iterative reconstruction technique. COMPARISON:  CTA chest dated 10/22/2020. CT abdomen/pelvis dated 02/28/2020. FINDINGS: CT CHEST FINDINGS Cardiovascular: The heart is normal in size. Small pericardial effusion. No evidence of thoracic aortic aneurysm. Atherosclerotic calcifications of the aortic arch. Mild three-vessel coronary atherosclerosis. Mediastinum/Nodes: Mediastinal lymphadenopathy, poorly evaluated due to lack of interest contrast administration, noting a dominant 15 mm short axis subcarinal node (series 3/image 29). This previously measured 11 mm. Visualized thyroid is unremarkable. Lungs/Pleura: Small bilateral pleural effusions, right greater than left. Associated patchy bilateral lower lobe opacities, favoring atelectasis. No frank interstitial edema. Evaluation lung parenchyma is constrained by respiratory motion. Within that constraint, there are no suspicious pulmonary nodules. No pneumothorax. Musculoskeletal: No focal osseous lesions. CT ABDOMEN PELVIS FINDINGS Hepatobiliary: Unenhanced liver is notable for scattered hepatic cysts measuring up to 14 mm (series 3/image 1), benign. Gallbladder is unremarkable. No intrahepatic or extrahepatic ductal dilatation. Pancreas: Within normal limits. Spleen: Within normal limits.  Adrenals/Urinary Tract: Adrenal glands are within normal limits. Bilateral renal cysts, measuring up to 2.2 cm in the right upper kidney (series 3/image  60). No hydronephrosis. Bladder is within normal limits. Stomach/Bowel: Stomach is within normal limits. No evidence of bowel obstruction. Appendix is not discretely visualized. Status post left hemicolectomy with suture line in the lower pelvis (series 3/image 105). Vascular/Lymphatic: No evidence of abdominal aortic aneurysm. Atherosclerotic calcifications of the abdominal aorta and branch vessels. Bulky upper abdominal lymphadenopathy, poorly evaluated on unenhanced CT but new from recent CTA, including a dominant 3.9 cm node in the porta hepatis (series 3/image 58). Additional 2.4 cm short axis node in the splenic hilum (series 3/image 9), new. Para-aortic lymphadenopathy, including a dominant 16 mm short axis node on the left (series 3/image 65), previously 12 mm. Reproductive: Prostate is notable for dystrophic calcifications. Other: Small volume abdominopelvic ascites. Musculoskeletal: Visualized osseous structures are within normal limits. IMPRESSION: Limited evaluation due to lack of intravenous contrast administration. Bulky upper abdominal lymphadenopathy, new. Progressive mediastinal and retroperitoneal lymphadenopathy. These are poorly evaluated but favor progressive lymphoma. Status post left hemicolectomy. No findings specific for recurrent or metastatic disease. Small bilateral pleural effusions, right greater than left. Small volume abdominopelvic ascites. Electronically Signed   By: Julian Hy M.D.   On: 02/19/2022 21:14   DG CHEST PORT 1 VIEW  Result Date: 02/19/2022 CLINICAL DATA:  Weakness.  History of CLL. EXAM: PORTABLE CHEST 1 VIEW COMPARISON:  Chest radiographs 02/09/2022 FINDINGS: The cardiomediastinal silhouette is unchanged with normal heart size. Aortic atherosclerosis is noted. No airspace consolidation, edema, pleural  effusion, or pneumothorax is identified. No acute osseous abnormality is seen. IMPRESSION: No active disease. Electronically Signed   By: Logan Bores M.D.   On: 02/19/2022 13:47   US RENAL  Result Date: 02/09/2022 CLINICAL DATA:  Acute kidney injury. EXAM: RENAL / URINARY TRACT ULTRASOUND COMPLETE COMPARISON:  None Available. FINDINGS: Right Kidney: Renal measurements: 11.11 x 4.5 x 4.0 cm = volume: 101 mL. Anechoic lesions with increased through transmission measure up to 2.5 x 1.8 x 2.6 cm. Increased parenchymal echogenicity. No hydronephrosis or solid mass. Left Kidney: Renal measurements: 10.7 x 5.0 x 4.2 cm = volume: 116 mL. Anechoic lesion with increased through transmission measures 1.8 x 1.7 x 1.6 cm. Increased parenchymal echogenicity. No hydronephrosis or solid mass. Bladder: Appears normal for degree of bladder distention. Other: None. IMPRESSION: 1. Increased renal parenchymal echogenicity, compatible with chronic medical renal disease. 2. Bilateral renal cysts. Electronically Signed   By: Lorin Picket M.D.   On: 02/09/2022 16:30   DG Abd 1 View  Result Date: 02/09/2022 CLINICAL DATA:  Nausea, vomiting. EXAM: ABDOMEN - 1 VIEW COMPARISON:  November 26, 2007. FINDINGS: The bowel gas pattern is normal. No radio-opaque calculi or other significant radiographic abnormality are seen. IMPRESSION: Negative. Electronically Signed   By: Marijo Conception M.D.   On: 02/09/2022 15:40   CT Head Wo Contrast  Result Date: 02/09/2022 CLINICAL DATA:  Weakness, head injury after fall. EXAM: CT HEAD WITHOUT CONTRAST TECHNIQUE: Contiguous axial images were obtained from the base of the skull through the vertex without intravenous contrast. RADIATION DOSE REDUCTION: This exam was performed according to the departmental dose-optimization program which includes automated exposure control, adjustment of the mA and/or kV according to patient size and/or use of iterative reconstruction technique. COMPARISON:   October 22, 2020. FINDINGS: Brain: Mild chronic ischemic white matter disease is noted. No mass effect or midline shift is noted. Ventricular size is within normal limits. There is no evidence of mass lesion, hemorrhage or acute infarction. Vascular: No hyperdense vessel or unexpected calcification. Skull: Normal.  Negative for fracture or focal lesion. Sinuses/Orbits: No acute finding. Other: None. IMPRESSION: No acute intracranial abnormality seen. Electronically Signed   By: Marijo Conception M.D.   On: 02/09/2022 11:05   DG Chest 2 View  Result Date: 02/09/2022 CLINICAL DATA:  81 year old male presenting with suspected sepsis and weakness. EXAM: CHEST - 2 VIEW COMPARISON:  October 22, 2020. FINDINGS: Cardiomediastinal contours and hilar structures are normal. Lungs are clear. Small bilateral pleural effusions. On limited assessment there is no acute skeletal process. IMPRESSION: 1. Small bilateral pleural effusions. 2. No sign of consolidation. Electronically Signed   By: Zetta Bills M.D.   On: 02/09/2022 10:02      Subjective: Patient seen and examined.  Resting comfortably on the bed.  Denies any complaints.  He is eager to go home.  No further bloody stool since last night.  Discussed with patient's wife on the phone: She also requested for the discharge.  Discharge Exam: Vitals:   03/07/22 1022 03/07/22 2111  BP: (!) 142/68 (!) 153/83  Pulse: (!) 102 (!) 102  Resp:  19  Temp:  98.7 F (37.1 C)  SpO2:  100%   Vitals:   03/07/22 0422 03/07/22 0500 03/07/22 1022 03/07/22 2111  BP: (!) 156/68  (!) 142/68 (!) 153/83  Pulse: 99  (!) 102 (!) 102  Resp: 16   19  Temp: 98.8 F (37.1 C)   98.7 F (37.1 C)  TempSrc: Oral     SpO2: 100%   100%  Weight:  66.6 kg    Height:        General: Pt is alert, awake, not in acute distress, on room air, appears weak, cachectic, on room air, communicating well Cardiovascular: RRR, S1/S2 +, no rubs, no gallops, has bilateral 2+ pitting edema  positive Respiratory: CTA bilaterally, no wheezing, no rhonchi Abdominal: Soft, NT, ND, bowel sounds + Extremities: no edema, no cyanosis    The results of significant diagnostics from this hospitalization (including imaging, microbiology, ancillary and laboratory) are listed below for reference.     Microbiology: No results found for this or any previous visit (from the past 240 hour(s)).   Labs: BNP (last 3 results) No results for input(s): "BNP" in the last 8760 hours. Basic Metabolic Panel: Recent Labs  Lab 03/03/22 0420 03/04/22 0344 03/05/22 0400 03/06/22 0408 03/07/22 0428 03/08/22 0342  NA 147* 141  141 142  142 142  142 141 141  K 4.3 3.8  3.8 4.2  4.2 4.4  4.4 4.1 4.2  CL 119* 116*  115* 116*  116* 117*  116* 118* 118*  CO2 20* 20*  20* 19*  20* 19*  19* 22 19*  GLUCOSE 144* 169*  165* 130*  130* 117*  121* 128* 131*  BUN 65* 51*  49* 41*  41* 36*  36* 30* 28*  CREATININE 2.90* 2.55*  2.47* 2.38*  2.42* 2.32*  2.29* 2.26* 2.25*  CALCIUM 8.1* 8.1*  8.1* 7.9*  7.9* 8.1*  8.1* 7.9* 7.8*  MG 2.0 1.8 1.8 1.8 1.6*  --   PHOS 3.8 3.2 3.2  3.1 2.9  2.9 2.9 2.7    Liver Function Tests: Recent Labs  Lab 03/04/22 0344 03/05/22 0400 03/06/22 0408 03/07/22 0428 03/08/22 0342  AST  --  24  --  16  --   ALT  --  30  --  26  --   ALKPHOS  --  90  --  101  --  BILITOT  --  0.6  --  0.7  --   PROT  --  4.2*  --  4.5*  --   ALBUMIN 2.6* 2.4*  2.5* 2.6* 2.5* 2.2*    No results for input(s): "LIPASE", "AMYLASE" in the last 168 hours. No results for input(s): "AMMONIA" in the last 168 hours. CBC: Recent Labs  Lab 03/02/22 0357 03/03/22 0420 03/04/22 0344 03/05/22 0400 03/06/22 0408 03/07/22 0428 03/07/22 1816 03/08/22 0342  WBC 2.2* 2.0* 2.2* 2.3* 1.7* 1.9* 1.6* 1.9*  NEUTROABS 1.1* 1.0* 1.1* 1.0*  --  0.8*  --   --   HGB 8.7* 8.0* 8.6* 7.9* 8.4* 8.6* 8.9* 8.9*  HCT 27.3* 26.0* 27.1* 24.6* 26.9* 27.4* 28.3* 28.2*  MCV 87.2 88.7  87.1 86.9 90.6 88.1 88.4 87.9  PLT 106* 104* 88* 83* 69* 64* 65* 70*    Cardiac Enzymes: No results for input(s): "CKTOTAL", "CKMB", "CKMBINDEX", "TROPONINI" in the last 168 hours. BNP: Invalid input(s): "POCBNP" CBG: Recent Labs  Lab 03/07/22 1707 03/07/22 1959 03/08/22 0011 03/08/22 0407 03/08/22 0744  GLUCAP 201* 236* 190* 125* 122*    D-Dimer No results for input(s): "DDIMER" in the last 72 hours. Hgb A1c No results for input(s): "HGBA1C" in the last 72 hours. Lipid Profile No results for input(s): "CHOL", "HDL", "LDLCALC", "TRIG", "CHOLHDL", "LDLDIRECT" in the last 72 hours. Thyroid function studies No results for input(s): "TSH", "T4TOTAL", "T3FREE", "THYROIDAB" in the last 72 hours.  Invalid input(s): "FREET3" Anemia work up No results for input(s): "VITAMINB12", "FOLATE", "FERRITIN", "TIBC", "IRON", "RETICCTPCT" in the last 72 hours. Urinalysis    Component Value Date/Time   COLORURINE YELLOW 02/19/2022 0238   APPEARANCEUR CLEAR 02/19/2022 0238   LABSPEC 1.015 02/19/2022 0238   PHURINE 5.5 02/19/2022 0238   GLUCOSEU >1,000 (A) 02/19/2022 0238   HGBUR TRACE (A) 02/19/2022 0238   BILIRUBINUR NEGATIVE 02/19/2022 0238   KETONESUR NEGATIVE 02/19/2022 0238   PROTEINUR 30 (A) 02/19/2022 0238   UROBILINOGEN 0.2 09/17/2013 0640   NITRITE NEGATIVE 02/19/2022 0238   LEUKOCYTESUR NEGATIVE 02/19/2022 0238   Sepsis Labs Recent Labs  Lab 03/06/22 0408 03/07/22 0428 03/07/22 1816 03/08/22 0342  WBC 1.7* 1.9* 1.6* 1.9*    Microbiology No results found for this or any previous visit (from the past 240 hour(s)).   Time coordinating discharge: Over 30 minutes  SIGNED:   Mckinley Jewel, MD  [facility] 03/08/2022, 11:22 AM Pager   If 7PM-7AM, please contact night-coverage [email or web address]

## 2022-03-08 NOTE — Plan of Care (Signed)
Discharge instructions reviewed with patient and wife, questions answered, verbalized understanding.  Patient transported via wheelchair to be taken home by wife.

## 2022-03-08 NOTE — TOC CM/SW Note (Signed)
[name], BSN, RN, [place], [place] [phone] RNCM spoke with pt at bedside regarding discharge planning for [name]. Offered pt [email or web address] list of home health agencies to choose from.  Pt chose [facility] to render services. [name] of [facility] notified. Patient made aware that [facility] will be in contact in 24-48 hours.  No DME needs identified at this time.

## 2022-03-09 ENCOUNTER — Telehealth: Payer: Self-pay | Admitting: *Deleted

## 2022-03-09 ENCOUNTER — Other Ambulatory Visit (HOSPITAL_COMMUNITY): Payer: Self-pay

## 2022-03-09 NOTE — Telephone Encounter (Signed)
Received PC from patient's wife, she wanted to inform this office that after speaking with Dr. Alen Blew in the hospital, she has contacted the patient's PCP about hospice care & that is being looked into by the PCP office.  She was also asking about home health care that was discussed during the patient's hospital stay, informed her that the hospital DC note states that the patient is going home with home health services, so a home health agency should be contacting them.  Instructed patient's wife to contact this office if any further resources or referrals are needed.  She verbalizes understanding.

## 2022-03-10 ENCOUNTER — Telehealth: Payer: Self-pay

## 2022-03-10 ENCOUNTER — Other Ambulatory Visit (HOSPITAL_COMMUNITY): Payer: Self-pay

## 2022-03-10 NOTE — Telephone Encounter (Signed)
Per Caryl Pina with [facility], this patient was set up with [facility] by [facility] on Saturday, therefore [facility] is unable to accept the patient. This RNCM was not advised [facility] had accepted the patient. This RNCM is signing off.

## 2022-03-11 ENCOUNTER — Telehealth: Payer: Self-pay | Admitting: *Deleted

## 2022-03-11 DIAGNOSIS — Z681 Body mass index (BMI) 19 or less, adult: Secondary | ICD-10-CM | POA: Diagnosis not present

## 2022-03-11 DIAGNOSIS — D61818 Other pancytopenia: Secondary | ICD-10-CM | POA: Diagnosis not present

## 2022-03-11 DIAGNOSIS — I129 Hypertensive chronic kidney disease with stage 1 through stage 4 chronic kidney disease, or unspecified chronic kidney disease: Secondary | ICD-10-CM | POA: Diagnosis not present

## 2022-03-11 DIAGNOSIS — Z8616 Personal history of COVID-19: Secondary | ICD-10-CM | POA: Diagnosis not present

## 2022-03-11 DIAGNOSIS — C9110 Chronic lymphocytic leukemia of B-cell type not having achieved remission: Secondary | ICD-10-CM | POA: Diagnosis not present

## 2022-03-11 DIAGNOSIS — K219 Gastro-esophageal reflux disease without esophagitis: Secondary | ICD-10-CM | POA: Diagnosis not present

## 2022-03-11 DIAGNOSIS — E785 Hyperlipidemia, unspecified: Secondary | ICD-10-CM | POA: Diagnosis not present

## 2022-03-11 DIAGNOSIS — E1122 Type 2 diabetes mellitus with diabetic chronic kidney disease: Secondary | ICD-10-CM | POA: Diagnosis not present

## 2022-03-11 DIAGNOSIS — E46 Unspecified protein-calorie malnutrition: Secondary | ICD-10-CM | POA: Diagnosis not present

## 2022-03-11 DIAGNOSIS — N184 Chronic kidney disease, stage 4 (severe): Secondary | ICD-10-CM | POA: Diagnosis not present

## 2022-03-11 DIAGNOSIS — R197 Diarrhea, unspecified: Secondary | ICD-10-CM | POA: Diagnosis not present

## 2022-03-11 NOTE — Telephone Encounter (Signed)
Notified of message below

## 2022-03-11 NOTE — Telephone Encounter (Signed)
[name] left a message stating the discharge MD told her that [name] was to follow up with Dr Alen Blew 1 week after discharge. Also states Dr Mariea Clonts has referred him to Hospice. Please advise.

## 2022-03-12 DIAGNOSIS — E1122 Type 2 diabetes mellitus with diabetic chronic kidney disease: Secondary | ICD-10-CM | POA: Diagnosis not present

## 2022-03-12 DIAGNOSIS — I129 Hypertensive chronic kidney disease with stage 1 through stage 4 chronic kidney disease, or unspecified chronic kidney disease: Secondary | ICD-10-CM | POA: Diagnosis not present

## 2022-03-12 DIAGNOSIS — R197 Diarrhea, unspecified: Secondary | ICD-10-CM | POA: Diagnosis not present

## 2022-03-12 DIAGNOSIS — E46 Unspecified protein-calorie malnutrition: Secondary | ICD-10-CM | POA: Diagnosis not present

## 2022-03-12 DIAGNOSIS — N184 Chronic kidney disease, stage 4 (severe): Secondary | ICD-10-CM | POA: Diagnosis not present

## 2022-03-12 DIAGNOSIS — C9110 Chronic lymphocytic leukemia of B-cell type not having achieved remission: Secondary | ICD-10-CM | POA: Diagnosis not present

## 2022-03-13 ENCOUNTER — Telehealth: Payer: Self-pay | Admitting: Oncology

## 2022-03-13 DIAGNOSIS — E1122 Type 2 diabetes mellitus with diabetic chronic kidney disease: Secondary | ICD-10-CM | POA: Diagnosis not present

## 2022-03-13 DIAGNOSIS — I129 Hypertensive chronic kidney disease with stage 1 through stage 4 chronic kidney disease, or unspecified chronic kidney disease: Secondary | ICD-10-CM | POA: Diagnosis not present

## 2022-03-13 DIAGNOSIS — N184 Chronic kidney disease, stage 4 (severe): Secondary | ICD-10-CM | POA: Diagnosis not present

## 2022-03-13 DIAGNOSIS — C9110 Chronic lymphocytic leukemia of B-cell type not having achieved remission: Secondary | ICD-10-CM | POA: Diagnosis not present

## 2022-03-13 DIAGNOSIS — R197 Diarrhea, unspecified: Secondary | ICD-10-CM | POA: Diagnosis not present

## 2022-03-13 DIAGNOSIS — E46 Unspecified protein-calorie malnutrition: Secondary | ICD-10-CM | POA: Diagnosis not present

## 2022-03-13 NOTE — Telephone Encounter (Signed)
.  Called patient to schedule appointment per 6/16 inbasket, patient is aware of date and time.

## 2022-03-16 DIAGNOSIS — R197 Diarrhea, unspecified: Secondary | ICD-10-CM | POA: Diagnosis not present

## 2022-03-16 DIAGNOSIS — N184 Chronic kidney disease, stage 4 (severe): Secondary | ICD-10-CM | POA: Diagnosis not present

## 2022-03-16 DIAGNOSIS — I129 Hypertensive chronic kidney disease with stage 1 through stage 4 chronic kidney disease, or unspecified chronic kidney disease: Secondary | ICD-10-CM | POA: Diagnosis not present

## 2022-03-16 DIAGNOSIS — E46 Unspecified protein-calorie malnutrition: Secondary | ICD-10-CM | POA: Diagnosis not present

## 2022-03-16 DIAGNOSIS — E1122 Type 2 diabetes mellitus with diabetic chronic kidney disease: Secondary | ICD-10-CM | POA: Diagnosis not present

## 2022-03-16 DIAGNOSIS — C9110 Chronic lymphocytic leukemia of B-cell type not having achieved remission: Secondary | ICD-10-CM | POA: Diagnosis not present

## 2022-03-18 DIAGNOSIS — E46 Unspecified protein-calorie malnutrition: Secondary | ICD-10-CM | POA: Diagnosis not present

## 2022-03-18 DIAGNOSIS — R197 Diarrhea, unspecified: Secondary | ICD-10-CM | POA: Diagnosis not present

## 2022-03-18 DIAGNOSIS — E1122 Type 2 diabetes mellitus with diabetic chronic kidney disease: Secondary | ICD-10-CM | POA: Diagnosis not present

## 2022-03-18 DIAGNOSIS — C9110 Chronic lymphocytic leukemia of B-cell type not having achieved remission: Secondary | ICD-10-CM | POA: Diagnosis not present

## 2022-03-18 DIAGNOSIS — I129 Hypertensive chronic kidney disease with stage 1 through stage 4 chronic kidney disease, or unspecified chronic kidney disease: Secondary | ICD-10-CM | POA: Diagnosis not present

## 2022-03-18 DIAGNOSIS — N184 Chronic kidney disease, stage 4 (severe): Secondary | ICD-10-CM | POA: Diagnosis not present

## 2022-03-18 DIAGNOSIS — A0472 Enterocolitis due to Clostridium difficile, not specified as recurrent: Secondary | ICD-10-CM | POA: Diagnosis not present

## 2022-03-20 ENCOUNTER — Inpatient Hospital Stay: Payer: Medicare Other | Admitting: Oncology

## 2022-03-20 ENCOUNTER — Inpatient Hospital Stay: Payer: Medicare Other

## 2022-03-20 DIAGNOSIS — E46 Unspecified protein-calorie malnutrition: Secondary | ICD-10-CM | POA: Diagnosis not present

## 2022-03-20 DIAGNOSIS — C9110 Chronic lymphocytic leukemia of B-cell type not having achieved remission: Secondary | ICD-10-CM | POA: Diagnosis not present

## 2022-03-20 DIAGNOSIS — N184 Chronic kidney disease, stage 4 (severe): Secondary | ICD-10-CM | POA: Diagnosis not present

## 2022-03-20 DIAGNOSIS — I129 Hypertensive chronic kidney disease with stage 1 through stage 4 chronic kidney disease, or unspecified chronic kidney disease: Secondary | ICD-10-CM | POA: Diagnosis not present

## 2022-03-20 DIAGNOSIS — E1122 Type 2 diabetes mellitus with diabetic chronic kidney disease: Secondary | ICD-10-CM | POA: Diagnosis not present

## 2022-03-20 DIAGNOSIS — R197 Diarrhea, unspecified: Secondary | ICD-10-CM | POA: Diagnosis not present

## 2022-03-21 DIAGNOSIS — I129 Hypertensive chronic kidney disease with stage 1 through stage 4 chronic kidney disease, or unspecified chronic kidney disease: Secondary | ICD-10-CM | POA: Diagnosis not present

## 2022-03-21 DIAGNOSIS — E1122 Type 2 diabetes mellitus with diabetic chronic kidney disease: Secondary | ICD-10-CM | POA: Diagnosis not present

## 2022-03-21 DIAGNOSIS — R197 Diarrhea, unspecified: Secondary | ICD-10-CM | POA: Diagnosis not present

## 2022-03-21 DIAGNOSIS — E46 Unspecified protein-calorie malnutrition: Secondary | ICD-10-CM | POA: Diagnosis not present

## 2022-03-21 DIAGNOSIS — C9110 Chronic lymphocytic leukemia of B-cell type not having achieved remission: Secondary | ICD-10-CM | POA: Diagnosis not present

## 2022-03-21 DIAGNOSIS — N184 Chronic kidney disease, stage 4 (severe): Secondary | ICD-10-CM | POA: Diagnosis not present

## 2022-03-22 DIAGNOSIS — R197 Diarrhea, unspecified: Secondary | ICD-10-CM | POA: Diagnosis not present

## 2022-03-22 DIAGNOSIS — C9110 Chronic lymphocytic leukemia of B-cell type not having achieved remission: Secondary | ICD-10-CM | POA: Diagnosis not present

## 2022-03-22 DIAGNOSIS — E1122 Type 2 diabetes mellitus with diabetic chronic kidney disease: Secondary | ICD-10-CM | POA: Diagnosis not present

## 2022-03-22 DIAGNOSIS — I129 Hypertensive chronic kidney disease with stage 1 through stage 4 chronic kidney disease, or unspecified chronic kidney disease: Secondary | ICD-10-CM | POA: Diagnosis not present

## 2022-03-22 DIAGNOSIS — E46 Unspecified protein-calorie malnutrition: Secondary | ICD-10-CM | POA: Diagnosis not present

## 2022-03-22 DIAGNOSIS — N184 Chronic kidney disease, stage 4 (severe): Secondary | ICD-10-CM | POA: Diagnosis not present

## 2022-03-23 DIAGNOSIS — I129 Hypertensive chronic kidney disease with stage 1 through stage 4 chronic kidney disease, or unspecified chronic kidney disease: Secondary | ICD-10-CM | POA: Diagnosis not present

## 2022-03-23 DIAGNOSIS — C9110 Chronic lymphocytic leukemia of B-cell type not having achieved remission: Secondary | ICD-10-CM | POA: Diagnosis not present

## 2022-03-23 DIAGNOSIS — N184 Chronic kidney disease, stage 4 (severe): Secondary | ICD-10-CM | POA: Diagnosis not present

## 2022-03-23 DIAGNOSIS — E1122 Type 2 diabetes mellitus with diabetic chronic kidney disease: Secondary | ICD-10-CM | POA: Diagnosis not present

## 2022-03-23 DIAGNOSIS — R197 Diarrhea, unspecified: Secondary | ICD-10-CM | POA: Diagnosis not present

## 2022-03-23 DIAGNOSIS — E46 Unspecified protein-calorie malnutrition: Secondary | ICD-10-CM | POA: Diagnosis not present

## 2022-03-25 DIAGNOSIS — C9110 Chronic lymphocytic leukemia of B-cell type not having achieved remission: Secondary | ICD-10-CM | POA: Diagnosis not present

## 2022-03-25 DIAGNOSIS — E46 Unspecified protein-calorie malnutrition: Secondary | ICD-10-CM | POA: Diagnosis not present

## 2022-03-25 DIAGNOSIS — N184 Chronic kidney disease, stage 4 (severe): Secondary | ICD-10-CM | POA: Diagnosis not present

## 2022-03-25 DIAGNOSIS — E1122 Type 2 diabetes mellitus with diabetic chronic kidney disease: Secondary | ICD-10-CM | POA: Diagnosis not present

## 2022-03-25 DIAGNOSIS — R197 Diarrhea, unspecified: Secondary | ICD-10-CM | POA: Diagnosis not present

## 2022-03-25 DIAGNOSIS — I129 Hypertensive chronic kidney disease with stage 1 through stage 4 chronic kidney disease, or unspecified chronic kidney disease: Secondary | ICD-10-CM | POA: Diagnosis not present

## 2022-03-26 DIAGNOSIS — R197 Diarrhea, unspecified: Secondary | ICD-10-CM | POA: Diagnosis not present

## 2022-03-26 DIAGNOSIS — I129 Hypertensive chronic kidney disease with stage 1 through stage 4 chronic kidney disease, or unspecified chronic kidney disease: Secondary | ICD-10-CM | POA: Diagnosis not present

## 2022-03-26 DIAGNOSIS — E46 Unspecified protein-calorie malnutrition: Secondary | ICD-10-CM | POA: Diagnosis not present

## 2022-03-26 DIAGNOSIS — N184 Chronic kidney disease, stage 4 (severe): Secondary | ICD-10-CM | POA: Diagnosis not present

## 2022-03-26 DIAGNOSIS — C9110 Chronic lymphocytic leukemia of B-cell type not having achieved remission: Secondary | ICD-10-CM | POA: Diagnosis not present

## 2022-03-26 DIAGNOSIS — E1122 Type 2 diabetes mellitus with diabetic chronic kidney disease: Secondary | ICD-10-CM | POA: Diagnosis not present

## 2022-03-27 DIAGNOSIS — R197 Diarrhea, unspecified: Secondary | ICD-10-CM | POA: Diagnosis not present

## 2022-03-27 DIAGNOSIS — I129 Hypertensive chronic kidney disease with stage 1 through stage 4 chronic kidney disease, or unspecified chronic kidney disease: Secondary | ICD-10-CM | POA: Diagnosis not present

## 2022-03-27 DIAGNOSIS — E1122 Type 2 diabetes mellitus with diabetic chronic kidney disease: Secondary | ICD-10-CM | POA: Diagnosis not present

## 2022-03-27 DIAGNOSIS — N184 Chronic kidney disease, stage 4 (severe): Secondary | ICD-10-CM | POA: Diagnosis not present

## 2022-03-27 DIAGNOSIS — C9110 Chronic lymphocytic leukemia of B-cell type not having achieved remission: Secondary | ICD-10-CM | POA: Diagnosis not present

## 2022-03-27 DIAGNOSIS — E46 Unspecified protein-calorie malnutrition: Secondary | ICD-10-CM | POA: Diagnosis not present

## 2022-03-28 DIAGNOSIS — K219 Gastro-esophageal reflux disease without esophagitis: Secondary | ICD-10-CM | POA: Diagnosis not present

## 2022-03-28 DIAGNOSIS — D61818 Other pancytopenia: Secondary | ICD-10-CM | POA: Diagnosis not present

## 2022-03-28 DIAGNOSIS — N184 Chronic kidney disease, stage 4 (severe): Secondary | ICD-10-CM | POA: Diagnosis not present

## 2022-03-28 DIAGNOSIS — E1122 Type 2 diabetes mellitus with diabetic chronic kidney disease: Secondary | ICD-10-CM | POA: Diagnosis not present

## 2022-03-28 DIAGNOSIS — E46 Unspecified protein-calorie malnutrition: Secondary | ICD-10-CM | POA: Diagnosis not present

## 2022-03-28 DIAGNOSIS — I129 Hypertensive chronic kidney disease with stage 1 through stage 4 chronic kidney disease, or unspecified chronic kidney disease: Secondary | ICD-10-CM | POA: Diagnosis not present

## 2022-03-28 DIAGNOSIS — C9110 Chronic lymphocytic leukemia of B-cell type not having achieved remission: Secondary | ICD-10-CM | POA: Diagnosis not present

## 2022-03-28 DIAGNOSIS — Z8616 Personal history of COVID-19: Secondary | ICD-10-CM | POA: Diagnosis not present

## 2022-03-28 DIAGNOSIS — Z681 Body mass index (BMI) 19 or less, adult: Secondary | ICD-10-CM | POA: Diagnosis not present

## 2022-03-28 DIAGNOSIS — E785 Hyperlipidemia, unspecified: Secondary | ICD-10-CM | POA: Diagnosis not present

## 2022-03-28 DIAGNOSIS — R197 Diarrhea, unspecified: Secondary | ICD-10-CM | POA: Diagnosis not present

## 2022-03-29 DIAGNOSIS — I129 Hypertensive chronic kidney disease with stage 1 through stage 4 chronic kidney disease, or unspecified chronic kidney disease: Secondary | ICD-10-CM | POA: Diagnosis not present

## 2022-03-29 DIAGNOSIS — R197 Diarrhea, unspecified: Secondary | ICD-10-CM | POA: Diagnosis not present

## 2022-03-29 DIAGNOSIS — E1122 Type 2 diabetes mellitus with diabetic chronic kidney disease: Secondary | ICD-10-CM | POA: Diagnosis not present

## 2022-03-29 DIAGNOSIS — E46 Unspecified protein-calorie malnutrition: Secondary | ICD-10-CM | POA: Diagnosis not present

## 2022-03-29 DIAGNOSIS — N184 Chronic kidney disease, stage 4 (severe): Secondary | ICD-10-CM | POA: Diagnosis not present

## 2022-03-29 DIAGNOSIS — C9110 Chronic lymphocytic leukemia of B-cell type not having achieved remission: Secondary | ICD-10-CM | POA: Diagnosis not present

## 2022-03-30 DIAGNOSIS — R197 Diarrhea, unspecified: Secondary | ICD-10-CM | POA: Diagnosis not present

## 2022-03-30 DIAGNOSIS — E46 Unspecified protein-calorie malnutrition: Secondary | ICD-10-CM | POA: Diagnosis not present

## 2022-03-30 DIAGNOSIS — C9110 Chronic lymphocytic leukemia of B-cell type not having achieved remission: Secondary | ICD-10-CM | POA: Diagnosis not present

## 2022-03-30 DIAGNOSIS — E1122 Type 2 diabetes mellitus with diabetic chronic kidney disease: Secondary | ICD-10-CM | POA: Diagnosis not present

## 2022-03-30 DIAGNOSIS — N184 Chronic kidney disease, stage 4 (severe): Secondary | ICD-10-CM | POA: Diagnosis not present

## 2022-03-30 DIAGNOSIS — I129 Hypertensive chronic kidney disease with stage 1 through stage 4 chronic kidney disease, or unspecified chronic kidney disease: Secondary | ICD-10-CM | POA: Diagnosis not present

## 2022-03-31 DIAGNOSIS — E1122 Type 2 diabetes mellitus with diabetic chronic kidney disease: Secondary | ICD-10-CM | POA: Diagnosis not present

## 2022-03-31 DIAGNOSIS — E46 Unspecified protein-calorie malnutrition: Secondary | ICD-10-CM | POA: Diagnosis not present

## 2022-03-31 DIAGNOSIS — N184 Chronic kidney disease, stage 4 (severe): Secondary | ICD-10-CM | POA: Diagnosis not present

## 2022-03-31 DIAGNOSIS — C9110 Chronic lymphocytic leukemia of B-cell type not having achieved remission: Secondary | ICD-10-CM | POA: Diagnosis not present

## 2022-03-31 DIAGNOSIS — I129 Hypertensive chronic kidney disease with stage 1 through stage 4 chronic kidney disease, or unspecified chronic kidney disease: Secondary | ICD-10-CM | POA: Diagnosis not present

## 2022-03-31 DIAGNOSIS — R197 Diarrhea, unspecified: Secondary | ICD-10-CM | POA: Diagnosis not present

## 2022-04-01 DIAGNOSIS — I129 Hypertensive chronic kidney disease with stage 1 through stage 4 chronic kidney disease, or unspecified chronic kidney disease: Secondary | ICD-10-CM | POA: Diagnosis not present

## 2022-04-01 DIAGNOSIS — N184 Chronic kidney disease, stage 4 (severe): Secondary | ICD-10-CM | POA: Diagnosis not present

## 2022-04-01 DIAGNOSIS — E1122 Type 2 diabetes mellitus with diabetic chronic kidney disease: Secondary | ICD-10-CM | POA: Diagnosis not present

## 2022-04-01 DIAGNOSIS — E46 Unspecified protein-calorie malnutrition: Secondary | ICD-10-CM | POA: Diagnosis not present

## 2022-04-01 DIAGNOSIS — R197 Diarrhea, unspecified: Secondary | ICD-10-CM | POA: Diagnosis not present

## 2022-04-01 DIAGNOSIS — C9110 Chronic lymphocytic leukemia of B-cell type not having achieved remission: Secondary | ICD-10-CM | POA: Diagnosis not present

## 2022-04-02 ENCOUNTER — Inpatient Hospital Stay: Payer: Medicare Other | Admitting: Internal Medicine

## 2022-04-02 DIAGNOSIS — C9110 Chronic lymphocytic leukemia of B-cell type not having achieved remission: Secondary | ICD-10-CM | POA: Diagnosis not present

## 2022-04-02 DIAGNOSIS — E1122 Type 2 diabetes mellitus with diabetic chronic kidney disease: Secondary | ICD-10-CM | POA: Diagnosis not present

## 2022-04-02 DIAGNOSIS — N184 Chronic kidney disease, stage 4 (severe): Secondary | ICD-10-CM | POA: Diagnosis not present

## 2022-04-02 DIAGNOSIS — R197 Diarrhea, unspecified: Secondary | ICD-10-CM | POA: Diagnosis not present

## 2022-04-02 DIAGNOSIS — E46 Unspecified protein-calorie malnutrition: Secondary | ICD-10-CM | POA: Diagnosis not present

## 2022-04-02 DIAGNOSIS — I129 Hypertensive chronic kidney disease with stage 1 through stage 4 chronic kidney disease, or unspecified chronic kidney disease: Secondary | ICD-10-CM | POA: Diagnosis not present

## 2022-04-03 DIAGNOSIS — C9110 Chronic lymphocytic leukemia of B-cell type not having achieved remission: Secondary | ICD-10-CM | POA: Diagnosis not present

## 2022-04-03 DIAGNOSIS — N184 Chronic kidney disease, stage 4 (severe): Secondary | ICD-10-CM | POA: Diagnosis not present

## 2022-04-03 DIAGNOSIS — E1122 Type 2 diabetes mellitus with diabetic chronic kidney disease: Secondary | ICD-10-CM | POA: Diagnosis not present

## 2022-04-03 DIAGNOSIS — R197 Diarrhea, unspecified: Secondary | ICD-10-CM | POA: Diagnosis not present

## 2022-04-03 DIAGNOSIS — E46 Unspecified protein-calorie malnutrition: Secondary | ICD-10-CM | POA: Diagnosis not present

## 2022-04-03 DIAGNOSIS — I129 Hypertensive chronic kidney disease with stage 1 through stage 4 chronic kidney disease, or unspecified chronic kidney disease: Secondary | ICD-10-CM | POA: Diagnosis not present

## 2022-04-04 DIAGNOSIS — N184 Chronic kidney disease, stage 4 (severe): Secondary | ICD-10-CM | POA: Diagnosis not present

## 2022-04-04 DIAGNOSIS — R197 Diarrhea, unspecified: Secondary | ICD-10-CM | POA: Diagnosis not present

## 2022-04-04 DIAGNOSIS — C9110 Chronic lymphocytic leukemia of B-cell type not having achieved remission: Secondary | ICD-10-CM | POA: Diagnosis not present

## 2022-04-04 DIAGNOSIS — I129 Hypertensive chronic kidney disease with stage 1 through stage 4 chronic kidney disease, or unspecified chronic kidney disease: Secondary | ICD-10-CM | POA: Diagnosis not present

## 2022-04-04 DIAGNOSIS — E1122 Type 2 diabetes mellitus with diabetic chronic kidney disease: Secondary | ICD-10-CM | POA: Diagnosis not present

## 2022-04-04 DIAGNOSIS — E46 Unspecified protein-calorie malnutrition: Secondary | ICD-10-CM | POA: Diagnosis not present

## 2022-04-05 DIAGNOSIS — E46 Unspecified protein-calorie malnutrition: Secondary | ICD-10-CM | POA: Diagnosis not present

## 2022-04-05 DIAGNOSIS — E1122 Type 2 diabetes mellitus with diabetic chronic kidney disease: Secondary | ICD-10-CM | POA: Diagnosis not present

## 2022-04-05 DIAGNOSIS — C9110 Chronic lymphocytic leukemia of B-cell type not having achieved remission: Secondary | ICD-10-CM | POA: Diagnosis not present

## 2022-04-05 DIAGNOSIS — I129 Hypertensive chronic kidney disease with stage 1 through stage 4 chronic kidney disease, or unspecified chronic kidney disease: Secondary | ICD-10-CM | POA: Diagnosis not present

## 2022-04-05 DIAGNOSIS — R197 Diarrhea, unspecified: Secondary | ICD-10-CM | POA: Diagnosis not present

## 2022-04-05 DIAGNOSIS — N184 Chronic kidney disease, stage 4 (severe): Secondary | ICD-10-CM | POA: Diagnosis not present

## 2022-04-06 DIAGNOSIS — I129 Hypertensive chronic kidney disease with stage 1 through stage 4 chronic kidney disease, or unspecified chronic kidney disease: Secondary | ICD-10-CM | POA: Diagnosis not present

## 2022-04-06 DIAGNOSIS — R197 Diarrhea, unspecified: Secondary | ICD-10-CM | POA: Diagnosis not present

## 2022-04-06 DIAGNOSIS — E46 Unspecified protein-calorie malnutrition: Secondary | ICD-10-CM | POA: Diagnosis not present

## 2022-04-06 DIAGNOSIS — N184 Chronic kidney disease, stage 4 (severe): Secondary | ICD-10-CM | POA: Diagnosis not present

## 2022-04-06 DIAGNOSIS — E1122 Type 2 diabetes mellitus with diabetic chronic kidney disease: Secondary | ICD-10-CM | POA: Diagnosis not present

## 2022-04-06 DIAGNOSIS — C9110 Chronic lymphocytic leukemia of B-cell type not having achieved remission: Secondary | ICD-10-CM | POA: Diagnosis not present

## 2022-04-07 DIAGNOSIS — C9110 Chronic lymphocytic leukemia of B-cell type not having achieved remission: Secondary | ICD-10-CM | POA: Diagnosis not present

## 2022-04-07 DIAGNOSIS — E1122 Type 2 diabetes mellitus with diabetic chronic kidney disease: Secondary | ICD-10-CM | POA: Diagnosis not present

## 2022-04-07 DIAGNOSIS — I129 Hypertensive chronic kidney disease with stage 1 through stage 4 chronic kidney disease, or unspecified chronic kidney disease: Secondary | ICD-10-CM | POA: Diagnosis not present

## 2022-04-07 DIAGNOSIS — R197 Diarrhea, unspecified: Secondary | ICD-10-CM | POA: Diagnosis not present

## 2022-04-07 DIAGNOSIS — N184 Chronic kidney disease, stage 4 (severe): Secondary | ICD-10-CM | POA: Diagnosis not present

## 2022-04-07 DIAGNOSIS — E46 Unspecified protein-calorie malnutrition: Secondary | ICD-10-CM | POA: Diagnosis not present

## 2022-04-08 DIAGNOSIS — N184 Chronic kidney disease, stage 4 (severe): Secondary | ICD-10-CM | POA: Diagnosis not present

## 2022-04-08 DIAGNOSIS — I129 Hypertensive chronic kidney disease with stage 1 through stage 4 chronic kidney disease, or unspecified chronic kidney disease: Secondary | ICD-10-CM | POA: Diagnosis not present

## 2022-04-08 DIAGNOSIS — E1122 Type 2 diabetes mellitus with diabetic chronic kidney disease: Secondary | ICD-10-CM | POA: Diagnosis not present

## 2022-04-08 DIAGNOSIS — C9110 Chronic lymphocytic leukemia of B-cell type not having achieved remission: Secondary | ICD-10-CM | POA: Diagnosis not present

## 2022-04-08 DIAGNOSIS — E46 Unspecified protein-calorie malnutrition: Secondary | ICD-10-CM | POA: Diagnosis not present

## 2022-04-08 DIAGNOSIS — R197 Diarrhea, unspecified: Secondary | ICD-10-CM | POA: Diagnosis not present

## 2022-04-09 DIAGNOSIS — N184 Chronic kidney disease, stage 4 (severe): Secondary | ICD-10-CM | POA: Diagnosis not present

## 2022-04-09 DIAGNOSIS — E1122 Type 2 diabetes mellitus with diabetic chronic kidney disease: Secondary | ICD-10-CM | POA: Diagnosis not present

## 2022-04-09 DIAGNOSIS — E46 Unspecified protein-calorie malnutrition: Secondary | ICD-10-CM | POA: Diagnosis not present

## 2022-04-09 DIAGNOSIS — I129 Hypertensive chronic kidney disease with stage 1 through stage 4 chronic kidney disease, or unspecified chronic kidney disease: Secondary | ICD-10-CM | POA: Diagnosis not present

## 2022-04-09 DIAGNOSIS — C9110 Chronic lymphocytic leukemia of B-cell type not having achieved remission: Secondary | ICD-10-CM | POA: Diagnosis not present

## 2022-04-09 DIAGNOSIS — R197 Diarrhea, unspecified: Secondary | ICD-10-CM | POA: Diagnosis not present

## 2022-04-10 DIAGNOSIS — I129 Hypertensive chronic kidney disease with stage 1 through stage 4 chronic kidney disease, or unspecified chronic kidney disease: Secondary | ICD-10-CM | POA: Diagnosis not present

## 2022-04-10 DIAGNOSIS — N184 Chronic kidney disease, stage 4 (severe): Secondary | ICD-10-CM | POA: Diagnosis not present

## 2022-04-10 DIAGNOSIS — C9110 Chronic lymphocytic leukemia of B-cell type not having achieved remission: Secondary | ICD-10-CM | POA: Diagnosis not present

## 2022-04-10 DIAGNOSIS — E1122 Type 2 diabetes mellitus with diabetic chronic kidney disease: Secondary | ICD-10-CM | POA: Diagnosis not present

## 2022-04-10 DIAGNOSIS — E46 Unspecified protein-calorie malnutrition: Secondary | ICD-10-CM | POA: Diagnosis not present

## 2022-04-10 DIAGNOSIS — R197 Diarrhea, unspecified: Secondary | ICD-10-CM | POA: Diagnosis not present

## 2022-04-11 DIAGNOSIS — R197 Diarrhea, unspecified: Secondary | ICD-10-CM | POA: Diagnosis not present

## 2022-04-11 DIAGNOSIS — C9110 Chronic lymphocytic leukemia of B-cell type not having achieved remission: Secondary | ICD-10-CM | POA: Diagnosis not present

## 2022-04-11 DIAGNOSIS — I129 Hypertensive chronic kidney disease with stage 1 through stage 4 chronic kidney disease, or unspecified chronic kidney disease: Secondary | ICD-10-CM | POA: Diagnosis not present

## 2022-04-11 DIAGNOSIS — E1122 Type 2 diabetes mellitus with diabetic chronic kidney disease: Secondary | ICD-10-CM | POA: Diagnosis not present

## 2022-04-11 DIAGNOSIS — N184 Chronic kidney disease, stage 4 (severe): Secondary | ICD-10-CM | POA: Diagnosis not present

## 2022-04-11 DIAGNOSIS — E46 Unspecified protein-calorie malnutrition: Secondary | ICD-10-CM | POA: Diagnosis not present

## 2022-04-12 DIAGNOSIS — C9110 Chronic lymphocytic leukemia of B-cell type not having achieved remission: Secondary | ICD-10-CM | POA: Diagnosis not present

## 2022-04-12 DIAGNOSIS — I129 Hypertensive chronic kidney disease with stage 1 through stage 4 chronic kidney disease, or unspecified chronic kidney disease: Secondary | ICD-10-CM | POA: Diagnosis not present

## 2022-04-12 DIAGNOSIS — N184 Chronic kidney disease, stage 4 (severe): Secondary | ICD-10-CM | POA: Diagnosis not present

## 2022-04-12 DIAGNOSIS — E1122 Type 2 diabetes mellitus with diabetic chronic kidney disease: Secondary | ICD-10-CM | POA: Diagnosis not present

## 2022-04-12 DIAGNOSIS — R197 Diarrhea, unspecified: Secondary | ICD-10-CM | POA: Diagnosis not present

## 2022-04-12 DIAGNOSIS — E46 Unspecified protein-calorie malnutrition: Secondary | ICD-10-CM | POA: Diagnosis not present

## 2022-04-13 DIAGNOSIS — E1122 Type 2 diabetes mellitus with diabetic chronic kidney disease: Secondary | ICD-10-CM | POA: Diagnosis not present

## 2022-04-13 DIAGNOSIS — I129 Hypertensive chronic kidney disease with stage 1 through stage 4 chronic kidney disease, or unspecified chronic kidney disease: Secondary | ICD-10-CM | POA: Diagnosis not present

## 2022-04-13 DIAGNOSIS — C9110 Chronic lymphocytic leukemia of B-cell type not having achieved remission: Secondary | ICD-10-CM | POA: Diagnosis not present

## 2022-04-13 DIAGNOSIS — E46 Unspecified protein-calorie malnutrition: Secondary | ICD-10-CM | POA: Diagnosis not present

## 2022-04-13 DIAGNOSIS — N184 Chronic kidney disease, stage 4 (severe): Secondary | ICD-10-CM | POA: Diagnosis not present

## 2022-04-13 DIAGNOSIS — R197 Diarrhea, unspecified: Secondary | ICD-10-CM | POA: Diagnosis not present

## 2022-04-14 DIAGNOSIS — C9110 Chronic lymphocytic leukemia of B-cell type not having achieved remission: Secondary | ICD-10-CM | POA: Diagnosis not present

## 2022-04-14 DIAGNOSIS — E46 Unspecified protein-calorie malnutrition: Secondary | ICD-10-CM | POA: Diagnosis not present

## 2022-04-14 DIAGNOSIS — R197 Diarrhea, unspecified: Secondary | ICD-10-CM | POA: Diagnosis not present

## 2022-04-14 DIAGNOSIS — N184 Chronic kidney disease, stage 4 (severe): Secondary | ICD-10-CM | POA: Diagnosis not present

## 2022-04-14 DIAGNOSIS — I129 Hypertensive chronic kidney disease with stage 1 through stage 4 chronic kidney disease, or unspecified chronic kidney disease: Secondary | ICD-10-CM | POA: Diagnosis not present

## 2022-04-14 DIAGNOSIS — E1122 Type 2 diabetes mellitus with diabetic chronic kidney disease: Secondary | ICD-10-CM | POA: Diagnosis not present

## 2022-04-15 DIAGNOSIS — C9110 Chronic lymphocytic leukemia of B-cell type not having achieved remission: Secondary | ICD-10-CM | POA: Diagnosis not present

## 2022-04-15 DIAGNOSIS — E46 Unspecified protein-calorie malnutrition: Secondary | ICD-10-CM | POA: Diagnosis not present

## 2022-04-15 DIAGNOSIS — R197 Diarrhea, unspecified: Secondary | ICD-10-CM | POA: Diagnosis not present

## 2022-04-15 DIAGNOSIS — I129 Hypertensive chronic kidney disease with stage 1 through stage 4 chronic kidney disease, or unspecified chronic kidney disease: Secondary | ICD-10-CM | POA: Diagnosis not present

## 2022-04-15 DIAGNOSIS — N184 Chronic kidney disease, stage 4 (severe): Secondary | ICD-10-CM | POA: Diagnosis not present

## 2022-04-15 DIAGNOSIS — E1122 Type 2 diabetes mellitus with diabetic chronic kidney disease: Secondary | ICD-10-CM | POA: Diagnosis not present

## 2022-04-16 DIAGNOSIS — C9110 Chronic lymphocytic leukemia of B-cell type not having achieved remission: Secondary | ICD-10-CM | POA: Diagnosis not present

## 2022-04-16 DIAGNOSIS — I129 Hypertensive chronic kidney disease with stage 1 through stage 4 chronic kidney disease, or unspecified chronic kidney disease: Secondary | ICD-10-CM | POA: Diagnosis not present

## 2022-04-16 DIAGNOSIS — N184 Chronic kidney disease, stage 4 (severe): Secondary | ICD-10-CM | POA: Diagnosis not present

## 2022-04-16 DIAGNOSIS — E46 Unspecified protein-calorie malnutrition: Secondary | ICD-10-CM | POA: Diagnosis not present

## 2022-04-16 DIAGNOSIS — R197 Diarrhea, unspecified: Secondary | ICD-10-CM | POA: Diagnosis not present

## 2022-04-16 DIAGNOSIS — E1122 Type 2 diabetes mellitus with diabetic chronic kidney disease: Secondary | ICD-10-CM | POA: Diagnosis not present

## 2022-04-17 DIAGNOSIS — E1122 Type 2 diabetes mellitus with diabetic chronic kidney disease: Secondary | ICD-10-CM | POA: Diagnosis not present

## 2022-04-17 DIAGNOSIS — I129 Hypertensive chronic kidney disease with stage 1 through stage 4 chronic kidney disease, or unspecified chronic kidney disease: Secondary | ICD-10-CM | POA: Diagnosis not present

## 2022-04-17 DIAGNOSIS — R197 Diarrhea, unspecified: Secondary | ICD-10-CM | POA: Diagnosis not present

## 2022-04-17 DIAGNOSIS — N184 Chronic kidney disease, stage 4 (severe): Secondary | ICD-10-CM | POA: Diagnosis not present

## 2022-04-17 DIAGNOSIS — E46 Unspecified protein-calorie malnutrition: Secondary | ICD-10-CM | POA: Diagnosis not present

## 2022-04-17 DIAGNOSIS — C9110 Chronic lymphocytic leukemia of B-cell type not having achieved remission: Secondary | ICD-10-CM | POA: Diagnosis not present

## 2022-04-18 DIAGNOSIS — E46 Unspecified protein-calorie malnutrition: Secondary | ICD-10-CM | POA: Diagnosis not present

## 2022-04-18 DIAGNOSIS — N184 Chronic kidney disease, stage 4 (severe): Secondary | ICD-10-CM | POA: Diagnosis not present

## 2022-04-18 DIAGNOSIS — C9110 Chronic lymphocytic leukemia of B-cell type not having achieved remission: Secondary | ICD-10-CM | POA: Diagnosis not present

## 2022-04-18 DIAGNOSIS — R197 Diarrhea, unspecified: Secondary | ICD-10-CM | POA: Diagnosis not present

## 2022-04-18 DIAGNOSIS — I129 Hypertensive chronic kidney disease with stage 1 through stage 4 chronic kidney disease, or unspecified chronic kidney disease: Secondary | ICD-10-CM | POA: Diagnosis not present

## 2022-04-18 DIAGNOSIS — E1122 Type 2 diabetes mellitus with diabetic chronic kidney disease: Secondary | ICD-10-CM | POA: Diagnosis not present

## 2022-04-19 DIAGNOSIS — R197 Diarrhea, unspecified: Secondary | ICD-10-CM | POA: Diagnosis not present

## 2022-04-19 DIAGNOSIS — E1122 Type 2 diabetes mellitus with diabetic chronic kidney disease: Secondary | ICD-10-CM | POA: Diagnosis not present

## 2022-04-19 DIAGNOSIS — E46 Unspecified protein-calorie malnutrition: Secondary | ICD-10-CM | POA: Diagnosis not present

## 2022-04-19 DIAGNOSIS — C9110 Chronic lymphocytic leukemia of B-cell type not having achieved remission: Secondary | ICD-10-CM | POA: Diagnosis not present

## 2022-04-19 DIAGNOSIS — N184 Chronic kidney disease, stage 4 (severe): Secondary | ICD-10-CM | POA: Diagnosis not present

## 2022-04-19 DIAGNOSIS — I129 Hypertensive chronic kidney disease with stage 1 through stage 4 chronic kidney disease, or unspecified chronic kidney disease: Secondary | ICD-10-CM | POA: Diagnosis not present

## 2022-04-20 DIAGNOSIS — C9110 Chronic lymphocytic leukemia of B-cell type not having achieved remission: Secondary | ICD-10-CM | POA: Diagnosis not present

## 2022-04-20 DIAGNOSIS — R197 Diarrhea, unspecified: Secondary | ICD-10-CM | POA: Diagnosis not present

## 2022-04-20 DIAGNOSIS — E46 Unspecified protein-calorie malnutrition: Secondary | ICD-10-CM | POA: Diagnosis not present

## 2022-04-20 DIAGNOSIS — E1122 Type 2 diabetes mellitus with diabetic chronic kidney disease: Secondary | ICD-10-CM | POA: Diagnosis not present

## 2022-04-20 DIAGNOSIS — I129 Hypertensive chronic kidney disease with stage 1 through stage 4 chronic kidney disease, or unspecified chronic kidney disease: Secondary | ICD-10-CM | POA: Diagnosis not present

## 2022-04-20 DIAGNOSIS — N184 Chronic kidney disease, stage 4 (severe): Secondary | ICD-10-CM | POA: Diagnosis not present

## 2022-04-22 DIAGNOSIS — R197 Diarrhea, unspecified: Secondary | ICD-10-CM | POA: Diagnosis not present

## 2022-04-22 DIAGNOSIS — C9110 Chronic lymphocytic leukemia of B-cell type not having achieved remission: Secondary | ICD-10-CM | POA: Diagnosis not present

## 2022-04-22 DIAGNOSIS — E1122 Type 2 diabetes mellitus with diabetic chronic kidney disease: Secondary | ICD-10-CM | POA: Diagnosis not present

## 2022-04-22 DIAGNOSIS — I129 Hypertensive chronic kidney disease with stage 1 through stage 4 chronic kidney disease, or unspecified chronic kidney disease: Secondary | ICD-10-CM | POA: Diagnosis not present

## 2022-04-22 DIAGNOSIS — E46 Unspecified protein-calorie malnutrition: Secondary | ICD-10-CM | POA: Diagnosis not present

## 2022-04-22 DIAGNOSIS — N184 Chronic kidney disease, stage 4 (severe): Secondary | ICD-10-CM | POA: Diagnosis not present

## 2022-04-28 DEATH — deceased

## 2023-04-15 IMAGING — CT CT ABD-PELV W/O CM
2 of 4 series · 12 of 36 positions shown, 15 images · non-contrast
Comparison: CTA chest dated 10/22/2020. CT abdomen/pelvis dated
02/28/2020.

CLINICAL DATA: CLL. History of colon cancer. Metastatic disease
evaluation. Cough, nausea/vomiting.



[Series 3: cap w/o · axial · non-contrast · 0.74mm/px · z∈[+1137,+1687]mm · 9 of 132 slices shown, 12 images]
[im 11/132  mediastinal]
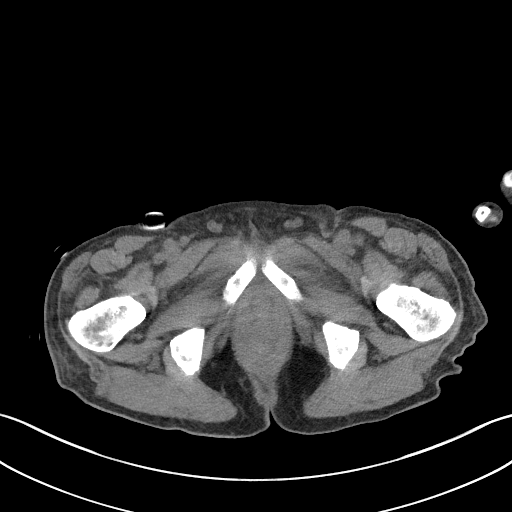
[im 11/132  lung]
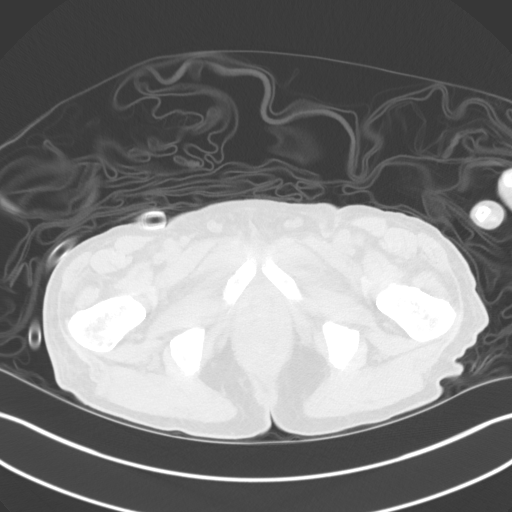
[im 22/132  lung]
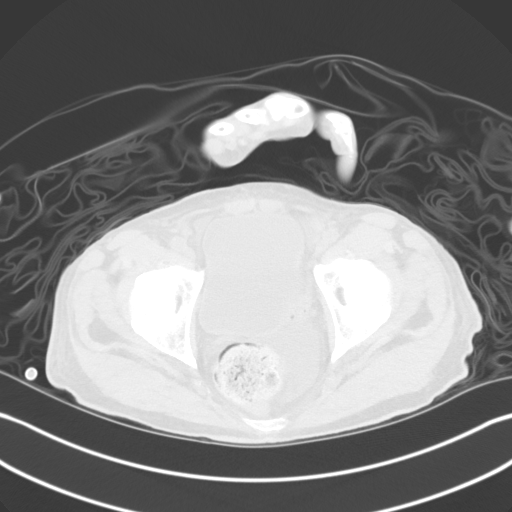
[im 44/132  lung]
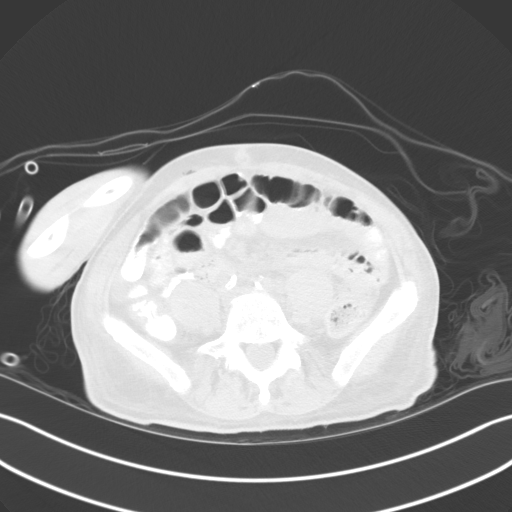
[im 55/132  lung]
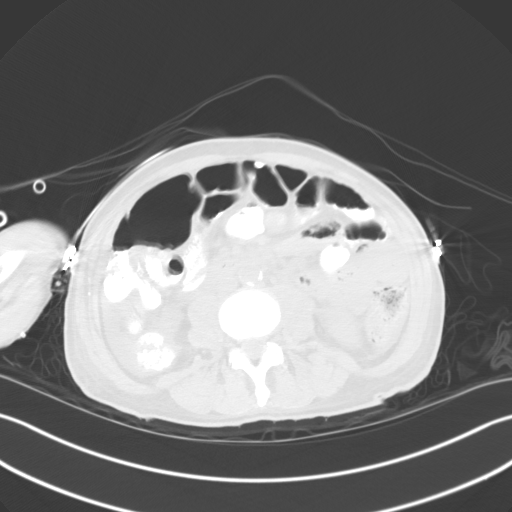
[im 66/132  mediastinal]
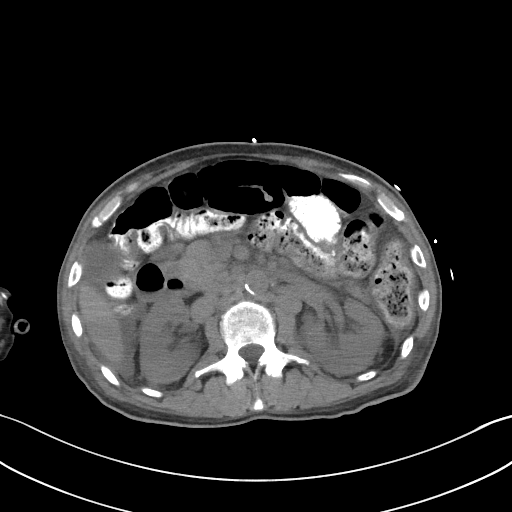
[im 66/132  lung]
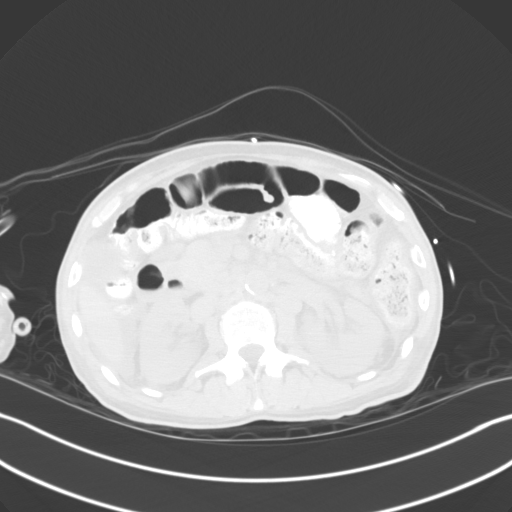
[im 77/132  lung]
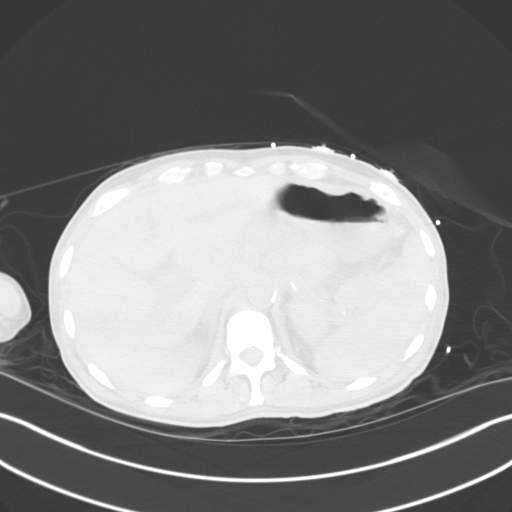
[im 88/132  lung]
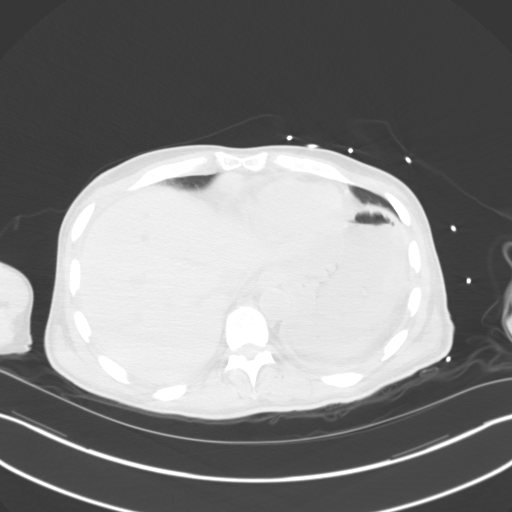
[im 110/132  lung]
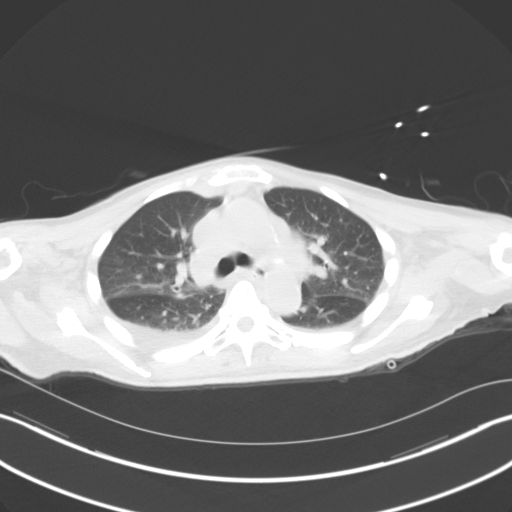
[im 121/132  mediastinal]
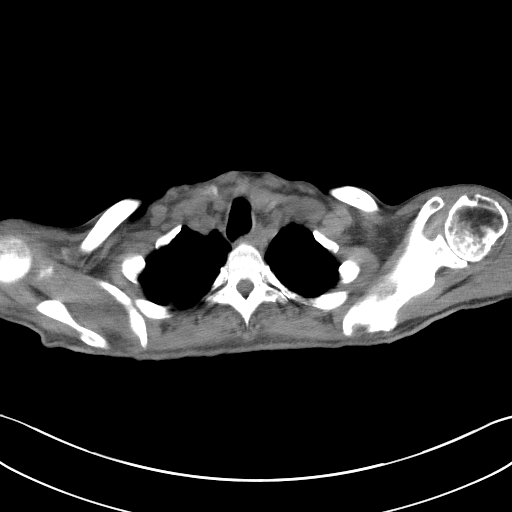
[im 121/132  lung]
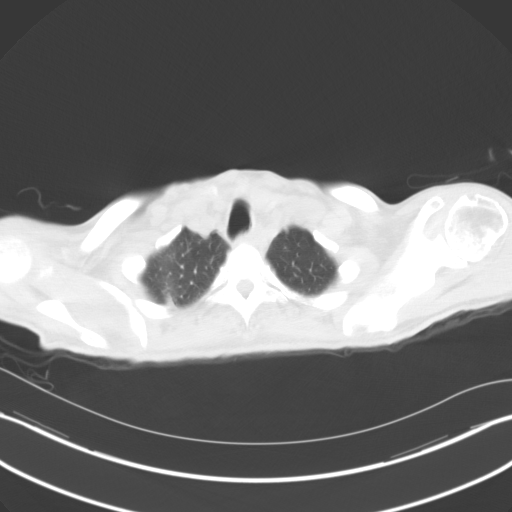

[Series 4: coronals · coronal · 0.82mm/px · 3 of 113 slices shown]
[im 23/113  lung]
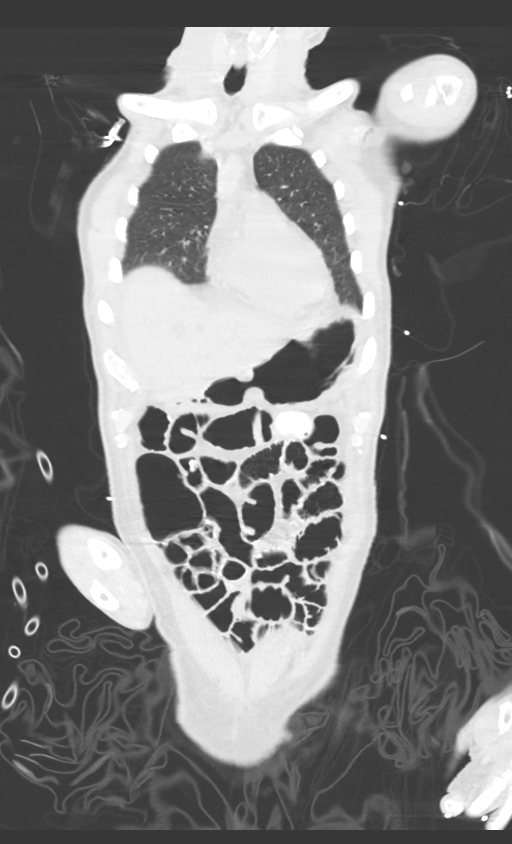
[im 45/113  lung]
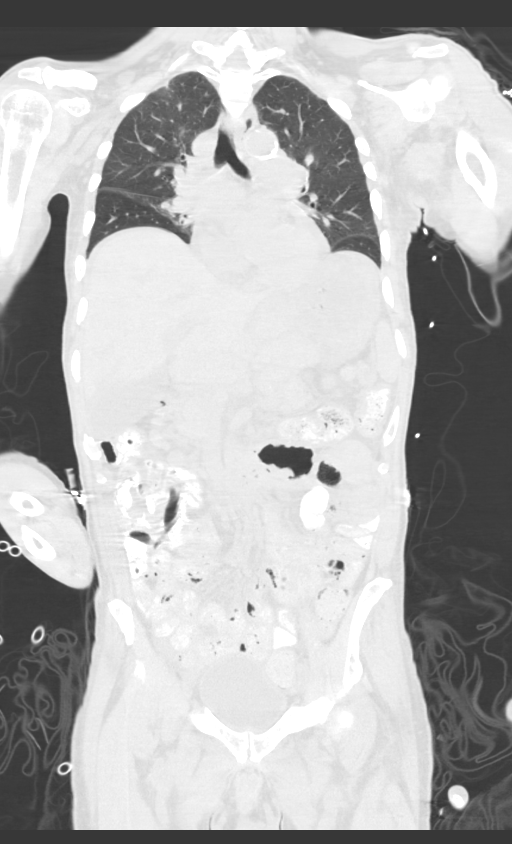
[im 68/113  lung]
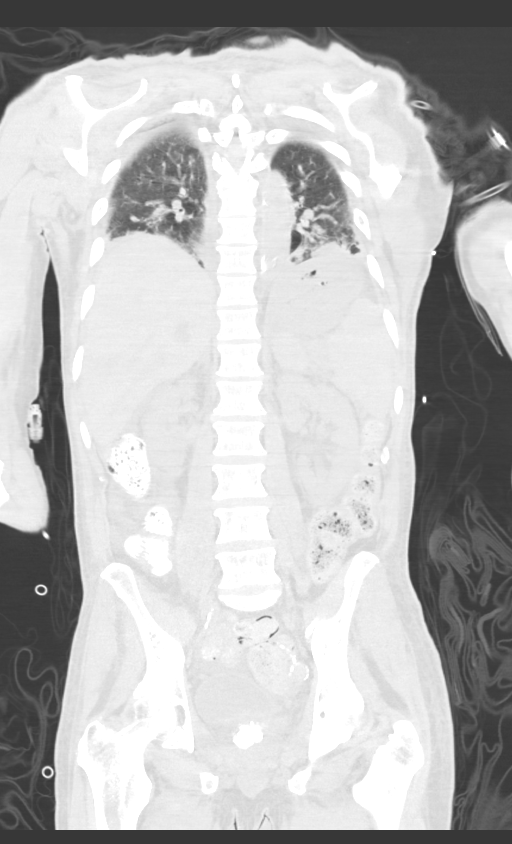

[12 of 36 positions shown; findings below may reference images not displayed]

FINDINGS: CT CHEST FINDINGS

Cardiovascular: The heart is normal in size. Small pericardial
effusion.

No evidence of thoracic aortic aneurysm. Atherosclerotic
calcifications of the aortic arch.

Mild three-vessel coronary atherosclerosis.

Mediastinum/Nodes: Mediastinal lymphadenopathy, poorly evaluated due
to lack of interest contrast administration, noting a dominant 15 mm
short axis subcarinal node (series 3/image 29). This previously
measured 11 mm.

Visualized thyroid is unremarkable.

Lungs/Pleura: Small bilateral pleural effusions, right greater than
left.

Associated patchy bilateral lower lobe opacities, favoring
atelectasis.

No frank interstitial edema.

Evaluation lung parenchyma is constrained by respiratory motion.
Within that constraint, there are no suspicious pulmonary nodules.

No pneumothorax.

Musculoskeletal: No focal osseous lesions.

CT ABDOMEN PELVIS FINDINGS

Hepatobiliary: Unenhanced liver is notable for scattered hepatic
cysts measuring up to 14 mm (series 3/image 1), benign.

Gallbladder is unremarkable. No intrahepatic or extrahepatic ductal
dilatation.

Pancreas: Within normal limits.

Spleen: Within normal limits.

Adrenals/Urinary Tract: Adrenal glands are within normal limits.

Bilateral renal cysts, measuring up to 2.2 cm in the right upper
kidney (series 3/image 60). No hydronephrosis.

Bladder is within normal limits.

Stomach/Bowel: Stomach is within normal limits.

No evidence of bowel obstruction.

Appendix is not discretely visualized.

Status post left hemicolectomy with suture line in the lower pelvis
(series 3/image 105).

Vascular/Lymphatic: No evidence of abdominal aortic aneurysm.

Atherosclerotic calcifications of the abdominal aorta and branch
vessels.

Bulky upper abdominal lymphadenopathy, poorly evaluated on
unenhanced CT but new from recent CTA, including a dominant 3.9 cm
node in the porta hepatis (series 3/image 58). Additional 2.4 cm
short axis node in the splenic hilum (series 3/image 9), new.

Para-aortic lymphadenopathy, including a dominant 16 mm short axis
node on the left (series 3/image 65), previously 12 mm.

Reproductive: Prostate is notable for dystrophic calcifications.

Other: Small volume abdominopelvic ascites.

Musculoskeletal: Visualized osseous structures are within normal
limits.
IMPRESSION: Limited evaluation due to lack of intravenous contrast
administration.

Bulky upper abdominal lymphadenopathy, new. Progressive mediastinal
and retroperitoneal lymphadenopathy. These are poorly evaluated but
favor progressive lymphoma.

Status post left hemicolectomy. No findings specific for recurrent
or metastatic disease.

Small bilateral pleural effusions, right greater than left. Small
volume abdominopelvic ascites.

## 2023-04-15 IMAGING — DX DG CHEST 1V PORT
1 series · 1 of 1 positions shown · non-contrast
Comparison: Chest radiographs 02/09/2022

CLINICAL DATA: Weakness.  History of CLL.

EXAM:
PORTABLE CHEST 1 VIEW

[chest ap]
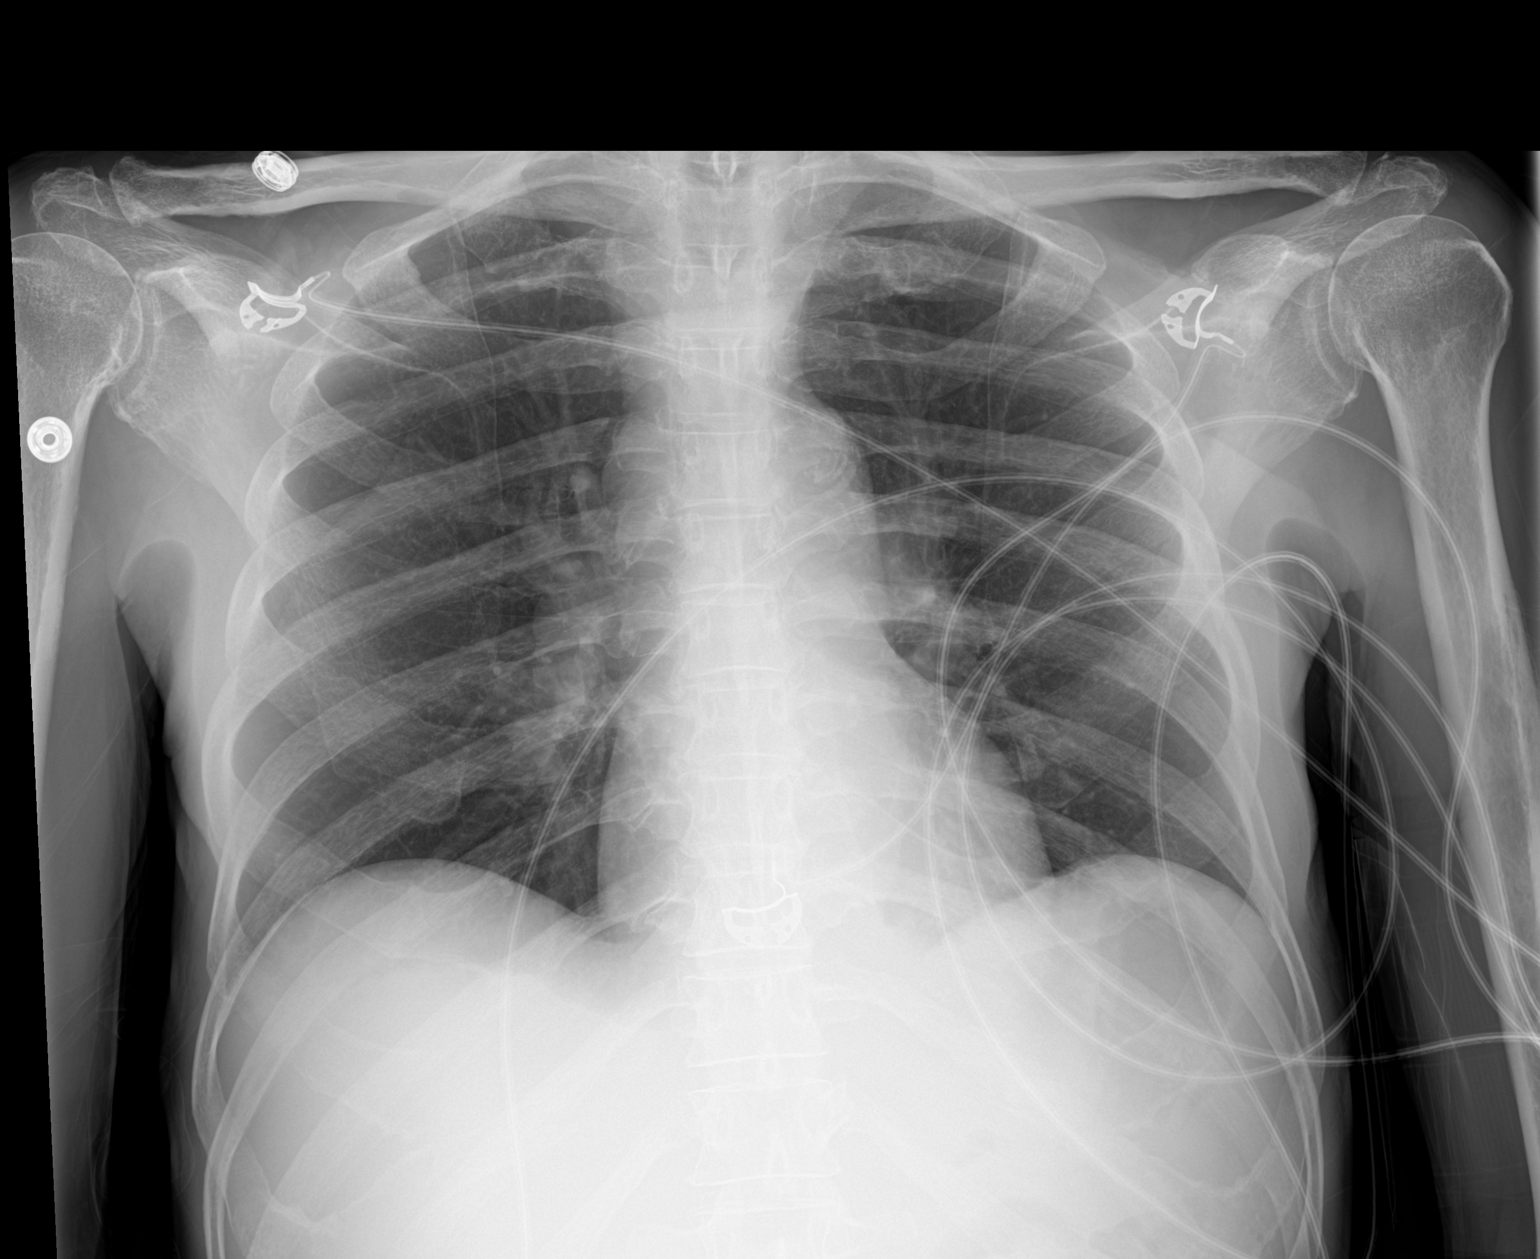

[1 of 1 positions shown; findings below may reference images not displayed]

FINDINGS: The cardiomediastinal silhouette is unchanged with normal heart
size. Aortic atherosclerosis is noted. No airspace consolidation,
edema, pleural effusion, or pneumothorax is identified. No acute
osseous abnormality is seen.
IMPRESSION: No active disease.

## 2023-04-16 IMAGING — DX DG ABD PORTABLE 1V
1 series · 1 of 1 positions shown · non-contrast
Comparison: 02/09/2022

CLINICAL DATA: NG tube placement

EXAM:
PORTABLE ABDOMEN - 1 VIEW

[abdomen kub]
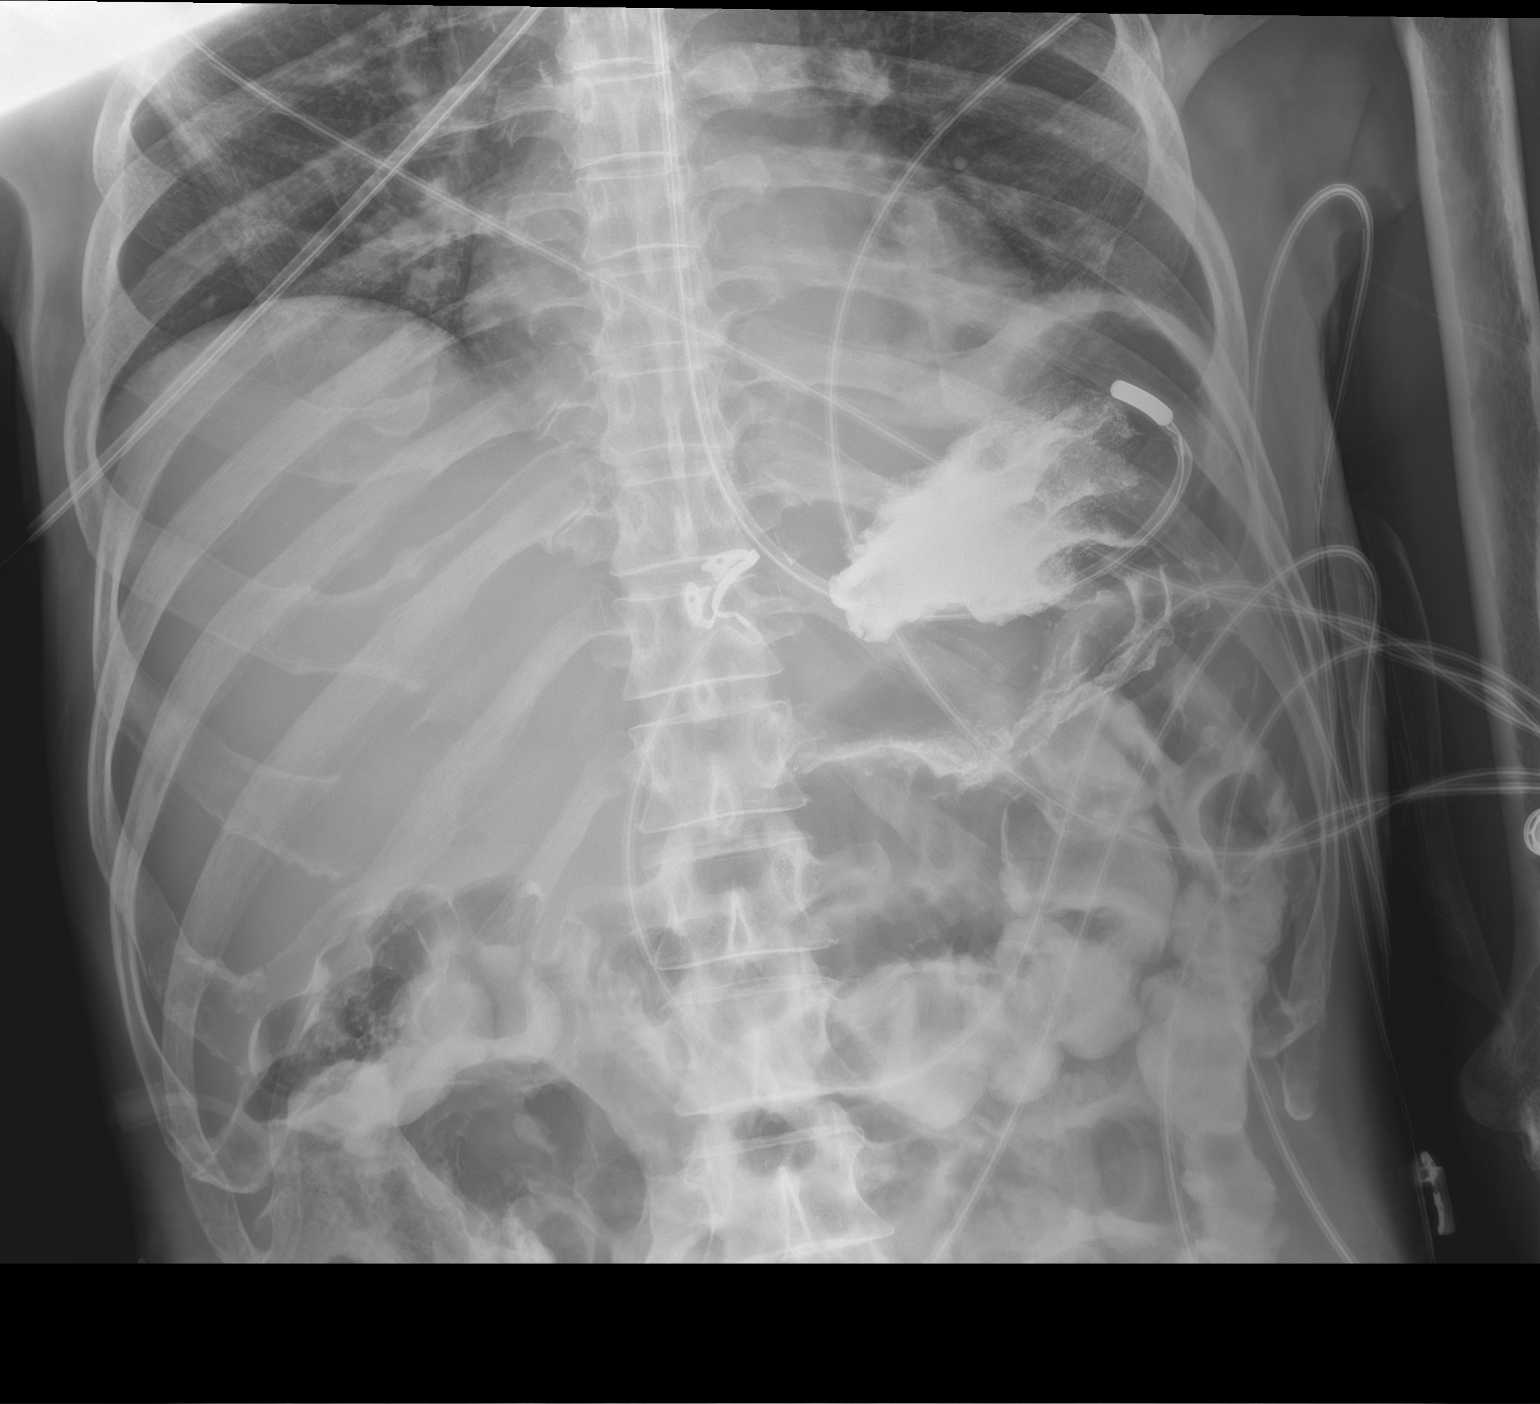

[1 of 1 positions shown; findings below may reference images not displayed]

FINDINGS: Esophageal tube tip overlies the proximal stomach. There is a small
amount of enteral contrast in the stomach and colon.
IMPRESSION: Esophageal tube tip overlies the gastric fundus

## 2023-05-03 ENCOUNTER — Other Ambulatory Visit: Payer: Self-pay
